# Patient Record
Sex: Female | Born: 1985
Health system: Southern US, Community
[De-identification: ages and names within clinical notes are randomized; demographics above are authoritative.]

## PROBLEM LIST (undated history)

## (undated) ENCOUNTER — Emergency Department (HOSPITAL_COMMUNITY): Admission: EM | Payer: Medicaid Other | Source: Home / Self Care

## (undated) DIAGNOSIS — M549 Dorsalgia, unspecified: Secondary | ICD-10-CM

## (undated) DIAGNOSIS — R339 Retention of urine, unspecified: Secondary | ICD-10-CM

## (undated) DIAGNOSIS — E46 Unspecified protein-calorie malnutrition: Secondary | ICD-10-CM

## (undated) DIAGNOSIS — R519 Headache, unspecified: Secondary | ICD-10-CM

## (undated) DIAGNOSIS — D62 Acute posthemorrhagic anemia: Secondary | ICD-10-CM

## (undated) DIAGNOSIS — F909 Attention-deficit hyperactivity disorder, unspecified type: Secondary | ICD-10-CM

## (undated) DIAGNOSIS — K5903 Drug induced constipation: Secondary | ICD-10-CM

## (undated) DIAGNOSIS — J3081 Allergic rhinitis due to animal (cat) (dog) hair and dander: Secondary | ICD-10-CM

## (undated) DIAGNOSIS — S32402A Unspecified fracture of left acetabulum, initial encounter for closed fracture: Secondary | ICD-10-CM

## (undated) DIAGNOSIS — G5772 Causalgia of left lower limb: Secondary | ICD-10-CM

## (undated) DIAGNOSIS — F419 Anxiety disorder, unspecified: Secondary | ICD-10-CM

## (undated) DIAGNOSIS — F319 Bipolar disorder, unspecified: Secondary | ICD-10-CM

## (undated) DIAGNOSIS — S2249XA Multiple fractures of ribs, unspecified side, initial encounter for closed fracture: Secondary | ICD-10-CM

## (undated) DIAGNOSIS — K219 Gastro-esophageal reflux disease without esophagitis: Secondary | ICD-10-CM

## (undated) DIAGNOSIS — G8918 Other acute postprocedural pain: Secondary | ICD-10-CM

## (undated) DIAGNOSIS — J189 Pneumonia, unspecified organism: Secondary | ICD-10-CM

## (undated) DIAGNOSIS — T07XXXA Unspecified multiple injuries, initial encounter: Secondary | ICD-10-CM

## (undated) DIAGNOSIS — T148XXA Other injury of unspecified body region, initial encounter: Secondary | ICD-10-CM

## (undated) DIAGNOSIS — S92014A Nondisplaced fracture of body of right calcaneus, initial encounter for closed fracture: Secondary | ICD-10-CM

## (undated) DIAGNOSIS — E8809 Other disorders of plasma-protein metabolism, not elsewhere classified: Secondary | ICD-10-CM

## (undated) DIAGNOSIS — S52009B Unspecified fracture of upper end of unspecified ulna, initial encounter for open fracture type I or II: Secondary | ICD-10-CM

## (undated) DIAGNOSIS — G8929 Other chronic pain: Secondary | ICD-10-CM

## (undated) DIAGNOSIS — G90522 Complex regional pain syndrome I of left lower limb: Secondary | ICD-10-CM

## (undated) DIAGNOSIS — J302 Other seasonal allergic rhinitis: Secondary | ICD-10-CM

## (undated) DIAGNOSIS — S52232A Displaced oblique fracture of shaft of left ulna, initial encounter for closed fracture: Secondary | ICD-10-CM

## (undated) DIAGNOSIS — M792 Neuralgia and neuritis, unspecified: Secondary | ICD-10-CM

## (undated) DIAGNOSIS — M1990 Unspecified osteoarthritis, unspecified site: Secondary | ICD-10-CM

## (undated) DIAGNOSIS — R51 Headache: Secondary | ICD-10-CM

## (undated) HISTORY — DX: Unspecified multiple injuries, initial encounter: T07.XXXA

## (undated) HISTORY — DX: Attention-deficit hyperactivity disorder, unspecified type: F90.9

## (undated) HISTORY — DX: Causalgia of left lower limb: G57.72

## (undated) HISTORY — DX: Complex regional pain syndrome i of left lower limb: G90.522

## (undated) HISTORY — DX: Multiple fractures of ribs, unspecified side, initial encounter for closed fracture: S22.49XA

## (undated) HISTORY — DX: Acute posthemorrhagic anemia: D62

## (undated) HISTORY — DX: Unspecified fracture of left acetabulum, initial encounter for closed fracture: S32.402A

## (undated) HISTORY — DX: Other acute postprocedural pain: G89.18

## (undated) HISTORY — DX: Drug induced constipation: K59.03

## (undated) HISTORY — DX: Unspecified fracture of upper end of unspecified ulna, initial encounter for open fracture type I or II: S52.009B

## (undated) HISTORY — DX: Neuralgia and neuritis, unspecified: M79.2

## (undated) HISTORY — DX: Other disorders of plasma-protein metabolism, not elsewhere classified: E88.09

## (undated) HISTORY — DX: Unspecified protein-calorie malnutrition: E46

## (undated) HISTORY — DX: Other injury of unspecified body region, initial encounter: T14.8XXA

## (undated) HISTORY — DX: Nondisplaced fracture of body of right calcaneus, initial encounter for closed fracture: S92.014A

## (undated) HISTORY — DX: Retention of urine, unspecified: R33.9

## (undated) HISTORY — DX: Displaced oblique fracture of shaft of left ulna, initial encounter for closed fracture: S52.232A

---
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
## 2003-01-11 ENCOUNTER — Inpatient Hospital Stay (HOSPITAL_COMMUNITY): Admission: AD | Admit: 2003-01-11 | Discharge: 2003-01-11 | Payer: Self-pay | Admitting: *Deleted

## 2003-01-15 ENCOUNTER — Inpatient Hospital Stay (HOSPITAL_COMMUNITY): Admission: AD | Admit: 2003-01-15 | Discharge: 2003-01-15 | Payer: Self-pay | Admitting: Family Medicine

## 2003-01-15 ENCOUNTER — Ambulatory Visit (HOSPITAL_COMMUNITY): Admission: RE | Admit: 2003-01-15 | Discharge: 2003-01-15 | Payer: Self-pay | Admitting: Family Medicine

## 2003-01-15 ENCOUNTER — Encounter: Payer: Self-pay | Admitting: Family Medicine

## 2003-01-16 ENCOUNTER — Ambulatory Visit (HOSPITAL_COMMUNITY): Admission: RE | Admit: 2003-01-16 | Discharge: 2003-01-16 | Payer: Self-pay | Admitting: *Deleted

## 2003-02-28 ENCOUNTER — Inpatient Hospital Stay (HOSPITAL_COMMUNITY): Admission: AD | Admit: 2003-02-28 | Discharge: 2003-02-28 | Payer: Self-pay | Admitting: Obstetrics and Gynecology

## 2003-03-01 ENCOUNTER — Encounter: Admission: RE | Admit: 2003-03-01 | Discharge: 2003-03-01 | Payer: Self-pay | Admitting: *Deleted

## 2003-03-01 ENCOUNTER — Ambulatory Visit (HOSPITAL_COMMUNITY): Admission: RE | Admit: 2003-03-01 | Discharge: 2003-03-01 | Payer: Self-pay | Admitting: *Deleted

## 2003-03-18 ENCOUNTER — Inpatient Hospital Stay (HOSPITAL_COMMUNITY): Admission: AD | Admit: 2003-03-18 | Discharge: 2003-03-18 | Payer: Self-pay | Admitting: *Deleted

## 2003-03-21 ENCOUNTER — Encounter: Admission: RE | Admit: 2003-03-21 | Discharge: 2003-03-21 | Payer: Self-pay | Admitting: *Deleted

## 2003-04-04 ENCOUNTER — Inpatient Hospital Stay (HOSPITAL_COMMUNITY): Admission: AD | Admit: 2003-04-04 | Discharge: 2003-04-04 | Payer: Self-pay | Admitting: Obstetrics and Gynecology

## 2003-04-04 ENCOUNTER — Encounter: Admission: RE | Admit: 2003-04-04 | Discharge: 2003-04-04 | Payer: Self-pay | Admitting: *Deleted

## 2003-04-09 ENCOUNTER — Ambulatory Visit (HOSPITAL_COMMUNITY): Admission: RE | Admit: 2003-04-09 | Discharge: 2003-04-09 | Payer: Self-pay | Admitting: *Deleted

## 2003-04-11 ENCOUNTER — Encounter: Admission: RE | Admit: 2003-04-11 | Discharge: 2003-04-11 | Payer: Self-pay | Admitting: *Deleted

## 2003-04-18 ENCOUNTER — Encounter: Admission: RE | Admit: 2003-04-18 | Discharge: 2003-04-18 | Payer: Self-pay | Admitting: *Deleted

## 2003-04-21 ENCOUNTER — Inpatient Hospital Stay (HOSPITAL_COMMUNITY): Admission: AD | Admit: 2003-04-21 | Discharge: 2003-04-21 | Payer: Self-pay | Admitting: Obstetrics & Gynecology

## 2003-04-25 ENCOUNTER — Encounter: Admission: RE | Admit: 2003-04-25 | Discharge: 2003-04-25 | Payer: Self-pay | Admitting: *Deleted

## 2003-04-25 ENCOUNTER — Ambulatory Visit (HOSPITAL_COMMUNITY): Admission: RE | Admit: 2003-04-25 | Discharge: 2003-04-25 | Payer: Self-pay | Admitting: *Deleted

## 2003-05-03 ENCOUNTER — Encounter: Admission: RE | Admit: 2003-05-03 | Discharge: 2003-05-03 | Payer: Self-pay | Admitting: *Deleted

## 2003-05-10 ENCOUNTER — Encounter: Admission: RE | Admit: 2003-05-10 | Discharge: 2003-05-10 | Payer: Self-pay | Admitting: *Deleted

## 2003-05-11 ENCOUNTER — Inpatient Hospital Stay (HOSPITAL_COMMUNITY): Admission: AD | Admit: 2003-05-11 | Discharge: 2003-05-11 | Payer: Self-pay | Admitting: *Deleted

## 2003-05-16 ENCOUNTER — Inpatient Hospital Stay (HOSPITAL_COMMUNITY): Admission: AD | Admit: 2003-05-16 | Discharge: 2003-05-16 | Payer: Self-pay | Admitting: Obstetrics and Gynecology

## 2003-05-17 ENCOUNTER — Encounter: Admission: RE | Admit: 2003-05-17 | Discharge: 2003-05-17 | Payer: Self-pay | Admitting: Family Medicine

## 2003-05-21 ENCOUNTER — Inpatient Hospital Stay (HOSPITAL_COMMUNITY): Admission: AD | Admit: 2003-05-21 | Discharge: 2003-05-21 | Payer: Self-pay | Admitting: Family Medicine

## 2003-05-23 ENCOUNTER — Encounter: Admission: RE | Admit: 2003-05-23 | Discharge: 2003-05-23 | Payer: Self-pay | Admitting: *Deleted

## 2003-05-30 ENCOUNTER — Encounter: Admission: RE | Admit: 2003-05-30 | Discharge: 2003-05-30 | Payer: Self-pay | Admitting: *Deleted

## 2003-05-31 ENCOUNTER — Inpatient Hospital Stay (HOSPITAL_COMMUNITY): Admission: AD | Admit: 2003-05-31 | Discharge: 2003-06-02 | Payer: Self-pay | Admitting: Obstetrics & Gynecology

## 2003-11-04 ENCOUNTER — Inpatient Hospital Stay (HOSPITAL_COMMUNITY): Admission: AD | Admit: 2003-11-04 | Discharge: 2003-11-04 | Payer: Self-pay | Admitting: Family Medicine

## 2003-12-23 ENCOUNTER — Emergency Department (HOSPITAL_COMMUNITY): Admission: EM | Admit: 2003-12-23 | Discharge: 2003-12-23 | Payer: Self-pay | Admitting: Emergency Medicine

## 2004-01-17 ENCOUNTER — Inpatient Hospital Stay (HOSPITAL_COMMUNITY): Admission: AD | Admit: 2004-01-17 | Discharge: 2004-01-17 | Payer: Self-pay | Admitting: Obstetrics & Gynecology

## 2004-02-21 ENCOUNTER — Emergency Department (HOSPITAL_COMMUNITY): Admission: EM | Admit: 2004-02-21 | Discharge: 2004-02-21 | Payer: Self-pay | Admitting: Family Medicine

## 2004-02-26 ENCOUNTER — Emergency Department (HOSPITAL_COMMUNITY): Admission: EM | Admit: 2004-02-26 | Discharge: 2004-02-26 | Payer: Self-pay | Admitting: Emergency Medicine

## 2004-03-26 ENCOUNTER — Inpatient Hospital Stay (HOSPITAL_COMMUNITY): Admission: AD | Admit: 2004-03-26 | Discharge: 2004-03-27 | Payer: Self-pay | Admitting: *Deleted

## 2004-07-29 ENCOUNTER — Emergency Department (HOSPITAL_COMMUNITY): Admission: EM | Admit: 2004-07-29 | Discharge: 2004-07-29 | Payer: Self-pay | Admitting: Family Medicine

## 2004-08-05 ENCOUNTER — Inpatient Hospital Stay (HOSPITAL_COMMUNITY): Admission: AD | Admit: 2004-08-05 | Discharge: 2004-08-05 | Payer: Self-pay | Admitting: *Deleted

## 2004-10-13 ENCOUNTER — Inpatient Hospital Stay (HOSPITAL_COMMUNITY): Admission: AD | Admit: 2004-10-13 | Discharge: 2004-10-13 | Payer: Self-pay | Admitting: Family Medicine

## 2004-10-15 ENCOUNTER — Emergency Department (HOSPITAL_COMMUNITY): Admission: EM | Admit: 2004-10-15 | Discharge: 2004-10-16 | Payer: Self-pay | Admitting: Emergency Medicine

## 2005-01-17 ENCOUNTER — Emergency Department (HOSPITAL_COMMUNITY): Admission: EM | Admit: 2005-01-17 | Discharge: 2005-01-17 | Payer: Self-pay | Admitting: Emergency Medicine

## 2005-04-09 ENCOUNTER — Emergency Department (HOSPITAL_COMMUNITY): Admission: EM | Admit: 2005-04-09 | Discharge: 2005-04-09 | Payer: Self-pay | Admitting: Emergency Medicine

## 2005-07-19 ENCOUNTER — Emergency Department (HOSPITAL_COMMUNITY): Admission: EM | Admit: 2005-07-19 | Discharge: 2005-07-19 | Payer: Self-pay | Admitting: Emergency Medicine

## 2005-08-03 ENCOUNTER — Inpatient Hospital Stay (HOSPITAL_COMMUNITY): Admission: AD | Admit: 2005-08-03 | Discharge: 2005-08-03 | Payer: Self-pay | Admitting: *Deleted

## 2005-09-09 ENCOUNTER — Inpatient Hospital Stay (HOSPITAL_COMMUNITY): Admission: AD | Admit: 2005-09-09 | Discharge: 2005-09-09 | Payer: Self-pay | Admitting: Gynecology

## 2005-10-27 ENCOUNTER — Inpatient Hospital Stay (HOSPITAL_COMMUNITY): Admission: AD | Admit: 2005-10-27 | Discharge: 2005-10-27 | Payer: Self-pay | Admitting: Obstetrics and Gynecology

## 2005-10-31 ENCOUNTER — Inpatient Hospital Stay (HOSPITAL_COMMUNITY): Admission: AD | Admit: 2005-10-31 | Discharge: 2005-10-31 | Payer: Self-pay | Admitting: Gynecology

## 2005-12-02 ENCOUNTER — Inpatient Hospital Stay (HOSPITAL_COMMUNITY): Admission: AD | Admit: 2005-12-02 | Discharge: 2005-12-02 | Payer: Self-pay | Admitting: Gynecology

## 2006-02-12 ENCOUNTER — Inpatient Hospital Stay (HOSPITAL_COMMUNITY): Admission: AD | Admit: 2006-02-12 | Discharge: 2006-02-12 | Payer: Self-pay | Admitting: Obstetrics and Gynecology

## 2006-05-06 ENCOUNTER — Inpatient Hospital Stay (HOSPITAL_COMMUNITY): Admission: AD | Admit: 2006-05-06 | Discharge: 2006-05-06 | Payer: Self-pay | Admitting: Obstetrics & Gynecology

## 2006-05-29 ENCOUNTER — Emergency Department (HOSPITAL_COMMUNITY): Admission: EM | Admit: 2006-05-29 | Discharge: 2006-05-29 | Payer: Self-pay | Admitting: Emergency Medicine

## 2006-08-13 ENCOUNTER — Emergency Department (HOSPITAL_COMMUNITY): Admission: EM | Admit: 2006-08-13 | Discharge: 2006-08-13 | Payer: Self-pay | Admitting: Emergency Medicine

## 2006-09-10 ENCOUNTER — Encounter: Admission: RE | Admit: 2006-09-10 | Discharge: 2006-09-10 | Payer: Self-pay | Admitting: Orthopedic Surgery

## 2006-10-21 ENCOUNTER — Inpatient Hospital Stay (HOSPITAL_COMMUNITY): Admission: AD | Admit: 2006-10-21 | Discharge: 2006-10-21 | Payer: Self-pay | Admitting: Obstetrics & Gynecology

## 2007-04-20 ENCOUNTER — Emergency Department (HOSPITAL_COMMUNITY): Admission: EM | Admit: 2007-04-20 | Discharge: 2007-04-20 | Payer: Self-pay | Admitting: Emergency Medicine

## 2007-06-21 ENCOUNTER — Emergency Department (HOSPITAL_COMMUNITY): Admission: EM | Admit: 2007-06-21 | Discharge: 2007-06-21 | Payer: Self-pay | Admitting: Emergency Medicine

## 2007-07-23 ENCOUNTER — Emergency Department (HOSPITAL_COMMUNITY): Admission: EM | Admit: 2007-07-23 | Discharge: 2007-07-23 | Payer: Self-pay | Admitting: Emergency Medicine

## 2007-07-28 ENCOUNTER — Inpatient Hospital Stay (HOSPITAL_COMMUNITY): Admission: AD | Admit: 2007-07-28 | Discharge: 2007-07-28 | Payer: Self-pay | Admitting: Obstetrics and Gynecology

## 2007-08-08 ENCOUNTER — Emergency Department (HOSPITAL_COMMUNITY): Admission: EM | Admit: 2007-08-08 | Discharge: 2007-08-08 | Payer: Self-pay | Admitting: Emergency Medicine

## 2007-08-15 ENCOUNTER — Inpatient Hospital Stay (HOSPITAL_COMMUNITY): Admission: AD | Admit: 2007-08-15 | Discharge: 2007-08-15 | Payer: Self-pay | Admitting: Obstetrics and Gynecology

## 2007-08-24 ENCOUNTER — Emergency Department (HOSPITAL_COMMUNITY): Admission: EM | Admit: 2007-08-24 | Discharge: 2007-08-24 | Payer: Self-pay | Admitting: Emergency Medicine

## 2007-10-16 ENCOUNTER — Inpatient Hospital Stay (HOSPITAL_COMMUNITY): Admission: AD | Admit: 2007-10-16 | Discharge: 2007-10-16 | Payer: Self-pay | Admitting: Obstetrics & Gynecology

## 2007-10-26 ENCOUNTER — Ambulatory Visit (HOSPITAL_COMMUNITY): Admission: RE | Admit: 2007-10-26 | Discharge: 2007-10-26 | Payer: Self-pay | Admitting: Obstetrics & Gynecology

## 2007-11-10 ENCOUNTER — Ambulatory Visit (HOSPITAL_COMMUNITY): Admission: RE | Admit: 2007-11-10 | Discharge: 2007-11-10 | Payer: Self-pay | Admitting: Obstetrics & Gynecology

## 2007-12-23 ENCOUNTER — Inpatient Hospital Stay (HOSPITAL_COMMUNITY): Admission: AD | Admit: 2007-12-23 | Discharge: 2007-12-23 | Payer: Self-pay | Admitting: Psychiatry

## 2007-12-27 ENCOUNTER — Inpatient Hospital Stay (HOSPITAL_COMMUNITY): Admission: AD | Admit: 2007-12-27 | Discharge: 2007-12-28 | Payer: Self-pay | Admitting: Obstetrics

## 2008-01-12 ENCOUNTER — Other Ambulatory Visit: Payer: Self-pay | Admitting: Emergency Medicine

## 2008-01-13 ENCOUNTER — Inpatient Hospital Stay (HOSPITAL_COMMUNITY): Admission: AD | Admit: 2008-01-13 | Discharge: 2008-01-19 | Payer: Self-pay | Admitting: Obstetrics

## 2008-03-07 ENCOUNTER — Inpatient Hospital Stay (HOSPITAL_COMMUNITY): Admission: AD | Admit: 2008-03-07 | Discharge: 2008-03-07 | Payer: Self-pay | Admitting: Obstetrics

## 2008-03-15 ENCOUNTER — Inpatient Hospital Stay (HOSPITAL_COMMUNITY): Admission: AD | Admit: 2008-03-15 | Discharge: 2008-03-15 | Payer: Self-pay | Admitting: Obstetrics

## 2008-03-20 ENCOUNTER — Inpatient Hospital Stay (HOSPITAL_COMMUNITY): Admission: AD | Admit: 2008-03-20 | Discharge: 2008-03-23 | Payer: Self-pay | Admitting: Obstetrics

## 2008-03-29 ENCOUNTER — Emergency Department (HOSPITAL_COMMUNITY): Admission: EM | Admit: 2008-03-29 | Discharge: 2008-03-29 | Payer: Self-pay | Admitting: Emergency Medicine

## 2008-04-06 ENCOUNTER — Emergency Department (HOSPITAL_COMMUNITY): Admission: EM | Admit: 2008-04-06 | Discharge: 2008-04-06 | Payer: Self-pay | Admitting: Emergency Medicine

## 2008-04-07 ENCOUNTER — Emergency Department (HOSPITAL_COMMUNITY): Admission: EM | Admit: 2008-04-07 | Discharge: 2008-04-07 | Payer: Self-pay | Admitting: Emergency Medicine

## 2008-04-13 HISTORY — PX: CHOLECYSTECTOMY: SHX55

## 2008-05-02 ENCOUNTER — Emergency Department (HOSPITAL_COMMUNITY): Admission: EM | Admit: 2008-05-02 | Discharge: 2008-05-03 | Payer: Self-pay | Admitting: Emergency Medicine

## 2008-05-22 ENCOUNTER — Encounter (INDEPENDENT_AMBULATORY_CARE_PROVIDER_SITE_OTHER): Payer: Self-pay | Admitting: General Surgery

## 2008-05-22 ENCOUNTER — Ambulatory Visit (HOSPITAL_COMMUNITY): Admission: RE | Admit: 2008-05-22 | Discharge: 2008-05-23 | Payer: Self-pay | Admitting: General Surgery

## 2008-07-31 ENCOUNTER — Ambulatory Visit (HOSPITAL_COMMUNITY): Admission: RE | Admit: 2008-07-31 | Discharge: 2008-07-31 | Payer: Self-pay | Admitting: Obstetrics

## 2008-10-21 ENCOUNTER — Emergency Department (HOSPITAL_COMMUNITY): Admission: EM | Admit: 2008-10-21 | Discharge: 2008-10-21 | Payer: Self-pay | Admitting: Emergency Medicine

## 2008-11-17 ENCOUNTER — Emergency Department (HOSPITAL_COMMUNITY): Admission: EM | Admit: 2008-11-17 | Discharge: 2008-11-17 | Payer: Self-pay | Admitting: Emergency Medicine

## 2008-12-20 ENCOUNTER — Emergency Department (HOSPITAL_COMMUNITY): Admission: EM | Admit: 2008-12-20 | Discharge: 2008-12-20 | Payer: Self-pay | Admitting: Emergency Medicine

## 2009-01-10 ENCOUNTER — Inpatient Hospital Stay (HOSPITAL_COMMUNITY): Admission: AD | Admit: 2009-01-10 | Discharge: 2009-01-10 | Payer: Self-pay | Admitting: Psychiatry

## 2009-02-21 ENCOUNTER — Emergency Department (HOSPITAL_COMMUNITY): Admission: EM | Admit: 2009-02-21 | Discharge: 2009-02-21 | Payer: Self-pay | Admitting: Emergency Medicine

## 2009-02-24 ENCOUNTER — Emergency Department (HOSPITAL_COMMUNITY): Admission: EM | Admit: 2009-02-24 | Discharge: 2009-02-24 | Payer: Self-pay | Admitting: Emergency Medicine

## 2009-09-28 ENCOUNTER — Emergency Department (HOSPITAL_COMMUNITY): Admission: EM | Admit: 2009-09-28 | Discharge: 2009-09-28 | Payer: Self-pay | Admitting: Emergency Medicine

## 2010-05-04 ENCOUNTER — Encounter: Payer: Self-pay | Admitting: Orthopedic Surgery

## 2010-05-04 ENCOUNTER — Encounter: Payer: Self-pay | Admitting: Obstetrics

## 2010-05-05 ENCOUNTER — Encounter: Payer: Self-pay | Admitting: Obstetrics

## 2010-05-27 ENCOUNTER — Emergency Department (HOSPITAL_COMMUNITY): Payer: Medicaid Other

## 2010-05-27 ENCOUNTER — Emergency Department (HOSPITAL_COMMUNITY)
Admission: EM | Admit: 2010-05-27 | Discharge: 2010-05-28 | Disposition: A | Discharge status: Home / Self Care | Payer: Medicaid Other | Attending: Emergency Medicine | Admitting: Emergency Medicine

## 2010-05-27 DIAGNOSIS — F17200 Nicotine dependence, unspecified, uncomplicated: Secondary | ICD-10-CM | POA: Insufficient documentation

## 2010-05-27 DIAGNOSIS — J40 Bronchitis, not specified as acute or chronic: Secondary | ICD-10-CM | POA: Insufficient documentation

## 2010-05-27 DIAGNOSIS — R0602 Shortness of breath: Secondary | ICD-10-CM | POA: Insufficient documentation

## 2010-06-29 LAB — POCT PREGNANCY, URINE: Preg Test, Ur: NEGATIVE

## 2010-06-29 LAB — CBC
HCT: 42.7 % (ref 36.0–46.0)
Hemoglobin: 14.7 g/dL (ref 12.0–15.0)
MCHC: 34.4 g/dL (ref 30.0–36.0)
MCV: 91.4 fL (ref 78.0–100.0)
Platelets: 290 10*3/uL (ref 150–400)
RBC: 4.67 MIL/uL (ref 3.87–5.11)
RDW: 13.7 % (ref 11.5–15.5)
WBC: 5.8 10*3/uL (ref 4.0–10.5)

## 2010-06-29 LAB — URINALYSIS, ROUTINE W REFLEX MICROSCOPIC
Bilirubin Urine: NEGATIVE
Bilirubin Urine: NEGATIVE
Glucose, UA: NEGATIVE mg/dL
Glucose, UA: NEGATIVE mg/dL
Hgb urine dipstick: NEGATIVE
Ketones, ur: NEGATIVE mg/dL
Ketones, ur: NEGATIVE mg/dL
Nitrite: NEGATIVE
Nitrite: NEGATIVE
Protein, ur: NEGATIVE mg/dL
Protein, ur: NEGATIVE mg/dL
Specific Gravity, Urine: 1.024 (ref 1.005–1.030)
Specific Gravity, Urine: 1.031 — ABNORMAL HIGH (ref 1.005–1.030)
Urobilinogen, UA: 0.2 mg/dL (ref 0.0–1.0)
Urobilinogen, UA: 1 mg/dL (ref 0.0–1.0)
pH: 5.5 (ref 5.0–8.0)
pH: 6 (ref 5.0–8.0)

## 2010-06-29 LAB — COMPREHENSIVE METABOLIC PANEL
ALT: 24 U/L (ref 0–35)
AST: 31 U/L (ref 0–37)
Albumin: 4 g/dL (ref 3.5–5.2)
Alkaline Phosphatase: 64 U/L (ref 39–117)
BUN: 12 mg/dL (ref 6–23)
CO2: 23 mEq/L (ref 19–32)
Calcium: 9.3 mg/dL (ref 8.4–10.5)
Chloride: 110 mEq/L (ref 96–112)
Creatinine, Ser: 0.75 mg/dL (ref 0.4–1.2)
GFR calc Af Amer: >60 mL/min (ref >60)
GFR calc non Af Amer: >60 mL/min (ref >60)
Glucose, Bld: 115 mg/dL — ABNORMAL HIGH (ref 70–99)
Potassium: 3.9 mEq/L (ref 3.5–5.1)
Sodium: 141 mEq/L (ref 135–145)
Total Bilirubin: 0.5 mg/dL (ref 0.3–1.2)
Total Protein: 7.4 g/dL (ref 6.0–8.3)

## 2010-06-29 LAB — GC/CHLAMYDIA PROBE AMP, GENITAL
Chlamydia, DNA Probe: NEGATIVE
GC Probe Amp, Genital: NEGATIVE

## 2010-06-29 LAB — URINE MICROSCOPIC-ADD ON

## 2010-06-29 LAB — WET PREP, GENITAL
Trich, Wet Prep: NONE SEEN
WBC, Wet Prep HPF POC: NONE SEEN
Yeast Wet Prep HPF POC: NONE SEEN

## 2010-06-29 LAB — DIFFERENTIAL
Basophils Absolute: 0 10*3/uL (ref 0.0–0.1)
Basophils Relative: 0 % (ref 0–1)
Eosinophils Absolute: 0.2 10*3/uL (ref 0.0–0.7)
Eosinophils Relative: 3 % (ref 0–5)
Lymphocytes Relative: 27 % (ref 12–46)
Lymphs Abs: 1.6 10*3/uL (ref 0.7–4.0)
Monocytes Absolute: 0.6 10*3/uL (ref 0.1–1.0)
Monocytes Relative: 10 % (ref 3–12)
Neutro Abs: 3.5 10*3/uL (ref 1.7–7.7)
Neutrophils Relative %: 60 % (ref 43–77)

## 2010-07-16 LAB — RAPID STREP SCREEN (MED CTR MEBANE ONLY)
Streptococcus, Group A Screen (Direct): NEGATIVE
Streptococcus, Group A Screen (Direct): NEGATIVE

## 2010-07-18 LAB — URINALYSIS, ROUTINE W REFLEX MICROSCOPIC
Bilirubin Urine: NEGATIVE
Glucose, UA: NEGATIVE mg/dL
Hgb urine dipstick: NEGATIVE
Ketones, ur: NEGATIVE mg/dL
Nitrite: NEGATIVE
Protein, ur: NEGATIVE mg/dL
Specific Gravity, Urine: >1.03 — ABNORMAL HIGH (ref 1.005–1.030)
Urobilinogen, UA: 0.2 mg/dL (ref 0.0–1.0)
pH: 5 (ref 5.0–8.0)

## 2010-07-18 LAB — GC/CHLAMYDIA PROBE AMP, GENITAL
Chlamydia, DNA Probe: NEGATIVE
GC Probe Amp, Genital: NEGATIVE

## 2010-07-18 LAB — CBC
HCT: 43.1 % (ref 36.0–46.0)
Hemoglobin: 14.6 g/dL (ref 12.0–15.0)
MCHC: 33.9 g/dL (ref 30.0–36.0)
MCV: 92.3 fL (ref 78.0–100.0)
Platelets: 268 10*3/uL (ref 150–400)
RBC: 4.67 MIL/uL (ref 3.87–5.11)
RDW: 13.5 % (ref 11.5–15.5)
WBC: 6.2 10*3/uL (ref 4.0–10.5)

## 2010-07-18 LAB — WET PREP, GENITAL
Clue Cells Wet Prep HPF POC: NONE SEEN
Trich, Wet Prep: NONE SEEN
Yeast Wet Prep HPF POC: NONE SEEN

## 2010-07-18 LAB — POCT PREGNANCY, URINE: Preg Test, Ur: NEGATIVE

## 2010-07-28 LAB — URINALYSIS, ROUTINE W REFLEX MICROSCOPIC
Bilirubin Urine: NEGATIVE
Glucose, UA: NEGATIVE mg/dL
Hgb urine dipstick: NEGATIVE
Ketones, ur: NEGATIVE mg/dL
Nitrite: NEGATIVE
Protein, ur: NEGATIVE mg/dL
Specific Gravity, Urine: 1.02 (ref 1.005–1.030)
Urobilinogen, UA: 0.2 mg/dL (ref 0.0–1.0)
pH: 6 (ref 5.0–8.0)

## 2010-07-28 LAB — CBC
HCT: 42.7 % (ref 36.0–46.0)
Hemoglobin: 14 g/dL (ref 12.0–15.0)
MCHC: 32.8 g/dL (ref 30.0–36.0)
MCV: 89.2 fL (ref 78.0–100.0)
Platelets: 316 10*3/uL (ref 150–400)
RBC: 4.78 MIL/uL (ref 3.87–5.11)
RDW: 13.9 % (ref 11.5–15.5)
WBC: 8.6 10*3/uL (ref 4.0–10.5)

## 2010-07-28 LAB — DIFFERENTIAL
Basophils Absolute: 0 10*3/uL (ref 0.0–0.1)
Basophils Relative: 0 % (ref 0–1)
Eosinophils Absolute: 0.5 10*3/uL (ref 0.0–0.7)
Eosinophils Relative: 6 % — ABNORMAL HIGH (ref 0–5)
Lymphocytes Relative: 37 % (ref 12–46)
Lymphs Abs: 3.2 10*3/uL (ref 0.7–4.0)
Monocytes Absolute: 0.8 10*3/uL (ref 0.1–1.0)
Monocytes Relative: 10 % (ref 3–12)
Neutro Abs: 4 10*3/uL (ref 1.7–7.7)
Neutrophils Relative %: 47 % (ref 43–77)

## 2010-07-28 LAB — COMPREHENSIVE METABOLIC PANEL
ALT: 77 U/L — ABNORMAL HIGH (ref 0–35)
AST: 51 U/L — ABNORMAL HIGH (ref 0–37)
Albumin: 4 g/dL (ref 3.5–5.2)
Alkaline Phosphatase: 61 U/L (ref 39–117)
BUN: 15 mg/dL (ref 6–23)
CO2: 25 mEq/L (ref 19–32)
Calcium: 9.8 mg/dL (ref 8.4–10.5)
Chloride: 110 mEq/L (ref 96–112)
Creatinine, Ser: 0.8 mg/dL (ref 0.4–1.2)
GFR calc Af Amer: >60 mL/min (ref >60)
GFR calc non Af Amer: >60 mL/min (ref >60)
Glucose, Bld: 93 mg/dL (ref 70–99)
Potassium: 4.2 mEq/L (ref 3.5–5.1)
Sodium: 141 mEq/L (ref 135–145)
Total Bilirubin: 0.4 mg/dL (ref 0.3–1.2)
Total Protein: 7.4 g/dL (ref 6.0–8.3)

## 2010-07-28 LAB — URINE CULTURE
Colony Count: NO GROWTH
Culture: NO GROWTH

## 2010-07-28 LAB — URINE MICROSCOPIC-ADD ON

## 2010-07-28 LAB — LIPASE, BLOOD: Lipase: 28 U/L (ref 11–59)

## 2010-07-29 LAB — HEMOGLOBIN AND HEMATOCRIT, BLOOD
HCT: 40 % (ref 36.0–46.0)
Hemoglobin: 13.7 g/dL (ref 12.0–15.0)

## 2010-07-29 LAB — PREGNANCY, URINE: Preg Test, Ur: NEGATIVE

## 2010-08-26 NOTE — Op Note (Signed)
NAMELAURELLA, Marissa Smith                 ACCOUNT NO.:  [id number]   MEDICAL RECORD NO.:  [id number]          PATIENT TYPE:  AMB   LOCATION:  DAY                          FACILITY:  [facility]   PHYSICIAN:  Ollen Gross. Vernell Morgans, M.D. DATE OF BIRTH:  11/19/1985   DATE OF PROCEDURE:  05/22/2008  DATE OF DISCHARGE:                               OPERATIVE REPORT   PREOPERATIVE DIAGNOSES:  Gallstones.   POSTOPERATIVE DIAGNOSIS:  Gallstones.   PROCEDURES:  Laparoscopic cholecystectomy with intraoperative  cholangiogram.   SURGEON:  Ollen Gross. Vernell Morgans, M.D.   ASSISTANT:  Anselm Pancoast. Zachery Dakins, M.D.   ANESTHESIA:  General endotracheal.   PROCEDURE:  After informed consent was obtained, the patient was brought  to the operating room and placed in supine position on the operating  table.  After adequate induction of general anesthesia, the patient's  abdomen was prepped with Betadine and draped in usual sterile manner.  The area above the umbilicus was infiltrated with 0.25% Marcaine.  A  small incision was made with 15 blade knife.  This incision was carried  down through the subcutaneous tissue bluntly with a hemostat and [employer] retractors until the linea alba was identified.  The linea alba was  incised with a 15 blade knife and each side was grasped with Kocher  clamps and elevated anteriorly.  The preperitoneal space was then probed  bluntly with a hemostat until the peritoneum was opened and access was  gained to the abdominal cavity.  The 0 Vicryl pursestring stitch was  placed in the fascia surrounding the opening.  The Hasson cannula was  placed through the opening and anchored in place with the previously  placed Vicryl pursestring stitch.  The abdomen was then insufflated with  carbon dioxide without difficulty.  The patient was placed in reverse  Trendelenburg position and rotated with the right side up.  A  laparoscope was inserted through the Hasson cannula and the right upper  quadrant was inspected. The dome of gallbladder and liver readily  identified.  Next, the epigastric region was infiltrated with 0.25%  Marcaine.  A small incision was made with a 15 blade knife.  A 10-mm  port was placed bluntly through this incision into the abdominal cavity  under direct vision.  Sites were then chosen laterally on the right side  of the abdomen for placement of 5-mm ports.  Each of these areas was  infiltrated with 0.25% Marcaine.  Small stab incisions were made with a  15 blade knife and 5 mm ports were placed bluntly through these  incisions into the abdominal cavity under direct vision.  A blunt  grasper was placed through the lateral-most 5-mm port and used to grasp  the dome of gallbladder and elevate it anteriorly and superiorly.  Another blunt grasper was placed through the other 5-mm port and used to  retract on the body and neck of the gallbladder.  A dissector was placed  through the epigastric port and using electrocautery, the peritoneal  reflection at the gallbladder neck area was opened.  Blunt dissection  was  then carried out in this area until the gallbladder neck cystic duct  junction was readily identified and a good window was created.  A single  clip was placed on the gallbladder neck.  A small ductotomy was made  just below the clip with a laparoscopic scissors.  A 14 gauge angiocath  was placed percutaneously through the anterior abdominal wall under  direct vision.  A Reddick cholangiogram catheter was placed through the  angiocath and flushed.  The Reddick catheter then placed in the cystic  duct and anchored in place with the clip.  A cholangiogram was obtained  that showed no filling defects, good emptying in duodenum and adequate  length on the cystic duct.  The anchoring clip and catheters were  removed from the patient.  Three clips were placed proximally on the  cystic duct and duct was divided between the two.  Next, posterior to  this,  the cystic artery was identified with a couple of branch points.  These were all dissected bluntly in a circumferential manner until a  good window was created.  Two clips placed proximally and distally on  each of the branches and each was divided between two sets of clips.  Next a laparoscopic hook cautery device was used to separate the  gallbladder from liver bed.  Prior to completely detaching the  gallbladder from the liver bed, the liver bed was inspected and several  small  bleeding points were coagulated with electrocautery until the  area was completely hemostatic.  Gallbladder then detached the rest of  the way from liver bed without difficulty with a laparoscopic hook  cautery device.  A laparoscopic bag was then inserted through the  epigastric port.  The gallbladder was placed in the bag and bag was  sealed.  The abdomen was then irrigated with copious amounts of saline.  The liver bed was inspected again and looked good.  The laparoscope was  then moved to the epigastric port.  A gallbladder grasper was placed  through the Hasson cannula and used to grasp the opening of the bag.  The bag with the gallbladder was removed with the Hasson cannula through  the supraumbilical port without difficulty.  The fascial defect was  closed with the previously placed Vicryl pursestring stitch as well as  another figure-of-eight 0 Vicryl stitch.  The rest of the ports were  then removed under direct vision and were found to be hemostatic.  Gas  was allowed to escape.  The skin incisions were all closed with  interrupted 4-0 Monocryl subcuticular stitches.  Dermabond dressings  were applied.  The patient tolerated the procedure well.  At the end of  the case all needle, sponge and instrument counts were correct.  The  patient was awakened, taken to recovery room in stable condition.      Ollen Gross. Vernell Morgans, M.D.  Electronically Signed     PST/MEDQ  D:  05/22/2008  T:  05/22/2008  Job:   [id number]

## 2010-08-26 NOTE — Discharge Summary (Signed)
NAMEMALLISA, Marissa Smith                 ACCOUNT NO.:  [id number]   MEDICAL RECORD NO.:  [id number]          PATIENT TYPE:  INP   LOCATION:  9308                          FACILITY:  [facility]   PHYSICIAN:  Charles A. Clearance Coots, M.D.DATE OF BIRTH:  [birth year]   DATE OF ADMISSION:  01/13/2008  DATE OF DISCHARGE:  01/19/2008                               DISCHARGE SUMMARY   ADMITTING DIAGNOSES:  1. 30-week gestation.  2. Severe upper respiratory tract infection.  3. Possible pneumonia.   DISCHARGE DIAGNOSES:  1. 30-week gestation.  2. Severe upper respiratory tract infection.  3. Possible pneumonia.  4. Left lower lobe pneumonia.   Discharged home undelivered at [redacted] weeks gestation, much improved and  good condition.   REASON FOR ADMISSION:  A 25 year old gravida 4, para 2.  Estimated date  was March 22, 2008, presents with fever, chills, congestive chest  pain.  The patient's children at home had a history of having flu and  the patient had been started on Tamiflu 1 day prior to presentation.   PAST MEDICAL HISTORY:  Illnesses, bipolar disorder.   IMPRESSION:   MEDICATIONS:  Prenatal vitamins, Tamiflu.   SOCIAL HISTORY:  Single.  Positive tobacco, negative alcohol, or  recreational drug use.   FAMILY HISTORY:  No major illnesses.   PHYSICAL EXAMINATION:  GENERAL:  Well-nourished, well-developed female  in moderate distress.  VITAL SIGNS:  Temperature 101.4, pulse 116, respiratory rate 22, blood  pressure 107/60.  LUNGS:  Diffuse wheezes with decreased breath sounds bilaterally.  HEART:  Regular rate and rhythm.  ABDOMEN:  Gravid, nontender.  Fetal heart rate 150-160 beats per minute.   ADMITTING LABORATORIES:  Hemoglobin of 10.7, hematocrit 31, white blood  cell count 5500, platelets 212,000.  Respiratory culture for H1N1 was  sent, results pending.  Respiratory sputum, gram smear revealed no white  blood cells, rare squamous epithelial cells, moderate gram-positive  rods, rare  gram-negative rods.   HOSPITAL COURSE:  The patient was admitted, started on IV antibiotic  therapy with azithromycin 250 mg IV daily and Rocephin 1 g IV q.12 h.,  Tamiflu was continued with 75 mg p.o. b.i.d.  The patient also received  respiratory therapy, pulmonary treatments.  She had very slow progress  initially and then by hospital day #3, she was started to feel better.  Chest x-ray revealed left lower lobe infiltrate suggesting pneumonia.  The patient continued to show improvement and was discharged home on  hospital day #6 much improved and good condition.  H1N1 screen,  positive.   DISCHARGE DISPOSITION:  Medications, continue prenatal vitamins,  continue azithromycin for 10 days, continue Ceftin 500 mg p.o. twice a  day for 10 days.  The patient is to follow up in office in 2 weeks.      Charles A. Clearance Coots, M.D.  Electronically Signed     CAH/MEDQ  D:  01/19/2008  T:  01/19/2008  Job:  [id number]

## 2010-09-09 ENCOUNTER — Inpatient Hospital Stay (INDEPENDENT_AMBULATORY_CARE_PROVIDER_SITE_OTHER)
Admission: RE | Admit: 2010-09-09 | Discharge: 2010-09-09 | Disposition: A | Discharge status: Home / Self Care | Payer: Medicaid Other | Source: Ambulatory Visit | Attending: Family Medicine | Admitting: Family Medicine

## 2010-09-09 DIAGNOSIS — S239XXA Sprain of unspecified parts of thorax, initial encounter: Secondary | ICD-10-CM

## 2010-09-09 DIAGNOSIS — S335XXA Sprain of ligaments of lumbar spine, initial encounter: Secondary | ICD-10-CM

## 2010-12-31 LAB — BASIC METABOLIC PANEL
BUN: 8
CO2: 24
Calcium: 9
Chloride: 104
Creatinine, Ser: 0.83
GFR calc Af Amer: >60
GFR calc non Af Amer: >60
Glucose, Bld: 121 — ABNORMAL HIGH
Potassium: 3.8
Sodium: 135

## 2010-12-31 LAB — WET PREP, GENITAL
Trich, Wet Prep: NONE SEEN
Yeast Wet Prep HPF POC: NONE SEEN

## 2010-12-31 LAB — POCT PREGNANCY, URINE
Operator id: 198171
Preg Test, Ur: NEGATIVE

## 2010-12-31 LAB — CBC
HCT: 38.8
Hemoglobin: 13.5
MCHC: 34.8
MCV: 88.9
Platelets: 258
RBC: 4.36
RDW: 13.7
WBC: 10.5

## 2010-12-31 LAB — URINE CULTURE
Colony Count: NO GROWTH
Culture: NO GROWTH

## 2010-12-31 LAB — DIFFERENTIAL
Basophils Absolute: 0
Basophils Relative: 0
Eosinophils Absolute: 0.2
Eosinophils Relative: 2
Lymphocytes Relative: 14
Lymphs Abs: 1.4
Monocytes Absolute: 0.9
Monocytes Relative: 9
Neutro Abs: 8 — ABNORMAL HIGH
Neutrophils Relative %: 76

## 2010-12-31 LAB — URINALYSIS, ROUTINE W REFLEX MICROSCOPIC
Bilirubin Urine: NEGATIVE
Glucose, UA: NEGATIVE
Hgb urine dipstick: NEGATIVE
Ketones, ur: 15 — AB
Nitrite: NEGATIVE
Protein, ur: NEGATIVE
Specific Gravity, Urine: 1.025
Urobilinogen, UA: 0.2
pH: 5.5

## 2010-12-31 LAB — RPR: RPR Ser Ql: NONREACTIVE

## 2010-12-31 LAB — GC/CHLAMYDIA PROBE AMP, GENITAL
Chlamydia, DNA Probe: NEGATIVE
GC Probe Amp, Genital: NEGATIVE

## 2010-12-31 LAB — URINE MICROSCOPIC-ADD ON

## 2011-01-06 LAB — WET PREP, GENITAL
Clue Cells Wet Prep HPF POC: NONE SEEN
Trich, Wet Prep: NONE SEEN
Yeast Wet Prep HPF POC: NONE SEEN

## 2011-01-06 LAB — DIFFERENTIAL
Basophils Absolute: 0.1
Basophils Relative: 1
Eosinophils Absolute: 0.1
Eosinophils Relative: 2
Lymphocytes Relative: 27
Lymphs Abs: 2
Monocytes Absolute: 0.7
Monocytes Relative: 10
Neutro Abs: 4.5
Neutrophils Relative %: 61

## 2011-01-06 LAB — URINALYSIS, ROUTINE W REFLEX MICROSCOPIC
Bilirubin Urine: NEGATIVE
Glucose, UA: NEGATIVE
Hgb urine dipstick: NEGATIVE
Ketones, ur: NEGATIVE
Nitrite: NEGATIVE
Protein, ur: NEGATIVE
Specific Gravity, Urine: 1.023
Urobilinogen, UA: 0.2
pH: 6.5

## 2011-01-06 LAB — POCT PREGNANCY, URINE
Operator id: 151321
Preg Test, Ur: POSITIVE

## 2011-01-06 LAB — CBC
HCT: 38.8
Hemoglobin: 13.5
MCHC: 34.8
MCV: 90.3
Platelets: 314
RBC: 4.3
RDW: 13.4
WBC: 7.4

## 2011-01-06 LAB — GC/CHLAMYDIA PROBE AMP, GENITAL
Chlamydia, DNA Probe: NEGATIVE
GC Probe Amp, Genital: NEGATIVE

## 2011-01-06 LAB — HCG, QUANTITATIVE, PREGNANCY
hCG, Beta Chain, Quant, S: 1153 — ABNORMAL HIGH
hCG, Beta Chain, Quant, S: 8591 — ABNORMAL HIGH

## 2011-01-06 LAB — RAPID STREP SCREEN (MED CTR MEBANE ONLY): Streptococcus, Group A Screen (Direct): POSITIVE — AB

## 2011-01-06 LAB — ABO/RH: ABO/RH(D): O POS

## 2011-01-07 LAB — URINALYSIS, ROUTINE W REFLEX MICROSCOPIC
Bilirubin Urine: NEGATIVE
Glucose, UA: NEGATIVE
Hgb urine dipstick: NEGATIVE
Ketones, ur: NEGATIVE
Leukocytes, UA: NEGATIVE
Nitrite: POSITIVE — AB
Protein, ur: NEGATIVE
Specific Gravity, Urine: 1.025
Urobilinogen, UA: 0.2
pH: 6

## 2011-01-07 LAB — URINE CULTURE: Colony Count: >=100000

## 2011-01-07 LAB — GC/CHLAMYDIA PROBE AMP, GENITAL
Chlamydia, DNA Probe: NEGATIVE
GC Probe Amp, Genital: NEGATIVE

## 2011-01-07 LAB — CBC
HCT: 38.9
Hemoglobin: 13.7
MCHC: 35.3
MCV: 88.7
Platelets: 310
RBC: 4.39
RDW: 13.3
WBC: 8.9

## 2011-01-07 LAB — URINE MICROSCOPIC-ADD ON

## 2011-01-07 LAB — WET PREP, GENITAL
Clue Cells Wet Prep HPF POC: NONE SEEN
Trich, Wet Prep: NONE SEEN
Yeast Wet Prep HPF POC: NONE SEEN

## 2011-01-07 LAB — ABO/RH: ABO/RH(D): O POS

## 2011-01-08 LAB — CBC
HCT: 37.4
Hemoglobin: 13.1
MCHC: 35
MCV: 92.4
Platelets: 196
RBC: 4.05
RDW: 13.7
WBC: 7

## 2011-01-08 LAB — DIFFERENTIAL
Basophils Absolute: 0
Basophils Relative: 1
Eosinophils Absolute: 0.2
Eosinophils Relative: 3
Lymphocytes Relative: 22
Lymphs Abs: 1.6
Monocytes Absolute: 0.6
Monocytes Relative: 8
Neutro Abs: 4.6
Neutrophils Relative %: 66

## 2011-01-08 LAB — URINALYSIS, ROUTINE W REFLEX MICROSCOPIC
Bilirubin Urine: NEGATIVE
Glucose, UA: NEGATIVE
Hgb urine dipstick: NEGATIVE
Ketones, ur: NEGATIVE
Nitrite: NEGATIVE
Protein, ur: NEGATIVE
Specific Gravity, Urine: 1.015
Urobilinogen, UA: 0.2
pH: 7

## 2011-01-08 LAB — COMPREHENSIVE METABOLIC PANEL
ALT: 27
AST: 30
Albumin: 2.8 — ABNORMAL LOW
Alkaline Phosphatase: 38 — ABNORMAL LOW
BUN: 4 — ABNORMAL LOW
CO2: 23
Calcium: 9.1
Chloride: 107
Creatinine, Ser: 0.47
GFR calc Af Amer: >60
GFR calc non Af Amer: >60
Glucose, Bld: 89
Potassium: 4
Sodium: 135
Total Bilirubin: 0.7
Total Protein: 6

## 2011-01-10 ENCOUNTER — Emergency Department (HOSPITAL_COMMUNITY)
Admission: EM | Admit: 2011-01-10 | Discharge: 2011-01-10 | Disposition: A | Discharge status: Home / Self Care | Payer: Medicaid Other | Attending: Emergency Medicine | Admitting: Emergency Medicine

## 2011-01-10 DIAGNOSIS — K089 Disorder of teeth and supporting structures, unspecified: Secondary | ICD-10-CM | POA: Insufficient documentation

## 2011-01-10 DIAGNOSIS — F3130 Bipolar disorder, current episode depressed, mild or moderate severity, unspecified: Secondary | ICD-10-CM | POA: Insufficient documentation

## 2011-01-10 DIAGNOSIS — R51 Headache: Secondary | ICD-10-CM | POA: Insufficient documentation

## 2011-01-10 DIAGNOSIS — R6884 Jaw pain: Secondary | ICD-10-CM | POA: Insufficient documentation

## 2011-01-12 LAB — URINALYSIS, ROUTINE W REFLEX MICROSCOPIC
Bilirubin Urine: NEGATIVE
Glucose, UA: NEGATIVE
Hgb urine dipstick: NEGATIVE
Ketones, ur: NEGATIVE
Nitrite: NEGATIVE
Protein, ur: NEGATIVE
Specific Gravity, Urine: 1.034 — ABNORMAL HIGH
Urobilinogen, UA: 1
pH: 6

## 2011-01-12 LAB — CULTURE, RESPIRATORY: Gram Stain: NONE SEEN

## 2011-01-12 LAB — CULTURE, RESPIRATORY W GRAM STAIN: Culture: NORMAL

## 2011-01-12 LAB — CBC
HCT: 31.5 — ABNORMAL LOW
Hemoglobin: 10.7 — ABNORMAL LOW
MCHC: 34.1
MCV: 90.9
Platelets: 212
RBC: 3.46 — ABNORMAL LOW
RDW: 14
WBC: 5.5

## 2011-01-12 LAB — H1N1 SCREEN (PCR): H1N1 Virus Scrn: DETECTED

## 2011-01-13 LAB — URINALYSIS, ROUTINE W REFLEX MICROSCOPIC
Bilirubin Urine: NEGATIVE
Glucose, UA: NEGATIVE
Hgb urine dipstick: NEGATIVE
Ketones, ur: NEGATIVE
Nitrite: NEGATIVE
Protein, ur: NEGATIVE
Specific Gravity, Urine: 1.02
Urobilinogen, UA: 0.2
pH: 6

## 2011-01-15 LAB — CBC
HCT: 42.7 % (ref 36.0–46.0)
HCT: 32.8 % — ABNORMAL LOW (ref 36.0–46.0)
HCT: 35.1 % — ABNORMAL LOW (ref 36.0–46.0)
Hemoglobin: 14.4 g/dL (ref 12.0–15.0)
Hemoglobin: 11.2 g/dL — ABNORMAL LOW (ref 12.0–15.0)
Hemoglobin: 12 g/dL (ref 12.0–15.0)
MCHC: 33.7 g/dL (ref 30.0–36.0)
MCHC: 34.1 g/dL (ref 30.0–36.0)
MCHC: 34.2 g/dL (ref 30.0–36.0)
MCV: 90.6 fL (ref 78.0–100.0)
MCV: 91.5 fL (ref 78.0–100.0)
MCV: 92.1 fL (ref 78.0–100.0)
Platelets: 368 10*3/uL (ref 150–400)
Platelets: 139 10*3/uL — ABNORMAL LOW (ref 150–400)
Platelets: 160 10*3/uL (ref 150–400)
RBC: 4.72 MIL/uL (ref 3.87–5.11)
RBC: 3.59 MIL/uL — ABNORMAL LOW (ref 3.87–5.11)
RBC: 3.81 MIL/uL — ABNORMAL LOW (ref 3.87–5.11)
RDW: 13.9 % (ref 11.5–15.5)
RDW: 14.3 % (ref 11.5–15.5)
RDW: 14.4 % (ref 11.5–15.5)
WBC: 7.5 10*3/uL (ref 4.0–10.5)
WBC: 10 10*3/uL (ref 4.0–10.5)
WBC: 10.8 10*3/uL — ABNORMAL HIGH (ref 4.0–10.5)

## 2011-01-15 LAB — COMPREHENSIVE METABOLIC PANEL
ALT: 56 U/L — ABNORMAL HIGH (ref 0–35)
AST: 39 U/L — ABNORMAL HIGH (ref 0–37)
Albumin: 3.6 g/dL (ref 3.5–5.2)
Alkaline Phosphatase: 71 U/L (ref 39–117)
BUN: 17 mg/dL (ref 6–23)
CO2: 23 mEq/L (ref 19–32)
Calcium: 9.2 mg/dL (ref 8.4–10.5)
Chloride: 104 mEq/L (ref 96–112)
Creatinine, Ser: 0.77 mg/dL (ref 0.4–1.2)
GFR calc Af Amer: >60 mL/min (ref >60)
GFR calc non Af Amer: >60 mL/min (ref >60)
Glucose, Bld: 100 mg/dL — ABNORMAL HIGH (ref 70–99)
Potassium: 4.1 mEq/L (ref 3.5–5.1)
Sodium: 137 mEq/L (ref 135–145)
Total Bilirubin: 0.6 mg/dL (ref 0.3–1.2)
Total Protein: 7.2 g/dL (ref 6.0–8.3)

## 2011-01-15 LAB — DIFFERENTIAL
Basophils Absolute: 0 10*3/uL (ref 0.0–0.1)
Basophils Relative: 0 % (ref 0–1)
Eosinophils Absolute: 0.2 10*3/uL (ref 0.0–0.7)
Eosinophils Relative: 3 % (ref 0–5)
Lymphocytes Relative: 19 % (ref 12–46)
Lymphs Abs: 1.4 10*3/uL (ref 0.7–4.0)
Monocytes Absolute: 0.5 10*3/uL (ref 0.1–1.0)
Monocytes Relative: 7 % (ref 3–12)
Neutro Abs: 5.3 10*3/uL (ref 1.7–7.7)
Neutrophils Relative %: 70 % (ref 43–77)

## 2011-01-15 LAB — CCBB MATERNAL DONOR DRAW

## 2011-01-15 LAB — RPR: RPR Ser Ql: NONREACTIVE

## 2011-01-15 LAB — LIPASE, BLOOD: Lipase: 29 U/L (ref 11–59)

## 2011-01-16 LAB — COMPREHENSIVE METABOLIC PANEL
ALT: 58 U/L — ABNORMAL HIGH (ref 0–35)
AST: 47 U/L — ABNORMAL HIGH (ref 0–37)
Albumin: 3.4 g/dL — ABNORMAL LOW (ref 3.5–5.2)
Alkaline Phosphatase: 71 U/L (ref 39–117)
BUN: 19 mg/dL (ref 6–23)
CO2: 26 mEq/L (ref 19–32)
Calcium: 8.7 mg/dL (ref 8.4–10.5)
Chloride: 106 mEq/L (ref 96–112)
Creatinine, Ser: 0.96 mg/dL (ref 0.4–1.2)
GFR calc Af Amer: >60 mL/min (ref >60)
GFR calc non Af Amer: >60 mL/min (ref >60)
Glucose, Bld: 115 mg/dL — ABNORMAL HIGH (ref 70–99)
Potassium: 4.1 mEq/L (ref 3.5–5.1)
Sodium: 140 mEq/L (ref 135–145)
Total Bilirubin: 0.5 mg/dL (ref 0.3–1.2)
Total Protein: 6.6 g/dL (ref 6.0–8.3)

## 2011-01-16 LAB — DIFFERENTIAL
Basophils Absolute: 0 10*3/uL (ref 0.0–0.1)
Basophils Relative: 0 % (ref 0–1)
Eosinophils Absolute: 0.5 10*3/uL (ref 0.0–0.7)
Eosinophils Relative: 7 % — ABNORMAL HIGH (ref 0–5)
Lymphocytes Relative: 27 % (ref 12–46)
Lymphs Abs: 1.9 10*3/uL (ref 0.7–4.0)
Monocytes Absolute: 0.7 10*3/uL (ref 0.1–1.0)
Monocytes Relative: 11 % (ref 3–12)
Neutro Abs: 3.7 10*3/uL (ref 1.7–7.7)
Neutrophils Relative %: 54 % (ref 43–77)

## 2011-01-16 LAB — CBC
HCT: 42.7 % (ref 36.0–46.0)
Hemoglobin: 14.2 g/dL (ref 12.0–15.0)
MCHC: 33.2 g/dL (ref 30.0–36.0)
MCV: 90.3 fL (ref 78.0–100.0)
Platelets: 353 10*3/uL (ref 150–400)
RBC: 4.73 MIL/uL (ref 3.87–5.11)
RDW: 13.9 % (ref 11.5–15.5)
WBC: 6.9 10*3/uL (ref 4.0–10.5)

## 2011-01-16 LAB — LIPASE, BLOOD: Lipase: 32 U/L (ref 11–59)

## 2011-01-27 LAB — URINALYSIS, ROUTINE W REFLEX MICROSCOPIC
Bilirubin Urine: NEGATIVE
Glucose, UA: NEGATIVE
Hgb urine dipstick: NEGATIVE
Ketones, ur: NEGATIVE
Nitrite: NEGATIVE
Protein, ur: NEGATIVE
Specific Gravity, Urine: 1.01
Urobilinogen, UA: 0.2
pH: 7

## 2011-01-27 LAB — CBC
HCT: 40.8
Hemoglobin: 14
MCHC: 34.3
MCV: 91.9
Platelets: 278
RBC: 4.43
RDW: 13.8
WBC: 6.7

## 2011-01-27 LAB — GC/CHLAMYDIA PROBE AMP, GENITAL
Chlamydia, DNA Probe: NEGATIVE
GC Probe Amp, Genital: NEGATIVE

## 2011-01-27 LAB — POCT PREGNANCY, URINE
Operator id: 25721
Preg Test, Ur: NEGATIVE

## 2011-01-27 LAB — WET PREP, GENITAL
Clue Cells Wet Prep HPF POC: NONE SEEN
Trich, Wet Prep: NONE SEEN

## 2011-03-08 ENCOUNTER — Encounter (HOSPITAL_COMMUNITY): Payer: Self-pay | Admitting: Emergency Medicine

## 2011-03-08 ENCOUNTER — Emergency Department (HOSPITAL_COMMUNITY)
Admission: EM | Admit: 2011-03-08 | Discharge: 2011-03-08 | Disposition: A | Discharge status: Home / Self Care | Payer: Medicaid Other | Attending: Emergency Medicine | Admitting: Emergency Medicine

## 2011-03-08 DIAGNOSIS — G8929 Other chronic pain: Secondary | ICD-10-CM | POA: Insufficient documentation

## 2011-03-08 DIAGNOSIS — M545 Low back pain, unspecified: Secondary | ICD-10-CM | POA: Insufficient documentation

## 2011-03-08 DIAGNOSIS — F319 Bipolar disorder, unspecified: Secondary | ICD-10-CM | POA: Insufficient documentation

## 2011-03-08 HISTORY — DX: Bipolar disorder, unspecified: F31.9

## 2011-03-08 HISTORY — DX: Other chronic pain: G89.29

## 2011-03-08 HISTORY — DX: Dorsalgia, unspecified: M54.9

## 2011-03-08 MED ORDER — METAXALONE 800 MG PO TABS: 800.0000 mg | ORAL_TABLET | Freq: Three times a day (TID) | ORAL | Status: AC

## 2011-03-08 MED ORDER — HYDROCODONE-ACETAMINOPHEN 5-325 MG PO TABS: ORAL_TABLET | ORAL | Status: AC

## 2011-03-08 NOTE — ED Notes (Signed)
Pt. Stated, I've been having back pain a week ago maybe from hoding my son while being examined.

## 2011-03-08 NOTE — ED Provider Notes (Signed)
History     CSN: [id number] Arrival date & time: 03/08/2011 11:30 AM      Chief Complaint  Patient presents with  . Back Pain    HPI Pt was seen at 1155.  Per pt, c/o gradual onset and persistence of constant acute flair of her chronic low back "pain" for the past week.  Denies any change in her usual chronic pain pattern.  Denies incont/retention of bowel or bladder, no saddle anesthesia, no focal motor weakness, no tingling/numbness in extremities, no fevers, no injury.   The symptoms have been associated with no other complaints. The patient has a significant history of similar symptoms previously.     Past Medical History  Diagnosis Date  . Back pain, chronic   . Depressed bipolar I disorder     History reviewed. No pertinent past surgical history.   History  Substance Use Topics  . Smoking status: Not on file  . Smokeless tobacco: Not on file  . Alcohol Use: 0.6 oz/week    1 Cans of beer per week    Review of Systems ROS: Statement: All systems negative except as marked or noted in the HPI; Constitutional: Negative for fever and chills. ; ; Eyes: Negative for eye pain, redness and discharge. ; ; ENMT: Negative for ear pain, hoarseness, nasal congestion, sinus pressure and sore throat. ; ; Cardiovascular: Negative for chest pain, palpitations, diaphoresis, dyspnea and peripheral edema. ; ; Respiratory: Negative for cough, wheezing and stridor. ; ; Gastrointestinal: Negative for nausea, vomiting, diarrhea and abdominal pain, blood in stool, hematemesis, jaundice and rectal bleeding. . ; ; Genitourinary: Negative for dysuria, flank pain and hematuria. ; ; Musculoskeletal: +LBP. Negative for neck pain. Negative for swelling and trauma.; ; Skin: Negative for pruritus, rash, abrasions, blisters, bruising and skin lesion.; ; Neuro: Negative for headache, lightheadedness and neck stiffness. Negative for weakness, altered level of consciousness , altered mental status, extremity  weakness, paresthesias, involuntary movement, seizure and syncope.     Allergies  Darvocet  Home Medications   Current Outpatient Rx  Name Route Sig Dispense Refill  . METHOCARBAMOL 500 MG PO TABS Oral Take 500 mg by mouth daily as needed. For back pain     . NAPROXEN 250 MG PO TABS Oral Take 250 mg by mouth daily as needed. For back pain     . OXYCODONE-ACETAMINOPHEN 10-325 MG PO TABS Oral Take 1 tablet by mouth every 4 (four) hours as needed.        BP 119/71  Pulse 96  Temp(Src) 97.6 F (36.4 C) (Oral)  Resp 22  SpO2 99%  Physical Exam 1200: Physical examination:  Nursing notes reviewed; Vital signs and O2 SAT reviewed;  Constitutional: Well developed, Well nourished, Well hydrated, In no acute distress; Head:  Normocephalic, atraumatic; Eyes: EOMI, PERRL, No scleral icterus; ENMT: Mouth and pharynx normal, Mucous membranes moist; Neck: Supple, Full range of motion, No lymphadenopathy; Cardiovascular: Regular rate and rhythm, No murmur, rub, or gallop; Respiratory: Breath sounds clear & equal bilaterally, No rales, rhonchi, wheezes, or rub, Normal respiratory effort/excursion; Chest: Nontender, Movement normal; Abdomen: Soft, Nontender, Nondistended, Normal bowel sounds; Genitourinary: No CVA tenderness; Spine:  No midline CS, TS, LS tenderness. +TTP right lower lumbar paraspinal muscles. Extremities: Pulses normal, No tenderness, No edema, No calf edema or asymmetry.; Neuro: AA&Ox3, Major CN grossly intact. Strength 5/5 equal bilat UE's and LE's, including great toe dorsiflexion.  DTR 2/4 equal bilat UE's and LE's.  No gross sensory deficits.  Neg  straight leg raises bilat. Gait steady..; Skin: Color normal, Warm, Dry, no rash.    ED Course  Procedures    MDM  MDM Reviewed: previous chart, nursing note and vitals   12:06 PM:  Acute flair chronic back pain.  Will tx symptomatically for now.  Pt encouraged to f/u with PMD this week for her chronic pain needs. Verb understanding.       Aeden Matranga Allison Quarry, DO 03/09/11 1330

## 2011-05-20 ENCOUNTER — Encounter (HOSPITAL_COMMUNITY): Payer: Self-pay | Admitting: *Deleted

## 2011-05-20 ENCOUNTER — Emergency Department (HOSPITAL_COMMUNITY): Payer: Medicaid Other

## 2011-05-20 ENCOUNTER — Other Ambulatory Visit: Payer: Self-pay

## 2011-05-20 ENCOUNTER — Emergency Department (HOSPITAL_COMMUNITY)
Admission: EM | Admit: 2011-05-20 | Discharge: 2011-05-20 | Disposition: A | Discharge status: Home / Self Care | Payer: Medicaid Other | Attending: Emergency Medicine | Admitting: Emergency Medicine

## 2011-05-20 DIAGNOSIS — R209 Unspecified disturbances of skin sensation: Secondary | ICD-10-CM | POA: Insufficient documentation

## 2011-05-20 DIAGNOSIS — R079 Chest pain, unspecified: Secondary | ICD-10-CM | POA: Insufficient documentation

## 2011-05-20 DIAGNOSIS — F17200 Nicotine dependence, unspecified, uncomplicated: Secondary | ICD-10-CM | POA: Insufficient documentation

## 2011-05-20 DIAGNOSIS — M79609 Pain in unspecified limb: Secondary | ICD-10-CM | POA: Insufficient documentation

## 2011-05-20 DIAGNOSIS — Z975 Presence of (intrauterine) contraceptive device: Secondary | ICD-10-CM | POA: Insufficient documentation

## 2011-05-20 LAB — CBC
HCT: 39.8 % (ref 36.0–46.0)
Hemoglobin: 13.8 g/dL (ref 12.0–15.0)
MCH: 31.5 pg (ref 26.0–34.0)
MCHC: 34.7 g/dL (ref 30.0–36.0)
MCV: 90.9 fL (ref 78.0–100.0)
Platelets: 220 10*3/uL (ref 150–400)
RBC: 4.38 MIL/uL (ref 3.87–5.11)
RDW: 13.1 % (ref 11.5–15.5)
WBC: 4.9 10*3/uL (ref 4.0–10.5)

## 2011-05-20 LAB — TROPONIN I: Troponin I: <0.3 ng/mL (ref <0.30)

## 2011-05-20 LAB — BASIC METABOLIC PANEL
BUN: 10 mg/dL (ref 6–23)
CO2: 25 mEq/L (ref 19–32)
Calcium: 9.6 mg/dL (ref 8.4–10.5)
Chloride: 103 mEq/L (ref 96–112)
Creatinine, Ser: 0.65 mg/dL (ref 0.50–1.10)
GFR calc Af Amer: >90 mL/min (ref >90)
GFR calc non Af Amer: >90 mL/min (ref >90)
Glucose, Bld: 89 mg/dL (ref 70–99)
Potassium: 4.1 mEq/L (ref 3.5–5.1)
Sodium: 137 mEq/L (ref 135–145)

## 2011-05-20 MED ORDER — KETOROLAC TROMETHAMINE 30 MG/ML IJ SOLN
30.0000 mg | Freq: Once | INTRAMUSCULAR | Status: AC
  Administered 2011-05-20: 30 mg via INTRAVENOUS
  Filled 2011-05-20: qty 1

## 2011-05-20 NOTE — ED Notes (Signed)
Pt woke up this morning to put her children on the school bus and was having some mid chest pain (epigastric area) she went back to sleep and this pain was resolved when she woke up.  She now has left arm numbness and tingling

## 2011-05-20 NOTE — ED Provider Notes (Signed)
History     CSN: [id number]  Arrival date & time 05/20/11  1721   First MD Initiated Contact with Patient 05/20/11 1744      Chief Complaint  Patient presents with  . Chest Pain    episode of "indigestion like" chest pain this am.  Pt is now here for left arm numbness     HPI Left chest and arm pain.  She notes that she had been in her usual state of health, aside from chronic back pain and intermittent left distal lower extremity dysesthesia.  Over the past day she has gradually developed discomfort.  Initially her discomfort was primarily about her sternum with radiation to her left forearm and hand.  Since onset her chest discomfort has subsided, she continues to have dysesthesia about the left forearm and hand.  Her chest pain improved following aspirin.  It was neither pleuritic nor exertional.  Her arm pain is similarly neither pleuritic nor exertional.  Patient denies any recent health changes/ trauma/fall/new activities, notes that she has been losing weight.  She has no cardiac history.  She has an IUD.  She smokes cigarettes.  She denies any unilateral lower extremity edema.   Past Medical History  Diagnosis Date  . Back pain, chronic   . Depressed bipolar I disorder     History reviewed. No pertinent past surgical history.  No family history on file.  History  Substance Use Topics  . Smoking status: Not on file  . Smokeless tobacco: Not on file  . Alcohol Use: 0.6 oz/week    1 Cans of beer per week    OB History    Grav Para Term Preterm Abortions TAB SAB Ect Mult Living                  Review of Systems  Constitutional:       HPI  HENT:       HPI otherwise negative  Eyes: Negative.   Respiratory:       HPI, otherwise negative  Cardiovascular:       HPI, otherwise nmegative  Gastrointestinal: Negative for vomiting.  Genitourinary:       HPI, otherwise negative  Musculoskeletal:       HPI, otherwise negative  Skin: Negative.   Neurological: Negative  for syncope.    Allergies  Darvocet and Monistat  Home Medications   Current Outpatient Rx  Name Route Sig Dispense Refill  . FLUTICASONE PROPIONATE 50 MCG/ACT NA SUSP Nasal Place 2 sprays into the nose daily. Nasal congestion    . LORATADINE 10 MG PO TABS Oral Take 10 mg by mouth daily. allergies    . METHOCARBAMOL 500 MG PO TABS Oral Take 500 mg by mouth daily as needed. For back pain     . NAPROXEN SODIUM 220 MG PO TABS Oral Take 660 mg by mouth 2 (two) times daily with a meal. pain    . ZOLPIDEM TARTRATE 10 MG PO TABS Oral Take 10 mg by mouth at bedtime as needed. For sleep      BP 108/47  Pulse 69  Temp(Src) 98 F (36.7 C) (Oral)  Resp 18  SpO2 98%  Physical Exam  Nursing note and vitals reviewed. Constitutional: She is oriented to person, place, and time. She appears well-developed and well-nourished. No distress.  HENT:  Head: Normocephalic and atraumatic.  Eyes: Conjunctivae and EOM are normal.  Cardiovascular: Normal rate and regular rhythm.   Pulmonary/Chest: Effort normal and breath sounds normal.  No stridor. No respiratory distress.  Abdominal: She exhibits no distension.  Musculoskeletal: She exhibits no edema.       Left elbow: Normal.       Left wrist: Normal.       No deficiencies in strength or sensation throughout the left distal upper extremity  Neurological: She is alert and oriented to person, place, and time. She has normal strength. She displays no atrophy and no tremor. No cranial nerve deficit or sensory deficit. She exhibits normal muscle tone. She displays no seizure activity. Coordination and gait normal.  Skin: Skin is warm, dry and intact. No abrasion and no rash noted.  Psychiatric: She has a normal mood and affect.    ED Course  Procedures (including critical care time)   Labs Reviewed  CBC  BASIC METABOLIC PANEL  TROPONIN I   No results found.   No diagnosis found.  Pulse oximetry 99% room air normal   Date: 05/20/2011  Rate:  75  Rhythm: normal sinus rhythm  QRS Axis: normal  Intervals: normal  ST/T Wave abnormalities: normal  Conduction Disutrbances:none  Narrative Interpretation:   Old EKG Reviewed: none available NORMAL ECG  CXR: reviewed by me  Ice packs applied.  MDM  This well-appearing young female now presents with chest pain in the left arm dysesthesia.  On exam the patient has unremarkable vital signs, no discernible deficits nor weakness.  The patient's labs ECG and x-ray are all reassuring for absence of acute pathology (minimal RF for PE w no tachypnea, hypoxia, leg edema), (no PE findings, nor Hx of CVA), (no e/o acs).  A prolonged discussion was conducted with the patient and her mother regarding the possible causes of her symptoms, including neuropathy, plexopathy, muscle strain, medication reaction.  The patient was advised on a regimen for anti-inflammatories, given return precautions.         Gerhard Munch, MD 05/20/11 2030

## 2011-05-20 NOTE — ED Notes (Signed)
Onset one day ago chest pain center of chest radiating to between breast intermittent and continued today.  Currently chest  pain 0/10.  Patient states also have left upper extremity constant dull pain ranging from 1-4/10 and left finger tingling.  Radial pulses +2 bilateral and equal. Cap refill less then 3 seconds.  Airway intact bilateral equal chest rise and fall.  Patient states currently breast feeding her child and has not sleeping well lately and is sleep deprived. Family member at beside. Patient resting comfortably on stretcher.

## 2011-05-20 NOTE — ED Notes (Signed)
Back from CT, ambulatory, steady gait to b/r upon return, NAD, calm, interactive.

## 2011-05-20 NOTE — ED Notes (Signed)
Patient requesting coffee and snack.  Awaiting test results at present

## 2011-07-17 ENCOUNTER — Emergency Department (HOSPITAL_COMMUNITY)
Admission: EM | Admit: 2011-07-17 | Discharge: 2011-07-17 | Disposition: A | Discharge status: Home / Self Care | Payer: Medicaid Other | Attending: Emergency Medicine | Admitting: Emergency Medicine

## 2011-07-17 ENCOUNTER — Encounter (HOSPITAL_COMMUNITY): Payer: Self-pay | Admitting: *Deleted

## 2011-07-17 DIAGNOSIS — L2989 Other pruritus: Secondary | ICD-10-CM | POA: Insufficient documentation

## 2011-07-17 DIAGNOSIS — F319 Bipolar disorder, unspecified: Secondary | ICD-10-CM | POA: Insufficient documentation

## 2011-07-17 DIAGNOSIS — R21 Rash and other nonspecific skin eruption: Secondary | ICD-10-CM

## 2011-07-17 DIAGNOSIS — G8929 Other chronic pain: Secondary | ICD-10-CM | POA: Insufficient documentation

## 2011-07-17 DIAGNOSIS — L298 Other pruritus: Secondary | ICD-10-CM | POA: Insufficient documentation

## 2011-07-17 MED ORDER — HYDROCODONE-ACETAMINOPHEN 5-325 MG PO TABS: 1.0000 | ORAL_TABLET | ORAL | Status: AC | PRN

## 2011-07-17 MED ORDER — PENICILLIN V POTASSIUM 500 MG PO TABS: 500.0000 mg | ORAL_TABLET | Freq: Three times a day (TID) | ORAL | Status: AC

## 2011-07-17 NOTE — ED Notes (Signed)
To ed for eval of rash to face since being at the park 2 days ago. Pt has been putting topical ointment on right side of face without relief. Airway patent. Skin w/d, resp e/u.

## 2011-07-17 NOTE — ED Provider Notes (Signed)
History     CSN: [id number]  Arrival date & time 07/17/11  1005   First MD Initiated Contact with Patient 07/17/11 1046      Chief Complaint  Patient presents with  . Rash    (Consider location/radiation/quality/duration/timing/severity/associated sxs/prior treatment) HPI History provided by pt.   Presents w/ erythematous rash bilateral cheeks since sitting out in sun at the park 2 days ago.  Painful and pruritic.  Developed associated induration and edema yesterday.  Has taken benadryl w/ minimal relief of pruritis.  Does not have rash in any other location.  Denies dyspnea and dypshagia.  Denies trauma.  No known exposures.  No known allergies.  No recent medication changes.  Has not had fever, sore throat, nasal congestion, rhinorrhea, cough.    Past Medical History  Diagnosis Date  . Back pain, chronic   . Depressed bipolar I disorder     History reviewed. No pertinent past surgical history.  History reviewed. No pertinent family history.  History  Substance Use Topics  . Smoking status: Not on file  . Smokeless tobacco: Not on file  . Alcohol Use: 0.6 oz/week    1 Cans of beer per week    OB History    Grav Para Term Preterm Abortions TAB SAB Ect Mult Living                  Review of Systems  All other systems reviewed and are negative.    Allergies  Darvocet and Monistat  Home Medications   Current Outpatient Rx  Name Route Sig Dispense Refill  . FLUTICASONE PROPIONATE 50 MCG/ACT NA SUSP Nasal Place 2 sprays into the nose daily as needed. Nasal congestion    . LORATADINE 10 MG PO TABS Oral Take 10 mg by mouth daily as needed. allergies    . METHOCARBAMOL 500 MG PO TABS Oral Take 500 mg by mouth daily as needed. For back pain     . NAPROXEN SODIUM 220 MG PO TABS Oral Take 660 mg by mouth 2 (two) times daily as needed. For pain    . OXYCODONE-ACETAMINOPHEN 5-325 MG PO TABS Oral Take 1 tablet by mouth every 4 (four) hours as needed. For pain.    Marland Kitchen  ZOLPIDEM TARTRATE 10 MG PO TABS Oral Take 10 mg by mouth at bedtime as needed. For sleep    . HYDROCODONE-ACETAMINOPHEN 5-325 MG PO TABS Oral Take 1 tablet by mouth every 4 (four) hours as needed for pain. 15 tablet 0  . PENICILLIN V POTASSIUM 500 MG PO TABS Oral Take 1 tablet (500 mg total) by mouth 3 (three) times daily. 30 tablet 0    BP 126/72  Pulse 90  Temp(Src) 98.3 F (36.8 C) (Oral)  Resp 16  SpO2 99%  Physical Exam  Nursing note and vitals reviewed. Constitutional: She is oriented to person, place, and time. She appears well-developed and well-nourished. No distress.  HENT:  Head: Normocephalic and atraumatic.       No tongue or lip edema, no lesions on buccal mucosa and posterior pharynx normal.  Eyes:       Normal appearance  Neck: Normal range of motion. Neck supple.  Lymphadenopathy:    She has no cervical adenopathy.  Neurological: She is alert and oriented to person, place, and time.  Skin:       Erythematous and mildly induated patch covering bilateral cheeks (more prominent on right) w/ 1-50mm discrete, macular satellite lesions at jaw and upper neck.  Mild edema of right face.  Mild tenderness left cheek, moderate right.  No other skin rash.  Psychiatric: She has a normal mood and affect. Her behavior is normal.    ED Course  Procedures (including critical care time)  Labs Reviewed - No data to display No results found.   1. Rash       MDM  Pt presents w/ painful/pruritic rash bilateral cheeks x 2 days.  No associated sx.  No respiratory compromise. No recent illness/exposure/trauma and no known allergens.  Rash could be erysipelas or early fifth's disease.  Pt d/c'd home w/ penicillin and vicodin.  Recommended f/u with her PCP on Monday if no improvement in sx.  Return precautions discussed.         Otilio Miu, [place] 07/17/11 [phone]

## 2011-07-17 NOTE — Discharge Instructions (Signed)
Take vicodin as prescribed for severe pain.   Do not drive within four hours of taking this medication (Weekly cause drowsiness or confusion).  Take benadryl as needed for itching.  Take antibiotic as prescribed for possible bacterial infection.  Apply cool compresses.  If no improvement by early next week, follow up with your primary care doctor.   You Armitage return to the ER if symptoms worsen or you have any other concerns.

## 2011-07-17 NOTE — ED Notes (Signed)
[facility] pharmacy called about vicodin order because pt has just picked a 100 percocet on 4/3. I told her to cancel the vicodin prescription.

## 2011-07-18 ENCOUNTER — Observation Stay (HOSPITAL_COMMUNITY)
Admission: EM | Admit: 2011-07-18 | Discharge: 2011-07-19 | Disposition: A | Discharge status: Home / Self Care | Payer: Medicaid Other | Attending: Emergency Medicine | Admitting: Emergency Medicine

## 2011-07-18 ENCOUNTER — Encounter (HOSPITAL_COMMUNITY): Payer: Self-pay | Admitting: *Deleted

## 2011-07-18 DIAGNOSIS — F3130 Bipolar disorder, current episode depressed, mild or moderate severity, unspecified: Secondary | ICD-10-CM | POA: Insufficient documentation

## 2011-07-18 DIAGNOSIS — L03211 Cellulitis of face: Principal | ICD-10-CM | POA: Insufficient documentation

## 2011-07-18 DIAGNOSIS — H5789 Other specified disorders of eye and adnexa: Secondary | ICD-10-CM | POA: Insufficient documentation

## 2011-07-18 DIAGNOSIS — L0201 Cutaneous abscess of face: Principal | ICD-10-CM | POA: Insufficient documentation

## 2011-07-18 LAB — BASIC METABOLIC PANEL
BUN: 15 mg/dL (ref 6–23)
CO2: 22 mEq/L (ref 19–32)
Calcium: 9.2 mg/dL (ref 8.4–10.5)
Chloride: 104 mEq/L (ref 96–112)
Creatinine, Ser: 0.61 mg/dL (ref 0.50–1.10)
GFR calc Af Amer: >90 mL/min (ref >90)
GFR calc non Af Amer: >90 mL/min (ref >90)
Glucose, Bld: 99 mg/dL (ref 70–99)
Potassium: 4.2 mEq/L (ref 3.5–5.1)
Sodium: 137 mEq/L (ref 135–145)

## 2011-07-18 LAB — CBC
HCT: 41.6 % (ref 36.0–46.0)
Hemoglobin: 14.2 g/dL (ref 12.0–15.0)
MCH: 31.6 pg (ref 26.0–34.0)
MCHC: 34.1 g/dL (ref 30.0–36.0)
MCV: 92.4 fL (ref 78.0–100.0)
Platelets: 246 10*3/uL (ref 150–400)
RBC: 4.5 MIL/uL (ref 3.87–5.11)
RDW: 13.5 % (ref 11.5–15.5)
WBC: 8.8 10*3/uL (ref 4.0–10.5)

## 2011-07-18 MED ORDER — DIPHENHYDRAMINE HCL 50 MG/ML IJ SOLN
25.0000 mg | Freq: Once | INTRAMUSCULAR | Status: AC
  Administered 2011-07-18: 25 mg via INTRAVENOUS
  Filled 2011-07-18: qty 1

## 2011-07-18 MED ORDER — MORPHINE SULFATE 4 MG/ML IJ SOLN
4.0000 mg | Freq: Once | INTRAMUSCULAR | Status: AC
  Administered 2011-07-18: 4 mg via INTRAVENOUS
  Filled 2011-07-18: qty 1

## 2011-07-18 MED ORDER — ONDANSETRON HCL 4 MG/2ML IJ SOLN
4.0000 mg | Freq: Once | INTRAMUSCULAR | Status: AC
  Administered 2011-07-18: 4 mg via INTRAVENOUS
  Filled 2011-07-18: qty 2

## 2011-07-18 MED ORDER — CEPHALEXIN 500 MG PO CAPS: 500.0000 mg | ORAL_CAPSULE | Freq: Four times a day (QID) | ORAL | Status: AC

## 2011-07-18 MED ORDER — CLINDAMYCIN PHOSPHATE 600 MG/50ML IV SOLN
600.0000 mg | Freq: Once | INTRAVENOUS | Status: AC
  Administered 2011-07-18: 600 mg via INTRAVENOUS
  Filled 2011-07-18: qty 50

## 2011-07-18 MED ORDER — VANCOMYCIN HCL IN DEXTROSE 1-5 GM/200ML-% IV SOLN
1000.0000 mg | Freq: Once | INTRAVENOUS | Status: AC
  Administered 2011-07-18: 1000 mg via INTRAVENOUS
  Filled 2011-07-18: qty 200

## 2011-07-18 MED ORDER — SODIUM CHLORIDE 0.9 % IV SOLN
Freq: Once | INTRAVENOUS | Status: AC
  Administered 2011-07-18: 10:00:00 via INTRAVENOUS

## 2011-07-18 MED ORDER — METHYLPREDNISOLONE SODIUM SUCC 125 MG IJ SOLR
125.0000 mg | Freq: Once | INTRAMUSCULAR | Status: AC
  Administered 2011-07-18: 125 mg via INTRAVENOUS
  Filled 2011-07-18: qty 2

## 2011-07-18 MED ORDER — SULFAMETHOXAZOLE-TRIMETHOPRIM 800-160 MG PO TABS: 1.0000 | ORAL_TABLET | Freq: Two times a day (BID) | ORAL | Status: AC

## 2011-07-18 MED ORDER — FAMOTIDINE IN NACL 20-0.9 MG/50ML-% IV SOLN
20.0000 mg | Freq: Once | INTRAVENOUS | Status: AC
  Administered 2011-07-18: 20 mg via INTRAVENOUS
  Filled 2011-07-18: qty 50

## 2011-07-18 MED ORDER — OXYCODONE-ACETAMINOPHEN 5-325 MG PO TABS: 1.0000 | ORAL_TABLET | ORAL | Status: AC | PRN

## 2011-07-18 MED ORDER — HYDROMORPHONE HCL PF 1 MG/ML IJ SOLN
1.0000 mg | Freq: Once | INTRAMUSCULAR | Status: AC
  Administered 2011-07-18: 1 mg via INTRAVENOUS
  Filled 2011-07-18: qty 1

## 2011-07-18 MED ORDER — SODIUM CHLORIDE 0.9 % IV SOLN
Freq: Once | INTRAVENOUS | Status: AC
  Administered 2011-07-18: 06:00:00 via INTRAVENOUS

## 2011-07-18 MED ORDER — DEXAMETHASONE SODIUM PHOSPHATE 10 MG/ML IJ SOLN: 10.0000 mg | Freq: Once | INTRAMUSCULAR | Status: DC

## 2011-07-18 NOTE — ED Notes (Signed)
Ordered breakfast - regular - w/Susan

## 2011-07-18 NOTE — ED Notes (Signed)
Left message w/service response for pt's regular lunch tray

## 2011-07-18 NOTE — ED Provider Notes (Signed)
History     CSN: [id number]  Arrival date & time 07/18/11  0028   First MD Initiated Contact with Patient 07/18/11 [phone]      Chief Complaint  Patient presents with  . Eye Pain    (Consider location/radiation/quality/duration/timing/severity/associated sxs/prior treatment) HPI History from patient. She presents with an erythematous rash to her bilateral cheeks. He rash began 2 days ago, is painful and pruritic. Developed edema yesterday. No known exposures. She was actually seen here for the same early yesterday morning, was given a prescription for penicillin for possible erysipelas, and instructions to followup with her regular doctor. She states that she seems to be worsening. Has noticed increased swelling, and states that her right eye is now swollen shut. Denies pain in the right eye with eye movement. Denies f/c, n/v, abd/chest pain.  Past Medical History  Diagnosis Date  . Back pain, chronic   . Depressed bipolar I disorder     History reviewed. No pertinent past surgical history.  History reviewed. No pertinent family history.  History  Substance Use Topics  . Smoking status: Current Everyday Smoker -- 0.2 packs/day  . Smokeless tobacco: Not on file  . Alcohol Use: 0.6 oz/week    1 Cans of beer per week    OB History    Grav Para Term Preterm Abortions TAB SAB Ect Mult Living                  Review of Systems as per HPI  Allergies  Darvocet and Monistat  Home Medications   Current Outpatient Rx  Name Route Sig Dispense Refill  . DIPHENHYDRAMINE HCL 25 MG PO CAPS Oral Take 25 mg by mouth every 6 (six) hours as needed. For swollen eye    . FLUTICASONE PROPIONATE 50 MCG/ACT NA SUSP Nasal Place 2 sprays into the nose daily as needed. Nasal congestion    . HYDROCORTISONE 1 % EX CREA Topical Apply 1 application topically 2 (two) times daily. Rash/swelling on face    . LORATADINE 10 MG PO TABS Oral Take 10 mg by mouth daily as needed. allergies    .  METHOCARBAMOL 500 MG PO TABS Oral Take 500 mg by mouth daily as needed. For back pain     . NAPROXEN SODIUM 220 MG PO TABS Oral Take 660 mg by mouth 2 (two) times daily as needed. For pain    . OXYCODONE-ACETAMINOPHEN 5-325 MG PO TABS Oral Take 1 tablet by mouth every 4 (four) hours as needed. For pain.    Marland Kitchen PENICILLIN V POTASSIUM 500 MG PO TABS Oral Take 1 tablet (500 mg total) by mouth 3 (three) times daily. 30 tablet 0  . ZOLPIDEM TARTRATE 10 MG PO TABS Oral Take 10 mg by mouth at bedtime as needed. For sleep    . HYDROCODONE-ACETAMINOPHEN 5-325 MG PO TABS Oral Take 1 tablet by mouth every 4 (four) hours as needed for pain. 15 tablet 0    BP 120/59  Pulse 96  Temp 97.8 F (36.6 C)  Resp 18  SpO2 96%  Physical Exam  Nursing note and vitals reviewed. Constitutional: She appears well-developed and well-nourished. No distress.       Afebrile, nontoxic  HENT:  Head: Normocephalic.  Eyes:       L EOMI, PERRL, no pain in R eye with EOMs  Neck: Normal range of motion. Neck supple.  Cardiovascular: Normal rate.   Pulmonary/Chest: Effort normal.  Lymphadenopathy:    She has no cervical adenopathy.  Neurological: She is alert.  Skin: Skin is warm and dry. She is not diaphoretic.       Erythematous rash covering bilateral cheeks, with tiny papules. The right cheek is mildly tender to palpation. Periorbital and R maxillary erythema, unable to open eye. Slight area of increased firmness to R maxillary area.  Psychiatric: She has a normal mood and affect.    ED Course  Procedures (including critical care time)   Labs Reviewed  CBC  BASIC METABOLIC PANEL   No results found.   No diagnosis found.    MDM  Pt with worsening swelling from rash, for which she was seen yesterday morning. Not improving with outpatient treatment. Moved to CDU on cellulitis protocol, first dose of IV clindamycin ordered. Pt signed out to Dr. Brooke Dare, blue side EDP, at 0730.       [place],  [place] 07/18/11 [phone]

## 2011-07-18 NOTE — ED Provider Notes (Addendum)
Patient states swelling of face and) oral area is improving. Pain is improving. She has no pain on x-ray October movement. On exam right face is reddened and swollen. Extraocular muscles are intact she has no pain on extraocular motion patient is alert nontoxic appearing  Doug Sou, MD 07/18/11 2241  Addendum 1:30 AM patient feels improved and ready to go home Patient alert and in no distress Results for orders placed during the hospital encounter of 07/18/11  CBC      Component Value Range   WBC 8.8  4.0 - 10.5 (K/uL)   RBC 4.50  3.87 - 5.11 (MIL/uL)   Hemoglobin 14.2  12.0 - 15.0 (g/dL)   HCT 16.1  09.6 - 04.5 (%)   MCV 92.4  78.0 - 100.0 (fL)   MCH 31.6  26.0 - 34.0 (pg)   MCHC 34.1  30.0 - 36.0 (g/dL)   RDW 40.9  81.1 - 91.4 (%)   Platelets 246  150 - 400 (K/uL)  BASIC METABOLIC PANEL      Component Value Range   Sodium 137  135 - 145 (mEq/L)   Potassium 4.2  3.5 - 5.1 (mEq/L)   Chloride 104  96 - 112 (mEq/L)   CO2 22  19 - 32 (mEq/L)   Glucose, Bld 99  70 - 99 (mg/dL)   BUN 15  6 - 23 (mg/dL)   Creatinine, Ser 7.82  0.50 - 1.10 (mg/dL)   Calcium 9.2  8.4 - 95.6 (mg/dL)   GFR calc non Af Amer >90  >90 (mL/min)   GFR calc Af Amer >90  >90 (mL/min)   Ct Maxillofacial W/cm  07/19/2011  *RADIOLOGY REPORT*  Clinical Data: Erythema and swelling along the right side of the face.  CT MAXILLOFACIAL WITH CONTRAST  Technique:  Multidetector CT imaging of the maxillofacial structures was performed with intravenous contrast. Multiplanar CT image reconstructions were also generated.  Contrast: 75mL OMNIPAQUE IOHEXOL 300 MG/ML  SOLN  Comparison: None.  Findings: Right periorbital subcutaneous stranding and swelling noted.  No postseptal extension into the orbit is identified.  The globes appear unremarkable, and no orbital abscess is observed.  There is polypoid mucoperiosteal thickening in the right maxillary sinus along with mucosal thickening in the left maxillary sinus. Mucosal  thickening is present in multiple ethmoid air cells and in the sphenoid sinuses.  No mastoid effusion or middle ear fluid is observed.  No significant dental cavities or periapical lucency is observed. A dental origin for the right facial swelling is not definitively seen.  No abscess noted.  IMPRESSION:  1.  Right periorbital and right facial soft tissue swelling and subcutaneous stranding favoring cellulitis.  No intraorbital extension. 2.  Chronic maxillary, ethmoid, and sphenoid sinusitis.  Original Report Authenticated By: Dellia Cloud, M.D.    Doug Sou, MD 07/19/11 2130  Doug Sou, MD 07/19/11 [phone]

## 2011-07-18 NOTE — ED Notes (Signed)
Patient transported to CT 

## 2011-07-18 NOTE — ED Provider Notes (Signed)
Medical screening examination/treatment/procedure(s) were conducted as a shared visit with non-physician practitioner(s) and myself.  I personally evaluated the patient during the encounter  Erythema and periorbital and right maxillary edema. The patient has failed outpatient treatment. We will place her in the CDU cellulitis protocol.  Hanley Seamen, MD 07/18/11 [phone]

## 2011-07-18 NOTE — ED Notes (Signed)
The pt  Is asleep

## 2011-07-18 NOTE — ED Notes (Signed)
Report given to the cdu rns

## 2011-07-18 NOTE — ED Provider Notes (Signed)
Facial swelling still prominent on right face, although she is able to open eye somewhat, which is a subjective improvement. Pain controlled. Itching is increasing and will give Benadryl. Will continue to observe.  16:00:  Patient feels some better. Swelling continues to improve. She states uncomfortable with discharge at present and would like to observe for more time to insure continued improvement.  19:40:  She had been feeling better but swelling again worsened and burning type pain returned as well. No fever. Have given Benadryl every 4 hours, as well as regular pain medication. Will observe through second dose of Vancomycin at 10:00 p.m.   20:40:  Swelling is improved but burning pain is persistent. Dr. Ethelda Chick in to see. CT maxillofacial w/CM ordered to r/o deep abscess. Anticipate discharge home if negative.  Rodena Medin, PA-C 07/18/11 2343

## 2011-07-18 NOTE — ED Notes (Signed)
Regular Diet Ordered 

## 2011-07-18 NOTE — ED Notes (Signed)
The pt has redness and swelling to her  Rt eye and the rt side of her face.  She was seen here in th ed earlier yesterday for the same.  No better getting worse.Marland Kitchen  Her rt eye is swollen almost totally closed

## 2011-07-18 NOTE — ED Notes (Signed)
The pt and family member getting very upset that the pt has not been seen by a doctor yet.

## 2011-07-18 NOTE — ED Provider Notes (Signed)
Worsening cellulitis of the face. Was placed on vancomycin. She was also given steroids and Benadryl for worsening swelling and itching.  Dayton Bailiff, MD 07/18/11 1001

## 2011-07-18 NOTE — ED Notes (Signed)
Patient's family left at this time.  [place], [phone].

## 2011-07-18 NOTE — ED Notes (Signed)
Pt reports being evaluated in the ED previous today for (R) eye pain and swelling.  Reports that the pain and swelling is worse.  (R) eye is swollen shut, no other swelling noted.  Pt is also noted to have a rash on her (R) neck and face.

## 2011-07-18 NOTE — ED Notes (Signed)
Pt awake then right back to sleep.  More swelling since she slept for the past 2 hours before she was seen by the edp

## 2011-07-18 NOTE — ED Notes (Signed)
MD at bedside. 

## 2011-07-18 NOTE — ED Notes (Signed)
Pt c/o of face itching more and that swelling under rt eye is worse.

## 2011-07-18 NOTE — ED Notes (Addendum)
Pt received clindamycin at 630 this am.

## 2011-07-18 NOTE — ED Notes (Signed)
The pt c/o head pain and chest pain when receiving iv morphine.  She wants a bus pass she wants med here before she leaves and she does not have the money for any rxs either.

## 2011-07-19 ENCOUNTER — Observation Stay (HOSPITAL_COMMUNITY): Payer: Medicaid Other

## 2011-07-19 ENCOUNTER — Encounter (HOSPITAL_COMMUNITY): Payer: Self-pay | Admitting: Radiology

## 2011-07-19 MED ORDER — HYDROMORPHONE HCL PF 1 MG/ML IJ SOLN
1.0000 mg | Freq: Once | INTRAMUSCULAR | Status: AC
  Administered 2011-07-19: 1 mg via INTRAVENOUS
  Filled 2011-07-19: qty 1

## 2011-07-19 MED ORDER — IOHEXOL 300 MG/ML  SOLN
75.0000 mL | Freq: Once | INTRAMUSCULAR | Status: AC | PRN
  Administered 2011-07-19: 75 mL via INTRAVENOUS

## 2011-07-19 NOTE — Discharge Instructions (Signed)
FOLLOW UP WITH DR. Concepcion Elk FOR RECHECK IN 1-2 DAYS. STOP TAKING VEETIDS (PENICILLIN) AND START TAKING KEFLEX AND SEPTRA FOR INFECTION. CONTINUE COOL COMPRESSES AND BENADRYL FOR ITCHING AND SWELLING EVERY 4 HOURS. RETURN HERE WITH UNCONTROLLED PAIN, HIGH FEVER, OR NEW CONCERN.   Cellulitis Cellulitis is an infection of the tissue under the skin. The infected area is usually red and tender. This is caused by germs. These germs enter the body through cuts or sores. This usually happens in the arms or lower legs. HOME CARE   Take your medicine as told. Finish it even if you start to feel better.   If the infection is on the arm or leg, keep it raised (elevated).   Use a warm cloth on the infected area several times a day.   See your doctor for a follow-up visit as told.  GET HELP RIGHT AWAY IF:   You are tired or confused.   You throw up (vomit).   You have watery poop (diarrhea).   You feel ill and have muscle aches.   You have a fever.  MAKE SURE YOU:   Understand these instructions.   Will watch your condition.   Will get help right away if you are not doing well or get worse.  Document Released: 09/16/2007 Document Revised: 03/19/2011 Document Reviewed: 03/01/2009 [facility] Patient Information 2012 [place], [place].

## 2011-07-19 NOTE — ED Provider Notes (Signed)
Medical screening examination/treatment/procedure(s) were performed by non-physician practitioner and as supervising physician I was immediately available for consultation/collaboration.   Gavin Pound. Oletta Lamas, MD 07/19/11 1610

## 2011-11-21 ENCOUNTER — Emergency Department (HOSPITAL_COMMUNITY): Payer: Medicaid Other

## 2011-11-21 ENCOUNTER — Encounter (HOSPITAL_COMMUNITY): Payer: Self-pay | Admitting: Emergency Medicine

## 2011-11-21 ENCOUNTER — Emergency Department (HOSPITAL_COMMUNITY)
Admission: EM | Admit: 2011-11-21 | Discharge: 2011-11-21 | Disposition: A | Discharge status: Home / Self Care | Payer: Medicaid Other | Attending: Emergency Medicine | Admitting: Emergency Medicine

## 2011-11-21 DIAGNOSIS — J218 Acute bronchiolitis due to other specified organisms: Secondary | ICD-10-CM | POA: Insufficient documentation

## 2011-11-21 DIAGNOSIS — J219 Acute bronchiolitis, unspecified: Secondary | ICD-10-CM

## 2011-11-21 DIAGNOSIS — F319 Bipolar disorder, unspecified: Secondary | ICD-10-CM | POA: Insufficient documentation

## 2011-11-21 DIAGNOSIS — F17200 Nicotine dependence, unspecified, uncomplicated: Secondary | ICD-10-CM | POA: Insufficient documentation

## 2011-11-21 MED ORDER — AZITHROMYCIN 500 MG PO TABS: 500.0000 mg | ORAL_TABLET | Freq: Every day | ORAL | Status: AC

## 2011-11-21 MED ORDER — HYDROCODONE-ACETAMINOPHEN 7.5-500 MG/15ML PO SOLN: 10.0000 mL | Freq: Four times a day (QID) | ORAL | Status: AC | PRN

## 2011-11-21 NOTE — ED Notes (Signed)
Pt here with URI sx and cough with SOB and difficulty taking deep breath x 4 days; pt sts hx of same with bronchitis in past

## 2011-11-21 NOTE — ED Notes (Signed)
At 1007 documented to wrong pt.

## 2011-11-21 NOTE — ED Provider Notes (Signed)
History     CSN: [id number]  Arrival date & time 11/21/11  1000   First MD Initiated Contact with Patient 11/21/11 1049      Chief Complaint  Patient presents with  . URI  . Shortness of Breath  . Cough    (Consider location/radiation/quality/duration/timing/severity/associated sxs/prior treatment) HPI  26 year old female in no acute distress complaining of cough turning productive over the course of 4 days for shortness of breath and pleuritic chest pain. Sputum has a small amount of blood streaking last night.  Patient also reports runny nose. he denies fever nausea or vomiting  Past Medical History  Diagnosis Date  . Back pain, chronic   . Depressed bipolar I disorder     History reviewed. No pertinent past surgical history.  History reviewed. No pertinent family history.  History  Substance Use Topics  . Smoking status: Current Everyday Smoker -- 0.2 packs/day  . Smokeless tobacco: Not on file  . Alcohol Use: 0.6 oz/week    1 Cans of beer per week    OB History    Grav Para Term Preterm Abortions TAB SAB Ect Mult Living                  Review of Systems  Constitutional: Negative for fatigue.  HENT: Positive for rhinorrhea.   Respiratory: Positive for cough and shortness of breath.   All other systems reviewed and are negative.    Allergies  Darvocet and Miconazole nitrate  Home Medications   Current Outpatient Rx  Name Route Sig Dispense Refill  . ACETAMINOPHEN 500 MG PO TABS Oral Take 500 mg by mouth every 6 (six) hours as needed. For pain    . ALBUTEROL SULFATE HFA 108 (90 BASE) MCG/ACT IN AERS Inhalation Inhale 2 puffs into the lungs every 4 (four) hours as needed. For shortness of breath    . DIPHENHYDRAMINE HCL 25 MG PO CAPS Oral Take 25 mg by mouth every 6 (six) hours as needed. For allergies    . FLUTICASONE PROPIONATE 50 MCG/ACT NA SUSP Nasal Place 2 sprays into the nose daily as needed. Nasal congestion    . LORATADINE 10 MG PO TABS Oral  Take 10 mg by mouth daily as needed. allergies    . OXYCODONE-ACETAMINOPHEN 5-325 MG PO TABS Oral Take 1 tablet by mouth every 4 (four) hours as needed. For pain.    Marland Kitchen ZOLPIDEM TARTRATE 10 MG PO TABS Oral Take 10 mg by mouth at bedtime as needed. For sleep      BP 114/71  Pulse 84  Temp 97.9 F (36.6 C) (Oral)  Resp 18  SpO2 98%  Physical Exam  Nursing note and vitals reviewed. Constitutional: She is oriented to person, place, and time. She appears well-developed and well-nourished. No distress.  HENT:  Head: Normocephalic.  Mouth/Throat: Oropharynx is clear and moist.  Eyes: Conjunctivae and EOM are normal. Pupils are equal, round, and reactive to light.  Cardiovascular: Normal rate.   Pulmonary/Chest: Effort normal. No respiratory distress. She has wheezes. She has no rales. She exhibits no tenderness.       Upper airway expiratory wheezing.  Abdominal: Soft. Bowel sounds are normal.  Musculoskeletal: Normal range of motion.  Lymphadenopathy:    She has cervical adenopathy.  Neurological: She is alert and oriented to person, place, and time.  Psychiatric: She has a normal mood and affect.    ED Course  Procedures (including critical care time)  Labs Reviewed - No data to display  Dg Chest 2 View  11/21/2011  *RADIOLOGY REPORT*  Clinical Data: Shortness of breath and cough.  CHEST - 2 VIEW  Comparison: Chest x-ray 05/20/2011.  Findings: Lung volumes are normal.  No consolidative airspace disease.  No pleural effusions.  No pneumothorax.  No pulmonary nodule or mass noted.  Pulmonary vasculature and the cardiomediastinal silhouette are within normal limits.   Surgical clips project over the right upper quadrant of the abdomen, likely from prior cholecystectomy.  IMPRESSION: 1. No radiographic evidence of acute cardiopulmonary disease.  Original Report Authenticated By: Florencia Reasons, M.D.     1. Acute bronchiolitis       MDM  26 year old female with productive cough  worsening over the course of 4 days. Patient reports shortness of breath chest x-ray shows no infiltrate. Lung sounds reveal mild upper airway expiratory wheezing I will treat her for acute bronchitis with 3 days of azithromycin and control the cough with hydrocodone acetaminophen elixir. Pt verbalized understanding and agrees with care plan. Outpatient follow-up and return precautions given.           Marissa Emery, PA-C 11/21/11 1126

## 2011-11-22 NOTE — ED Provider Notes (Signed)
Medical screening examination/treatment/procedure(s) were performed by non-physician practitioner and as supervising physician I was immediately available for consultation/collaboration.  Anjolina Byrer, MD 11/22/11 1723 

## 2012-02-16 ENCOUNTER — Emergency Department (HOSPITAL_COMMUNITY)
Admission: EM | Admit: 2012-02-16 | Discharge: 2012-02-16 | Disposition: A | Discharge status: Home / Self Care | Payer: Medicaid Other | Attending: Emergency Medicine | Admitting: Emergency Medicine

## 2012-02-16 ENCOUNTER — Emergency Department (HOSPITAL_COMMUNITY): Payer: Medicaid Other

## 2012-02-16 ENCOUNTER — Encounter (HOSPITAL_COMMUNITY): Payer: Self-pay | Admitting: Physical Medicine and Rehabilitation

## 2012-02-16 DIAGNOSIS — Y9301 Activity, walking, marching and hiking: Secondary | ICD-10-CM | POA: Insufficient documentation

## 2012-02-16 DIAGNOSIS — M25579 Pain in unspecified ankle and joints of unspecified foot: Secondary | ICD-10-CM | POA: Insufficient documentation

## 2012-02-16 DIAGNOSIS — F3130 Bipolar disorder, current episode depressed, mild or moderate severity, unspecified: Secondary | ICD-10-CM | POA: Insufficient documentation

## 2012-02-16 DIAGNOSIS — F17200 Nicotine dependence, unspecified, uncomplicated: Secondary | ICD-10-CM | POA: Insufficient documentation

## 2012-02-16 DIAGNOSIS — M79673 Pain in unspecified foot: Secondary | ICD-10-CM

## 2012-02-16 DIAGNOSIS — X500XXA Overexertion from strenuous movement or load, initial encounter: Secondary | ICD-10-CM | POA: Insufficient documentation

## 2012-02-16 DIAGNOSIS — G8929 Other chronic pain: Secondary | ICD-10-CM | POA: Insufficient documentation

## 2012-02-16 DIAGNOSIS — Y929 Unspecified place or not applicable: Secondary | ICD-10-CM | POA: Insufficient documentation

## 2012-02-16 NOTE — ED Provider Notes (Signed)
Marissa Smith S 8:00 PM patient discussed in sign out with Dr. Ignacia Palma. Patient awaiting x-rays of foot to rule out possible fifth metatarsal fracture. Patient is tender and painful over this area. Foot neurovascularly intact.  X-rays do not show any fractures or dislocation. At this time we'll discharge patient home with PCP followup. Patient instructed on RICE treatment.  Findings and treatment plan discussed with patient. She agrees with this plan. She feels she is able to ambulate on the foot without need of crutches this time. She has been instructed to followup this week or next week with her PCP.   Angus Seller, [place] 02/16/12 2043

## 2012-02-16 NOTE — ED Notes (Signed)
Patient reports intermittent pain.  Pulses present and within normal limits in bilateral feet and bilateral lower extremities.

## 2012-02-16 NOTE — ED Provider Notes (Signed)
History  Scribed for Carleene Cooper III, MD, the patient was seen in room TR07C/TR07C. This chart was scribed by Candelaria Stagers. The patient's care started at 7:37 PM  CSN: [id number]  Arrival date & time 02/16/12  1745   First MD Initiated Contact with Patient 02/16/12 1934      Chief Complaint  Patient presents with  . Foot Pain     The history is provided by the patient. No language interpreter was used.   Marissa Smith is a 26 y.o. female who presents to the Emergency Department complaining of left foot pain after stepping in a hole twisting her foot about four days ago.  Pt has h/o plantar fasciitis that has become worse since the incident.  She states she is having trouble walking now.  She has taken tylenol and ibuprofen with little relief.   Past Medical History  Diagnosis Date  . Back pain, chronic   . Depressed bipolar I disorder     No past surgical history on file.  No family history on file.  History  Substance Use Topics  . Smoking status: Current Every Day Smoker -- 0.2 packs/day  . Smokeless tobacco: Not on file  . Alcohol Use: 0.6 oz/week    1 Cans of beer per week    OB History    Grav Para Term Preterm Abortions TAB SAB Ect Mult Living                  Review of Systems  Constitutional: Negative for fever.  HENT: Positive for sore throat (mild sore throat). Negative for ear pain.   Respiratory: Positive for cough.   Gastrointestinal: Negative for nausea, vomiting and diarrhea.  Genitourinary: Negative for difficulty urinating.  Musculoskeletal: Positive for arthralgias (left foot pain).  Skin: Negative for rash.  Neurological: Negative for syncope.  All other systems reviewed and are negative.    Allergies  Darvocet and Miconazole nitrate  Home Medications   Current Outpatient Rx  Name  Route  Sig  Dispense  Refill  . ACETAMINOPHEN 500 MG PO TABS   Oral   Take 500 mg by mouth every 6 (six) hours as needed. For pain         .  ALBUTEROL SULFATE HFA 108 (90 BASE) MCG/ACT IN AERS   Inhalation   Inhale 2 puffs into the lungs every 4 (four) hours as needed. For shortness of breath         . DIPHENHYDRAMINE HCL 25 MG PO CAPS   Oral   Take 25 mg by mouth every 6 (six) hours as needed. For allergies         . FLUTICASONE PROPIONATE 50 MCG/ACT NA SUSP   Nasal   Place 2 sprays into the nose daily as needed. Nasal congestion         . LORATADINE 10 MG PO TABS   Oral   Take 10 mg by mouth daily as needed. allergies         . METHOCARBAMOL 500 MG PO TABS   Oral   Take 500 mg by mouth 4 (four) times daily as needed. For muscle spasms         . OXYCODONE-ACETAMINOPHEN 5-325 MG PO TABS   Oral   Take 1 tablet by mouth every 4 (four) hours as needed. For pain.         Marland Kitchen PSEUDOEPHEDRINE HCL 60 MG PO TABS   Oral   Take 60 mg by mouth every 4 (  four) hours as needed. For cold symptoms         . ZOLPIDEM TARTRATE 10 MG PO TABS   Oral   Take 10 mg by mouth at bedtime as needed. For sleep           BP 131/75  Pulse 75  Temp 97.8 F (36.6 C) (Oral)  Resp 20  SpO2 98%  Physical Exam  Nursing note and vitals reviewed. Constitutional: She is oriented to person, place, and time. She appears well-developed and well-nourished. No distress.  HENT:  Head: Normocephalic and atraumatic.  Right Ear: External ear normal.  Left Ear: External ear normal.       Swelling and exudate in the nasal passageways.   Eyes: EOM are normal. Pupils are equal, round, and reactive to light.  Neck: Neck supple. No tracheal deviation present.  Cardiovascular: Normal rate.   Pulmonary/Chest: Effort normal. No respiratory distress. She has no wheezes. She has no rales.  Abdominal: There is no tenderness.  Musculoskeletal: Normal range of motion.       Tender over the base of left fifth metatarsal.  Swelling noted.  Sensation and motor function intact.    Lymphadenopathy:    She has no cervical adenopathy.  Neurological:  She is alert and oriented to person, place, and time.  Skin: Skin is warm and dry.  Psychiatric: She has a normal mood and affect. Her behavior is normal.    ED Course  Procedures  DIAGNOSTIC STUDIES: Oxygen Saturation is 98% on room air, normal by my interpretation.    COORDINATION OF CARE:  7:42 PM Will order xray of left foot.   7:44 PM Ordered: DG Foot Complete Left.   Results for orders placed during the hospital encounter of 07/18/11  CBC      Component Value Range   WBC 8.8  4.0 - 10.5 K/uL   RBC 4.50  3.87 - 5.11 MIL/uL   Hemoglobin 14.2  12.0 - 15.0 g/dL   HCT 95.6  21.3 - 08.6 %   MCV 92.4  78.0 - 100.0 fL   MCH 31.6  26.0 - 34.0 pg   MCHC 34.1  30.0 - 36.0 g/dL   RDW 57.8  46.9 - 62.9 %   Platelets 246  150 - 400 K/uL  BASIC METABOLIC PANEL      Component Value Range   Sodium 137  135 - 145 mEq/L   Potassium 4.2  3.5 - 5.1 mEq/L   Chloride 104  96 - 112 mEq/L   CO2 22  19 - 32 mEq/L   Glucose, Bld 99  70 - 99 mg/dL   BUN 15  6 - 23 mg/dL   Creatinine, Ser 5.28  0.50 - 1.10 mg/dL   Calcium 9.2  8.4 - 41.3 mg/dL   GFR calc non Af Amer >90  >90 mL/min   GFR calc Af Amer >90  >90 mL/min   Dg Foot Complete Left  02/16/2012  *RADIOLOGY REPORT*  Clinical Data: Injured left foot 4 days ago, persistent lateral foot pain.  LEFT FOOT - COMPLETE 3+ VIEW  Comparison: None.  Findings: No evidence of acute or subacute fracture or dislocation. Well-preserved joint spaces.  Well-preserved bone mineral density. Small plantar calcaneal spur.  No other intrinsic osseous abnormalities.  IMPRESSION: No acute osseous abnormality.  Small plantar calcaneal spur.   Original Report Authenticated By: Hulan Saas, M.D.    X-rays were negative.  Symptomatic treatment was advised.      1.  Foot pain      I personally performed the services described in this documentation, which was scribed in my presence. The recorded information has been reviewed and is accurate. Osvaldo Human, M.D.      Carleene Cooper III, MD 03/16/12 [phone]

## 2012-02-16 NOTE — ED Notes (Signed)
Pain to lateral aspect of left foot; no obvious trauma nor deformity noted at this time; able to ambulate with partial weight bearing

## 2012-02-16 NOTE — ED Notes (Signed)
Pt presents to department for evaluation of L foot pain. States she accidentally stepped in hole x4 days ago. 10/10 pain at the time, increases with movement. No obvious deformities noted. Pt is alert and oriented x4. No signs of distress noted.

## 2012-02-17 NOTE — ED Provider Notes (Signed)
Medical screening examination/treatment/procedure(s) were performed by non-physician practitioner and as supervising physician I was immediately available for consultation/collaboration.  John-Adam Dessie Delcarlo, M.D.     John-Adam Calum Cormier, MD 02/17/12 0231 

## 2012-07-17 ENCOUNTER — Emergency Department (HOSPITAL_COMMUNITY)
Admission: EM | Admit: 2012-07-17 | Discharge: 2012-07-17 | Disposition: A | Discharge status: Home / Self Care | Payer: Medicaid Other | Attending: Emergency Medicine | Admitting: Emergency Medicine

## 2012-07-17 ENCOUNTER — Encounter (HOSPITAL_COMMUNITY): Payer: Self-pay

## 2012-07-17 ENCOUNTER — Telehealth (HOSPITAL_COMMUNITY): Payer: Self-pay | Admitting: Emergency Medicine

## 2012-07-17 ENCOUNTER — Emergency Department (HOSPITAL_COMMUNITY): Payer: Medicaid Other

## 2012-07-17 DIAGNOSIS — Z79899 Other long term (current) drug therapy: Secondary | ICD-10-CM | POA: Insufficient documentation

## 2012-07-17 DIAGNOSIS — F17200 Nicotine dependence, unspecified, uncomplicated: Secondary | ICD-10-CM | POA: Insufficient documentation

## 2012-07-17 DIAGNOSIS — Z8659 Personal history of other mental and behavioral disorders: Secondary | ICD-10-CM | POA: Insufficient documentation

## 2012-07-17 DIAGNOSIS — M549 Dorsalgia, unspecified: Secondary | ICD-10-CM | POA: Insufficient documentation

## 2012-07-17 DIAGNOSIS — G8929 Other chronic pain: Secondary | ICD-10-CM | POA: Insufficient documentation

## 2012-07-17 DIAGNOSIS — J020 Streptococcal pharyngitis: Secondary | ICD-10-CM

## 2012-07-17 DIAGNOSIS — Z3202 Encounter for pregnancy test, result negative: Secondary | ICD-10-CM | POA: Insufficient documentation

## 2012-07-17 LAB — URINALYSIS, ROUTINE W REFLEX MICROSCOPIC
Bilirubin Urine: NEGATIVE
Glucose, UA: NEGATIVE mg/dL
Hgb urine dipstick: NEGATIVE
Ketones, ur: NEGATIVE mg/dL
Leukocytes, UA: NEGATIVE
Nitrite: NEGATIVE
Protein, ur: NEGATIVE mg/dL
Specific Gravity, Urine: 1.022 (ref 1.005–1.030)
Urobilinogen, UA: 0.2 mg/dL (ref 0.0–1.0)
pH: 5 (ref 5.0–8.0)

## 2012-07-17 LAB — CBC WITH DIFFERENTIAL/PLATELET
Basophils Absolute: 0 10*3/uL (ref 0.0–0.1)
Basophils Relative: 0 % (ref 0–1)
Eosinophils Absolute: 0.3 10*3/uL (ref 0.0–0.7)
Eosinophils Relative: 2 % (ref 0–5)
HCT: 39.6 % (ref 36.0–46.0)
Hemoglobin: 13.9 g/dL (ref 12.0–15.0)
Lymphocytes Relative: 8 % — ABNORMAL LOW (ref 12–46)
Lymphs Abs: 1 10*3/uL (ref 0.7–4.0)
MCH: 32.4 pg (ref 26.0–34.0)
MCHC: 35.1 g/dL (ref 30.0–36.0)
MCV: 92.3 fL (ref 78.0–100.0)
Monocytes Absolute: 1.3 10*3/uL — ABNORMAL HIGH (ref 0.1–1.0)
Monocytes Relative: 10 % (ref 3–12)
Neutro Abs: 10.5 10*3/uL — ABNORMAL HIGH (ref 1.7–7.7)
Neutrophils Relative %: 80 % — ABNORMAL HIGH (ref 43–77)
Platelets: 196 10*3/uL (ref 150–400)
RBC: 4.29 MIL/uL (ref 3.87–5.11)
RDW: 13.1 % (ref 11.5–15.5)
WBC: 13.1 10*3/uL — ABNORMAL HIGH (ref 4.0–10.5)

## 2012-07-17 LAB — POCT PREGNANCY, URINE: Preg Test, Ur: NEGATIVE

## 2012-07-17 LAB — POCT I-STAT, CHEM 8
BUN: 13 mg/dL (ref 6–23)
Calcium, Ion: 1.14 mmol/L (ref 1.12–1.23)
Chloride: 106 mEq/L (ref 96–112)
Creatinine, Ser: 0.7 mg/dL (ref 0.50–1.10)
Glucose, Bld: 98 mg/dL (ref 70–99)
HCT: 42 % (ref 36.0–46.0)
Hemoglobin: 14.3 g/dL (ref 12.0–15.0)
Potassium: 4 mEq/L (ref 3.5–5.1)
Sodium: 139 mEq/L (ref 135–145)
TCO2: 23 mmol/L (ref 0–100)

## 2012-07-17 LAB — RAPID STREP SCREEN (MED CTR MEBANE ONLY): Streptococcus, Group A Screen (Direct): NEGATIVE

## 2012-07-17 MED ORDER — IOHEXOL 300 MG/ML  SOLN
75.0000 mL | Freq: Once | INTRAMUSCULAR | Status: AC | PRN
  Administered 2012-07-17: 75 mL via INTRAVENOUS

## 2012-07-17 MED ORDER — HYDROMORPHONE HCL PF 1 MG/ML IJ SOLN
1.0000 mg | INTRAMUSCULAR | Status: AC
  Administered 2012-07-17: 1 mg via INTRAVENOUS
  Filled 2012-07-17: qty 1

## 2012-07-17 MED ORDER — ONDANSETRON HCL 4 MG/2ML IJ SOLN
4.0000 mg | Freq: Once | INTRAMUSCULAR | Status: AC
  Administered 2012-07-17: 4 mg via INTRAVENOUS
  Filled 2012-07-17: qty 2

## 2012-07-17 MED ORDER — MORPHINE SULFATE 4 MG/ML IJ SOLN
4.0000 mg | Freq: Once | INTRAMUSCULAR | Status: AC
  Administered 2012-07-17: 4 mg via INTRAVENOUS
  Filled 2012-07-17: qty 1

## 2012-07-17 MED ORDER — SODIUM CHLORIDE 0.9 % IV BOLUS (SEPSIS): 1000.0000 mL | Freq: Once | INTRAVENOUS | Status: DC

## 2012-07-17 MED ORDER — HYDROCODONE-ACETAMINOPHEN 7.5-500 MG/15ML PO SOLN: 10.0000 mL | ORAL | Status: DC | PRN

## 2012-07-17 MED ORDER — PENICILLIN G BENZATHINE & PROC 1200000 UNIT/2ML IM SUSP
1.2000 10*6.[IU] | Freq: Once | INTRAMUSCULAR | Status: AC
  Administered 2012-07-17: 1.2 10*6.[IU] via INTRAMUSCULAR
  Filled 2012-07-17: qty 2

## 2012-07-17 MED ORDER — SODIUM CHLORIDE 0.9 % IV BOLUS (SEPSIS)
1000.0000 mL | Freq: Once | INTRAVENOUS | Status: AC
  Administered 2012-07-17: 1000 mL via INTRAVENOUS

## 2012-07-17 MED ORDER — ONDANSETRON HCL 4 MG PO TABS: 4.0000 mg | ORAL_TABLET | Freq: Three times a day (TID) | ORAL | Status: DC | PRN

## 2012-07-17 MED ORDER — DEXAMETHASONE SODIUM PHOSPHATE 10 MG/ML IJ SOLN
10.0000 mg | Freq: Once | INTRAMUSCULAR | Status: AC
  Administered 2012-07-17: 10 mg via INTRAVENOUS
  Filled 2012-07-17: qty 1

## 2012-07-17 NOTE — ED Notes (Signed)
POCT urine Pregnancy results Negative. ED-Lab.

## 2012-07-17 NOTE — ED Notes (Addendum)
Care transferred, report received Lafonda Mosses, RN.

## 2012-07-17 NOTE — ED Provider Notes (Signed)
History     CSN: [id number]  Arrival date & time 07/17/12  0610   First MD Initiated Contact with Patient 07/17/12 [phone]      Chief Complaint  Patient presents with  . Sore Throat    (Consider location/radiation/quality/duration/timing/severity/associated sxs/prior treatment) HPI Comments: Patient p/w sore throat that began yesterday.  Pain became worse today, right side worse than left.  Pain is constant, sore, radiating to the right neck and right ear, and worse with swallowing.  Today she developed body aches, chills.  Has had mild nasal congestion and occasional cough productive of green sputum.  Occasional sharp chest pains.  Increased urination.  Few episodes of loose stools.  One episode of lightheadedness.  Denies SOB, dysuria or urinary urgency.    The history is provided by the patient.    Past Medical History  Diagnosis Date  . Back pain, chronic   . Depressed bipolar I disorder     History reviewed. No pertinent past surgical history.  No family history on file.  History  Substance Use Topics  . Smoking status: Current Every Day Smoker -- 0.20 packs/day  . Smokeless tobacco: Not on file  . Alcohol Use: 0.6 oz/week    1 Cans of beer per week    OB History   Grav Para Term Preterm Abortions TAB SAB Ect Mult Living                  Review of Systems  Constitutional: Positive for fever and chills.  HENT: Positive for ear pain, congestion, sore throat and neck pain. Negative for neck stiffness.   Respiratory: Positive for cough. Negative for shortness of breath.   Gastrointestinal: Negative for nausea, vomiting, abdominal pain and diarrhea.  Genitourinary: Positive for frequency. Negative for dysuria, urgency, vaginal bleeding, vaginal discharge and menstrual problem.  Musculoskeletal: Positive for myalgias.  Neurological: Positive for light-headedness. Negative for dizziness.    Allergies  Darvocet and Miconazole nitrate  Home Medications   Current  Outpatient Rx  Name  Route  Sig  Dispense  Refill  . acetaminophen (TYLENOL) 500 MG tablet   Oral   Take 500 mg by mouth every 6 (six) hours as needed. For pain         . albuterol (PROVENTIL HFA;VENTOLIN HFA) 108 (90 BASE) MCG/ACT inhaler   Inhalation   Inhale 2 puffs into the lungs every 4 (four) hours as needed. For shortness of breath         . diphenhydrAMINE (BENADRYL) 25 mg capsule   Oral   Take 25 mg by mouth every 6 (six) hours as needed. For allergies         . fluticasone (FLONASE) 50 MCG/ACT nasal spray   Nasal   Place 2 sprays into the nose daily as needed. Nasal congestion         . loratadine (CLARITIN) 10 MG tablet   Oral   Take 10 mg by mouth daily as needed. allergies         . methocarbamol (ROBAXIN) 500 MG tablet   Oral   Take 500 mg by mouth 4 (four) times daily as needed. For muscle spasms         . oxyCODONE-acetaminophen (PERCOCET) 5-325 MG per tablet   Oral   Take 1 tablet by mouth every 4 (four) hours as needed. For pain.         . pseudoephedrine (SUDAFED) 60 MG tablet   Oral   Take 60 mg by mouth  every 4 (four) hours as needed. For cold symptoms         . zolpidem (AMBIEN) 10 MG tablet   Oral   Take 10 mg by mouth at bedtime as needed. For sleep           BP 117/50  Pulse 85  Temp(Src) 99.9 F (37.7 C) (Oral)  Resp 16  SpO2 100%  Physical Exam  Nursing note and vitals reviewed. Constitutional: She appears well-developed and well-nourished. No distress.  HENT:  Head: Normocephalic and atraumatic.  Mouth/Throat: Oropharyngeal exudate, posterior oropharyngeal edema, posterior oropharyngeal erythema and tonsillar abscesses present.  Tonsils are enlarged, R>L with exudate.    Eyes: Conjunctivae are normal.  Neck: Neck supple.  Cardiovascular: Normal rate and regular rhythm.   Pulmonary/Chest: Effort normal and breath sounds normal. No stridor. No respiratory distress. She has no wheezes. She has no rales.   Lymphadenopathy:    She has cervical adenopathy.  Neurological: She is alert.  Skin: She is not diaphoretic.    ED Course  Procedures (including critical care time)  Labs Reviewed  CBC WITH DIFFERENTIAL - Abnormal; Notable for the following:    WBC 13.1 (*)    Neutrophils Relative 80 (*)    Neutro Abs 10.5 (*)    Lymphocytes Relative 8 (*)    Monocytes Absolute 1.3 (*)    All other components within normal limits  RAPID STREP SCREEN  URINALYSIS, ROUTINE W REFLEX MICROSCOPIC  POCT I-STAT, CHEM 8   Ct Soft Tissue Neck W Contrast  07/17/2012  *RADIOLOGY REPORT*  Clinical Data: Sore throat and neck swelling.  CT NECK WITH CONTRAST  Technique:  Multidetector CT imaging of the neck was performed with intravenous contrast.  Contrast: 75mL OMNIPAQUE IOHEXOL 300 MG/ML  SOLN  Comparison: None.  Findings: Inflammatory process of the tonsils noted with diffuse tonsillar enlargement and more focal inflammatory change and ill- defined fluid in the right tonsillar pillar.  There is no evidence of focal drainable abscess.  Calcifications in the left tonsillar pillar are present implying previous inflammation.  There is no significant airway compromise at this time by CT.  No extension is noted into the retropharyngeal space or laterally beyond the tonsils.  Some small reactive cervical nodes are present bilaterally.  Opacified vessels are unremarkable.  Mucosal thickening and probable mucous retention cyst are noted in the right maxillary sinus.  There is a mild amount of mucosal thickening in the left maxillary antrum.  IMPRESSION:  1.  Acute inflammatory changes involving the right tonsil without evidence of focal drainable abscess.  Calcifications in the left tonsil implicate previous inflammation. 2.  Chronic appearing bilateral maxillary sinus disease.   Original Report Authenticated By: Irish Lack, M.D.     Urine pregnancy is negative.   1. Strep pharyngitis      MDM  6:42 AM Pt with sore  throat that began yesterday, chills and myalgias today.  Tonsilar enlargement R>L, suspicious for peritonsillar abscess.  IVF, pain medication, abx (IM penicillin), labs, CT neck ordered.  Pt is tolerating oral secretions presently.  No airway concerns.  Treating as strep infection empirically per [facility] Criteria: +tonsillar exudates, +anterior cervical lymphadenopthy, +fever, +absence of true cough (no coughing in room during visit) 9:56 AM CT is negative for abscess.  Pt is tolerating PO fluids.  No airway concerns.  Pt to be d/c home with lortab elixir, zofran.  Discussed all results with patient.  Pt given return precautions.  Pt verbalizes understanding and agrees  with plan.           [place], PA-C 07/17/12 [phone]

## 2012-07-17 NOTE — ED Notes (Signed)
Pt. Reports sore throat since yesterday.

## 2012-07-18 NOTE — ED Provider Notes (Signed)
Medical screening examination/treatment/procedure(s) were performed by non-physician practitioner and as supervising physician I was immediately available for consultation/collaboration.  Clem Wisenbaker, MD 07/18/12 0221 

## 2012-07-25 ENCOUNTER — Encounter (HOSPITAL_COMMUNITY): Payer: Self-pay | Admitting: Emergency Medicine

## 2012-07-25 ENCOUNTER — Emergency Department (HOSPITAL_COMMUNITY)
Admission: EM | Admit: 2012-07-25 | Discharge: 2012-07-26 | Disposition: A | Discharge status: Home / Self Care | Payer: Medicaid Other | Attending: Emergency Medicine | Admitting: Emergency Medicine

## 2012-07-25 DIAGNOSIS — G8929 Other chronic pain: Secondary | ICD-10-CM | POA: Insufficient documentation

## 2012-07-25 DIAGNOSIS — J029 Acute pharyngitis, unspecified: Secondary | ICD-10-CM | POA: Insufficient documentation

## 2012-07-25 DIAGNOSIS — Z3202 Encounter for pregnancy test, result negative: Secondary | ICD-10-CM | POA: Insufficient documentation

## 2012-07-25 DIAGNOSIS — J36 Peritonsillar abscess: Secondary | ICD-10-CM | POA: Insufficient documentation

## 2012-07-25 DIAGNOSIS — F17200 Nicotine dependence, unspecified, uncomplicated: Secondary | ICD-10-CM | POA: Insufficient documentation

## 2012-07-25 DIAGNOSIS — F319 Bipolar disorder, unspecified: Secondary | ICD-10-CM | POA: Insufficient documentation

## 2012-07-25 DIAGNOSIS — Z79899 Other long term (current) drug therapy: Secondary | ICD-10-CM | POA: Insufficient documentation

## 2012-07-25 DIAGNOSIS — M542 Cervicalgia: Secondary | ICD-10-CM | POA: Insufficient documentation

## 2012-07-25 LAB — CBC WITH DIFFERENTIAL/PLATELET
Basophils Absolute: 0 10*3/uL (ref 0.0–0.1)
Basophils Relative: 0 % (ref 0–1)
Eosinophils Absolute: 0.2 10*3/uL (ref 0.0–0.7)
Eosinophils Relative: 2 % (ref 0–5)
HCT: 39.3 % (ref 36.0–46.0)
Hemoglobin: 13.8 g/dL (ref 12.0–15.0)
Lymphocytes Relative: 18 % (ref 12–46)
Lymphs Abs: 2.1 10*3/uL (ref 0.7–4.0)
MCH: 32.4 pg (ref 26.0–34.0)
MCHC: 35.1 g/dL (ref 30.0–36.0)
MCV: 92.3 fL (ref 78.0–100.0)
Monocytes Absolute: 1.3 10*3/uL — ABNORMAL HIGH (ref 0.1–1.0)
Monocytes Relative: 11 % (ref 3–12)
Neutro Abs: 7.9 10*3/uL — ABNORMAL HIGH (ref 1.7–7.7)
Neutrophils Relative %: 69 % (ref 43–77)
Platelets: 297 10*3/uL (ref 150–400)
RBC: 4.26 MIL/uL (ref 3.87–5.11)
RDW: 12.9 % (ref 11.5–15.5)
WBC: 11.6 10*3/uL — ABNORMAL HIGH (ref 4.0–10.5)

## 2012-07-25 LAB — POCT I-STAT, CHEM 8
BUN: 15 mg/dL (ref 6–23)
Calcium, Ion: 1.21 mmol/L (ref 1.12–1.23)
Chloride: 106 mEq/L (ref 96–112)
Creatinine, Ser: 0.7 mg/dL (ref 0.50–1.10)
Glucose, Bld: 95 mg/dL (ref 70–99)
HCT: 41 % (ref 36.0–46.0)
Hemoglobin: 13.9 g/dL (ref 12.0–15.0)
Potassium: 3.7 mEq/L (ref 3.5–5.1)
Sodium: 140 mEq/L (ref 135–145)
TCO2: 25 mmol/L (ref 0–100)

## 2012-07-25 NOTE — ED Notes (Addendum)
Patient with jaw, gum and neck swelling. Patient states she was here last week for same, getting worse.  Patient states she is having a hard time opening mouth due to the swelling.  Patient did have scan and strep test last week, negative results on each.  Patient states that it is only on the right side of mouth.  Patient denies any difficulty breath.  Patient does have some trouble swallowing, able to control secretions.

## 2012-07-26 ENCOUNTER — Emergency Department (HOSPITAL_COMMUNITY): Payer: Medicaid Other

## 2012-07-26 ENCOUNTER — Encounter (HOSPITAL_COMMUNITY): Payer: Self-pay | Admitting: Radiology

## 2012-07-26 LAB — POCT I-STAT, CHEM 8
BUN: 15 mg/dL (ref 6–23)
Calcium, Ion: 1.07 mmol/L — ABNORMAL LOW (ref 1.12–1.23)
Chloride: 106 mEq/L (ref 96–112)
Creatinine, Ser: 0.8 mg/dL (ref 0.50–1.10)
Glucose, Bld: 87 mg/dL (ref 70–99)
HCT: 41 % (ref 36.0–46.0)
Hemoglobin: 13.9 g/dL (ref 12.0–15.0)
Potassium: 3.7 mEq/L (ref 3.5–5.1)
Sodium: 140 mEq/L (ref 135–145)
TCO2: 25 mmol/L (ref 0–100)

## 2012-07-26 LAB — POCT PREGNANCY, URINE: Preg Test, Ur: NEGATIVE

## 2012-07-26 MED ORDER — OXYCODONE-ACETAMINOPHEN 5-325 MG PO TABS
1.0000 | ORAL_TABLET | Freq: Once | ORAL | Status: AC
  Administered 2012-07-26: 1 via ORAL
  Filled 2012-07-26: qty 1

## 2012-07-26 MED ORDER — CLINDAMYCIN PHOSPHATE 600 MG/50ML IV SOLN
600.0000 mg | Freq: Once | INTRAVENOUS | Status: AC
  Administered 2012-07-26: 600 mg via INTRAVENOUS
  Filled 2012-07-26: qty 50

## 2012-07-26 MED ORDER — DEXAMETHASONE SODIUM PHOSPHATE 10 MG/ML IJ SOLN
10.0000 mg | Freq: Once | INTRAMUSCULAR | Status: AC
  Administered 2012-07-26: 10 mg via INTRAVENOUS
  Filled 2012-07-26: qty 1

## 2012-07-26 MED ORDER — HYDROCODONE-ACETAMINOPHEN 7.5-500 MG/15ML PO SOLN: 15.0000 mL | Freq: Four times a day (QID) | ORAL | Status: DC | PRN

## 2012-07-26 MED ORDER — CLINDAMYCIN HCL 150 MG PO CAPS: 150.0000 mg | ORAL_CAPSULE | Freq: Four times a day (QID) | ORAL | Status: DC

## 2012-07-26 MED ORDER — SODIUM CHLORIDE 0.9 % IV BOLUS (SEPSIS)
1000.0000 mL | Freq: Once | INTRAVENOUS | Status: AC
  Administered 2012-07-26: 1000 mL via INTRAVENOUS

## 2012-07-26 MED ORDER — MORPHINE SULFATE 4 MG/ML IJ SOLN
4.0000 mg | Freq: Once | INTRAMUSCULAR | Status: AC
  Administered 2012-07-26: 4 mg via INTRAVENOUS
  Filled 2012-07-26: qty 1

## 2012-07-26 MED ORDER — IOHEXOL 300 MG/ML  SOLN
75.0000 mL | Freq: Once | INTRAMUSCULAR | Status: AC | PRN
  Administered 2012-07-26: 75 mL via INTRAVENOUS

## 2012-07-26 MED ORDER — CLINDAMYCIN HCL 300 MG PO CAPS: 300.0000 mg | ORAL_CAPSULE | Freq: Four times a day (QID) | ORAL | Status: DC

## 2012-07-26 MED ORDER — FENTANYL CITRATE 0.05 MG/ML IJ SOLN
50.0000 ug | Freq: Once | INTRAMUSCULAR | Status: AC
  Administered 2012-07-26: 50 ug via INTRAVENOUS
  Filled 2012-07-26: qty 2

## 2012-07-26 NOTE — Consult Note (Signed)
Reason for Consult: Sore throat Referring Physician: Emergency room  Marissa Smith is an 27 y.o. female.  HPI: 27 year old with a one-week history of sore throat. She was seen in the emergency room given clindamycin about a week ago. She improved. On approximately Thursday she began having a sore throat again. He did not get significant with right ear pain until 1-2 days ago. She's now here with a CT scan that shows a right peritonsillar abscess. She has no stridor or breathing difficulties. No bleeding disorders.  Past Medical History  Diagnosis Date  . Back pain, chronic   . Depressed bipolar I disorder     History reviewed. No pertinent past surgical history.  History reviewed. No pertinent family history.  Social History:  reports that she has been smoking.  She does not have any smokeless tobacco history on file. She reports that she drinks about 0.6 ounces of alcohol per week. She reports that she does not use illicit drugs.  Allergies:  Allergies  Allergen Reactions  . Darvocet (Propoxyphene-Acetaminophen) Nausea Only and Other (See Comments)    dizziness  . Miconazole Nitrate Swelling and Rash    Cream caused swelling and rash    Medications: I have reviewed the patient's current medications.  Results for orders placed during the hospital encounter of 07/25/12 (from the past 48 hour(s))  CBC WITH DIFFERENTIAL     Status: Abnormal   Collection Time    07/25/12  8:55 PM      Result Value Range   WBC 11.6 (*) 4.0 - 10.5 K/uL   RBC 4.26  3.87 - 5.11 MIL/uL   Hemoglobin 13.8  12.0 - 15.0 g/dL   HCT 16.1  09.6 - 04.5 %   MCV 92.3  78.0 - 100.0 fL   MCH 32.4  26.0 - 34.0 pg   MCHC 35.1  30.0 - 36.0 g/dL   RDW 40.9  81.1 - 91.4 %   Platelets 297  150 - 400 K/uL   Neutrophils Relative 69  43 - 77 %   Neutro Abs 7.9 (*) 1.7 - 7.7 K/uL   Lymphocytes Relative 18  12 - 46 %   Lymphs Abs 2.1  0.7 - 4.0 K/uL   Monocytes Relative 11  3 - 12 %   Monocytes Absolute 1.3 (*) 0.1 -  1.0 K/uL   Eosinophils Relative 2  0 - 5 %   Eosinophils Absolute 0.2  0.0 - 0.7 K/uL   Basophils Relative 0  0 - 1 %   Basophils Absolute 0.0  0.0 - 0.1 K/uL  POCT I-STAT, CHEM 8     Status: None   Collection Time    07/25/12  9:36 PM      Result Value Range   Sodium 140  135 - 145 mEq/L   Potassium 3.7  3.5 - 5.1 mEq/L   Chloride 106  96 - 112 mEq/L   BUN 15  6 - 23 mg/dL   Creatinine, Ser 7.82  0.50 - 1.10 mg/dL   Glucose, Bld 95  70 - 99 mg/dL   Calcium, Ion 9.56  2.13 - 1.23 mmol/L   TCO2 25  0 - 100 mmol/L   Hemoglobin 13.9  12.0 - 15.0 g/dL   HCT 08.6  57.8 - 46.9 %  POCT PREGNANCY, URINE     Status: None   Collection Time    07/26/12 12:30 AM      Result Value Range   Preg Test, Ur NEGATIVE  NEGATIVE   Comment:            THE SENSITIVITY OF THIS     METHODOLOGY IS >24 mIU/mL  POCT I-STAT, CHEM 8     Status: Abnormal   Collection Time    07/26/12 12:32 AM      Result Value Range   Sodium 140  135 - 145 mEq/L   Potassium 3.7  3.5 - 5.1 mEq/L   Chloride 106  96 - 112 mEq/L   BUN 15  6 - 23 mg/dL   Creatinine, Ser 7.82  0.50 - 1.10 mg/dL   Glucose, Bld 87  70 - 99 mg/dL   Calcium, Ion 9.56 (*) 1.12 - 1.23 mmol/L   TCO2 25  0 - 100 mmol/L   Hemoglobin 13.9  12.0 - 15.0 g/dL   HCT 21.3  08.6 - 57.8 %    Ct Soft Tissue Neck W Contrast  07/26/2012  *RADIOLOGY REPORT*  Clinical Data: Neck and right-sided facial swelling.  CT NECK WITH CONTRAST  Technique:  Multidetector CT imaging of the neck was performed with intravenous contrast.  Contrast: 75mL OMNIPAQUE IOHEXOL 300 MG/ML  SOLN  Comparison: CT of the neck performed 07/17/2012  Findings: There is a focal fluid collection within the right palatine tonsil, measuring approximately 2.0 x 1.9 x 1.8 cm, compatible with a focal abscess.  Significant surrounding edema is noted, displacing the adjacent uvula and extending superiorly to the inferior aspect of the nasopharynx.  This results in partial effacement of the oropharynx  and upper hypopharynx.  There is mild haziness within the parapharyngeal fat planes on the right side.  The nasopharynx is grossly unremarkable in appearance.  The more inferior hypopharynx is within normal limits.  The valleculae and piriform sinuses appear intact. Scattered calcifications about the left palatine tonsil reflect tonsilloliths.  Scattered cervical nodes remain borderline normal in size.  No definite cervical lymphadenopathy is seen.  The parotid and submandibular glands are unremarkable in appearance.  There is no evidence of vascular compromise.  There is no evidence of extension of inflammation to the floor of the mouth.  A prominent mucus retention cyst or polyp is noted within the right maxillary sinus.  The remaining visualized paranasal sinuses and mastoid air cells are well-aerated.  The visualized portions of the brain are unremarkable in appearance.  The orbits are within normal limits.  The visualized lung apices appear grossly clear.  The visualized mediastinum is grossly unremarkable in appearance.  No acute osseous abnormalities are seen.  IMPRESSION:  1.  Focal 2.0 x 1.9 x 1.8 cm abscess within the right palatine tonsil, with significant surrounding edema; edema displaces the adjacent uvula and extends superiorly to the inferior aspect of the nasopharynx.  This results in partial effacement of the oropharynx and upper hypopharynx, with mild haziness in the parapharyngeal fat planes on the right. 2.  Scattered tonsilloliths noted about the left palatine tonsil. 3.  Prominent mucus retention cyst or polyp within the right maxillary sinus.  These results were called by telephone on 07/26/2012 at 01:21 a.m. to Dr. Deanna Artis, who verbally acknowledged these results.   Original Report Authenticated By: Tonia [place], M.D.     Review of Systems  Constitutional: Negative.   HENT: Positive for sore throat.   Eyes: Negative.   Respiratory: Negative.   Cardiovascular: Negative.    Skin: Negative.    Blood pressure 116/70, pulse 88, temperature 98.7 F (37.1 C), temperature source Oral, resp. rate 18, last menstrual  period 07/26/2009, SpO2 96.00%. Physical Exam  Constitutional: She appears well-developed.  HENT:  Nose: Nose normal.  Some trismus with a swelling of the right soft palate and bulging with some erythema of the mucosa. The uvula slightly deviated to the left. There is no stridor or airway obstruction.  Eyes: Pupils are equal, round, and reactive to light.  Neck: Normal range of motion.  Cardiovascular: Normal rate.   Respiratory: Effort normal.  GI: Soft.    Assessment/Plan: Right peritonsillar abscess-she definitely by clinical exam has an abscess with bulging of the right peritonsillar region. I discussed with her about options and incision and drainage. We discussed the procedure including risks, benefits, and options and all her questions are answered and consent was obtained. The mouth was sprayed with Cetacaine. She then had injection of 1% lidocaine with 1 100,000 epinephrine in the peritonsillar bulging area. An incision was made with an 11 blade with immediate pus expressed. A tonsil hemostat was then placed to completely evacuate the space. She had minimal bleeding and tolerated this procedure well. She will be discharged on antibiotics with clindamycin 300 mg 3 times a day and some Lortab elixir. She will followup in one week sooner if she is not improving to almost resolution within 24-48 hours.  Suzanna Obey 07/26/2012, 2:24 AM

## 2012-07-26 NOTE — ED Notes (Signed)
Combined ENT Tray placed in room

## 2012-07-26 NOTE — ED Provider Notes (Signed)
History     CSN: [id number]  Arrival date & time 07/25/12  2047   First MD Initiated Contact with Patient 07/26/12 0002      Chief Complaint  Patient presents with  . Oral Swelling    (Consider location/radiation/quality/duration/timing/severity/associated sxs/prior treatment) Patient is a 27 y.o. female presenting with pharyngitis. The history is provided by the patient. No language interpreter was used.  Sore Throat This is a new problem. The current episode started more than 1 week ago. The problem occurs constantly. The problem has been gradually worsening. Pertinent negatives include no chest pain, no abdominal pain, no headaches and no shortness of breath. Nothing aggravates the symptoms. Nothing relieves the symptoms. She has tried nothing for the symptoms. The treatment provided no relief.  Now has right neck pain and trismus and oral swelling.     Past Medical History  Diagnosis Date  . Back pain, chronic   . Depressed bipolar I disorder     History reviewed. No pertinent past surgical history.  History reviewed. No pertinent family history.  History  Substance Use Topics  . Smoking status: Current Every Day Smoker -- 0.20 packs/day  . Smokeless tobacco: Not on file  . Alcohol Use: 0.6 oz/week    1 Cans of beer per week    OB History   Grav Para Term Preterm Abortions TAB SAB Ect Mult Living                  Review of Systems  Constitutional: Negative for fever.  HENT: Positive for sore throat and neck pain. Negative for drooling and neck stiffness.   Respiratory: Negative for shortness of breath.   Cardiovascular: Negative for chest pain.  Gastrointestinal: Negative for abdominal pain.  Neurological: Negative for headaches.  All other systems reviewed and are negative.    Allergies  Darvocet and Miconazole nitrate  Home Medications   Current Outpatient Rx  Name  Route  Sig  Dispense  Refill  . albuterol (PROVENTIL HFA;VENTOLIN HFA) 108 (90 BASE)  MCG/ACT inhaler   Inhalation   Inhale 2 puffs into the lungs every 4 (four) hours as needed. For shortness of breath         . HYDROcodone-acetaminophen (LORTAB) 7.5-500 MG/15ML solution   Oral   Take 10-15 mLs by mouth every 4 (four) hours as needed for pain.   200 mL   0   . levonorgestrel (MIRENA) 20 MCG/24HR IUD   Intrauterine   1 each by Intrauterine route once.         . ondansetron (ZOFRAN) 4 MG tablet   Oral   Take 1 tablet (4 mg total) by mouth every 8 (eight) hours as needed for nausea.   12 tablet   0   . Pseudoeph-Doxylamine-DM-APAP (NYQUIL PO)   Oral   Take 2 capsules by mouth every 6 (six) hours as needed (congestion, sore throat and pain).         . Pseudoephedrine-APAP-DM (DAYQUIL PO)   Oral   Take 2 capsules by mouth every 6 (six) hours as needed (congestion, sore throat, pain).         Marland Kitchen zolpidem (AMBIEN) 10 MG tablet   Oral   Take 10 mg by mouth at bedtime as needed. For sleep           BP 116/70  Pulse 88  Temp(Src) 98.7 F (37.1 C) (Oral)  Resp 18  SpO2 96%  LMP 07/26/2009  Physical Exam  Constitutional: She is oriented  to person, place, and time. She appears well-developed and well-nourished. No distress.  HENT:  Head: Normocephalic and atraumatic.  Trismus with oral pharyngeal and tonsillar swelling on right   Eyes: Conjunctivae are normal. Pupils are equal, round, and reactive to light.  Neck: Normal range of motion. Neck supple. No tracheal deviation present.  Cardiovascular: Normal rate, regular rhythm and intact distal pulses.   Pulmonary/Chest: Effort normal and breath sounds normal. She has no wheezes. She has no rales.  Abdominal: Soft. Bowel sounds are normal. There is no tenderness. There is no rebound and no guarding.  Musculoskeletal: Normal range of motion.  Neurological: She is alert and oriented to person, place, and time.  Skin: Skin is warm and dry.  Psychiatric: She has a normal mood and affect.    ED Course   Procedures (including critical care time)  Labs Reviewed  CBC WITH DIFFERENTIAL - Abnormal; Notable for the following:    WBC 11.6 (*)    Neutro Abs 7.9 (*)    Monocytes Absolute 1.3 (*)    All other components within normal limits  POCT I-STAT, CHEM 8 - Abnormal; Notable for the following:    Calcium, Ion 1.07 (*)    All other components within normal limits  POCT I-STAT, CHEM 8  POCT PREGNANCY, URINE   Ct Soft Tissue Neck W Contrast  07/26/2012  *RADIOLOGY REPORT*  Clinical Data: Neck and right-sided facial swelling.  CT NECK WITH CONTRAST  Technique:  Multidetector CT imaging of the neck was performed with intravenous contrast.  Contrast: 75mL OMNIPAQUE IOHEXOL 300 MG/ML  SOLN  Comparison: CT of the neck performed 07/17/2012  Findings: There is a focal fluid collection within the right palatine tonsil, measuring approximately 2.0 x 1.9 x 1.8 cm, compatible with a focal abscess.  Significant surrounding edema is noted, displacing the adjacent uvula and extending superiorly to the inferior aspect of the nasopharynx.  This results in partial effacement of the oropharynx and upper hypopharynx.  There is mild haziness within the parapharyngeal fat planes on the right side.  The nasopharynx is grossly unremarkable in appearance.  The more inferior hypopharynx is within normal limits.  The valleculae and piriform sinuses appear intact. Scattered calcifications about the left palatine tonsil reflect tonsilloliths.  Scattered cervical nodes remain borderline normal in size.  No definite cervical lymphadenopathy is seen.  The parotid and submandibular glands are unremarkable in appearance.  There is no evidence of vascular compromise.  There is no evidence of extension of inflammation to the floor of the mouth.  A prominent mucus retention cyst or polyp is noted within the right maxillary sinus.  The remaining visualized paranasal sinuses and mastoid air cells are well-aerated.  The visualized portions of  the brain are unremarkable in appearance.  The orbits are within normal limits.  The visualized lung apices appear grossly clear.  The visualized mediastinum is grossly unremarkable in appearance.  No acute osseous abnormalities are seen.  IMPRESSION:  1.  Focal 2.0 x 1.9 x 1.8 cm abscess within the right palatine tonsil, with significant surrounding edema; edema displaces the adjacent uvula and extends superiorly to the inferior aspect of the nasopharynx.  This results in partial effacement of the oropharynx and upper hypopharynx, with mild haziness in the parapharyngeal fat planes on the right. 2.  Scattered tonsilloliths noted about the left palatine tonsil. 3.  Prominent mucus retention cyst or polyp within the right maxillary sinus.  These results were called by telephone on 07/26/2012 at 01:21  a.m. to Dr. Deanna Artis, who verbally acknowledged these results.   Original Report Authenticated By: Tonia Ghent, M.D.      No diagnosis found.    MDM  Case d/w Dr. Jearld Fenton of ENT who will see the patient   Medications  sodium chloride 0.9 % bolus 1,000 mL (0 mLs Intravenous Stopped 07/26/12 0139)  clindamycin (CLEOCIN) IVPB 600 mg (0 mg Intravenous Stopped 07/26/12 0138)  dexamethasone (DECADRON) injection 10 mg (10 mg Intravenous Given 07/26/12 0037)  fentaNYL (SUBLIMAZE) injection 50 mcg (50 mcg Intravenous Given 07/26/12 0034)  iohexol (OMNIPAQUE) 300 MG/ML solution 75 mL (75 mLs Intravenous Contrast Given 07/26/12 0057)  morphine 4 MG/ML injection 4 mg (4 mg Intravenous Given 07/26/12 0136)   Results for orders placed during the hospital encounter of 07/25/12  CBC WITH DIFFERENTIAL      Result Value Range   WBC 11.6 (*) 4.0 - 10.5 K/uL   RBC 4.26  3.87 - 5.11 MIL/uL   Hemoglobin 13.8  12.0 - 15.0 g/dL   HCT 09.6  04.5 - 40.9 %   MCV 92.3  78.0 - 100.0 fL   MCH 32.4  26.0 - 34.0 pg   MCHC 35.1  30.0 - 36.0 g/dL   RDW 81.1  91.4 - 78.2 %   Platelets 297  150 - 400 K/uL   Neutrophils  Relative 69  43 - 77 %   Neutro Abs 7.9 (*) 1.7 - 7.7 K/uL   Lymphocytes Relative 18  12 - 46 %   Lymphs Abs 2.1  0.7 - 4.0 K/uL   Monocytes Relative 11  3 - 12 %   Monocytes Absolute 1.3 (*) 0.1 - 1.0 K/uL   Eosinophils Relative 2  0 - 5 %   Eosinophils Absolute 0.2  0.0 - 0.7 K/uL   Basophils Relative 0  0 - 1 %   Basophils Absolute 0.0  0.0 - 0.1 K/uL  POCT I-STAT, CHEM 8      Result Value Range   Sodium 140  135 - 145 mEq/L   Potassium 3.7  3.5 - 5.1 mEq/L   Chloride 106  96 - 112 mEq/L   BUN 15  6 - 23 mg/dL   Creatinine, Ser 9.56  0.50 - 1.10 mg/dL   Glucose, Bld 95  70 - 99 mg/dL   Calcium, Ion 2.13  0.86 - 1.23 mmol/L   TCO2 25  0 - 100 mmol/L   Hemoglobin 13.9  12.0 - 15.0 g/dL   HCT 57.8  46.9 - 62.9 %  POCT PREGNANCY, URINE      Result Value Range   Preg Test, Ur NEGATIVE  NEGATIVE  POCT I-STAT, CHEM 8      Result Value Range   Sodium 140  135 - 145 mEq/L   Potassium 3.7  3.5 - 5.1 mEq/L   Chloride 106  96 - 112 mEq/L   BUN 15  6 - 23 mg/dL   Creatinine, Ser 5.28  0.50 - 1.10 mg/dL   Glucose, Bld 87  70 - 99 mg/dL   Calcium, Ion 4.13 (*) 1.12 - 1.23 mmol/L   TCO2 25  0 - 100 mmol/L   Hemoglobin 13.9  12.0 - 15.0 g/dL   HCT 24.4  01.0 - 27.2 %   Ct Soft Tissue Neck W Contrast  07/26/2012  *RADIOLOGY REPORT*  Clinical Data: Neck and right-sided facial swelling.  CT NECK WITH CONTRAST  Technique:  Multidetector CT imaging of the neck was performed with  intravenous contrast.  Contrast: 75mL OMNIPAQUE IOHEXOL 300 MG/ML  SOLN  Comparison: CT of the neck performed 07/17/2012  Findings: There is a focal fluid collection within the right palatine tonsil, measuring approximately 2.0 x 1.9 x 1.8 cm, compatible with a focal abscess.  Significant surrounding edema is noted, displacing the adjacent uvula and extending superiorly to the inferior aspect of the nasopharynx.  This results in partial effacement of the oropharynx and upper hypopharynx.  There is mild haziness within the  parapharyngeal fat planes on the right side.  The nasopharynx is grossly unremarkable in appearance.  The more inferior hypopharynx is within normal limits.  The valleculae and piriform sinuses appear intact. Scattered calcifications about the left palatine tonsil reflect tonsilloliths.  Scattered cervical nodes remain borderline normal in size.  No definite cervical lymphadenopathy is seen.  The parotid and submandibular glands are unremarkable in appearance.  There is no evidence of vascular compromise.  There is no evidence of extension of inflammation to the floor of the mouth.  A prominent mucus retention cyst or polyp is noted within the right maxillary sinus.  The remaining visualized paranasal sinuses and mastoid air cells are well-aerated.  The visualized portions of the brain are unremarkable in appearance.  The orbits are within normal limits.  The visualized lung apices appear grossly clear.  The visualized mediastinum is grossly unremarkable in appearance.  No acute osseous abnormalities are seen.  IMPRESSION:  1.  Focal 2.0 x 1.9 x 1.8 cm abscess within the right palatine tonsil, with significant surrounding edema; edema displaces the adjacent uvula and extends superiorly to the inferior aspect of the nasopharynx.  This results in partial effacement of the oropharynx and upper hypopharynx, with mild haziness in the parapharyngeal fat planes on the right. 2.  Scattered tonsilloliths noted about the left palatine tonsil. 3.  Prominent mucus retention cyst or polyp within the right maxillary sinus.  These results were called by telephone on 07/26/2012 at 01:21 a.m. to Dr. Deanna Artis, who verbally acknowledged these results.   Original Report Authenticated By: Tonia Ghent, M.D.    Ct Soft Tissue Neck W Contrast  07/17/2012  *RADIOLOGY REPORT*  Clinical Data: Sore throat and neck swelling.  CT NECK WITH CONTRAST  Technique:  Multidetector CT imaging of the neck was performed with intravenous  contrast.  Contrast: 75mL OMNIPAQUE IOHEXOL 300 MG/ML  SOLN  Comparison: None.  Findings: Inflammatory process of the tonsils noted with diffuse tonsillar enlargement and more focal inflammatory change and ill- defined fluid in the right tonsillar pillar.  There is no evidence of focal drainable abscess.  Calcifications in the left tonsillar pillar are present implying previous inflammation.  There is no significant airway compromise at this time by CT.  No extension is noted into the retropharyngeal space or laterally beyond the tonsils.  Some small reactive cervical nodes are present bilaterally.  Opacified vessels are unremarkable.  Mucosal thickening and probable mucous retention cyst are noted in the right maxillary sinus.  There is a mild amount of mucosal thickening in the left maxillary antrum.  IMPRESSION:  1.  Acute inflammatory changes involving the right tonsil without evidence of focal drainable abscess.  Calcifications in the left tonsil implicate previous inflammation. 2.  Chronic appearing bilateral maxillary sinus disease.   Original Report Authenticated By: Irish Lack, M.D.     MDM Number of Diagnoses or Management Options   MDM Reviewed: previous chart, nursing note and vitals Interpretation: labs and CT scan Consults: ent.  Marked improved post ID, clindamycin and pain meds prescribed follow up with Dr. Jearld Fenton in 1 week.  Patient verbalizes understanding and agrees to follow up  Gaelle Adriance Smitty Cords, MD 07/26/12 [phone]

## 2012-07-26 NOTE — ED Notes (Signed)
Pt discharged.Vital signs stable and GCS 15 

## 2012-09-15 ENCOUNTER — Emergency Department (HOSPITAL_COMMUNITY)
Admission: EM | Admit: 2012-09-15 | Discharge: 2012-09-15 | Disposition: A | Discharge status: Home / Self Care | Payer: Medicaid Other | Source: Home / Self Care | Attending: Emergency Medicine | Admitting: Emergency Medicine

## 2012-09-15 ENCOUNTER — Encounter (HOSPITAL_COMMUNITY): Payer: Self-pay | Admitting: Emergency Medicine

## 2012-09-15 DIAGNOSIS — M5430 Sciatica, unspecified side: Secondary | ICD-10-CM

## 2012-09-15 DIAGNOSIS — M5432 Sciatica, left side: Secondary | ICD-10-CM

## 2012-09-15 MED ORDER — HYDROCODONE-ACETAMINOPHEN 5-325 MG PO TABS: ORAL_TABLET | ORAL | Status: DC

## 2012-09-15 MED ORDER — METHOCARBAMOL 500 MG PO TABS: 500.0000 mg | ORAL_TABLET | Freq: Three times a day (TID) | ORAL | Status: DC

## 2012-09-15 MED ORDER — HYDROCODONE-ACETAMINOPHEN 5-325 MG PO TABS
ORAL_TABLET | ORAL | Status: AC
  Filled 2012-09-15: qty 1

## 2012-09-15 MED ORDER — LORAZEPAM 1 MG PO TABS: 1.0000 mg | ORAL_TABLET | Freq: Three times a day (TID) | ORAL | Status: DC | PRN

## 2012-09-15 MED ORDER — IBUPROFEN 800 MG PO TABS
ORAL_TABLET | ORAL | Status: AC
  Filled 2012-09-15: qty 1

## 2012-09-15 MED ORDER — IBUPROFEN 800 MG PO TABS
800.0000 mg | ORAL_TABLET | Freq: Once | ORAL | Status: AC
  Administered 2012-09-15: 800 mg via ORAL

## 2012-09-15 MED ORDER — HYDROCODONE-ACETAMINOPHEN 5-325 MG PO TABS
1.0000 | ORAL_TABLET | Freq: Once | ORAL | Status: AC
  Administered 2012-09-15: 1 via ORAL

## 2012-09-15 MED ORDER — DICLOFENAC SODIUM 75 MG PO TBEC: 75.0000 mg | DELAYED_RELEASE_TABLET | Freq: Two times a day (BID) | ORAL | Status: DC

## 2012-09-15 NOTE — ED Notes (Signed)
Patient c/o pain in left lower back, buttocks and left leg. Patient reports this pain for one week.  No known injury.  Patient reports planning to see pcp yesterday, missed appt.  Patient's second issue is stress/anxiety.  Missed appt yesterday because she found her father deceased.  History of anxiety.

## 2012-09-15 NOTE — ED Provider Notes (Signed)
Chief Complaint:   Chief Complaint  Patient presents with  . Back Pain    History of Present Illness:   Marissa Smith is a 27 year old female who has had a one-week history of exacerbation of chronic back pain problem. This and going on since 2005 when she was injured at work. The pain radiates down her left leg into her foot. Is worse with sitting and better when she stretches. Is rated an 8/10 in intensity. The toes on both feet feel numb and her left leg feels weak. The pain seemed to get worse today when she found out that her father died suddenly in his sleep last night. In addition to that she's had symptoms of anxiety including chest pain and pressure, tremulousness, dizziness, and tachycardia. She has had anxiety and bipolar disorder in the past. She denies any suicidal or homicidal ideation. She's had no bladder or bowel dysfunction. She has been pain management in the past for this, but is not seeing anyone right now.  Review of Systems:  Other than noted above, the patient denies any of the following symptoms: Systemic:  No fever, chills, severe fatigue, or unexplained weight loss. GI:  No abdominal pain, nausea, vomiting, diarrhea, constipation, incontinence of bowel, or blood in stool. GU:  No dysuria, frequency, urgency, or hematuria. No incontinence of urine or difficulty urinating.  M-S:  No neck pain, joint pain, arthritis, or myalgias. Neuro:  No paresthesias, saddle anesthesia, muscular weakness, or progressive neurological deficit.  PMFSH:  Past medical history, family history, social history, meds, and allergies were reviewed. Specifically, there is no history of cancer, major trauma, osteoporosis, immunosuppression, or HIV infection.   Physical Exam:   Vital signs:  BP 155/91  Pulse 71  Temp(Src) 98.5 F (36.9 C) (Oral)  Resp 18  SpO2 100% General:  Alert, oriented, in no distress. Abdomen:  Soft, non-tender.  No organomegaly or mass.  No pulsatile midline abdominal  mass or bruit. Back:  There is pain to palpation in the lower lumbar spine and limited range of motion with pain. Straight leg raising was negative. Neuro:  Normal muscle strength, sensations and DTRs. Extremities: Pedal pulses were full, there was no edema. Skin:  Clear, warm and dry.  No rash.  Course in Urgent Care Center:   She was given ibuprofen 800 mg and Norco 5/325 for pain.  Assessment:  The encounter diagnosis was Sciatica, left.  Suggested she followup with an orthopedist, Dr. Marcene Corning and given diclofenac, hydrocodone, and Robaxin. For her anxiety she was given Ativan 1 mg 3 times a day as needed.  Plan:   1.  The following meds were prescribed:   Discharge Medication List as of 09/15/2012  2:23 PM    START taking these medications   Details  diclofenac (VOLTAREN) 75 MG EC tablet Take 1 tablet (75 mg total) by mouth 2 (two) times daily., Starting 09/15/2012, Until Discontinued, Normal    HYDROcodone-acetaminophen (NORCO/VICODIN) 5-325 MG per tablet 1 to 2 tabs every 4 to 6 hours as needed for pain., Print    LORazepam (ATIVAN) 1 MG tablet Take 1 tablet (1 mg total) by mouth 3 (three) times daily as needed for anxiety., Starting 09/15/2012, Until Discontinued, Print    methocarbamol (ROBAXIN) 500 MG tablet Take 1 tablet (500 mg total) by mouth 3 (three) times daily., Starting 09/15/2012, Until Discontinued, Normal       2.  The patient was instructed in symptomatic care and handouts were given. 3.  The  patient was told to return if becoming worse in any way, if no better in 2 weeks, and given some red flag symptoms including worsening pain or neurological symptoms that would indicate earlier return. 4.  The patient was encouraged to try to be as active as possible and given some exercises to do followed by moist heat. 5.  Follow up with Dr. Jerl Santos for her chronic lower back pain.    Reuben Likes, MD 09/15/12 [phone]

## 2012-10-31 ENCOUNTER — Ambulatory Visit (INDEPENDENT_AMBULATORY_CARE_PROVIDER_SITE_OTHER): Payer: Medicaid Other | Admitting: Obstetrics

## 2012-10-31 VITALS — BP 120/81 | HR 69 | Temp 98.4°F | Wt 184.0 lb

## 2012-10-31 DIAGNOSIS — N939 Abnormal uterine and vaginal bleeding, unspecified: Secondary | ICD-10-CM | POA: Insufficient documentation

## 2012-10-31 DIAGNOSIS — Z30431 Encounter for routine checking of intrauterine contraceptive device: Secondary | ICD-10-CM

## 2012-10-31 DIAGNOSIS — N926 Irregular menstruation, unspecified: Secondary | ICD-10-CM

## 2012-10-31 DIAGNOSIS — N946 Dysmenorrhea, unspecified: Secondary | ICD-10-CM | POA: Insufficient documentation

## 2012-10-31 HISTORY — DX: Abnormal uterine and vaginal bleeding, unspecified: N93.9

## 2012-10-31 MED ORDER — IBUPROFEN 800 MG PO TABS: 800.0000 mg | ORAL_TABLET | Freq: Three times a day (TID) | ORAL | Status: DC | PRN

## 2012-10-31 NOTE — Progress Notes (Signed)
Subjective:     Marissa Smith is a 27 y.o. female here for a routine exam.  Current complaints: IUD check. Pt states she is having lower abdomen pain and cramping.  Pt states she currently has a Mirena IUD. Pt has not had a period while on the IUD and states she is currently having now. Pt states is a light flow. Personal health questionnaire reviewed: yes.   Gynecologic History No LMP recorded. Patient is not currently having periods (Reason: IUD). Contraception: IUD }  Obstetric History OB History   Grav Para Term Preterm Abortions TAB SAB Ect Mult Living                   The following portions of the patient's history were reviewed and updated as appropriate: allergies, current medications, past family history, past medical history, past social history, past surgical history and problem list.  Review of Systems Pertinent items are noted in HPI.    Objective:    General appearance: alert, no distress and moderately obese Abdomen: normal findings: soft, non-tender Pelvic: cervix normal in appearance, external genitalia normal, no adnexal masses or tenderness, no cervical motion tenderness, rectovaginal septum normal, uterus normal size, shape, and consistency, vagina normal without discharge and Menstrual blood in vault.    Assessment:    AUB  Dysmenorrhea  IUD surveillance   Plan:    Contraception: IUD. Follow up in: 2 weeks. Ultrasound ordered.

## 2012-11-01 ENCOUNTER — Other Ambulatory Visit: Payer: Self-pay | Admitting: Obstetrics

## 2012-11-01 DIAGNOSIS — Z30431 Encounter for routine checking of intrauterine contraceptive device: Secondary | ICD-10-CM

## 2012-11-01 LAB — GC/CHLAMYDIA PROBE AMP
CT Probe RNA: NEGATIVE
GC Probe RNA: NEGATIVE

## 2012-11-04 ENCOUNTER — Encounter: Payer: Self-pay | Admitting: Obstetrics

## 2012-11-08 ENCOUNTER — Other Ambulatory Visit: Payer: Medicaid Other

## 2012-11-14 ENCOUNTER — Ambulatory Visit: Payer: Self-pay | Admitting: Obstetrics

## 2012-11-18 ENCOUNTER — Emergency Department (HOSPITAL_COMMUNITY)
Admission: EM | Admit: 2012-11-18 | Discharge: 2012-11-18 | Disposition: A | Discharge status: Home / Self Care | Payer: Medicaid Other | Attending: Emergency Medicine | Admitting: Emergency Medicine

## 2012-11-18 ENCOUNTER — Encounter (HOSPITAL_COMMUNITY): Payer: Self-pay | Admitting: Emergency Medicine

## 2012-11-18 ENCOUNTER — Emergency Department (HOSPITAL_COMMUNITY): Payer: Medicaid Other

## 2012-11-18 DIAGNOSIS — J039 Acute tonsillitis, unspecified: Secondary | ICD-10-CM

## 2012-11-18 DIAGNOSIS — E041 Nontoxic single thyroid nodule: Secondary | ICD-10-CM

## 2012-11-18 DIAGNOSIS — F319 Bipolar disorder, unspecified: Secondary | ICD-10-CM | POA: Insufficient documentation

## 2012-11-18 DIAGNOSIS — G8929 Other chronic pain: Secondary | ICD-10-CM | POA: Insufficient documentation

## 2012-11-18 DIAGNOSIS — F17200 Nicotine dependence, unspecified, uncomplicated: Secondary | ICD-10-CM | POA: Insufficient documentation

## 2012-11-18 DIAGNOSIS — R1310 Dysphagia, unspecified: Secondary | ICD-10-CM | POA: Insufficient documentation

## 2012-11-18 MED ORDER — ONDANSETRON HCL 4 MG/2ML IJ SOLN
4.0000 mg | Freq: Once | INTRAMUSCULAR | Status: AC
  Administered 2012-11-18: 4 mg via INTRAVENOUS
  Filled 2012-11-18: qty 2

## 2012-11-18 MED ORDER — AMOXICILLIN 500 MG PO CAPS: 500.0000 mg | ORAL_CAPSULE | Freq: Three times a day (TID) | ORAL | Status: DC

## 2012-11-18 MED ORDER — ONDANSETRON HCL 8 MG PO TABS: 8.0000 mg | ORAL_TABLET | Freq: Three times a day (TID) | ORAL | Status: DC | PRN

## 2012-11-18 MED ORDER — IOHEXOL 300 MG/ML  SOLN
75.0000 mL | Freq: Once | INTRAMUSCULAR | Status: AC | PRN
  Administered 2012-11-18: 75 mL via INTRAVENOUS

## 2012-11-18 MED ORDER — HYDROCODONE-ACETAMINOPHEN 5-325 MG PO TABS: 1.0000 | ORAL_TABLET | ORAL | Status: DC | PRN

## 2012-11-18 MED ORDER — MORPHINE SULFATE 4 MG/ML IJ SOLN
4.0000 mg | Freq: Once | INTRAMUSCULAR | Status: AC
  Administered 2012-11-18: 4 mg via INTRAVENOUS
  Filled 2012-11-18: qty 1

## 2012-11-18 NOTE — ED Notes (Signed)
Pt has ride home.

## 2012-11-18 NOTE — ED Notes (Signed)
Pt transported to CT ?

## 2012-11-18 NOTE — ED Provider Notes (Signed)
CSN: [id number]     Arrival date & time 11/18/12  0014 History     None    Chief Complaint  Patient presents with  . Jaw Pain  . Dental Pain   (Consider location/radiation/quality/duration/timing/severity/associated sxs/prior Treatment) HPI History provided by pt.   Pt developed pain at right mastoid 2 days ago.  Pain has since migrated to right mandible, is severe, and is aggravated by opening mouth and biting down.  Denies fever, skin changes, ear pain, dental pain, sore throat. She does report mild dysphagia.   Denies trauma.  Had I&D of right peritonsillar abscess in 07/2012.  Past Medical History  Diagnosis Date  . Back pain, chronic   . Depressed bipolar I disorder    History reviewed. No pertinent past surgical history. No family history on file. History  Substance Use Topics  . Smoking status: Current Every Day Smoker -- 0.20 packs/day  . Smokeless tobacco: Not on file  . Alcohol Use: 0.6 oz/week    1 Cans of beer per week   OB History   Grav Para Term Preterm Abortions TAB SAB Ect Mult Living                 Review of Systems  All other systems reviewed and are negative.    Allergies  Darvocet and Miconazole nitrate  Home Medications   Current Outpatient Rx  Name  Route  Sig  Dispense  Refill  . ibuprofen (ADVIL,MOTRIN) 200 MG tablet   Oral   Take 400 mg by mouth every 6 (six) hours as needed for pain.         Marland Kitchen zolpidem (AMBIEN) 10 MG tablet   Oral   Take 10 mg by mouth at bedtime as needed. For sleep          BP 129/67  Pulse 65  Temp(Src) 98.2 F (36.8 C) (Oral)  Resp 18  SpO2 100% Physical Exam  Nursing note and vitals reviewed. Constitutional: She is oriented to person, place, and time. She appears well-developed and well-nourished. No distress.  HENT:  Head: Normocephalic and atraumatic.  No edema, erythema or skin changes.  No tenderness of right mastoid.  Nml right external auditory canal and TM.  Mild tenderness right mandible, not  including TMJ.  No dental tenderness or visible caries.  Right tonsil enlarged but w/out erythema or exudate.  Nml posterior pharynx.  Uvula mid-line and no trismus.  Full ROM of jaw.   Eyes:  Normal appearance  Neck: Normal range of motion.  Pulmonary/Chest: Effort normal.  Musculoskeletal: Normal range of motion.  Neurological: She is alert and oriented to person, place, and time.  Psychiatric: She has a normal mood and affect. Her behavior is normal.    ED Course   Procedures (including critical care time)  Labs Reviewed - No data to display Ct Soft Tissue Neck W Contrast  11/18/2012   *RADIOLOGY REPORT*  Clinical Data: Back pain, question of right peritonisillar abscess  CT NECK WITH CONTRAST  Technique:  Multidetector CT imaging of the neck was performed with intravenous contrast.  Contrast: 75mL OMNIPAQUE IOHEXOL 300 MG/ML  SOLN  Comparison:  prior CT from 07/26/2012.  Findings: Visualized portions of the brain are unremarkable. Left the visualized orbits are normal.  There is partial opacification of the right maxillary sinus. Minimal circumferential opacity is present within the left maxillary sinus as well.  The nasal septum is bowed to the right.  The salivary glands including the parotid glands  and submandibular glands are normal.  The oral cavity is normal.  There is minimal asymmetric enlargement of the right palatine tonsil as compared to the left.  No loculated fluid collection to suggest peri tonsillar or tonsillar abscess is identified.  The right parapharyngeal fat is preserved.  Bilateral small sub centimeter calcified tonsiliths are noted.  The oropharynx and nasopharynx is normal.  No significant dental caries are appreciated.  The larynx and hypopharynx are normal. The lingual tonsils are normal.  The valleculae is clear.  The epiglottis is normal.  No pathologically enlarged lymph nodes are identified within the neck.  A 8 mm these a hypodense nodule is present within the right  lobe of thyroid (series 2, image 64).  Thyroid is otherwise unremarkable.  Visualized portions of the superior mediastinum are within normal limits.  The visualized lungs are clear.  Intravascular enhancement is seen within the vasculature of the neck.  No acute osseous abnormality identified.  IMPRESSION: 1. Minimal asymmetric enlargement of the right tonsil as compared to the left.  No loculated fluid collection to suggest tonsillar or peri tonsillar abscess formation identified.  Previously identified right peri tonsillar fluid collection seen on CT from 07/26/2012 has resolved. 2.  Indeterminate heterogeneously hypodense 8 mm right thyroid nodule.  Correlation with dedicated thyroid ultrasound could be performed on a non emergent basis as clinically indicated.   Original Report Authenticated By: Rise Mu, M.D.   1. Tonsillitis   2. Thyroid nodule     MDM  27yo F presents w/ non-traumatic right jaw pain.  No objective findings on exam w/ exception of enlarged right tonsil.  Had a peritonsillar abscess in 07/2012.  CT neck ordered today to r/o same and there is no obvious infection.  Discussed w/ Dr. Dierdre Highman and he recommends treating for tonsillitis and referring to ENT.  Jaw pain Fanguy be referred.  Patient has been made aware of thyroid nodule and importance of f/u discussed.   Prescribed amoxicillin and 12 vicodin.  She will return for worsening pain or dysphagia.  Otilio Miu, PA-C 11/18/12 [phone]

## 2012-11-18 NOTE — ED Notes (Signed)
PT. REPORTS RIGHT JAW / RIGHT LOWER MOLAR PAIN ONSET YESTERDAY UNRELIEVED BY OTC IBUPROFEN .

## 2012-11-19 NOTE — ED Provider Notes (Signed)
Medical screening examination/treatment/procedure(s) were performed by non-physician practitioner and as supervising physician I was immediately available for consultation/collaboration.  Sunnie Nielsen, MD 11/19/12 [phone]

## 2013-02-28 ENCOUNTER — Emergency Department (HOSPITAL_COMMUNITY)
Admission: EM | Admit: 2013-02-28 | Discharge: 2013-02-28 | Disposition: A | Discharge status: Home / Self Care | Payer: Medicaid Other | Attending: Emergency Medicine | Admitting: Emergency Medicine

## 2013-02-28 ENCOUNTER — Encounter (HOSPITAL_COMMUNITY): Payer: Self-pay | Admitting: Emergency Medicine

## 2013-02-28 DIAGNOSIS — L299 Pruritus, unspecified: Secondary | ICD-10-CM | POA: Insufficient documentation

## 2013-02-28 DIAGNOSIS — R21 Rash and other nonspecific skin eruption: Secondary | ICD-10-CM | POA: Insufficient documentation

## 2013-02-28 DIAGNOSIS — T7840XA Allergy, unspecified, initial encounter: Secondary | ICD-10-CM

## 2013-02-28 DIAGNOSIS — M549 Dorsalgia, unspecified: Secondary | ICD-10-CM | POA: Insufficient documentation

## 2013-02-28 DIAGNOSIS — G8929 Other chronic pain: Secondary | ICD-10-CM | POA: Insufficient documentation

## 2013-02-28 DIAGNOSIS — Z79899 Other long term (current) drug therapy: Secondary | ICD-10-CM | POA: Insufficient documentation

## 2013-02-28 DIAGNOSIS — F17200 Nicotine dependence, unspecified, uncomplicated: Secondary | ICD-10-CM | POA: Insufficient documentation

## 2013-02-28 DIAGNOSIS — Z885 Allergy status to narcotic agent status: Secondary | ICD-10-CM | POA: Insufficient documentation

## 2013-02-28 DIAGNOSIS — F3130 Bipolar disorder, current episode depressed, mild or moderate severity, unspecified: Secondary | ICD-10-CM | POA: Insufficient documentation

## 2013-02-28 DIAGNOSIS — Z888 Allergy status to other drugs, medicaments and biological substances status: Secondary | ICD-10-CM | POA: Insufficient documentation

## 2013-02-28 MED ORDER — KETOROLAC TROMETHAMINE 0.5 % OP SOLN
1.0000 [drp] | OPHTHALMIC | Status: AC
  Administered 2013-02-28: 1 [drp] via OPHTHALMIC
  Filled 2013-02-28: qty 3

## 2013-02-28 MED ORDER — PREDNISONE 10 MG PO TABS: 20.0000 mg | ORAL_TABLET | Freq: Every day | ORAL | Status: DC

## 2013-02-28 MED ORDER — EPINEPHRINE HCL 0.1 MG/ML IJ SOSY
0.3000 mg | PREFILLED_SYRINGE | Freq: Once | INTRAMUSCULAR | Status: DC
  Filled 2013-02-28: qty 10

## 2013-02-28 MED ORDER — FAMOTIDINE 20 MG PO TABS
20.0000 mg | ORAL_TABLET | Freq: Once | ORAL | Status: AC
  Administered 2013-02-28: 20 mg via ORAL
  Filled 2013-02-28: qty 1

## 2013-02-28 MED ORDER — FAMOTIDINE 20 MG PO TABS: 20.0000 mg | ORAL_TABLET | Freq: Once | ORAL | Status: DC

## 2013-02-28 MED ORDER — EPINEPHRINE HCL 1 MG/ML IJ SOLN
0.3000 mg | Freq: Once | INTRAMUSCULAR | Status: AC
  Administered 2013-02-28: 0.3 mg via SUBCUTANEOUS
  Filled 2013-02-28: qty 1

## 2013-02-28 MED ORDER — PREDNISONE 20 MG PO TABS
60.0000 mg | ORAL_TABLET | Freq: Once | ORAL | Status: AC
  Administered 2013-02-28: 60 mg via ORAL
  Filled 2013-02-28: qty 3

## 2013-02-28 MED ORDER — EPINEPHRINE HCL 0.1 MG/ML IJ SOSY: 0.3000 mg | PREFILLED_SYRINGE | Freq: Once | INTRAMUSCULAR | Status: DC

## 2013-02-28 NOTE — ED Provider Notes (Signed)
CSN: [id number]     Arrival date & time 02/28/13  0004 History   First MD Initiated Contact with Patient 02/28/13 0006     Chief Complaint  Patient presents with  . Rash   (Consider location/radiation/quality/duration/timing/severity/associated sxs/prior Treatment) HPI Comments: Woke this am with a rash on upper R eye lid that has spread to entire periorbital area, cheek , neck and upper chest  + itch  Denies SOB, Tachycardia abdominal pain   Patient is a 27 y.o. female presenting with rash. The history is provided by the patient.  Rash Location:  Face Quality: burning, dryness, itchiness, redness and swelling   Severity:  Moderate Onset quality:  Gradual Duration:  12 hours Timing:  Constant Context comment:  Unknown Relieved by:  Nothing Ineffective treatments:  Antihistamines Associated symptoms: no abdominal pain, no fever, no nausea, no shortness of breath, no sore throat and not wheezing     Past Medical History  Diagnosis Date  . Back pain, chronic   . Depressed bipolar I disorder    Past Surgical History  Procedure Laterality Date  . Cholecystectomy     No family history on file. History  Substance Use Topics  . Smoking status: Current Every Day Smoker -- 0.20 packs/day  . Smokeless tobacco: Not on file  . Alcohol Use: 0.6 oz/week    1 Cans of beer per week   OB History   Grav Para Term Preterm Abortions TAB SAB Ect Mult Living                 Review of Systems  Constitutional: Negative for fever and chills.  HENT: Negative for sore throat and trouble swallowing.   Eyes: Negative for pain, redness, itching and visual disturbance.  Respiratory: Negative for shortness of breath and wheezing.   Gastrointestinal: Negative for nausea and abdominal pain.  Skin: Positive for rash.  All other systems reviewed and are negative.    Allergies  Darvocet and Miconazole nitrate  Home Medications   Current Outpatient Rx  Name  Route  Sig  Dispense  Refill  .  diphenhydrAMINE (BENADRYL) 25 mg capsule   Oral   Take 50 mg by mouth every 6 (six) hours as needed for itching or allergies.         Marland Kitchen ibuprofen (ADVIL,MOTRIN) 200 MG tablet   Oral   Take 400 mg by mouth every 6 (six) hours as needed for pain.         Marland Kitchen zolpidem (AMBIEN) 10 MG tablet   Oral   Take 10 mg by mouth at bedtime as needed. For sleep         . famotidine (PEPCID) 20 MG tablet   Oral   Take 1 tablet (20 mg total) by mouth once.   10 tablet   0   . predniSONE (DELTASONE) 10 MG tablet   Oral   Take 2 tablets (20 mg total) by mouth daily.   14 tablet   0    BP 138/70  Temp(Src) 98.3 F (36.8 C) (Oral)  Resp 18  SpO2 100% Physical Exam  Nursing note and vitals reviewed. Constitutional: She appears well-developed and well-nourished.  HENT:  Head: Normocephalic.    Eyes: Pupils are equal, round, and reactive to light.  Neck: Normal range of motion.  Cardiovascular: Normal rate and regular rhythm.   Pulmonary/Chest: Effort normal.  Abdominal: Soft.  Musculoskeletal: Normal range of motion. She exhibits no edema and no tenderness.  Neurological: She is alert.  Skin: Skin is warm. Rash noted.    ED Course  Procedures (including critical care time) Labs Review Labs Reviewed - No data to display Imaging Review No results found.  EKG Interpretation   None       MDM   1. Allergic, initial encounter    After SQ Epi, PO Prednisone and Pepcid redness and itch decreasing  DC home with Rx for Pepcid and Prednisone     Arman Filter, NP 02/28/13 1610

## 2013-02-28 NOTE — ED Notes (Signed)
Pt. woke up this morning with itchy rashes at right side of face and chest . Airway is patent / respirations unlabored .

## 2013-02-28 NOTE — Discharge Instructions (Signed)
Allergies  Allergies Go[place]ke happen from anything your body is sensitive to. This [place] be food, medicines, pollens, chemicals, and many other things. Food allergies can be severe and deadly.  HOME CARE  If you do not know what causes a reaction, keep a diary. Write down the foods you ate and the symptoms that followed. Avoid foods that cause reactions.  If you have red raised spots (hives) or a rash:  Take medicine as told by your doctor.  Use medicines for red raised spots and itching as needed.  Apply cold cloths (compresses) to the skin. Take a cool bath. Avoid hot baths or showers.  If you are severely allergic:  It is often necessary to go to the hospital after you have treated your reaction.  Wear your medical alert jewelry.  You and your family must learn how to give a allergy shot or use an allergy kit (anaphylaxis kit).  Always carry your allergy kit or shot with you. Use this medicine as told by your doctor if a severe reaction is occurring. GET HELP RIGHT AWAY IF:  You have trouble breathing or are making high-pitched whistling sounds (wheezing).  You have a tight feeling in your chest or throat.  You have a puffy (swollen) mouth.  You have red raised spots, puffiness (swelling), or itching all over your body.  You have had a severe reaction that was helped by your allergy kit or shot. The reaction can return once the medicine has worn off.  You think you are having a food allergy. Symptoms most often happen within 30 minutes of eating a food.  Your symptoms have not gone away within 2 days or are getting worse.  You have new symptoms.  You want to retest yourself with a food or drink you think causes an allergic reaction. Only do this under the care of a doctor. MAKE SURE YOU:   Understand these instructions.  Will watch your condition.  Will get help right away if you are not doing well or get worse. Document Released: 07/25/2012 Document Reviewed:  07/25/2012 [facility] Patient Information 2014 [place], [place]. Please take the medication as directed until completed

## 2013-02-28 NOTE — ED Provider Notes (Signed)
Medical screening examination/treatment/procedure(s) were conducted as a shared visit with non-physician practitioner(s) and myself.  I personally evaluated the patient during the encounter. Patient is 27 year old female presents to the emergency department with complaints of facial rash for the past 12 hours. It is now spreading to her neck and chest. It is very itchy and annoying to her. There is no shortness of breath or difficulty breathing. She denies difficulty swallowing. She denies any new exposures or contacts.  On exam vitals are stable the patient is afebrile. Heart is regular rate and rhythm and lungs are clear. There is an urticarial rash noted around the right eye, anterior aspect of the neck, and between the breasts.  She was given prednisone and Pepcid followed by a subcutaneous dose of epinephrine. Her symptoms improved significantly she will be discharged with the airways and antihistamines.     Geoffery Lyons, MD 02/28/13 [phone]

## 2013-03-16 ENCOUNTER — Other Ambulatory Visit: Payer: Self-pay | Admitting: Internal Medicine

## 2013-03-16 DIAGNOSIS — E041 Nontoxic single thyroid nodule: Secondary | ICD-10-CM

## 2013-03-22 ENCOUNTER — Other Ambulatory Visit: Payer: Medicaid Other

## 2013-07-11 ENCOUNTER — Emergency Department (HOSPITAL_COMMUNITY)
Admission: EM | Admit: 2013-07-11 | Discharge: 2013-07-11 | Disposition: A | Discharge status: Home / Self Care | Payer: Medicaid Other | Attending: Emergency Medicine | Admitting: Emergency Medicine

## 2013-07-11 ENCOUNTER — Encounter (HOSPITAL_COMMUNITY): Payer: Self-pay | Admitting: Emergency Medicine

## 2013-07-11 ENCOUNTER — Emergency Department (HOSPITAL_COMMUNITY): Payer: Medicaid Other

## 2013-07-11 DIAGNOSIS — Z8659 Personal history of other mental and behavioral disorders: Secondary | ICD-10-CM | POA: Insufficient documentation

## 2013-07-11 DIAGNOSIS — G8929 Other chronic pain: Secondary | ICD-10-CM | POA: Insufficient documentation

## 2013-07-11 DIAGNOSIS — S99919A Unspecified injury of unspecified ankle, initial encounter: Principal | ICD-10-CM

## 2013-07-11 DIAGNOSIS — Y9389 Activity, other specified: Secondary | ICD-10-CM | POA: Insufficient documentation

## 2013-07-11 DIAGNOSIS — W208XXA Other cause of strike by thrown, projected or falling object, initial encounter: Secondary | ICD-10-CM | POA: Insufficient documentation

## 2013-07-11 DIAGNOSIS — S8990XA Unspecified injury of unspecified lower leg, initial encounter: Secondary | ICD-10-CM | POA: Insufficient documentation

## 2013-07-11 DIAGNOSIS — M25569 Pain in unspecified knee: Secondary | ICD-10-CM

## 2013-07-11 DIAGNOSIS — S99929A Unspecified injury of unspecified foot, initial encounter: Principal | ICD-10-CM

## 2013-07-11 DIAGNOSIS — F17200 Nicotine dependence, unspecified, uncomplicated: Secondary | ICD-10-CM | POA: Insufficient documentation

## 2013-07-11 DIAGNOSIS — Y929 Unspecified place or not applicable: Secondary | ICD-10-CM | POA: Insufficient documentation

## 2013-07-11 DIAGNOSIS — IMO0002 Reserved for concepts with insufficient information to code with codable children: Secondary | ICD-10-CM | POA: Insufficient documentation

## 2013-07-11 MED ORDER — HYDROCODONE-ACETAMINOPHEN 5-325 MG PO TABS: 1.0000 | ORAL_TABLET | ORAL | Status: DC | PRN

## 2013-07-11 NOTE — Discharge Instructions (Signed)
Arthralgia  Your caregiver has diagnosed you as suffering from an arthralgia. Arthralgia means there is pain in a joint. This can come from many reasons including:  · Bruising the joint which causes soreness (inflammation) in the joint.  · Wear and tear on the joints which occur as we grow older (osteoarthritis).  · Overusing the joint.  · Various forms of arthritis.  · Infections of the joint.  Regardless of the cause of pain in your joint, most of these different pains respond to anti-inflammatory drugs and rest. The exception to this is when a joint is infected, and these cases are treated with antibiotics, if it is a bacterial infection.  HOME CARE INSTRUCTIONS   · Rest the injured area for as long as directed by your caregiver. Then slowly start using the joint as directed by your caregiver and as the pain allows. Crutches as directed Pass[place] be useful if the ankles, knees or hips are involved. If the knee was splinted or casted, continue use and care as directed. If an stretchy or elastic wrapping bandage has been applied today, it should be removed and re-applied every 3 to 4 hours. It should not be applied tightly, but firmly enough to keep swelling down. Watch toes and feet for swelling, bluish discoloration, coldness, numbness or excessive pain. If any of these problems (symptoms) occur, remove the ace bandage and re-apply more loosely. If these symptoms persist, contact your caregiver or return to this location.  · For the first 24 hours, keep the injured extremity elevated on pillows while lying down.  · Apply ice for 15-20 minutes to the sore joint every couple hours while awake for the first half day. Then 03-04 times per day for the first 48 hours. Put the ice in a plastic bag and place a towel between the bag of ice and your skin.  · Wear any splinting, casting, elastic bandage applications, or slings as instructed.  · Only take over-the-counter or prescription medicines for pain, discomfort, or fever as  directed by your caregiver. Do not use aspirin immediately after the injury unless instructed by your physician. Aspirin can cause increased bleeding and bruising of the tissues.  · If you were given crutches, continue to use them as instructed and do not resume weight bearing on the sore joint until instructed.  Persistent pain and inability to use the sore joint as directed for more than 2 to 3 days are warning signs indicating that you should see a caregiver for a follow-up visit as soon as possible. Initially, a hairline fracture (break in bone) Bloor not be evident on X-rays. Persistent pain and swelling indicate that further evaluation, non-weight bearing or use of the joint (use of crutches or slings as instructed), or further X-rays are indicated. X-rays G[place] sometimes not show a small fracture until a week or 10 days later. Make a follow-up appointment with your own caregiver or one to whom we have referred you. A radiologist (specialist in reading X-rays) Boxer read your X-rays. Make sure you know how you are to obtain your X-ray results. Do not assume everything is normal if you do not hear from us.  SEEK MEDICAL CARE IF:  Bruising, swelling, or pain increases.  SEEK IMMEDIATE MEDICAL CARE IF:   · Your fingers or toes are numb or blue.  · The pain is not responding to medications and continues to stay the same or get worse.  · The pain in your joint becomes severe.  · You   develop a fever over 102° F (38.9° C).  · It becomes impossible to move or use the joint.  MAKE SURE YOU:   · Understand these instructions.  · Will watch your condition.  · Will get help right away if you are not doing well or get worse.  Document Released: 03/30/2005 Document Revised: 06/22/2011 Document Reviewed: 11/16/2007  [employer]® Patient Information ©2014 [employer].

## 2013-07-11 NOTE — ED Notes (Signed)
Pt reports dropping a 40 pack of beer on her left foot x2 days ago. Pt reports foot bruised and had some pain but states that a day later began having left knee pain. Pt denies any injury to the knee but states she [place] have twisted it while limping to stay off her foot. Pt reports woke up this morning and unable to bend or extend knee without pain. Pt ambulatory from waiting room. NAD.

## 2013-07-11 NOTE — ED Provider Notes (Signed)
CSN: [id number]     Arrival date & time 07/11/13  1420 History  This chart was scribed for Teressa Lower, NP working with Rolland Porter, MD by Quintella Reichert, ED Scribe. This patient was seen in room TR10C/TR10C and the patient's care was started at 2:38 PM.  Chief Complaint  Patient presents with  . Knee Pain    The history is provided by the patient. No language interpreter was used.    HPI Comments: Canna Nickelson Gunkel is a 28 y.o. female who presents to the Emergency Department complaining of left knee pain that began 2 days ago.  Pt reports that 3 days ago she dropped something on her left foot and was limping throughout that day.  The next day she began to experience sharp pain in the left knee whenever she extended the knee.  She denies any specific injury to the knee but states she [place] have twisted it while limping to stay off her foot.  Today she has had constant throbbing pain to that knee.  She has attempted to treat pain with ibuprofen, without relief.  Pt denies prior h/o injuries or issues with that knee.   Past Medical History  Diagnosis Date  . Back pain, chronic   . Depressed bipolar I disorder     Past Surgical History  Procedure Laterality Date  . Cholecystectomy      History reviewed. No pertinent family history.   History  Substance Use Topics  . Smoking status: Current Every Day Smoker -- 0.20 packs/day    Types: Cigarettes  . Smokeless tobacco: Not on file  . Alcohol Use: No    OB History   Grav Para Term Preterm Abortions TAB SAB Ect Mult Living                   Review of Systems  Musculoskeletal: Positive for arthralgias (left knee).  All other systems reviewed and are negative.      Allergies  Darvocet and Miconazole nitrate  Home Medications   Current Outpatient Rx  Name  Route  Sig  Dispense  Refill  . diphenhydrAMINE (BENADRYL) 25 mg capsule   Oral   Take 50 mg by mouth every 6 (six) hours as needed for itching or allergies.         . famotidine (PEPCID) 20 MG tablet   Oral   Take 1 tablet (20 mg total) by mouth once.   10 tablet   0   . ibuprofen (ADVIL,MOTRIN) 200 MG tablet   Oral   Take 400 mg by mouth every 6 (six) hours as needed for pain.         . predniSONE (DELTASONE) 10 MG tablet   Oral   Take 2 tablets (20 mg total) by mouth daily.   14 tablet   0   . zolpidem (AMBIEN) 10 MG tablet   Oral   Take 10 mg by mouth at bedtime as needed. For sleep          BP 115/56  Pulse 69  Temp(Src) 97.9 F (36.6 C) (Oral)  Resp 12  SpO2 100%  Physical Exam  Nursing note and vitals reviewed. Constitutional: She is oriented to person, place, and time. She appears well-developed and well-nourished. No distress.  HENT:  Head: Normocephalic and atraumatic.  Eyes: EOM are normal.  Neck: Neck supple. No tracheal deviation present.  Cardiovascular: Normal rate.   Pulmonary/Chest: Effort normal. No respiratory distress.  Musculoskeletal: Normal range of motion. She exhibits  tenderness.  Tenderness to lateral aspect of left knee:no swelling or deformity noted  Neurological: She is alert and oriented to person, place, and time.  Skin: Skin is warm and dry.  Psychiatric: She has a normal mood and affect. Her behavior is normal.    ED Course  Procedures (including critical care time)  DIAGNOSTIC STUDIES: Oxygen Saturation is 100% on room air, normal by my interpretation.    COORDINATION OF CARE: 2:42 PM-Discussed treatment plan which includes x-ray with pt at bedside and pt agreed to plan.     Labs Review Labs Reviewed - No data to display   Imaging Review Dg Knee Complete 4 Views Left  07/11/2013   CLINICAL DATA:  Trauma, left knee pain  EXAM: LEFT KNEE - COMPLETE 4+ VIEW  COMPARISON:  None.  FINDINGS: There is no evidence of fracture, dislocation, or joint effusion. There is no evidence of arthropathy or other focal bone abnormality. Soft tissues are unremarkable.  IMPRESSION: Negative.    Electronically Signed   By: Ruel Favorsrevor  Shick M.D.   On: 07/11/2013 15:12     EKG Interpretation None      MDM   Final diagnoses:  Knee pain    Pt placed in knee sleeve for comfort and given vicodin for pain. Pt given ortho follow up with DR. Lajoyce CornersDuda    I personally performed the services described in this documentation, which was scribed in my presence. The recorded information has been reviewed and is accurate.    Teressa LowerVrinda Gregroy Dombkowski, NP 07/11/13 1630

## 2013-07-13 ENCOUNTER — Ambulatory Visit: Payer: Medicaid Other | Admitting: Obstetrics

## 2013-07-14 NOTE — ED Provider Notes (Signed)
Medical screening examination/treatment/procedure(s) were performed by non-physician practitioner and as supervising physician I was immediately available for consultation/collaboration.   EKG Interpretation None        Terianna Peggs, MD 07/14/13 1602 

## 2013-07-24 ENCOUNTER — Ambulatory Visit (INDEPENDENT_AMBULATORY_CARE_PROVIDER_SITE_OTHER): Payer: Medicaid Other | Admitting: Obstetrics

## 2013-07-24 ENCOUNTER — Encounter: Payer: Self-pay | Admitting: Obstetrics

## 2013-07-24 VITALS — BP 113/75 | HR 76 | Temp 98.1°F | Wt 169.0 lb

## 2013-07-24 DIAGNOSIS — Z30431 Encounter for routine checking of intrauterine contraceptive device: Secondary | ICD-10-CM

## 2013-07-24 DIAGNOSIS — E041 Nontoxic single thyroid nodule: Secondary | ICD-10-CM

## 2013-07-24 DIAGNOSIS — Z3202 Encounter for pregnancy test, result negative: Secondary | ICD-10-CM

## 2013-07-24 DIAGNOSIS — Z01818 Encounter for other preprocedural examination: Secondary | ICD-10-CM

## 2013-07-24 DIAGNOSIS — Z30432 Encounter for removal of intrauterine contraceptive device: Secondary | ICD-10-CM

## 2013-07-24 DIAGNOSIS — Z113 Encounter for screening for infections with a predominantly sexual mode of transmission: Secondary | ICD-10-CM

## 2013-07-24 LAB — POCT URINE PREGNANCY: Preg Test, Ur: NEGATIVE

## 2013-07-24 NOTE — Progress Notes (Signed)
Subjective:     Marissa Smith is a 28 y.o. female here for a routine exam.  Current complaints: Pt states that she would like to have her IUD removed at today's visit.  Pt also need STD check.     The HPI was reviewed and explored in further detail by the provider. Gynecologic History No LMP recorded. Patient is not currently having periods (Reason: IUD). Contraception: IUD Last Pap: 2014. Results were: normal Last mammogram: n/a. Results were: n/a  Obstetric History OB History  No data available     The following portions of the patient's history were reviewed and updated as appropriate: allergies, current medications, past family history, past medical history, past social history, past surgical history and problem list.  Review of Systems A comprehensive review of systems was negative.    Objective:    General appearance: alert and no distress Abdomen: normal findings: soft, non-tender Pelvic: cervix normal in appearance, external genitalia normal, no adnexal masses or tenderness, no cervical motion tenderness, rectovaginal septum normal, uterus normal size, shape, and consistency, vagina normal without discharge and IUD string visualized and IUD grasped wit long dressing forceps and removed, intact      Assessment:    IUD Surveillance / Removal.   Plan:    Education reviewed: safe sex/STD prevention. Contraception: IUD. Follow up in: 1 week. Mirena IUD insertion next visit.

## 2013-07-25 LAB — HEPATITIS C ANTIBODY: HCV Ab: NEGATIVE

## 2013-07-25 LAB — WET PREP BY MOLECULAR PROBE
Candida species: NEGATIVE
Gardnerella vaginalis: POSITIVE — AB
Trichomonas vaginosis: NEGATIVE

## 2013-07-25 LAB — GC/CHLAMYDIA PROBE AMP
CT Probe RNA: POSITIVE — AB
GC Probe RNA: NEGATIVE

## 2013-07-25 LAB — HIV ANTIBODY (ROUTINE TESTING W REFLEX): HIV 1&2 Ab, 4th Generation: NONREACTIVE

## 2013-07-25 LAB — RPR

## 2013-07-25 LAB — HEPATITIS B SURFACE ANTIGEN: Hepatitis B Surface Ag: NEGATIVE

## 2013-07-28 ENCOUNTER — Other Ambulatory Visit: Payer: Self-pay | Admitting: *Deleted

## 2013-07-28 DIAGNOSIS — A749 Chlamydial infection, unspecified: Secondary | ICD-10-CM

## 2013-07-28 MED ORDER — AZITHROMYCIN 250 MG PO TABS: 1000.0000 mg | ORAL_TABLET | Freq: Once | ORAL | Status: DC

## 2013-07-28 MED ORDER — CEFIXIME 400 MG PO TABS: 400.0000 mg | ORAL_TABLET | Freq: Once | ORAL | Status: DC

## 2013-07-31 ENCOUNTER — Encounter: Payer: Self-pay | Admitting: Obstetrics & Gynecology

## 2013-08-01 ENCOUNTER — Other Ambulatory Visit: Payer: Self-pay | Admitting: *Deleted

## 2013-08-01 DIAGNOSIS — A749 Chlamydial infection, unspecified: Secondary | ICD-10-CM

## 2013-08-01 MED ORDER — CEFIXIME 500 MG/5ML PO SUSR: 400.0000 mg | Freq: Once | ORAL | Status: DC

## 2013-08-02 ENCOUNTER — Ambulatory Visit: Payer: Medicaid Other | Admitting: Obstetrics

## 2013-08-09 ENCOUNTER — Ambulatory Visit: Payer: Self-pay | Admitting: Obstetrics

## 2013-09-27 ENCOUNTER — Encounter (HOSPITAL_COMMUNITY): Payer: Self-pay | Admitting: Emergency Medicine

## 2013-09-27 ENCOUNTER — Emergency Department (HOSPITAL_COMMUNITY)
Admission: EM | Admit: 2013-09-27 | Discharge: 2013-09-27 | Disposition: A | Discharge status: Home / Self Care | Payer: Medicaid Other | Attending: Emergency Medicine | Admitting: Emergency Medicine

## 2013-09-27 ENCOUNTER — Emergency Department (HOSPITAL_COMMUNITY): Payer: Medicaid Other

## 2013-09-27 DIAGNOSIS — R209 Unspecified disturbances of skin sensation: Secondary | ICD-10-CM | POA: Insufficient documentation

## 2013-09-27 DIAGNOSIS — Z792 Long term (current) use of antibiotics: Secondary | ICD-10-CM | POA: Insufficient documentation

## 2013-09-27 DIAGNOSIS — G8929 Other chronic pain: Secondary | ICD-10-CM | POA: Insufficient documentation

## 2013-09-27 DIAGNOSIS — M7710 Lateral epicondylitis, unspecified elbow: Secondary | ICD-10-CM

## 2013-09-27 DIAGNOSIS — F17200 Nicotine dependence, unspecified, uncomplicated: Secondary | ICD-10-CM | POA: Insufficient documentation

## 2013-09-27 DIAGNOSIS — F3130 Bipolar disorder, current episode depressed, mild or moderate severity, unspecified: Secondary | ICD-10-CM | POA: Insufficient documentation

## 2013-09-27 DIAGNOSIS — M549 Dorsalgia, unspecified: Secondary | ICD-10-CM | POA: Insufficient documentation

## 2013-09-27 DIAGNOSIS — Z79899 Other long term (current) drug therapy: Secondary | ICD-10-CM | POA: Insufficient documentation

## 2013-09-27 MED ORDER — HYDROCODONE-ACETAMINOPHEN 5-325 MG PO TABS: 1.0000 | ORAL_TABLET | Freq: Four times a day (QID) | ORAL | Status: DC | PRN

## 2013-09-27 MED ORDER — IBUPROFEN 800 MG PO TABS: 800.0000 mg | ORAL_TABLET | Freq: Three times a day (TID) | ORAL | Status: DC

## 2013-09-27 NOTE — ED Provider Notes (Signed)
CSN: [id number]     Arrival date & time 09/27/13  2028 History  This chart was scribed for non-physician practitioner Felipa Furnaceobert Dorene Bruni,PA-C working with Flint MelterElliott L Wentz, MD by Joaquin MusicKristina Sanchez-Matthews, ED Scribe. This patient was seen in room TR06C/TR06C and the patient's care was started at 9:34 PM .   Chief Complaint  Patient presents with  . Elbow Pain   The history is provided by the patient. No language interpreter was used.   HPI Comments: Marissa Smith is a [age] y.o. female who presents to the Emergency Department complaining of ongoing R elbow pain that began 1 week ago and has not resolved. Pt states she does significant lifting while at work and does not recall having an injury. Reports having worsening pain with movement and certain positions. States she has shooting pain with tingling down R fingers; reports having occasional numbness to fingers in R hand. Hx of having cortisone injections to ankles. Has been applying ice. Denies neck pain.   Past Medical History  Diagnosis Date  . Back pain, chronic   . Depressed bipolar I disorder    Past Surgical History  Procedure Laterality Date  . Cholecystectomy     No family history on file. History  Substance Use Topics  . Smoking status: Current Every Day Smoker -- 0.20 packs/day    Types: Cigarettes  . Smokeless tobacco: Not on file  . Alcohol Use: No   OB History   Grav Para Term Preterm Abortions TAB SAB Ect Mult Living                 Review of Systems  Musculoskeletal: Positive for arthralgias and myalgias. Negative for neck pain and neck stiffness.  Neurological: Positive for numbness. Negative for weakness.   Allergies  Darvocet and Miconazole nitrate  Home Medications   Prior to Admission medications   Medication Sig Start Date End Date Taking? Authorizing Provider  acetaminophen (TYLENOL) 500 MG tablet Take 1,000 mg by mouth every 6 (six) hours as needed for mild pain.    Historical Provider, MD  azithromycin  (ZITHROMAX) 250 MG tablet Take 4 tablets (1,000 mg total) by mouth once. 07/28/13   Brock Badharles A Harper, MD  Cefixime 500 MG/5ML SUSR Take 400 mg by mouth once. 08/01/13   Brock Badharles A Harper, MD  HYDROcodone-acetaminophen (NORCO/VICODIN) 5-325 MG per tablet Take 1-2 tablets by mouth every 4 (four) hours as needed. 07/11/13   Teressa LowerVrinda Pickering, NP  ibuprofen (ADVIL,MOTRIN) 200 MG tablet Take 400 mg by mouth every 6 (six) hours as needed for pain.    Historical Provider, MD  levonorgestrel (MIRENA) 20 MCG/24HR IUD 1 each by Intrauterine route once.    Historical Provider, MD  loratadine (CLARITIN) 10 MG tablet Take 10 mg by mouth daily.    Historical Provider, MD  Multiple Vitamins-Minerals (MULTIVITAMIN WITH MINERALS) tablet Take 1 tablet by mouth daily.    Historical Provider, MD   BP 110/65  Pulse 64  Temp(Src) 98.5 F (36.9 C) (Oral)  Resp 16  Wt 163 lb 8 oz (74.163 kg)  SpO2 97%  LMP 08/30/2013  Physical Exam  Nursing note and vitals reviewed. Constitutional: She is oriented to person, place, and time. She appears well-developed and well-nourished. No distress.  HENT:  Head: Normocephalic and atraumatic.  Eyes: EOM are normal.  Neck: Neck supple. No tracheal deviation present.  Cardiovascular: Normal rate and intact distal pulses.   Brisk cap refill  Pulmonary/Chest: Effort normal. No respiratory distress.  Musculoskeletal: Normal range  of motion.  R elbow moderately tender to palpation over the medial and lateral epicondyl. Mild pain with resisted supination and pronation. ROM and strength 5/5.  Neurological: She is alert and oriented to person, place, and time.  Distal sensation intact.  Skin: Skin is warm and dry.  Psychiatric: She has a normal mood and affect. Her behavior is normal.   ED Course  Procedures (including critical care time) DIAGNOSTIC STUDIES: Oxygen Saturation is 97% on RA, normal by my interpretation.    COORDINATION OF CARE: 9:38 PM-Discussed treatment plan  which includes informed pt of radiology findings, will discharge pt with pain medication and provide referral to orthopedist.  Pt agreed to plan.   Labs Review Labs Reviewed - No data to display  Imaging Review Dg Elbow Complete Right  09/27/2013   CLINICAL DATA:  Right elbow pain and numbness in the right hand  EXAM: RIGHT ELBOW - COMPLETE 3+ VIEW  COMPARISON:  None.  FINDINGS: There is no evidence of fracture, dislocation, or joint effusion. There is 8.6 mm osteophyte extending from the olecranon. Soft tissues are unremarkable.  IMPRESSION: No acute fracture or dislocation. Degenerative joint changes as described.   Electronically Signed   By: Sherian ReinWei-Chen  Lin M.D.   On: 09/27/2013 21:31     EKG Interpretation None     MDM   Final diagnoses:  Lateral epicondylitis    Patient with lateral epicondylitis. Rice, NSAIDs, and orthopedic followup. Patient is stable and ready for discharge.  I personally performed the services described in this documentation, which was scribed in my presence. The recorded information has been reviewed and is accurate.    Roxy Horsemanobert Iren Whipp, PA-C 09/28/13 0030

## 2013-09-27 NOTE — Discharge Instructions (Signed)
Lateral Epicondylitis (Tennis Elbow) with Rehab Lateral epicondylitis involves inflammation and pain around the outer portion of the elbow. The pain is caused by inflammation of the tendons in the forearm that bring back (extend) the wrist. Lateral epicondylittis is also called tennis elbow, because it is very common in tennis players. However, it Lundblad occur in any individual who extends the wrist repetitively. If lateral epicondylitis is left untreated, it [place] become a chronic problem. SYMPTOMS   Pain, tenderness, and inflammation on the outer (lateral) side of the elbow.  Pain or weakness with gripping activities.  Pain that increases with wrist twisting motions (playing tennis, using a screwdriver, opening a door or a jar).  Pain with lifting objects, including a coffee cup. CAUSES  Lateral epicondylitis is caused by inflammation of the tendons that extend the wrist. Causes of injury Harris include:  Repetitive stress and strain on the muscles and tendons that extend the wrist.  Sudden change in activity level or intensity.  Incorrect grip in racquet sports.  Incorrect grip size of racquet (often too large).  Incorrect hitting position or technique (usually backhand, leading with the elbow).  Using a racket that is too heavy. RISK INCREASES WITH:  Sports or occupations that require repetitive and/or strenuous forearm and wrist movements (tennis, squash, racquetball, carpentry).  Poor wrist and forearm strength and flexibility.  Failure to warm up properly before activity.  Resuming activity before healing, rehabilitation, and conditioning are complete. PREVENTION   Warm up and stretch properly before activity.  Maintain physical fitness:  Strength, flexibility, and endurance.  Cardiovascular fitness.  Wear and use properly fitted equipment.  Learn and use proper technique and have a coach correct improper technique.  Wear a tennis elbow (counterforce) brace. PROGNOSIS   The course of this condition depends on the degree of the injury. If treated properly, acute cases (symptoms lasting less than 4 weeks) are often resolved in 2 to 6 weeks. Chronic (longer lasting cases) often resolve in 3 to 6 months, but [place] require physical therapy. RELATED COMPLICATIONS   Frequently recurring symptoms, resulting in a chronic problem. Properly treating the problem the first time decreases frequency of recurrence.  Chronic inflammation, scarring tendon degeneration, and partial tendon tear, requiring surgery.  Delayed healing or resolution of symptoms. TREATMENT  Treatment first involves the use of ice and medicine, to reduce pain and inflammation. Strengthening and stretching exercises [place] help reduce discomfort, if performed regularly. These exercises Musselman be performed at home, if the condition is an acute injury. Chronic cases Amsler require a referral to a physical therapist for evaluation and treatment. Your caregiver Fuhrman advise a corticosteroid injection, to help reduce inflammation. Rarely, surgery is needed. MEDICATION  If pain medicine is needed, nonsteroidal anti-inflammatory medicines (aspirin and ibuprofen), or other minor pain relievers (acetaminophen), are often advised.  Do not take pain medicine for 7 days before surgery.  Prescription pain relievers Arth be given, if your caregiver thinks they are needed. Use only as directed and only as much as you need.  Corticosteroid injections [place] be recommended. These injections should be reserved only for the most severe cases, because they can only be given a certain number of times. HEAT AND COLD  Cold treatment (icing) should be applied for 10 to 15 minutes every 2 to 3 hours for inflammation and pain, and immediately after activity that aggravates your symptoms. Use ice packs or an ice massage.  Heat treatment Rebstock be used before performing stretching and strengthening activities prescribed by your   caregiver, physical  therapist, or athletic trainer. Use a heat pack or a warm water soak. SEEK MEDICAL CARE IF: Symptoms get worse or do not improve in 2 weeks, despite treatment. EXERCISES  RANGE OF MOTION (ROM) AND STRETCHING EXERCISES - Epicondylitis, Lateral (Tennis Elbow) These exercises [place] help you when beginning to rehabilitate your injury. Your symptoms Shepardson go away with or without further involvement from your physician, physical therapist or athletic trainer. While completing these exercises, remember:   Restoring tissue flexibility helps normal motion to return to the joints. This allows healthier, less painful movement and activity.  An effective stretch should be held for at least 30 seconds.  A stretch should never be painful. You should only feel a gentle lengthening or release in the stretched tissue. RANGE OF MOTION - Wrist Flexion, Active-Assisted  Extend your right / left elbow with your fingers pointing down.*  Gently pull the back of your hand towards you, until you feel a gentle stretch on the top of your forearm.  Hold this position for __________ seconds. Repeat __________ times. Complete this exercise __________ times per day.  *If directed by your physician, physical therapist or athletic trainer, complete this stretch with your elbow bent, rather than extended. RANGE OF MOTION - Wrist Extension, Active-Assisted  Extend your right / left elbow and turn your palm upwards.*  Gently pull your palm and fingertips back, so your wrist extends and your fingers point more toward the ground.  You should feel a gentle stretch on the inside of your forearm.  Hold this position for __________ seconds. Repeat __________ times. Complete this exercise __________ times per day. *If directed by your physician, physical therapist or athletic trainer, complete this stretch with your elbow bent, rather than extended. STRETCH - Wrist Flexion  Place the back of your right / left hand on a tabletop,  leaving your elbow slightly bent. Your fingers should point away from your body.  Gently press the back of your hand down onto the table by straightening your elbow. You should feel a stretch on the top of your forearm.  Hold this position for __________ seconds. Repeat __________ times. Complete this stretch __________ times per day.  STRETCH - Wrist Extension   Place your right / left fingertips on a tabletop, leaving your elbow slightly bent. Your fingers should point backwards.  Gently press your fingers and palm down onto the table by straightening your elbow. You should feel a stretch on the inside of your forearm.  Hold this position for __________ seconds. Repeat __________ times. Complete this stretch __________ times per day.  STRENGTHENING EXERCISES - Epicondylitis, Lateral (Tennis Elbow) These exercises Linarez help you when beginning to rehabilitate your injury. They [place] resolve your symptoms with or without further involvement from your physician, physical therapist or athletic trainer. While completing these exercises, remember:   Muscles can gain both the endurance and the strength needed for everyday activities through controlled exercises.  Complete these exercises as instructed by your physician, physical therapist or athletic trainer. Increase the resistance and repetitions only as guided.  You Mclamb experience muscle soreness or fatigue, but the pain or discomfort you are trying to eliminate should never worsen during these exercises. If this pain does get worse, stop and make sure you are following the directions exactly. If the pain is still present after adjustments, discontinue the exercise until you can discuss the trouble with your caregiver. STRENGTH - Wrist Flexors  Sit with your right / left forearm palm-up   and fully supported on a table or countertop. Your elbow should be resting below the height of your shoulder. Allow your wrist to extend over the edge of the  surface.  Loosely holding a __________ weight, or a piece of rubber exercise band or tubing, slowly curl your hand up toward your forearm.  Hold this position for __________ seconds. Slowly lower the wrist back to the starting position in a controlled manner. Repeat __________ times. Complete this exercise __________ times per day.  STRENGTH - Wrist Extensors  Sit with your right / left forearm palm-down and fully supported on a table or countertop. Your elbow should be resting below the height of your shoulder. Allow your wrist to extend over the edge of the surface.  Loosely holding a __________ weight, or a piece of rubber exercise band or tubing, slowly curl your hand up toward your forearm.  Hold this position for __________ seconds. Slowly lower the wrist back to the starting position in a controlled manner. Repeat __________ times. Complete this exercise __________ times per day.  STRENGTH - Ulnar Deviators  Stand with a ____________________ weight in your right / left hand, or sit while holding a rubber exercise band or tubing, with your healthy arm supported on a table or countertop.  Move your wrist, so that your pinkie travels toward your forearm and your thumb moves away from your forearm.  Hold this position for __________ seconds and then slowly lower the wrist back to the starting position. Repeat __________ times. Complete this exercise __________ times per day STRENGTH - Radial Deviators  Stand with a ____________________ weight in your right / left hand, or sit while holding a rubber exercise band or tubing, with your injured arm supported on a table or countertop.  Raise your hand upward in front of you or pull up on the rubber tubing.  Hold this position for __________ seconds and then slowly lower the wrist back to the starting position. Repeat __________ times. Complete this exercise __________ times per day. STRENGTH - Forearm Supinators   Sit with your right /  left forearm supported on a table, keeping your elbow below shoulder height. Rest your hand over the edge, palm down.  Gently grip a hammer or a soup ladle.  Without moving your elbow, slowly turn your palm and hand upward to a "thumbs-up" position.  Hold this position for __________ seconds. Slowly return to the starting position. Repeat __________ times. Complete this exercise __________ times per day.  STRENGTH - Forearm Pronators   Sit with your right / left forearm supported on a table, keeping your elbow below shoulder height. Rest your hand over the edge, palm up.  Gently grip a hammer or a soup ladle.  Without moving your elbow, slowly turn your palm and hand upward to a "thumbs-up" position.  Hold this position for __________ seconds. Slowly return to the starting position. Repeat __________ times. Complete this exercise __________ times per day.  STRENGTH - Grip  Grasp a tennis ball, a dense sponge, or a large, rolled sock in your hand.  Squeeze as hard as you can, without increasing any pain.  Hold this position for __________ seconds. Release your grip slowly. Repeat __________ times. Complete this exercise __________ times per day.  STRENGTH - Elbow Extensors, Isometric  Stand or sit upright, on a firm surface. Place your right / left arm so that your palm faces your stomach, and it is at the height of your waist.  Place your opposite hand   on the underside of your forearm. Gently push up as your right / left arm resists. Push as hard as you can with both arms, without causing any pain or movement at your right / left elbow. Hold this stationary position for __________ seconds. Gradually release the tension in both arms. Allow your muscles to relax completely before repeating. Document Released: 03/30/2005 Document Revised: 06/22/2011 Document Reviewed: 07/12/2008 Exit[employer] Patient Information 2015 [employer]. This information is not intended to replace advice given  to you by your health care provider. Make sure you discuss any questions you have with your health care provider.  

## 2013-09-27 NOTE — ED Notes (Signed)
Pt. reports pain at right elbow onset 1 week ago , denies injury , pain worse with movement and certain positions .

## 2013-09-29 NOTE — ED Provider Notes (Signed)
Medical screening examination/treatment/procedure(s) were performed by non-physician practitioner and as supervising physician I was immediately available for consultation/collaboration.   EKG Interpretation None       Jamis Kryder L Myron Lona, MD 09/29/13 1609 

## 2013-11-07 ENCOUNTER — Emergency Department (INDEPENDENT_AMBULATORY_CARE_PROVIDER_SITE_OTHER)
Admission: EM | Admit: 2013-11-07 | Discharge: 2013-11-07 | Disposition: A | Discharge status: Home / Self Care | Payer: Medicaid Other | Source: Home / Self Care | Attending: Emergency Medicine | Admitting: Emergency Medicine

## 2013-11-07 DIAGNOSIS — T7840XA Allergy, unspecified, initial encounter: Secondary | ICD-10-CM

## 2013-11-07 MED ORDER — MUPIROCIN CALCIUM 2 % NA OINT
TOPICAL_OINTMENT | NASAL | Status: DC
Start: 2013-11-07 — End: 2014-01-14

## 2013-11-07 MED ORDER — ERYTHROMYCIN 5 MG/GM OP OINT: TOPICAL_OINTMENT | OPHTHALMIC | Status: DC

## 2013-11-07 MED ORDER — PREDNISONE 50 MG PO TABS: ORAL_TABLET | ORAL | Status: DC

## 2013-11-07 MED ORDER — METHYLPREDNISOLONE SODIUM SUCC 125 MG IJ SOLR
INTRAMUSCULAR | Status: AC
  Filled 2013-11-07: qty 2

## 2013-11-07 MED ORDER — METHYLPREDNISOLONE SODIUM SUCC 125 MG IJ SOLR
80.0000 mg | Freq: Once | INTRAMUSCULAR | Status: AC
  Administered 2013-11-07: 80 mg via INTRAMUSCULAR

## 2013-11-07 NOTE — ED Notes (Signed)
Pt   Reports  symptoms     Of  Rash  That  Itches  Over  sev  Different  Parts   Of     Her      Body  No  New  Known  Causative  Agents   Such  As  Newly  Started  meds       Pt  States        It  Feels  Like  Her  Throat  Seems  Tight  however  She  Is  Speaking in  Complete  sentances  And  Has  No  Angioedema       Her  Skin is  Warm and  Dry  And  She  Is  Alert  And  Oriented

## 2013-11-07 NOTE — Discharge Instructions (Signed)
You are having an allergic reaction to something.  We gave you a steroid shot today. Start the prednisone tomorrow. Continue to use benadryl as needed. Take zyrtec daily. Use the hydrocortisone cream as needed. Use the erythromycin ointment in the right eye 4 times a day for 7 days. Apply mupirocin ointment to your nose twice a day for 7 days.   Go to the ER if you start having trouble breathing, you develop fevers, or you start vomiting.

## 2013-11-07 NOTE — ED Provider Notes (Signed)
CSN: [id number]     Arrival date & time 11/07/13  1748 History   First MD Initiated Contact with Patient 11/07/13 1820     Chief Complaint  Patient presents with  . Rash   (Consider location/radiation/quality/duration/timing/severity/associated sxs/prior Treatment) HPI She is here today for evaluation of allergic reaction. She states this started yesterday with a rash on her right cheek. The rash has spread over her face, upper chest, and arms. It is very itchy. She also reports a gritty feeling in her right eye. Her nose has been swelling and is painful as well. She states her throat started feeling funny today, which prompted her to come in. She states this has happened at least once before in the last year, and required epinephrine and steroids. She denies any new medications, new lotions, creams, detergents. She denies any shortness of breath. No fevers. She has been taking Benadryl and using hydrocortisone cream.  Past Medical History  Diagnosis Date  . Back pain, chronic   . Depressed bipolar I disorder    Past Surgical History  Procedure Laterality Date  . Cholecystectomy     No family history on file. History  Substance Use Topics  . Smoking status: Current Every Day Smoker -- 0.20 packs/day    Types: Cigarettes  . Smokeless tobacco: Not on file  . Alcohol Use: No   OB History   Grav Para Term Preterm Abortions TAB SAB Ect Mult Living                 Review of Systems  Constitutional: Negative.   HENT:       Funny feeling in throat  Eyes: Positive for itching.  Respiratory: Negative.   Cardiovascular: Negative.   Gastrointestinal: Negative.   Skin: Positive for rash.  Neurological: Negative.     Allergies  Darvocet and Miconazole nitrate  Home Medications   Prior to Admission medications   Medication Sig Start Date End Date Taking? Authorizing Provider  acetaminophen (TYLENOL) 500 MG tablet Take 1,000 mg by mouth every 6 (six) hours as needed for mild pain.     Historical Provider, MD  azithromycin (ZITHROMAX) 250 MG tablet Take 4 tablets (1,000 mg total) by mouth once. 07/28/13   Brock Badharles A Harper, MD  Cefixime 500 MG/5ML SUSR Take 400 mg by mouth once. 08/01/13   Brock Badharles A Harper, MD  erythromycin ophthalmic ointment Place a 1/2 inch ribbon of ointment into the lower right eyelid 4 times a day for 1 week. 11/07/13   Charm RingsErin J Nicholos Aloisi, MD  HYDROcodone-acetaminophen (NORCO/VICODIN) 5-325 MG per tablet Take 1-2 tablets by mouth every 4 (four) hours as needed. 07/11/13   Teressa LowerVrinda Pickering, NP  HYDROcodone-acetaminophen (NORCO/VICODIN) 5-325 MG per tablet Take 1-2 tablets by mouth every 6 (six) hours as needed. 09/27/13   Roxy Horsemanobert Browning, PA-C  ibuprofen (ADVIL,MOTRIN) 200 MG tablet Take 400 mg by mouth every 6 (six) hours as needed for pain.    Historical Provider, MD  ibuprofen (ADVIL,MOTRIN) 800 MG tablet Take 1 tablet (800 mg total) by mouth 3 (three) times daily. 09/27/13   Roxy Horsemanobert Browning, PA-C  levonorgestrel (MIRENA) 20 MCG/24HR IUD 1 each by Intrauterine route once.    Historical Provider, MD  loratadine (CLARITIN) 10 MG tablet Take 10 mg by mouth daily.    Historical Provider, MD  Multiple Vitamins-Minerals (MULTIVITAMIN WITH MINERALS) tablet Take 1 tablet by mouth daily.    Historical Provider, MD  mupirocin nasal ointment (BACTROBAN) 2 % Apply in each nostril daily for  7 days. 11/07/13   Charm Rings, MD  predniSONE (DELTASONE) 50 MG tablet Take 1 tablet daily for 5 days. 11/07/13   Charm Rings, MD   BP 132/70  Pulse 82  Temp(Src) 98.6 F (37 C) (Oral)  Resp 18  SpO2 100%  LMP 11/04/2013 Physical Exam  Constitutional: She is oriented to person, place, and time. She appears well-developed and well-nourished. No distress.  HENT:  Head: Normocephalic and atraumatic.  Mouth/Throat: Oropharynx is clear and moist. No oropharyngeal exudate.  No angioedema. Nose is swollen and slightly erythematous.  Eyes: Conjunctivae are normal. Pupils are equal, round,  and reactive to light. Right eye exhibits no discharge. Left eye exhibits no discharge. No scleral icterus.  Neck: Normal range of motion. Neck supple.  Cardiovascular: Normal rate, regular rhythm and normal heart sounds.   No murmur heard. Pulmonary/Chest: Effort normal and breath sounds normal. No respiratory distress. She has no wheezes. She has no rales.  Neurological: She is alert and oriented to person, place, and time.  Skin: Skin is warm and dry. Rash (erythematous macuo-papular rash over face, chest and arms.) noted.    ED Course  Procedures (including critical care time) Labs Review Labs Reviewed - No data to display  Imaging Review No results found.   MDM   1. Allergic reaction, initial encounter    Unknown allergen. Solu-Medrol 80 mg IM given today. Prednisone 50 mg daily x5 days. Continue Benadryl and hydrocortisone cream as needed. Recommended followup with an allergist. Reviewed warning signs that should prompt her to go to the emergency room.    Charm Rings, MD 11/07/13 [phone]

## 2014-01-14 ENCOUNTER — Encounter (HOSPITAL_COMMUNITY): Payer: Self-pay | Admitting: Emergency Medicine

## 2014-01-14 ENCOUNTER — Emergency Department (HOSPITAL_COMMUNITY)
Admission: EM | Admit: 2014-01-14 | Discharge: 2014-01-14 | Disposition: A | Discharge status: Home / Self Care | Payer: Medicaid Other | Attending: Emergency Medicine | Admitting: Emergency Medicine

## 2014-01-14 DIAGNOSIS — R05 Cough: Secondary | ICD-10-CM | POA: Diagnosis present

## 2014-01-14 DIAGNOSIS — F319 Bipolar disorder, unspecified: Secondary | ICD-10-CM | POA: Diagnosis not present

## 2014-01-14 DIAGNOSIS — G8929 Other chronic pain: Secondary | ICD-10-CM | POA: Insufficient documentation

## 2014-01-14 DIAGNOSIS — J069 Acute upper respiratory infection, unspecified: Secondary | ICD-10-CM | POA: Diagnosis not present

## 2014-01-14 DIAGNOSIS — Z720 Tobacco use: Secondary | ICD-10-CM | POA: Insufficient documentation

## 2014-01-14 DIAGNOSIS — R51 Headache: Secondary | ICD-10-CM | POA: Diagnosis not present

## 2014-01-14 DIAGNOSIS — Z79899 Other long term (current) drug therapy: Secondary | ICD-10-CM | POA: Diagnosis not present

## 2014-01-14 MED ORDER — BENZONATATE 100 MG PO CAPS: 100.0000 mg | ORAL_CAPSULE | Freq: Three times a day (TID) | ORAL | Status: DC

## 2014-01-14 MED ORDER — IBUPROFEN 800 MG PO TABS: 800.0000 mg | ORAL_TABLET | Freq: Three times a day (TID) | ORAL | Status: DC

## 2014-01-14 MED ORDER — HYDROCODONE-ACETAMINOPHEN 5-325 MG PO TABS: 1.0000 | ORAL_TABLET | Freq: Four times a day (QID) | ORAL | Status: DC | PRN

## 2014-01-14 NOTE — ED Notes (Signed)
Pt complaining of cough, HA and she has been around someone sick at work.

## 2014-01-14 NOTE — Discharge Instructions (Signed)
Upper Respiratory Infection, Adult An upper respiratory infection (URI) is also sometimes known as the common cold. The upper respiratory tract includes the nose, sinuses, throat, trachea, and bronchi. Bronchi are the airways leading to the lungs. Most people improve within 1 week, but symptoms can last up to 2 weeks. A residual cough Brockman last even longer.  CAUSES Many different viruses can infect the tissues lining the upper respiratory tract. The tissues become irritated and inflamed and often become very moist. Mucus production is also common. A cold is contagious. You can easily spread the virus to others by oral contact. This includes kissing, sharing a glass, coughing, or sneezing. Touching your mouth or nose and then touching a surface, which is then touched by another person, can also spread the virus. SYMPTOMS  Symptoms typically develop 1 to 3 days after you come in contact with a cold virus. Symptoms vary from person to person. They Bertone include:  Runny nose.  Sneezing.  Nasal congestion.  Sinus irritation.  Sore throat.  Loss of voice (laryngitis).  Cough.  Fatigue.  Muscle aches.  Loss of appetite.  Headache.  Low-grade fever. DIAGNOSIS  You might diagnose your own cold based on familiar symptoms, since most people get a cold 2 to 3 times a year. Your caregiver can confirm this based on your exam. Most importantly, your caregiver can check that your symptoms are not due to another disease such as strep throat, sinusitis, pneumonia, asthma, or epiglottitis. Blood tests, throat tests, and X-rays are not necessary to diagnose a common cold, but they Raver sometimes be helpful in excluding other more serious diseases. Your caregiver will decide if any further tests are required. RISKS AND COMPLICATIONS  You Caba be at risk for a more severe case of the common cold if you smoke cigarettes, have chronic heart disease (such as heart failure) or lung disease (such as asthma), or if  you have a weakened immune system. The very young and very old are also at risk for more serious infections. Bacterial sinusitis, middle ear infections, and bacterial pneumonia can complicate the common cold. The common cold can worsen asthma and chronic obstructive pulmonary disease (COPD). Sometimes, these complications can require emergency medical care and Settlemyre be life-threatening. PREVENTION  The best way to protect against getting a cold is to practice good hygiene. Avoid oral or hand contact with people with cold symptoms. Wash your hands often if contact occurs. There is no clear evidence that vitamin C, vitamin E, echinacea, or exercise reduces the chance of developing a cold. However, it is always recommended to get plenty of rest and practice good nutrition. TREATMENT  Treatment is directed at relieving symptoms. There is no cure. Antibiotics are not effective, because the infection is caused by a virus, not by bacteria. Treatment Rehmann include:  Increased fluid intake. Sports drinks offer valuable electrolytes, sugars, and fluids.  Breathing heated mist or steam (vaporizer or shower).  Eating chicken soup or other clear broths, and maintaining good nutrition.  Getting plenty of rest.  Using gargles or lozenges for comfort.  Controlling fevers with ibuprofen or acetaminophen as directed by your caregiver.  Increasing usage of your inhaler if you have asthma. Zinc gel and zinc lozenges, taken in the first 24 hours of the common cold, can shorten the duration and lessen the severity of symptoms. Pain medicines Holleman help with fever, muscle aches, and throat pain. A variety of non-prescription medicines are available to treat congestion and runny nose. Your caregiver   can make recommendations and Sequeira suggest nasal or lung inhalers for other symptoms.  HOME CARE INSTRUCTIONS   Only take over-the-counter or prescription medicines for pain, discomfort, or fever as directed by your  caregiver.  Use a warm mist humidifier or inhale steam from a shower to increase air moisture. This Gundrum keep secretions moist and make it easier to breathe.  Drink enough water and fluids to keep your urine clear or pale yellow.  Rest as needed.  Return to work when your temperature has returned to normal or as your caregiver advises. You Walby need to stay home longer to avoid infecting others. You can also use a face mask and careful hand washing to prevent spread of the virus. SEEK MEDICAL CARE IF:   After the first few days, you feel you are getting worse rather than better.  You need your caregiver's advice about medicines to control symptoms.  You develop chills, worsening shortness of breath, or brown or red sputum. These Swartzentruber be signs of pneumonia.  You develop yellow or brown nasal discharge or pain in the face, especially when you bend forward. These Genna be signs of sinusitis.  You develop a fever, swollen neck glands, pain with swallowing, or white areas in the back of your throat. These Kuck be signs of strep throat. SEEK IMMEDIATE MEDICAL CARE IF:   You have a fever.  You develop severe or persistent headache, ear pain, sinus pain, or chest pain.  You develop wheezing, a prolonged cough, cough up blood, or have a change in your usual mucus (if you have chronic lung disease).  You develop sore muscles or a stiff neck. Document Released: 09/23/2000 Document Revised: 06/22/2011 Document Reviewed: 07/05/2013 ExitCare Patient Information 2015 [employer]. This information is not intended to replace advice given to you by your health care provider. Make sure you discuss any questions you have with your health care provider.  

## 2014-01-14 NOTE — ED Provider Notes (Signed)
CSN: [id number]     Arrival date & time 01/14/14  1610 History   First MD Initiated Contact with Patient 01/14/14 1620     Chief Complaint  Patient presents with  . Cough  . Headache    Patient is a 28 y.o. female presenting with cough and headaches. The history is provided by the patient.  Cough Cough characteristics:  Non-productive Severity:  Moderate Onset quality:  Gradual Duration:  1 week Timing:  Intermittent Progression:  Worsening Chronicity:  New Context: sick contacts (Someone at work has pneumonia)   Relieved by:  Nothing (has tried multiple otc medications) Ineffective treatments:  Decongestant and cough suppressants Associated symptoms: chills, fever, headaches, myalgias, rhinorrhea and sinus congestion   Associated symptoms: no chest pain, no rash, no shortness of breath, no sore throat and no wheezing   Headache Associated symptoms: cough, fever and myalgias   Associated symptoms: no sore throat     Past Medical History  Diagnosis Date  . Back pain, chronic   . Depressed bipolar I disorder    Past Surgical History  Procedure Laterality Date  . Cholecystectomy     History reviewed. No pertinent family history. History  Substance Use Topics  . Smoking status: Current Every Day Smoker -- 0.20 packs/day    Types: Cigarettes  . Smokeless tobacco: Not on file  . Alcohol Use: No   OB History   Grav Para Term Preterm Abortions TAB SAB Ect Mult Living                 Review of Systems  Constitutional: Positive for fever and chills.  HENT: Positive for rhinorrhea. Negative for sore throat.   Respiratory: Positive for cough. Negative for shortness of breath and wheezing.   Cardiovascular: Negative for chest pain.  Musculoskeletal: Positive for myalgias.  Skin: Negative for rash.  Neurological: Positive for headaches.  All other systems reviewed and are negative.     Allergies  Darvocet and Miconazole nitrate  Home Medications   Prior to  Admission medications   Medication Sig Start Date End Date Taking? Authorizing Provider  acetaminophen (TYLENOL) 500 MG tablet Take 1,000 mg by mouth every 6 (six) hours as needed for mild pain.    Historical Provider, MD  azithromycin (ZITHROMAX) 250 MG tablet Take 4 tablets (1,000 mg total) by mouth once. 07/28/13   Brock Bad, MD  Cefixime 500 MG/5ML SUSR Take 400 mg by mouth once. 08/01/13   Brock Bad, MD  erythromycin ophthalmic ointment Place a 1/2 inch ribbon of ointment into the lower right eyelid 4 times a day for 1 week. 11/07/13   Charm Rings, MD  HYDROcodone-acetaminophen (NORCO/VICODIN) 5-325 MG per tablet Take 1-2 tablets by mouth every 4 (four) hours as needed. 07/11/13   Teressa Lower, NP  HYDROcodone-acetaminophen (NORCO/VICODIN) 5-325 MG per tablet Take 1-2 tablets by mouth every 6 (six) hours as needed. 09/27/13   Roxy Horseman, PA-C  ibuprofen (ADVIL,MOTRIN) 200 MG tablet Take 400 mg by mouth every 6 (six) hours as needed for pain.    Historical Provider, MD  ibuprofen (ADVIL,MOTRIN) 800 MG tablet Take 1 tablet (800 mg total) by mouth 3 (three) times daily. 09/27/13   Roxy Horseman, PA-C  levonorgestrel (MIRENA) 20 MCG/24HR IUD 1 each by Intrauterine route once.    Historical Provider, MD  loratadine (CLARITIN) 10 MG tablet Take 10 mg by mouth daily.    Historical Provider, MD  Multiple Vitamins-Minerals (MULTIVITAMIN WITH MINERALS) tablet Take 1 tablet  by mouth daily.    Historical Provider, MD  mupirocin nasal ointment (BACTROBAN) 2 % Apply in each nostril daily for 7 days. 11/07/13   Charm RingsErin J Honig, MD  predniSONE (DELTASONE) 50 MG tablet Take 1 tablet daily for 5 days. 11/07/13   Charm RingsErin J Honig, MD   BP 127/66  Pulse 75  Temp(Src) 98.3 F (36.8 C) (Oral)  Resp 18  Ht 5\' 5"  (1.651 m)  Wt 164 lb 6 oz (74.56 kg)  BMI 27.35 kg/m2  SpO2 98%  LMP 12/24/2013 Physical Exam  Nursing note and vitals reviewed. Constitutional: She appears well-developed and  well-nourished. No distress.  HENT:  Head: Normocephalic and atraumatic.  Right Ear: External ear normal.  Left Ear: External ear normal.  Eyes: Conjunctivae are normal. Right eye exhibits no discharge. Left eye exhibits no discharge. No scleral icterus.  Neck: Neck supple. No tracheal deviation present.  Cardiovascular: Normal rate, regular rhythm and intact distal pulses.   Pulmonary/Chest: Effort normal and breath sounds normal. No stridor. No respiratory distress. She has no wheezes. She has no rales.  Abdominal: Soft. Bowel sounds are normal. She exhibits no distension. There is no tenderness. There is no rebound and no guarding.  Musculoskeletal: She exhibits no edema and no tenderness.  Neurological: She is alert. She has normal strength. No cranial nerve deficit (no facial droop, extraocular movements intact, no slurred speech) or sensory deficit. She exhibits normal muscle tone. She displays no seizure activity. Coordination normal.  Skin: Skin is warm and dry. No rash noted.  Psychiatric: She has a normal mood and affect.    ED Course  Procedures (including critical care time)  MDM   Final diagnoses:  URI, acute    Symptoms are consistent with a simple upper respiratory infection. There is no evidence to suggest pneumonia on my exam. The patient does not appear to have an otitis media. I discussed supportive treatment. I encouraged followup with the primary care doctor next week if symptoms have not resolved. Warning signs and reasons to return to the emergency room were discussed      Linwood DibblesJon Annahi Short, MD 01/14/14 [phone]

## 2014-02-10 ENCOUNTER — Encounter (HOSPITAL_COMMUNITY): Payer: Self-pay | Admitting: Emergency Medicine

## 2014-02-10 ENCOUNTER — Emergency Department (HOSPITAL_COMMUNITY)
Admission: EM | Admit: 2014-02-10 | Discharge: 2014-02-11 | Disposition: A | Discharge status: Home / Self Care | Payer: Medicaid Other | Attending: Emergency Medicine | Admitting: Emergency Medicine

## 2014-02-10 DIAGNOSIS — G8929 Other chronic pain: Secondary | ICD-10-CM | POA: Diagnosis not present

## 2014-02-10 DIAGNOSIS — Z79899 Other long term (current) drug therapy: Secondary | ICD-10-CM | POA: Insufficient documentation

## 2014-02-10 DIAGNOSIS — Z8659 Personal history of other mental and behavioral disorders: Secondary | ICD-10-CM | POA: Insufficient documentation

## 2014-02-10 DIAGNOSIS — Z720 Tobacco use: Secondary | ICD-10-CM | POA: Insufficient documentation

## 2014-02-10 DIAGNOSIS — R05 Cough: Secondary | ICD-10-CM | POA: Diagnosis present

## 2014-02-10 DIAGNOSIS — Z8739 Personal history of other diseases of the musculoskeletal system and connective tissue: Secondary | ICD-10-CM | POA: Insufficient documentation

## 2014-02-10 DIAGNOSIS — J069 Acute upper respiratory infection, unspecified: Secondary | ICD-10-CM | POA: Diagnosis not present

## 2014-02-10 MED ORDER — BENZONATATE 100 MG PO CAPS: 200.0000 mg | ORAL_CAPSULE | Freq: Two times a day (BID) | ORAL | Status: DC | PRN

## 2014-02-10 MED ORDER — DIPHENHYDRAMINE HCL 25 MG PO TABS: 25.0000 mg | ORAL_TABLET | Freq: Four times a day (QID) | ORAL | Status: DC

## 2014-02-10 MED ORDER — GUAIFENESIN 100 MG/5ML PO SYRP: 100.0000 mg | ORAL_SOLUTION | ORAL | Status: DC | PRN

## 2014-02-10 NOTE — ED Notes (Signed)
Pt complains of a cough and chills since yesterday

## 2014-02-10 NOTE — Discharge Instructions (Signed)
Upper Respiratory Infection, Adult An upper respiratory infection (URI) is also sometimes known as the common cold. The upper respiratory tract includes the nose, sinuses, throat, trachea, and bronchi. Bronchi are the airways leading to the lungs. Most people improve within 1 week, but symptoms can last up to 2 weeks. A residual cough Petrasek last even longer.  CAUSES Many different viruses can infect the tissues lining the upper respiratory tract. The tissues become irritated and inflamed and often become very moist. Mucus production is also common. A cold is contagious. You can easily spread the virus to others by oral contact. This includes kissing, sharing a glass, coughing, or sneezing. Touching your mouth or nose and then touching a surface, which is then touched by another person, can also spread the virus. SYMPTOMS  Symptoms typically develop 1 to 3 days after you come in contact with a cold virus. Symptoms vary from person to person. They Bendall include:  Runny nose.  Sneezing.  Nasal congestion.  Sinus irritation.  Sore throat.  Loss of voice (laryngitis).  Cough.  Fatigue.  Muscle aches.  Loss of appetite.  Headache.  Low-grade fever. DIAGNOSIS  You might diagnose your own cold based on familiar symptoms, since most people get a cold 2 to 3 times a year. Your caregiver can confirm this based on your exam. Most importantly, your caregiver can check that your symptoms are not due to another disease such as strep throat, sinusitis, pneumonia, asthma, or epiglottitis. Blood tests, throat tests, and X-rays are not necessary to diagnose a common cold, but they Santiago sometimes be helpful in excluding other more serious diseases. Your caregiver will decide if any further tests are required. RISKS AND COMPLICATIONS  You Ransford be at risk for a more severe case of the common cold if you smoke cigarettes, have chronic heart disease (such as heart failure) or lung disease (such as asthma), or if  you have a weakened immune system. The very young and very old are also at risk for more serious infections. Bacterial sinusitis, middle ear infections, and bacterial pneumonia can complicate the common cold. The common cold can worsen asthma and chronic obstructive pulmonary disease (COPD). Sometimes, these complications can require emergency medical care and Latella be life-threatening. PREVENTION  The best way to protect against getting a cold is to practice good hygiene. Avoid oral or hand contact with people with cold symptoms. Wash your hands often if contact occurs. There is no clear evidence that vitamin C, vitamin E, echinacea, or exercise reduces the chance of developing a cold. However, it is always recommended to get plenty of rest and practice good nutrition. TREATMENT  Treatment is directed at relieving symptoms. There is no cure. Antibiotics are not effective, because the infection is caused by a virus, not by bacteria. Treatment Arney include:  Increased fluid intake. Sports drinks offer valuable electrolytes, sugars, and fluids.  Breathing heated mist or steam (vaporizer or shower).  Eating chicken soup or other clear broths, and maintaining good nutrition.  Getting plenty of rest.  Using gargles or lozenges for comfort.  Controlling fevers with ibuprofen or acetaminophen as directed by your caregiver.  Increasing usage of your inhaler if you have asthma. Zinc gel and zinc lozenges, taken in the first 24 hours of the common cold, can shorten the duration and lessen the severity of symptoms. Pain medicines Yang help with fever, muscle aches, and throat pain. A variety of non-prescription medicines are available to treat congestion and runny nose. Your caregiver   can make recommendations and Axton suggest nasal or lung inhalers for other symptoms.  HOME CARE INSTRUCTIONS   Only take over-the-counter or prescription medicines for pain, discomfort, or fever as directed by your  caregiver.  Use a warm mist humidifier or inhale steam from a shower to increase air moisture. This Hesser keep secretions moist and make it easier to breathe.  Drink enough water and fluids to keep your urine clear or pale yellow.  Rest as needed.  Return to work when your temperature has returned to normal or as your caregiver advises. You Mengel need to stay home longer to avoid infecting others. You can also use a face mask and careful hand washing to prevent spread of the virus. SEEK MEDICAL CARE IF:   After the first few days, you feel you are getting worse rather than better.  You need your caregiver's advice about medicines to control symptoms.  You develop chills, worsening shortness of breath, or brown or red sputum. These Arrellano be signs of pneumonia.  You develop yellow or brown nasal discharge or pain in the face, especially when you bend forward. These Daughdrill be signs of sinusitis.  You develop a fever, swollen neck glands, pain with swallowing, or white areas in the back of your throat. These Abrams be signs of strep throat. SEEK IMMEDIATE MEDICAL CARE IF:   You have a fever.  You develop severe or persistent headache, ear pain, sinus pain, or chest pain.  You develop wheezing, a prolonged cough, cough up blood, or have a change in your usual mucus (if you have chronic lung disease).  You develop sore muscles or a stiff neck. Document Released: 09/23/2000 Document Revised: 06/22/2011 Document Reviewed: 07/05/2013 Exit[employer] Patient Information 2015 [employer]. This information is not intended to replace advice given to you by your health care provider. Make sure you discuss any questions you have with your health care provider.  

## 2014-02-10 NOTE — ED Provider Notes (Signed)
CSN: [id number]     Arrival date & time 02/10/14  2211 History   First MD Initiated Contact with Patient 02/10/14 2219     Chief Complaint  Patient presents with  . URI     (Consider location/radiation/quality/duration/timing/severity/associated sxs/prior Treatment) HPI Jaynee EaglesLindsay O Winch is a 28 y.o. female with no significant past medical history who comes in for evaluation of URI symptoms. Patient states her symptoms began last night, "hit me like a ton of bricks". Patient states she has had a nonproductive cough, rhinorrhea, congestion. Denies fevers, shortness of breath, sore throat, otalgia, myalgias. She reports having tried DayQuil earlier today without relief, NyQuil last night as well as Alka-Seltzer plus with minimal relief.Patient does report smoking about half a pack a day. Denies any sick contacts  Past Medical History  Diagnosis Date  . Back pain, chronic   . Depressed bipolar I disorder    Past Surgical History  Procedure Laterality Date  . Cholecystectomy     History reviewed. No pertinent family history. History  Substance Use Topics  . Smoking status: Current Every Day Smoker -- 0.20 packs/day    Types: Cigarettes  . Smokeless tobacco: Not on file  . Alcohol Use: No   OB History   Grav Para Term Preterm Abortions TAB SAB Ect Mult Living                 Review of Systems  Constitutional: Positive for chills. Negative for fever.  HENT: Positive for congestion, rhinorrhea and sinus pressure. Negative for sore throat.   Eyes: Negative for visual disturbance.  Respiratory: Positive for cough. Negative for shortness of breath.   Cardiovascular: Negative for chest pain.  Gastrointestinal: Negative for abdominal pain.  Endocrine: Negative for polyuria.  Genitourinary: Negative for dysuria.  Musculoskeletal: Negative for myalgias, neck pain and neck stiffness.  Skin: Negative for rash.  Neurological: Negative for headaches.      Allergies  Darvocet; Dayquil;  and Miconazole nitrate  Home Medications   Prior to Admission medications   Medication Sig Start Date End Date Taking? Authorizing Provider  aspirin-sod bicarb-citric acid (ALKA-SELTZER) 325 MG TBEF tablet Take 325 mg by mouth once.   Yes Historical Provider, MD  Multiple Vitamins-Minerals (MULTIVITAMIN WITH MINERALS) tablet Take 1 tablet by mouth daily.   Yes Historical Provider, MD  Pseudoeph-Doxylamine-DM-APAP (NYQUIL PO) Take 1 tablet by mouth once.   Yes Historical Provider, MD  Pseudoephedrine-APAP-DM (DAYQUIL PO) Take 1 tablet by mouth once.   Yes Historical Provider, MD   BP 112/57  Pulse 86  Temp(Src) 97.9 F (36.6 C) (Oral)  Resp 20  Ht 5\' 4"  (1.626 m)  Wt 160 lb (72.576 kg)  BMI 27.45 kg/m2  SpO2 100%  LMP 01/28/2014 Physical Exam  Nursing note and vitals reviewed. Constitutional: She is oriented to person, place, and time. She appears well-developed and well-nourished.  HENT:  Head: Normocephalic and atraumatic.  Mouth/Throat: Oropharynx is clear and moist.  Eyes: Conjunctivae are normal. Pupils are equal, round, and reactive to light. Right eye exhibits no discharge. Left eye exhibits no discharge. No scleral icterus.  Neck: Neck supple. No JVD present. No tracheal deviation present.  Cardiovascular: Normal rate, regular rhythm and normal heart sounds.   Pulmonary/Chest: Effort normal and breath sounds normal. No stridor. No respiratory distress. She has no wheezes. She has no rales.  Patient coughing during exam,but no evidence of respiratory distress  Abdominal: Soft. There is no tenderness.  Musculoskeletal: She exhibits no tenderness.  Neurological: She  is alert and oriented to person, place, and time.  Cranial Nerves II-XII grossly intact  Skin: Skin is warm and dry. No rash noted.  Psychiatric: She has a normal mood and affect.    ED Course  Procedures (including critical care time) Labs Review Labs Reviewed - No data to display  Imaging Review No  results found.   EKG Interpretation None      MDM  Vitals stable - WNL -afebrile, not tachypneic on my exam Pt resting comfortably in ED. PE shows no evidence of pneumonia or focal consolidation. No fevers, shortness of breath. No respiratory distress, no tonsillar exudates or erythema. No meningeal signs.  Given timing of symptom onset will treat symptomatically. Symptomology likely due to viral etiology  Will DC with Robitussin cough syrup, Benadryl and recommendation for decongestant OTC medication. Discussed f/u with PCP and return precautions, pt very amenable to plan.   Prior to patient discharge, I discussed and reviewed this case with Dr.Jacubowitz     Final diagnoses:  URI (upper respiratory infection)        Sharlene MottsBenjamin W Camyah Pultz, PA-C 02/11/14 0000

## 2014-04-08 ENCOUNTER — Encounter (HOSPITAL_COMMUNITY): Payer: Self-pay | Admitting: *Deleted

## 2014-04-08 ENCOUNTER — Emergency Department (HOSPITAL_COMMUNITY)
Admission: EM | Admit: 2014-04-08 | Discharge: 2014-04-08 | Disposition: A | Discharge status: Home / Self Care | Payer: Medicaid Other | Attending: Emergency Medicine | Admitting: Emergency Medicine

## 2014-04-08 DIAGNOSIS — Z720 Tobacco use: Secondary | ICD-10-CM | POA: Diagnosis not present

## 2014-04-08 DIAGNOSIS — Z8659 Personal history of other mental and behavioral disorders: Secondary | ICD-10-CM | POA: Insufficient documentation

## 2014-04-08 DIAGNOSIS — Z79899 Other long term (current) drug therapy: Secondary | ICD-10-CM | POA: Insufficient documentation

## 2014-04-08 DIAGNOSIS — G8929 Other chronic pain: Secondary | ICD-10-CM | POA: Diagnosis not present

## 2014-04-08 DIAGNOSIS — M5432 Sciatica, left side: Secondary | ICD-10-CM | POA: Insufficient documentation

## 2014-04-08 DIAGNOSIS — M5431 Sciatica, right side: Secondary | ICD-10-CM | POA: Insufficient documentation

## 2014-04-08 DIAGNOSIS — M545 Low back pain: Secondary | ICD-10-CM | POA: Diagnosis present

## 2014-04-08 DIAGNOSIS — Z7982 Long term (current) use of aspirin: Secondary | ICD-10-CM | POA: Insufficient documentation

## 2014-04-08 MED ORDER — PREDNISONE 20 MG PO TABS: 40.0000 mg | ORAL_TABLET | Freq: Every day | ORAL | Status: DC

## 2014-04-08 MED ORDER — PREDNISONE 20 MG PO TABS
60.0000 mg | ORAL_TABLET | Freq: Once | ORAL | Status: AC
  Administered 2014-04-08: 60 mg via ORAL
  Filled 2014-04-08: qty 3

## 2014-04-08 MED ORDER — OXYCODONE-ACETAMINOPHEN 5-325 MG PO TABS
1.0000 | ORAL_TABLET | Freq: Once | ORAL | Status: AC
  Administered 2014-04-08: 1 via ORAL
  Filled 2014-04-08: qty 1

## 2014-04-08 NOTE — Discharge Instructions (Signed)
Please follow the directions provided.  Be sure to establish care with the orthopedic doctor for further management of this back pain.  Take the prednisone as directed along with your robaxin.  Don't hesitate to return for any new, worsening or concerning symptoms.    SEEK IMMEDIATE MEDICAL CARE IF:  You have pain that radiates from your back into your legs.  You develop new bowel or bladder control problems.  You have unusual weakness or numbness in your arms or legs.  You develop nausea or vomiting.  You develop abdominal pain.  You feel faint.

## 2014-04-08 NOTE — ED Provider Notes (Signed)
CSN: [id number]     Arrival date & time 04/08/14  1929 History   First MD Initiated Contact with Patient 04/08/14 2159     Chief Complaint  Patient presents with  . Back Pain    (Consider location/radiation/quality/duration/timing/severity/associated sxs/prior Treatment) HPI Marissa Smith is a [age] female presenting with recurrent back pain x 2 days.  She has a history of back pain in the past and was seen by an orthopedic doctor and had been doing well until recently. She is unaware of any injury. She describes the pain as aching and rates as 10/10.  She denies IVDU, saddle parasthesias, bowel or bladder incontinence or radicular symptoms.    Past Medical History  Diagnosis Date  . Back pain, chronic   . Depressed bipolar I disorder    Past Surgical History  Procedure Laterality Date  . Cholecystectomy     No family history on file. History  Substance Use Topics  . Smoking status: Current Every Day Smoker -- 0.20 packs/day    Types: Cigarettes  . Smokeless tobacco: Not on file  . Alcohol Use: No   OB History    No data available     Review of Systems  Constitutional: Negative for fever and chills.  HENT: Negative for sore throat.   Eyes: Negative for visual disturbance.  Respiratory: Negative for cough and shortness of breath.   Cardiovascular: Negative for chest pain and leg swelling.  Gastrointestinal: Negative for nausea, vomiting and diarrhea.  Genitourinary: Negative for dysuria.  Musculoskeletal: Positive for myalgias and back pain.  Skin: Negative for rash.  Neurological: Negative for weakness, numbness and headaches.    Allergies  Darvocet; Dayquil; and Miconazole nitrate  Home Medications   Prior to Admission medications   Medication Sig Start Date End Date Taking? Authorizing Provider  aspirin-sod bicarb-citric acid (ALKA-SELTZER) 325 MG TBEF tablet Take 325 mg by mouth once.    Historical Provider, MD  benzonatate (TESSALON) 100 MG capsule Take 2  capsules (200 mg total) by mouth 2 (two) times daily as needed for cough. Patient not taking: Reported on 04/08/2014 02/10/14   Earle GellBenjamin W Cartner, PA-C  diphenhydrAMINE (BENADRYL) 25 MG tablet Take 1 tablet (25 mg total) by mouth every 6 (six) hours. Patient not taking: Reported on 04/08/2014 02/10/14   Earle GellBenjamin W Cartner, PA-C  guaifenesin (ROBITUSSIN) 100 MG/5ML syrup Take 5-10 mLs (100-200 mg total) by mouth every 4 (four) hours as needed for cough. Patient not taking: Reported on 04/08/2014 02/10/14   Earle GellBenjamin W Cartner, PA-C   BP 143/87 mmHg  Pulse 65  Temp(Src) 98.8 F (37.1 C) (Oral)  Resp 18  Ht 5\' 5"  (1.651 m)  Wt 160 lb (72.576 kg)  BMI 26.63 kg/m2  SpO2 100%  LMP 04/07/2014 Physical Exam  Constitutional: She appears well-developed and well-nourished. No distress.  HENT:  Head: Normocephalic and atraumatic.  Eyes: Conjunctivae are normal.  Neck: Neck supple.  Cardiovascular: Normal rate, regular rhythm and intact distal pulses.   Pulmonary/Chest: Effort normal and breath sounds normal. No respiratory distress.  Abdominal: Soft. There is no tenderness.  Musculoskeletal: She exhibits tenderness.       Lumbar back: She exhibits tenderness. She exhibits normal range of motion and no bony tenderness.       Back:  Lymphadenopathy:    She has no cervical adenopathy.  Neurological: She is alert.  Skin: Skin is warm and dry. No rash noted. She is not diaphoretic.  Psychiatric: She has a normal mood and  affect.  Nursing note and vitals reviewed.   ED Course  Procedures (including critical care time) Labs Review Labs Reviewed - No data to display  Imaging Review No results found.   EKG Interpretation None      MDM   Final diagnoses:  Bilateral low back pain, with sciatica presence unspecified   28 with recurrent back pain without history of injury. She has no report of  neurological deficits and normal neuro exam.  Patient can walk but states is painful.  No  loss of bowel or bladder control.  No concern for cauda equina.  No fever, night sweats, weight loss, h/o cancer, IVDU.  RICE protocol and short course of prednisone prescribed and discussed with patient.  Pain meds in the ED. Referral provided for ortho if symptoms persist. Pt is well-appearing, in no acute distress and vital signs are stable.  They appear safe to be discharged.   Return precautions provided.     Filed Vitals:   04/08/14 1952  BP: 143/87  Pulse: 65  Temp: 98.8 F (37.1 C)  TempSrc: Oral  Resp: 18  Height: 5\' 5"  (1.651 m)  Weight: 160 lb (72.576 kg)  SpO2: 100%   Meds given in ED:  Medications  predniSONE (DELTASONE) tablet 60 mg (60 mg Oral Given 04/08/14 2259)  oxyCODONE-acetaminophen (PERCOCET/ROXICET) 5-325 MG per tablet 1 tablet (1 tablet Oral Given 04/08/14 2257)    Discharge Medication List as of 04/08/2014 10:37 PM    START taking these medications   Details  predniSONE (DELTASONE) 20 MG tablet Take 2 tablets (40 mg total) by mouth daily., Starting 04/08/2014, Until Discontinued, Print            Harle BattiestElizabeth Rhianna Raulerson, NP 04/09/14 1736  Juliet RudeNathan R. Rubin PayorPickering, MD 04/11/14 2106

## 2014-04-08 NOTE — ED Notes (Signed)
The pt has chronic back pain and fior the past 2 days her pain has been worse.    lmp 1 day ago

## 2014-04-09 ENCOUNTER — Encounter: Payer: Self-pay | Admitting: *Deleted

## 2014-04-10 ENCOUNTER — Encounter: Payer: Self-pay | Admitting: Obstetrics & Gynecology

## 2014-05-31 ENCOUNTER — Encounter (HOSPITAL_COMMUNITY): Payer: Self-pay | Admitting: *Deleted

## 2014-05-31 ENCOUNTER — Emergency Department (HOSPITAL_COMMUNITY)
Admission: EM | Admit: 2014-05-31 | Discharge: 2014-05-31 | Disposition: A | Discharge status: Home / Self Care | Payer: Medicaid Other | Source: Home / Self Care | Attending: Family Medicine | Admitting: Family Medicine

## 2014-05-31 DIAGNOSIS — T782XXA Anaphylactic shock, unspecified, initial encounter: Secondary | ICD-10-CM

## 2014-05-31 HISTORY — DX: Allergic rhinitis due to animal (cat) (dog) hair and dander: J30.81

## 2014-05-31 HISTORY — DX: Other seasonal allergic rhinitis: J30.2

## 2014-05-31 MED ORDER — METHYLPREDNISOLONE SODIUM SUCC 125 MG IJ SOLR
INTRAMUSCULAR | Status: AC
Start: 2014-05-31 — End: 2014-05-31
  Filled 2014-05-31: qty 2

## 2014-05-31 MED ORDER — EPINEPHRINE 0.3 MG/0.3ML IJ SOAJ: 0.3000 mg | Freq: Once | INTRAMUSCULAR | Status: DC

## 2014-05-31 MED ORDER — METHYLPREDNISOLONE SODIUM SUCC 125 MG IJ SOLR: 125.0000 mg | Freq: Once | INTRAMUSCULAR | Status: DC

## 2014-05-31 MED ORDER — DIPHENHYDRAMINE HCL 50 MG/ML IJ SOLN
INTRAMUSCULAR | Status: AC
  Filled 2014-05-31: qty 1

## 2014-05-31 MED ORDER — DIPHENHYDRAMINE HCL 50 MG/ML IJ SOLN
50.0000 mg | Freq: Once | INTRAMUSCULAR | Status: AC
  Administered 2014-05-31: 50 mg via INTRAMUSCULAR

## 2014-05-31 MED ORDER — METHYLPREDNISOLONE SODIUM SUCC 125 MG IJ SOLR
125.0000 mg | Freq: Once | INTRAMUSCULAR | Status: AC
  Administered 2014-05-31: 125 mg via INTRAMUSCULAR

## 2014-05-31 MED ORDER — EPINEPHRINE HCL 1 MG/ML IJ SOLN
INTRAMUSCULAR | Status: AC
Start: 2014-05-31 — End: 2014-05-31
  Filled 2014-05-31: qty 1

## 2014-05-31 MED ORDER — EPINEPHRINE 0.3 MG/0.3ML IJ SOAJ
0.3000 mg | Freq: Once | INTRAMUSCULAR | Status: AC
  Administered 2014-05-31: 0.3 mg via SUBCUTANEOUS

## 2014-05-31 NOTE — ED Provider Notes (Signed)
Marissa Smith is a 29 y.o. female who presents to Urgent Care today for Anaphylaxis. Patient began experiencing body itching today at about 11:30 in the morning. Her symptoms became dramatically worse about an hour ago. She has whole body itching now including facial redness and itching as well as subjective tongue swelling and itchy scratchy throat and now cough. She additionally notes new onset of chest tightness. She denies any fevers or chills vomiting or diarrhea. She took some Benadryl earlier which did not help. In the past she's had similar reactions it required injectable epinephrine.   Past Medical History  Diagnosis Date  . Back pain, chronic   . Depressed bipolar I disorder   . Cat allergies   . Seasonal allergies    Past Surgical History  Procedure Laterality Date  . Cholecystectomy  2010   History  Substance Use Topics  . Smoking status: Current Every Day Smoker -- 0.25 packs/day    Types: Cigarettes  . Smokeless tobacco: Not on file  . Alcohol Use: No   ROS as above Medications: No current facility-administered medications for this encounter.   Current Outpatient Prescriptions  Medication Sig Dispense Refill  . EPINEPHrine 0.3 mg/0.3 mL IJ SOAJ injection Inject 0.3 mLs (0.3 mg total) into the muscle once. 1 Device 1   Allergies  Allergen Reactions  . Darvocet [Propoxyphene N-Acetaminophen] Nausea Only and Other (See Comments)    dizziness  . Miconazole Nitrate Swelling and Rash    Cream caused swelling and rash     Exam:  BP 99/47 mmHg  Pulse 65  Temp(Src) 98.6 F (37 C)  Resp 24  SpO2 100%  LMP 05/08/2014 Gen: Well NAD HEENT: EOMI,  MMM no tongue or lip swelling visible Lungs: Normal work of breathing. CTABL Heart: RRR no MRG Abd: NABS, Soft. Nondistended, Nontender Exts: Brisk capillary refill, warm and well perfused.  Skin: erythema and urticaria present throughout  Patient was given 0.3 mg subcutaneous epinephrine, and 125 mg IM Medrol, and 50  mg IM Benadryl.   After 45 minutes of observation the patient felt much better and no longer had any significant rash  No results found for this or any previous visit (from the past 24 hour(s)). No results found.  Assessment and Plan: 29 y.o. female with anaphylaxis. Doing very well. Patient appears to be stable. I recommend transfer to emergency room for further observation however patient declines. Discussed risks and benefits. Plan to discharge with an EpiPen prescription. Follow-up with PCP.  Discussed warning signs or symptoms. Please see discharge instructions. Patient expresses understanding.     Rodolph BongEvan S Brelan Hannen, MD 05/31/14 [phone]

## 2014-05-31 NOTE — ED Notes (Addendum)
Eyes felt itchy, like sand was in her eyes when she woke up this AM. C/o itching onset 1100.  States rash, breathing problem and worsening of itching onset 1400.  States cat ran in the house @ [phone] and jumped on her. She picked her up and put her back outside-then did not wash her hands because she was going to work. Redness to face and neck down to upper ant chest.  Swelling around her eyes. Voice sounds hoarse.

## 2014-05-31 NOTE — Discharge Instructions (Signed)
Thank you for coming in today. I recommend that you go to the emergency room. Return as needed. Go to the emergency room if you get worse.    Anaphylactic Reaction An anaphylactic reaction is a sudden, severe allergic reaction that involves the whole body. It can be life threatening. A hospital stay is often required. People with asthma, eczema, or hay fever are slightly more likely to have an anaphylactic reaction. CAUSES  An anaphylactic reaction Raney be caused by anything to which you are allergic. After being exposed to the allergic substance, your immune system becomes sensitized to it. When you are exposed to that allergic substance again, an allergic reaction can occur. Common causes of an anaphylactic reaction include:  Medicines.  Foods, especially peanuts, wheat, shellfish, milk, and eggs.  Insect bites or stings.  Blood products.  Chemicals, such as dyes, latex, and contrast material used for imaging tests. SYMPTOMS  When an allergic reaction occurs, the body releases histamine and other substances. These substances cause symptoms such as tightening of the airway. Symptoms often develop within seconds or minutes of exposure. Symptoms Bohan include:  Skin rash or hives.  Itching.  Chest tightness.  Swelling of the eyes, tongue, or lips.  Trouble breathing or swallowing.  Lightheadedness or fainting.  Anxiety or confusion.  Stomach pains, vomiting, or diarrhea.  Nasal congestion.  A fast or irregular heartbeat (palpitations). DIAGNOSIS  Diagnosis is based on your history of recent exposure to allergic substances, your symptoms, and a physical exam. Your caregiver Meche also perform blood or urine tests to confirm the diagnosis. TREATMENT  Epinephrine medicine is the main treatment for an anaphylactic reaction. Other medicines that Maney be used for treatment include antihistamines, steroids, and albuterol. In severe cases, fluids and medicine to support blood pressure  Threat be given through an intravenous line (IV). Even if you improve after treatment, you need to be observed to make sure your condition does not get worse. This Lisenby require a stay in the hospital. Whiting a medical alert bracelet or necklace stating your allergy.  You and your family must learn how to use an anaphylaxis kit or give an epinephrine injection to temporarily treat an emergency allergic reaction. Always carry your epinephrine injection or anaphylaxis kit with you. This can be lifesaving if you have a severe reaction.  Do not drive or perform tasks after treatment until the medicines used to treat your reaction have worn off, or until your caregiver says it is okay.  If you have hives or a rash:  Take medicines as directed by your caregiver.  You Kawamoto use an over-the-counter antihistamine (diphenhydramine) as needed.  Apply cold compresses to the skin or take baths in cool water. Avoid hot baths or showers. SEEK MEDICAL CARE IF:   You develop symptoms of an allergic reaction to a new substance. Symptoms Schoenberger start right away or minutes later.  You develop a rash, hives, or itching.  You develop new symptoms. SEEK IMMEDIATE MEDICAL CARE IF:   You have swelling of the mouth, difficulty breathing, or wheezing.  You have a tight feeling in your chest or throat.  You develop hives, swelling, or itching all over your body.  You develop severe vomiting or diarrhea.  You feel faint or pass out. This is an emergency. Use your epinephrine injection or anaphylaxis kit as you have been instructed. Call your local emergency services (911 in [place] Even if you improve after the injection, you need to  be examined at a hospital emergency department. MAKE SURE YOU:   Understand these instructions.  Will watch your condition.  Will get help right away if you are not doing well or get worse. Document Released: 03/30/2005 Document Revised: 04/04/2013 Document  Reviewed: 07/01/2011 [facility] Patient Information 2015 [place], [place]. This information is not intended to replace advice given to you by your health care provider. Make sure you discuss any questions you have with your health care provider.

## 2014-05-31 NOTE — ED Notes (Signed)
States she is breathing easier, does not itch anymore, and face and neck is not burning anymore.  Neck still appears bright pink.

## 2014-07-09 ENCOUNTER — Emergency Department (HOSPITAL_COMMUNITY)
Admission: EM | Admit: 2014-07-09 | Discharge: 2014-07-09 | Disposition: A | Discharge status: Home / Self Care | Payer: Medicaid Other | Source: Home / Self Care | Attending: Emergency Medicine | Admitting: Emergency Medicine

## 2014-07-09 ENCOUNTER — Encounter (HOSPITAL_COMMUNITY): Payer: Self-pay | Admitting: *Deleted

## 2014-07-09 DIAGNOSIS — M545 Low back pain, unspecified: Secondary | ICD-10-CM

## 2014-07-09 MED ORDER — HYDROCODONE-ACETAMINOPHEN 5-325 MG PO TABS
2.0000 | ORAL_TABLET | ORAL | Status: DC | PRN
Start: 2014-07-09 — End: 2014-10-16

## 2014-07-09 NOTE — ED Provider Notes (Signed)
CSN: [id number]     Arrival date & time 07/09/14  1017 History   First MD Initiated Contact with Patient 07/09/14 1225     Chief Complaint  Patient presents with  . Back Pain   (Consider location/radiation/quality/duration/timing/severity/associated sxs/prior Treatment) Patient is a 29 y.o. female presenting with back pain. The history is provided by the patient. No language interpreter was used.  Back Pain Location:  Lumbar spine Quality:  Aching Radiates to:  Does not radiate Pain severity:  Moderate Onset quality:  Gradual Timing:  Constant Progression:  Worsening Context: emotional stress   Relieved by:  Nothing Worsened by:  Nothing tried Ineffective treatments:  None tried Associated symptoms: no fever and no headaches   Risk factors: no menopause     Past Medical History  Diagnosis Date  . Back pain, chronic   . Depressed bipolar I disorder   . Cat allergies   . Seasonal allergies    Past Surgical History  Procedure Laterality Date  . Cholecystectomy  2010   Family History  Problem Relation Age of Onset  . Heart failure Father    History  Substance Use Topics  . Smoking status: Current Every Day Smoker -- 0.25 packs/day    Types: Cigarettes  . Smokeless tobacco: Not on file  . Alcohol Use: No   OB History    No data available     Review of Systems  Constitutional: Negative for fever.  Musculoskeletal: Positive for back pain.  Neurological: Negative for headaches.  All other systems reviewed and are negative.   Allergies  Darvocet and Miconazole nitrate  Home Medications   Prior to Admission medications   Medication Sig Start Date End Date Taking? Authorizing Provider  EPINEPHrine 0.3 mg/0.3 mL IJ SOAJ injection Inject 0.3 mLs (0.3 mg total) into the muscle once. 05/31/14   Rodolph BongEvan S Corey, MD   BP 108/61 mmHg  Pulse 56  Temp(Src) 98.2 F (36.8 C) (Oral)  Resp 20  SpO2 99%  LMP 06/14/2014 Physical Exam  Constitutional: She is oriented to  person, place, and time. She appears well-nourished.  HENT:  Head: Normocephalic and atraumatic.  Eyes: EOM are normal.  Neck: Normal range of motion.  Cardiovascular: Normal rate.   Pulmonary/Chest: Effort normal.  Abdominal: She exhibits no distension.  Musculoskeletal: Normal range of motion.  Neurological: She is alert and oriented to person, place, and time.  Skin: Skin is warm.  Psychiatric: She has a normal mood and affect.  Nursing note and vitals reviewed.   ED Course  Procedures (including critical care time) Labs Review Labs Reviewed - No data to display  Imaging Review No results found.   MDM   1. Bilateral low back pain without sciatica    Meds ordered this encounter  Medications  . HYDROcodone-acetaminophen (NORCO/VICODIN) 5-325 MG per tablet    Sig: Take 2 tablets by mouth every 4 (four) hours as needed.    Dispense:  16 tablet    Refill:  0    Order Specific Question:  Supervising Provider    Answer:  Micheline ChapmanHONIG, ERIN J [[id number]]     Elson AreasLeslie K Lauraann Missey, PA-C 07/09/14 1900

## 2014-07-09 NOTE — Discharge Instructions (Signed)
Back Pain, Adult Low back pain is very common. About 1 in 5 people have back pain.The cause of low back pain is rarely dangerous. The pain often gets better over time.About half of people with a sudden onset of back pain feel better in just 2 weeks. About 8 in 10 people feel better by 6 weeks.  CAUSES Some common causes of back pain include:  Strain of the muscles or ligaments supporting the spine.  Wear and tear (degeneration) of the spinal discs.  Arthritis.  Direct injury to the back. DIAGNOSIS Most of the time, the direct cause of low back pain is not known.However, back pain can be treated effectively even when the exact cause of the pain is unknown.Answering your caregiver's questions about your overall health and symptoms is one of the most accurate ways to make sure the cause of your pain is not dangerous. If your caregiver needs more information, he or she Fenstermaker order lab work or imaging tests (X-rays or MRIs).However, even if imaging tests show changes in your back, this usually does not require surgery. HOME CARE INSTRUCTIONS For many people, back pain returns.Since low back pain is rarely dangerous, it is often a condition that people can learn to manageon their own.   Remain active. It is stressful on the back to sit or stand in one place. Do not sit, drive, or stand in one place for more than 30 minutes at a time. Take short walks on level surfaces as soon as pain allows.Try to increase the length of time you walk each day.  Do not stay in bed.Resting more than 1 or 2 days can delay your recovery.  Do not avoid exercise or work.Your body is made to move.It is not dangerous to be active, even though your back Ernandez hurt.Your back will likely heal faster if you return to being active before your pain is gone.  Pay attention to your body when you bend and lift. Many people have less discomfortwhen lifting if they bend their knees, keep the load close to their bodies,and  avoid twisting. Often, the most comfortable positions are those that put less stress on your recovering back.  Find a comfortable position to sleep. Use a firm mattress and lie on your side with your knees slightly bent. If you lie on your back, put a pillow under your knees.  Only take over-the-counter or prescription medicines as directed by your caregiver. Over-the-counter medicines to reduce pain and inflammation are often the most helpful.Your caregiver Kingsford prescribe muscle relaxant drugs.These medicines help dull your pain so you can more quickly return to your normal activities and healthy exercise.  Put ice on the injured area.  Put ice in a plastic bag.  Place a towel between your skin and the bag.  Leave the ice on for 15-20 minutes, 03-04 times a day for the first 2 to 3 days. After that, ice and heat Wiesman be alternated to reduce pain and spasms.  Ask your caregiver about trying back exercises and gentle massage. This Bilyeu be of some benefit.  Avoid feeling anxious or stressed.Stress increases muscle tension and can worsen back pain.It is important to recognize when you are anxious or stressed and learn ways to manage it.Exercise is a great option. SEEK MEDICAL CARE IF:  You have pain that is not relieved with rest or medicine.  You have pain that does not improve in 1 week.  You have new symptoms.  You are generally not feeling well. SEEK   IMMEDIATE MEDICAL CARE IF:   You have pain that radiates from your back into your legs.  You develop new bowel or bladder control problems.  You have unusual weakness or numbness in your arms or legs.  You develop nausea or vomiting.  You develop abdominal pain.  You feel faint. Document Released: 03/30/2005 Document Revised: 09/29/2011 Document Reviewed: 08/01/2013 [employer] Patient Information 2015 [employer]. This information is not intended to replace advice given to you by your health care provider. Make sure you  discuss any questions you have with your health care provider.  

## 2014-07-09 NOTE — ED Notes (Signed)
Pt reports     Low  Back  Pain  He  Reports    She  Twisted her  Back      yest  At  Work   And  Developed back  Pain  With the  Pain  Radiating  Down the  Legs         She  Is  Sitting upright on the  Exam table  Speaking  In  Complete  sentances

## 2014-09-24 ENCOUNTER — Encounter (HOSPITAL_COMMUNITY): Payer: Self-pay | Admitting: Emergency Medicine

## 2014-09-24 ENCOUNTER — Emergency Department (HOSPITAL_COMMUNITY)
Admission: EM | Admit: 2014-09-24 | Discharge: 2014-09-24 | Disposition: A | Discharge status: Home / Self Care | Payer: Medicaid Other | Source: Home / Self Care | Attending: Family Medicine | Admitting: Family Medicine

## 2014-09-24 ENCOUNTER — Emergency Department (INDEPENDENT_AMBULATORY_CARE_PROVIDER_SITE_OTHER): Payer: Medicaid Other

## 2014-09-24 DIAGNOSIS — S338XXA Sprain of other parts of lumbar spine and pelvis, initial encounter: Secondary | ICD-10-CM

## 2014-09-24 DIAGNOSIS — S39012A Strain of muscle, fascia and tendon of lower back, initial encounter: Secondary | ICD-10-CM

## 2014-09-24 MED ORDER — CYCLOBENZAPRINE HCL 5 MG PO TABS: 5.0000 mg | ORAL_TABLET | Freq: Every evening | ORAL | Status: DC | PRN

## 2014-09-24 MED ORDER — ETODOLAC 400 MG PO TABS: 400.0000 mg | ORAL_TABLET | Freq: Two times a day (BID) | ORAL | Status: DC

## 2014-09-24 NOTE — Discharge Instructions (Signed)
Thank you for coming in today. Come back or go to the emergency room if you notice new weakness new numbness problems walking or bowel or bladder problems.  Lumbosacral Strain Lumbosacral strain is a strain of any of the parts that make up your lumbosacral vertebrae. Your lumbosacral vertebrae are the bones that make up the lower third of your backbone. Your lumbosacral vertebrae are held together by muscles and tough, fibrous tissue (ligaments).  CAUSES  A sudden blow to your back can cause lumbosacral strain. Also, anything that causes an excessive stretch of the muscles in the low back can cause this strain. This is typically seen when people exert themselves strenuously, fall, lift heavy objects, bend, or crouch repeatedly. RISK FACTORS  Physically demanding work.  Participation in pushing or pulling sports or sports that require a sudden twist of the back (tennis, golf, baseball).  Weight lifting.  Excessive lower back curvature.  Forward-tilted pelvis.  Weak back or abdominal muscles or both.  Tight hamstrings. SIGNS AND SYMPTOMS  Lumbosacral strain Massie cause pain in the area of your injury or pain that moves (radiates) down your leg.  DIAGNOSIS Your health care provider can often diagnose lumbosacral strain through a physical exam. In some cases, you Beshears need tests such as X-ray exams.  TREATMENT  Treatment for your lower back injury depends on many factors that your clinician will have to evaluate. However, most treatment will include the use of anti-inflammatory medicines. HOME CARE INSTRUCTIONS   Avoid hard physical activities (tennis, racquetball, waterskiing) if you are not in proper physical condition for it. This Vanasten aggravate or create problems.  If you have a back problem, avoid sports requiring sudden body movements. Swimming and walking are generally safer activities.  Maintain good posture.  Maintain a healthy weight.  For acute conditions, you Fleeman put ice on  the injured area.  Put ice in a plastic bag.  Place a towel between your skin and the bag.  Leave the ice on for 20 minutes, 2-3 times a day.  When the low back starts healing, stretching and strengthening exercises Mesch be recommended. SEEK MEDICAL CARE IF:  Your back pain is getting worse.  You experience severe back pain not relieved with medicines. SEEK IMMEDIATE MEDICAL CARE IF:   You have numbness, tingling, weakness, or problems with the use of your arms or legs.  There is a change in bowel or bladder control.  You have increasing pain in any area of the body, including your belly (abdomen).  You notice shortness of breath, dizziness, or feel faint.  You feel sick to your stomach (nauseous), are throwing up (vomiting), or become sweaty.  You notice discoloration of your toes or legs, or your feet get very cold. MAKE SURE YOU:   Understand these instructions.  Will watch your condition.  Will get help right away if you are not doing well or get worse. Document Released: 01/07/2005 Document Revised: 04/04/2013 Document Reviewed: 11/16/2012 [employer] Patient Information 2015 [employer]. This information is not intended to replace advice given to you by your health care provider. Make sure you discuss any questions you have with your health care provider.  
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
## 2014-10-16 ENCOUNTER — Encounter: Payer: Self-pay | Admitting: Obstetrics

## 2014-10-16 ENCOUNTER — Ambulatory Visit (INDEPENDENT_AMBULATORY_CARE_PROVIDER_SITE_OTHER): Payer: Medicaid Other | Admitting: Obstetrics

## 2014-10-16 VITALS — BP 125/71 | HR 78 | Temp 98.0°F | Ht 65.0 in | Wt 160.0 lb

## 2014-10-16 DIAGNOSIS — Z124 Encounter for screening for malignant neoplasm of cervix: Secondary | ICD-10-CM | POA: Diagnosis not present

## 2014-10-16 DIAGNOSIS — Z30014 Encounter for initial prescription of intrauterine contraceptive device: Secondary | ICD-10-CM | POA: Diagnosis not present

## 2014-10-16 DIAGNOSIS — N946 Dysmenorrhea, unspecified: Secondary | ICD-10-CM | POA: Diagnosis not present

## 2014-10-16 DIAGNOSIS — N943 Premenstrual tension syndrome: Secondary | ICD-10-CM | POA: Diagnosis not present

## 2014-10-16 DIAGNOSIS — Z0000 Encounter for general adult medical examination without abnormal findings: Secondary | ICD-10-CM | POA: Diagnosis not present

## 2014-10-16 DIAGNOSIS — Z01419 Encounter for gynecological examination (general) (routine) without abnormal findings: Secondary | ICD-10-CM

## 2014-10-16 MED ORDER — KETOROLAC TROMETHAMINE 30 MG/ML IJ SOLN
60.0000 mg | Freq: Once | INTRAMUSCULAR | Status: AC
  Administered 2014-10-16: 60 mg via INTRAMUSCULAR

## 2014-10-16 MED ORDER — VITAFOL FE+ 90-1-200 & 50 MG PO CPPK: 1.0000 | ORAL_CAPSULE | Freq: Every day | ORAL | Status: DC

## 2014-10-16 MED ORDER — PAROXETINE HCL 20 MG PO TABS: 20.0000 mg | ORAL_TABLET | Freq: Every day | ORAL | Status: DC

## 2014-10-16 MED ORDER — OXYCODONE HCL 10 MG PO TABS: 10.0000 mg | ORAL_TABLET | Freq: Four times a day (QID) | ORAL | Status: DC | PRN

## 2014-10-16 NOTE — Patient Instructions (Signed)
Premenstrual Syndrome Premenstrual syndrome (PMS) is a condition that consists of physical, emotional, and behavioral symptoms that affect women of childbearing age. PMS occurs 5-14 days before the start of a menstrual period and often recurs in a predictable pattern. The symptoms go away a few days after the menstrual period starts. PMS can interfere in many ways with normal daily activities and can range from mild to severe. When PMS is considered severe, it Hornik be diagnosed as premenstrual dysphoric disorder (PMDD). A small percentage of women are affected by PMS symptoms and an even smaller percentage of those women are affected by PMDD.  CAUSES  The exact cause of PMS is unknown, but it seems to be related to cyclic hormone changes that happen before menstruation. These hormones are thought to affect chemicals in the brain (serotonin) that can influence a person's mood.  SYMPTOMS  Symptoms of PMS recur consistently from month to month and go away completely after the menstrual period starts. The most common emotional or behavioral symptom is mood swings. These mood swings can be disabling and interfere with normal activities of daily living. Other common symptoms include depression and angry outbursts. Other symptoms Ishler include:   Irritability.  Anxiety.  Crying spells.   Food cravings or appetite changes.   Changes in sexual desire.   Confusion.   Aggression.   Social withdrawal.   Poor concentration. The most common physical symptoms include a sense of bloating, breast pain, headaches, and extreme fatigue. Other physical symptoms include:   Backaches.   Swelling of the hands and feet.   Weight gain.   Hot flashes.  DIAGNOSIS  To make a diagnosis, your caregiver will ask questions to confirm that you are having a pattern of symptoms. Symptoms must:   Be present 5 days before the start of your period and be present at least 3 months in a row.   End within 4 days  after your period starts.   Interfere with some of your normal activities.  Other conditions, such as thyroid disease, depression, and migraine headaches must be ruled out before a diagnosis of PMS is confirmed.  TREATMENT  Your caregiver Mungo suggest ways to maintain a healthy lifestyle, such as exercise. Over-the-counter pain relievers Blower ease cramps, aches, pains, headaches, and breast tenderness. However, selective serotonin reuptake inhibitors (SSRIs) are medicines that are most beneficial in improving PMS if taken in the second half of the monthly cycle. They Janus be taken on a daily basis. The most effective oral contraceptive pill used for symptoms of PMS is one that contains the ingredient drospirenone. Taking 4 days off of the pill instead of the usual 7 days also has shown to increase effectiveness.  There are a number of drugs, dietary supplements, vitamins, and water pills (diuretics) which have been suggested to be helpful but have not shown to be of any benefit to improving PMS symptoms.  HOME CARE INSTRUCTIONS   For 2-3 months, write down your symptoms, their severity, and how long they last. This Thornell help your caregiver prescribe the best treatment for your symptoms.  Exercise regularly as suggested by your caregiver.  Eat a regular, well-balanced diet.  Avoid caffeine, alcohol, and tobacco consumption.  Limit salt and salty foods to lessen bloating and fluid retention.  Get enough sleep. Practice relaxation techniques.  Drink enough fluids to keep your urine clear or pale yellow.  Take medicines as directed by your caregiver.  Limit stress.  Take a multivitamin as directed by your   caregiver. Document Released: 03/27/2000 Document Revised: 12/23/2011 Document Reviewed: 08/17/2011 Exit[employer] Patient Information 2015 [employer]. This information is not intended to replace advice given to you by your health care provider. Make sure you discuss any questions you have  with your health care provider.  

## 2014-10-17 ENCOUNTER — Encounter: Payer: Self-pay | Admitting: Obstetrics

## 2014-10-17 ENCOUNTER — Telehealth: Payer: Self-pay | Admitting: *Deleted

## 2014-10-17 LAB — PAP IG AND HPV HIGH-RISK: HPV DNA High Risk: DETECTED — AB

## 2014-10-17 NOTE — Telephone Encounter (Signed)
Patient is interested in the Mirena IUD for contraception. Patient states she is not currently sexually active. Patient has been scheduled for 10-25-14 @ 9:00 am.

## 2014-10-17 NOTE — Progress Notes (Signed)
Subjective:        Marissa Smith is a 29 y.o. female here for a routine exam.  Current complaints: none.    Personal health questionnaire:  Is patient Ashkenazi Jewish, have a family history of breast and/or ovarian cancer: no Is there a family history of uterine cancer diagnosed at age < 150, gastrointestinal cancer, urinary tract cancer, family member who is a Personnel officerLynch syndrome-associated carrier: no Is the patient overweight and hypertensive, family history of diabetes, personal history of gestational diabetes, preeclampsia or PCOS: no Is patient over 1055, have PCOS,  family history of premature CHD under age 10365, diabetes, smoke, have hypertension or peripheral artery disease:  no At any time, has a partner hit, kicked or otherwise hurt or frightened you?: no Over the past 2 weeks, have you felt down, depressed or hopeless?: no Over the past 2 weeks, have you felt little interest or pleasure in doing things?:no   Gynecologic History Patient's last menstrual period was 09/21/2014 (approximate). Contraception: abstinence Last Pap: 2015. Results were: normal Last mammogram: n/a. Results were: n/a  Obstetric History OB History  No data available    Past Medical History  Diagnosis Date  . Back pain, chronic   . Depressed bipolar I disorder   . Cat allergies   . Seasonal allergies     Past Surgical History  Procedure Laterality Date  . Cholecystectomy  2010     Current outpatient prescriptions:  .  EPINEPHrine 0.3 mg/0.3 mL IJ SOAJ injection, Inject 0.3 mLs (0.3 mg total) into the muscle once. (Patient not taking: Reported on 10/16/2014), Disp: 1 Device, Rfl: 1 .  Oxycodone HCl 10 MG TABS, Take 1 tablet (10 mg total) by mouth every 6 (six) hours as needed., Disp: 40 tablet, Rfl: 0 .  PARoxetine (PAXIL) 20 MG tablet, Take 1 tablet (20 mg total) by mouth daily., Disp: 30 tablet, Rfl: 11 .  Prenat-FePoly-Metf-FA-DHA-DSS (VITAFOL FE+) 90-1-200 & 50 MG CPPK, Take 1 capsule by mouth  daily before breakfast., Disp: 30 each, Rfl: 11 Allergies  Allergen Reactions  . Darvocet [Propoxyphene N-Acetaminophen] Nausea Only and Other (See Comments)    dizziness  . Miconazole Nitrate Swelling and Rash    Cream caused swelling and rash    History  Substance Use Topics  . Smoking status: Current Every Day Smoker -- 0.25 packs/day    Types: Cigarettes  . Smokeless tobacco: Never Used  . Alcohol Use: Yes    Family History  Problem Relation Age of Onset  . Heart failure Father       Review of Systems  Constitutional: negative for fatigue and weight loss Respiratory: negative for cough and wheezing Cardiovascular: negative for chest pain, fatigue and palpitations Gastrointestinal: negative for abdominal pain and change in bowel habits Musculoskeletal:negative for myalgias Neurological: negative for gait problems and tremors Behavioral/Psych: negative for abusive relationship.  Positive for depression Endocrine: negative for temperature intolerance   Genitourinary:negative for abnormal menstrual periods, genital lesions, hot flashes, sexual problems and vaginal discharge Integument/breast: negative for breast lump, breast tenderness, nipple discharge and skin lesion(s)    Objective:       BP 125/71 mmHg  Pulse 78  Temp(Src) 98 F (36.7 C)  Ht 5\' 5"  (1.651 m)  Wt 160 lb (72.576 kg)  BMI 26.63 kg/m2  LMP 09/21/2014 (Approximate) General:   alert  Skin:   no rash or abnormalities  Lungs:   clear to auscultation bilaterally  Heart:   regular rate and rhythm, S1,  S2 normal, no murmur, click, rub or gallop  Breasts:   normal without suspicious masses, skin or nipple changes or axillary nodes  Abdomen:  normal findings: no organomegaly, soft, non-tender and no hernia  Pelvis:  External genitalia: normal general appearance Urinary system: urethral meatus normal and bladder without fullness, nontender Vaginal: normal without tenderness, induration or masses Cervix:  normal appearance Adnexa: normal bimanual exam Uterus: anteverted and non-tender, normal size   Lab Review Urine pregnancy test Labs reviewed yes Radiologic studies reviewed no    Assessment:    Healthy female exam.    PMS  Dysmenorrhea  Contraceptive management   Plan:    Education reviewed: low fat, low cholesterol diet, safe sex/STD prevention, self breast exams and weight bearing exercise. Contraception: Considering options. Follow up in: several months.   Meds ordered this encounter  Medications  . PARoxetine (PAXIL) 20 MG tablet    Sig: Take 1 tablet (20 mg total) by mouth daily.    Dispense:  30 tablet    Refill:  11  . Oxycodone HCl 10 MG TABS    Sig: Take 1 tablet (10 mg total) by mouth every 6 (six) hours as needed.    Dispense:  40 tablet    Refill:  0  . Prenat-FePoly-Metf-FA-DHA-DSS (VITAFOL FE+) 90-1-200 & 50 MG CPPK    Sig: Take 1 capsule by mouth daily before breakfast.    Dispense:  30 each    Refill:  11  . ketorolac (TORADOL) 30 MG/ML injection 60 mg    Sig:    Orders Placed This Encounter  Procedures  . SureSwab, Vaginosis/Vaginitis Plus

## 2014-10-19 ENCOUNTER — Other Ambulatory Visit: Payer: Self-pay | Admitting: Obstetrics

## 2014-10-19 DIAGNOSIS — N760 Acute vaginitis: Secondary | ICD-10-CM

## 2014-10-19 DIAGNOSIS — B9689 Other specified bacterial agents as the cause of diseases classified elsewhere: Secondary | ICD-10-CM

## 2014-10-19 LAB — SURESWAB, VAGINOSIS/VAGINITIS PLUS
Atopobium vaginae: 7.9 Log (cells/mL)
C. albicans, DNA: NOT DETECTED
C. glabrata, DNA: NOT DETECTED
C. parapsilosis, DNA: NOT DETECTED
C. trachomatis RNA, TMA: NOT DETECTED
C. tropicalis, DNA: NOT DETECTED
Gardnerella vaginalis: 7.6 Log (cells/mL)
LACTOBACILLUS SPECIES: NOT DETECTED Log (cells/mL)
MEGASPHAERA SPECIES: >8 Log (cells/mL)
N. gonorrhoeae RNA, TMA: NOT DETECTED
T. vaginalis RNA, QL TMA: NOT DETECTED

## 2014-10-19 MED ORDER — TINIDAZOLE 500 MG PO TABS: 1000.0000 mg | ORAL_TABLET | Freq: Every day | ORAL | Status: DC

## 2014-10-23 ENCOUNTER — Ambulatory Visit: Payer: Self-pay | Admitting: Obstetrics

## 2014-11-16 ENCOUNTER — Emergency Department (HOSPITAL_COMMUNITY)
Admission: EM | Admit: 2014-11-16 | Discharge: 2014-11-16 | Disposition: A | Discharge status: Home / Self Care | Payer: Medicaid Other | Attending: Emergency Medicine | Admitting: Emergency Medicine

## 2014-11-16 ENCOUNTER — Encounter (HOSPITAL_COMMUNITY): Payer: Self-pay | Admitting: *Deleted

## 2014-11-16 DIAGNOSIS — Z8709 Personal history of other diseases of the respiratory system: Secondary | ICD-10-CM | POA: Diagnosis not present

## 2014-11-16 DIAGNOSIS — F319 Bipolar disorder, unspecified: Secondary | ICD-10-CM | POA: Insufficient documentation

## 2014-11-16 DIAGNOSIS — Z720 Tobacco use: Secondary | ICD-10-CM | POA: Insufficient documentation

## 2014-11-16 DIAGNOSIS — H53149 Visual discomfort, unspecified: Secondary | ICD-10-CM | POA: Insufficient documentation

## 2014-11-16 DIAGNOSIS — R110 Nausea: Secondary | ICD-10-CM | POA: Insufficient documentation

## 2014-11-16 DIAGNOSIS — G8929 Other chronic pain: Secondary | ICD-10-CM | POA: Insufficient documentation

## 2014-11-16 DIAGNOSIS — H538 Other visual disturbances: Secondary | ICD-10-CM | POA: Insufficient documentation

## 2014-11-16 DIAGNOSIS — Z79899 Other long term (current) drug therapy: Secondary | ICD-10-CM | POA: Insufficient documentation

## 2014-11-16 DIAGNOSIS — R519 Headache, unspecified: Secondary | ICD-10-CM

## 2014-11-16 DIAGNOSIS — R51 Headache: Secondary | ICD-10-CM | POA: Insufficient documentation

## 2014-11-16 MED ORDER — KETOROLAC TROMETHAMINE 60 MG/2ML IM SOLN
60.0000 mg | Freq: Once | INTRAMUSCULAR | Status: AC
  Administered 2014-11-16: 60 mg via INTRAMUSCULAR
  Filled 2014-11-16: qty 2

## 2014-11-16 MED ORDER — OXYCODONE-ACETAMINOPHEN 5-325 MG PO TABS
ORAL_TABLET | ORAL | Status: AC
  Filled 2014-11-16: qty 1

## 2014-11-16 MED ORDER — OXYCODONE-ACETAMINOPHEN 5-325 MG PO TABS
1.0000 | ORAL_TABLET | Freq: Once | ORAL | Status: AC
  Administered 2014-11-16: 1 via ORAL

## 2014-11-16 NOTE — Discharge Instructions (Signed)
Please read and follow all provided instructions.  Your diagnoses today include:  1. Nonintractable headache, unspecified chronicity pattern, unspecified headache type    Tests performed today include:  Vital signs. See below for your results today.   Medications:  In the Emergency Department you received:  Toradol - NSAID medication similar to ibuprofen  Take any prescribed medications only as directed.  Additional information:  Follow any educational materials contained in this packet.  You are having a headache. No specific cause was found today for your headache. It Derousse have been a migraine or other cause of headache. Stress, anxiety, fatigue, and depression are common triggers for headaches.   Your headache today does not appear to be life-threatening or require hospitalization, but often the exact cause of headaches is not determined in the emergency department. Therefore, follow-up with your doctor is very important to find out what Broner have caused your headache and whether or not you need any further diagnostic testing or treatment.   Sometimes headaches can appear benign (not harmful), but then more serious symptoms can develop which should prompt an immediate re-evaluation by your doctor or the emergency department.  BE VERY CAREFUL not to take multiple medicines containing Tylenol (also called acetaminophen). Doing so can lead to an overdose which can damage your liver and cause liver failure and possibly death.   Follow-up instructions: Please follow-up with your primary care provider in the next 3 days for further evaluation of your symptoms.   Return instructions:   Please return to the Emergency Department if you experience worsening symptoms.  Return if the medications do not resolve your headache, if it recurs, or if you have multiple episodes of vomiting or cannot keep down fluids.  Return if you have a change from the usual headache.  RETURN IMMEDIATELY IF  you:  Develop a sudden, severe headache  Develop confusion or become poorly responsive or faint  Develop a fever above 100.61F or problem breathing  Have a change in speech, vision, swallowing, or understanding  Develop new weakness, numbness, tingling, incoordination in your arms or legs  Have a seizure  Please return if you have any other emergent concerns.  Additional Information:  Your vital signs today were: BP 93/49 mmHg   Pulse 65   Temp(Src) 99 F (37.2 C) (Oral)   Resp 18   SpO2 100%   LMP 10/21/2014 If your blood pressure (BP) was elevated above 135/85 this visit, please have this repeated by your doctor within one month. --------------

## 2014-11-16 NOTE — ED Provider Notes (Signed)
History  This chart was scribed for non-physician practitioner, Renne Crigler, PA-C,working with Glynn Octave, MD, by Karle Plumber, ED Scribe. This patient was seen in room C26C/C26C and the patient's care was started at 8:47 PM.  Chief Complaint  Patient presents with  . Headache   The history is provided by the patient and medical records. No language interpreter was used.  HPI Comments: Marissa Smith is a 29 y.o. female who presents to the Emergency Department complaining of a severe HA that began yesterday morning. She states the last time she had a HA this severe was several years ago. She reports associated nausea, eye pain, photophobia and noise sensitivity. She states she has taken Excedrin Migraine, Motrin and Aleve with no significant relief of the pain. Light and sound make the pain worse. She denies alleviating factors. She denies head trauma, injury, or fall, fever, chills, vomiting, dental pain, neck pain or stiffness, cough, difficulty walking, numbness, tingling or weakness in any extremity.   Past Medical History  Diagnosis Date  . Back pain, chronic   . Depressed bipolar I disorder   . Cat allergies   . Seasonal allergies    Past Surgical History  Procedure Laterality Date  . Cholecystectomy  2010   Family History  Problem Relation Age of Onset  . Heart failure Father    History  Substance Use Topics  . Smoking status: Current Every Day Smoker -- 0.25 packs/day    Types: Cigarettes  . Smokeless tobacco: Never Used  . Alcohol Use: Yes   OB History    No data available     Review of Systems  Constitutional: Negative for fever and chills.  HENT: Negative for congestion, dental problem, rhinorrhea and sinus pressure.   Eyes: Positive for photophobia and visual disturbance. Negative for discharge and redness.  Respiratory: Negative for cough and shortness of breath.   Cardiovascular: Negative for chest pain.  Gastrointestinal: Positive for nausea.  Negative for vomiting.  Musculoskeletal: Negative for gait problem, neck pain and neck stiffness.  Skin: Negative for color change and rash.  Neurological: Positive for headaches. Negative for syncope, speech difficulty, weakness, light-headedness and numbness.  Psychiatric/Behavioral: Negative for confusion.    Allergies  Darvocet and Miconazole nitrate  Home Medications   Prior to Admission medications   Medication Sig Start Date End Date Taking? Authorizing Provider  aspirin-acetaminophen-caffeine (EXCEDRIN MIGRAINE) [phone] MG per tablet Take 2 tablets by mouth every 6 (six) hours as needed for headache or migraine.   Yes Historical Provider, MD  EPINEPHrine 0.3 mg/0.3 mL IJ SOAJ injection Inject 0.3 mLs (0.3 mg total) into the muscle once. 05/31/14  Yes Rodolph Bong, MD  PARoxetine (PAXIL) 20 MG tablet Take 1 tablet (20 mg total) by mouth daily. 10/16/14  Yes Brock Bad, MD  Prenat-FePoly-Metf-FA-DHA-DSS (VITAFOL FE+) 90-1-200 & 50 MG CPPK Take 1 capsule by mouth daily before breakfast. 10/16/14  Yes Brock Bad, MD  tinidazole (TINDAMAX) 500 MG tablet Take 2 tablets (1,000 mg total) by mouth daily with breakfast. 10/19/14  Yes Brock Bad, MD  zolpidem (AMBIEN) 10 MG tablet Take 10 mg by mouth at bedtime as needed. for sleep 10/31/14  Yes Historical Provider, MD   Triage Vitals: BP 116/57 mmHg  Pulse 77  Temp(Src) 98.3 F (36.8 C) (Oral)  Resp 18  SpO2 97%  LMP 10/21/2014  Physical Exam  Constitutional: She is oriented to person, place, and time. She appears well-developed and well-nourished.  HENT:  Head:  Normocephalic and atraumatic.  Right Ear: Tympanic membrane, external ear and ear canal normal.  Left Ear: Tympanic membrane, external ear and ear canal normal.  Nose: Nose normal.  Mouth/Throat: Uvula is midline, oropharynx is clear and moist and mucous membranes are normal.  Eyes: Conjunctivae, EOM and lids are normal. Pupils are equal, round, and reactive to  light. Right eye exhibits no nystagmus. Left eye exhibits no nystagmus.  Neck: Normal range of motion. Neck supple.  Negative for meningismus.  Cardiovascular: Normal rate and regular rhythm.   Pulmonary/Chest: Effort normal and breath sounds normal.  Abdominal: Soft. There is no tenderness.  Musculoskeletal: Normal range of motion.       Cervical back: She exhibits normal range of motion, no tenderness and no bony tenderness.  Neurological: She is alert and oriented to person, place, and time. She has normal strength and normal reflexes. No cranial nerve deficit or sensory deficit. She displays a negative Romberg sign. Coordination and gait normal. GCS eye subscore is 4. GCS verbal subscore is 5. GCS motor subscore is 6.  Skin: Skin is warm and dry.  Psychiatric: She has a normal mood and affect. Her behavior is normal.  Nursing note and vitals reviewed.   ED Course  Procedures (including critical care time) DIAGNOSTIC STUDIES: Oxygen Saturation is 97% on RA, normal by my interpretation.   COORDINATION OF CARE: 8:53 PM- Informed pt that Reglan, Benadryl and Toradol would be ordered but she declined Benadryl and Reglan stating she did not want anything that would make her sleepy. Will order Toradol. Pt verbalizes understanding and agrees to plan.  Medications  oxyCODONE-acetaminophen (PERCOCET/ROXICET) 5-325 MG per tablet (not administered)  oxyCODONE-acetaminophen (PERCOCET/ROXICET) 5-325 MG per tablet 1 tablet (1 tablet Oral Given 11/16/14 1748)   Labs Review Labs Reviewed - No data to display  Imaging Review No results found.   EKG Interpretation None       Vital signs reviewed and are as follows: Filed Vitals:   11/16/14 2116  BP: 93/49  Pulse: 65  Temp:   Resp: 18   Patient ambulated well without lightheadedness. Low BP likely due to Percocet she received prior to coming back to her room.    MDM   Final diagnoses:  Nonintractable headache, unspecified chronicity  pattern, unspecified headache type   Patient without high-risk features of headache including: sudden onset/thunderclap HA, no similar headache in past, altered mental status, accompanying seizure, headache with exertion, age > 50, history of immunocompromise, neck or shoulder pain, fever, use of anticoagulation, family history of spontaneous SAH, concomitant drug use, toxic exposure.   Patient has a normal complete neurological exam, normal vital signs, normal level of consciousness, no signs of meningismus, is well-appearing/non-toxic appearing, no signs of trauma.   Imaging with CT/MRI not indicated given history and physical exam findings.   No dangerous or life-threatening conditions suspected or identified by history, physical exam, and by work-up. No indications for hospitalization identified.   I personally performed the services described in this documentation, which was scribed in my presence. The recorded information has been reviewed and is accurate.     Renne Crigler, PA-C 11/16/14 2134  Glynn Octave, MD 11/17/14 [phone]

## 2014-11-16 NOTE — ED Notes (Signed)
Pt reports severe headache since yesterday with n/v, vision changes and sensitivity to light. No relief with excedrin migraine.

## 2014-11-19 ENCOUNTER — Other Ambulatory Visit: Payer: Self-pay | Admitting: Obstetrics

## 2014-11-19 ENCOUNTER — Telehealth: Payer: Self-pay | Admitting: *Deleted

## 2014-11-19 DIAGNOSIS — N946 Dysmenorrhea, unspecified: Secondary | ICD-10-CM

## 2014-11-19 MED ORDER — OXYCODONE HCL 10 MG PO TABS: 10.0000 mg | ORAL_TABLET | Freq: Four times a day (QID) | ORAL | Status: DC | PRN

## 2014-11-19 NOTE — Telephone Encounter (Signed)
Patient contacted the office stating she wanted to reschedule her appointment for her Mirena IUD. Patient also requested a refill on her medication for her cramps. Patient states it is time for her cycle to start. Patient advised would speak with Dr. Clearance Coots in regards to her medication. Patient advised to call the office on the first day of her cycle to schedule her IUD appointment. Patient verbalized understanding.  Per Dr. Clearance Coots okay for patient to come by the office to pick up prescription. Prescription for Percocet has been printed and signed and left with front staff for patient to pick up.  Patient aware.

## 2014-11-22 ENCOUNTER — Telehealth: Payer: Self-pay | Admitting: *Deleted

## 2014-11-22 NOTE — Telephone Encounter (Signed)
Patient states she started her cycle 11/21/14. Patient wants to schedule her Mirena IUD insertion. Patient has been scheduled for 11/27/14 @ 10:45 am.  Patient reminded not to have any unprotected intercourse until after her appointment. Patient verbalized understanding.

## 2014-11-27 ENCOUNTER — Encounter: Payer: Self-pay | Admitting: Obstetrics

## 2014-11-27 ENCOUNTER — Ambulatory Visit (INDEPENDENT_AMBULATORY_CARE_PROVIDER_SITE_OTHER): Payer: Medicaid Other | Admitting: Obstetrics

## 2014-11-27 VITALS — BP 123/61 | HR 65 | Temp 98.1°F | Ht 66.0 in | Wt 165.0 lb

## 2014-11-27 DIAGNOSIS — Z0189 Encounter for other specified special examinations: Secondary | ICD-10-CM

## 2014-11-27 DIAGNOSIS — Z30430 Encounter for insertion of intrauterine contraceptive device: Secondary | ICD-10-CM | POA: Diagnosis not present

## 2014-11-27 DIAGNOSIS — Z0000 Encounter for general adult medical examination without abnormal findings: Secondary | ICD-10-CM

## 2014-11-27 DIAGNOSIS — Z975 Presence of (intrauterine) contraceptive device: Secondary | ICD-10-CM

## 2014-11-27 LAB — POCT URINE PREGNANCY: Preg Test, Ur: NEGATIVE

## 2014-11-27 NOTE — Progress Notes (Signed)
IUD Insertion Procedure Note  Pre-operative Diagnosis: Desire LARC  Post-operative Diagnosis: same  Indications: contraception  Procedure Details  Urine pregnancy test was done in office and result was negative.  The risks (including infection, bleeding, pain, and uterine perforation) and benefits of the procedure were explained to the patient and Written informed consent was obtained.    Cervix cleansed with Betadine. Uterus sounded to 7 cm. IUD inserted without difficulty. String visible and trimmed. Patient tolerated procedure well.  IUD Information: Mirena, Lot # [id number], Expiration date 79 / 77.  Condition: Stable  Complications: None  Plan:  The patient was advised to call for any fever or for prolonged or severe pain or bleeding. She was advised to use NSAID as needed for mild to moderate pain.   Attending Physician Documentation: I was present for or participated in the entire procedure, including opening and closing.

## 2014-11-29 ENCOUNTER — Inpatient Hospital Stay (HOSPITAL_COMMUNITY)
Admission: AD | Admit: 2014-11-29 | Discharge: 2014-11-29 | Disposition: A | Discharge status: Home / Self Care | Payer: Medicaid Other | Source: Ambulatory Visit | Attending: Obstetrics | Admitting: Obstetrics

## 2014-11-29 ENCOUNTER — Encounter (HOSPITAL_COMMUNITY): Payer: Self-pay | Admitting: *Deleted

## 2014-11-29 ENCOUNTER — Inpatient Hospital Stay (HOSPITAL_COMMUNITY): Payer: Medicaid Other

## 2014-11-29 DIAGNOSIS — M549 Dorsalgia, unspecified: Secondary | ICD-10-CM | POA: Diagnosis not present

## 2014-11-29 DIAGNOSIS — Z9049 Acquired absence of other specified parts of digestive tract: Secondary | ICD-10-CM | POA: Insufficient documentation

## 2014-11-29 DIAGNOSIS — G8929 Other chronic pain: Secondary | ICD-10-CM | POA: Insufficient documentation

## 2014-11-29 DIAGNOSIS — R109 Unspecified abdominal pain: Secondary | ICD-10-CM | POA: Diagnosis not present

## 2014-11-29 DIAGNOSIS — F17210 Nicotine dependence, cigarettes, uncomplicated: Secondary | ICD-10-CM | POA: Diagnosis not present

## 2014-11-29 DIAGNOSIS — R102 Pelvic and perineal pain: Secondary | ICD-10-CM | POA: Diagnosis not present

## 2014-11-29 LAB — URINALYSIS, ROUTINE W REFLEX MICROSCOPIC
Bilirubin Urine: NEGATIVE
Glucose, UA: NEGATIVE mg/dL
Ketones, ur: NEGATIVE mg/dL
Leukocytes, UA: NEGATIVE
Nitrite: NEGATIVE
Protein, ur: NEGATIVE mg/dL
Specific Gravity, Urine: 1.02 (ref 1.005–1.030)
Urobilinogen, UA: 1 mg/dL (ref 0.0–1.0)
pH: 7 (ref 5.0–8.0)

## 2014-11-29 LAB — URINE MICROSCOPIC-ADD ON

## 2014-11-29 LAB — WET PREP, GENITAL
Clue Cells Wet Prep HPF POC: NONE SEEN
Trich, Wet Prep: NONE SEEN
WBC, Wet Prep HPF POC: NONE SEEN
Yeast Wet Prep HPF POC: NONE SEEN

## 2014-11-29 LAB — POCT PREGNANCY, URINE: Preg Test, Ur: NEGATIVE

## 2014-11-29 MED ORDER — KETOROLAC TROMETHAMINE 60 MG/2ML IM SOLN
60.0000 mg | Freq: Once | INTRAMUSCULAR | Status: AC
  Administered 2014-11-29: 60 mg via INTRAMUSCULAR
  Filled 2014-11-29: qty 2

## 2014-11-29 MED ORDER — OXYCODONE HCL 5 MG PO TABS: 5.0000 mg | ORAL_TABLET | Freq: Four times a day (QID) | ORAL | Status: DC | PRN

## 2014-11-29 NOTE — MAU Note (Signed)
Pt reports she is having problems /pain with her IUD. Had it placed on Tuesday. Called MD and U/S set up for September. Pt can't wait that long taking  Ibuprofen without relief.

## 2014-11-29 NOTE — MAU Provider Note (Signed)
History     CSN: [id number]  Arrival date and time: 11/29/14 1310   First Provider Initiated Contact with Patient 11/29/14 1445      Chief Complaint  Patient presents with  . Contraception   HPI  [name] is a G4P0 here with report of pelvic pain that intensified yesterday.  Patient believes pain is a result of having an IUD placed two days ago.  Pain is described as a stabbing pain and rated a 9/10.  No report of vaginal bleeding.  Reports taking ibuprofen 800 mg with minimal relief.  No report of abnormal vaginal discharge.    Past Medical History  Diagnosis Date  . Back pain, chronic   . Depressed bipolar I disorder   . Cat allergies   . Seasonal allergies     Past Surgical History  Procedure Laterality Date  . Cholecystectomy  2010    Family History  Problem Relation Age of Onset  . Heart failure Father     Social History  Substance Use Topics  . Smoking status: Current Every Day Smoker -- 0.25 packs/day    Types: Cigarettes  . Smokeless tobacco: Never Used  . Alcohol Use: Yes    Allergies:  Allergies  Allergen Reactions  . Darvocet [Propoxyphene N-Acetaminophen] Nausea Only and Other (See Comments)    dizziness  . Miconazole Nitrate Swelling and Rash    Cream caused swelling and rash    Prescriptions prior to admission  Medication Sig Dispense Refill Last Dose  . aspirin-acetaminophen-caffeine (EXCEDRIN MIGRAINE) 250-250-65 MG per tablet Take 2 tablets by mouth every 6 (six) hours as needed for headache or migraine.   Taking  . EPINEPHrine 0.3 mg/0.3 mL IJ SOAJ injection Inject 0.3 mLs (0.3 mg total) into the muscle once. 1 Device 1 Taking  . Oxycodone HCl 10 MG TABS Take 1 tablet (10 mg total) by mouth every 6 (six) hours as needed. 40 tablet 0 Taking  . PARoxetine (PAXIL) 20 MG tablet Take 1 tablet (20 mg total) by mouth daily. 30 tablet 11 Taking  . Prenat-FePoly-Metf-FA-DHA-DSS (VITAFOL FE+) 90-1-200 & 50 MG CPPK Take 1 capsule by mouth  daily before breakfast. 30 each 11 Taking  . tinidazole (TINDAMAX) 500 MG tablet Take 2 tablets (1,000 mg total) by mouth daily with breakfast. (Patient not taking: Reported on 11/27/2014) 10 tablet 2 Not Taking  . zolpidem (AMBIEN) 10 MG tablet Take 10 mg by mouth at bedtime as needed. for sleep  2 Not Taking    Review of Systems  Constitutional: Negative for fever and chills.  Gastrointestinal: Positive for nausea and abdominal pain. Negative for vomiting.  Genitourinary: Negative for dysuria, urgency and frequency.       Vaginal bleeding  All other systems reviewed and are negative.  Physical Exam   Blood pressure 97/57, pulse 62, temperature 97.8 F (36.6 C), temperature source Oral, resp. rate 18, height  (1.651 m), weight 164 lb 3.2 oz (74.481 kg), last menstrual period 11/22/2014.  Physical Exam  Constitutional: She is oriented to person, place, and time. She appears well-developed and well-nourished. No distress.  HENT:  Head: Normocephalic.  Eyes: Pupils are equal, round, and reactive to light.  Neck: Normal range of motion. Neck supple.  Cardiovascular: Normal rate, regular rhythm and normal heart sounds.   Respiratory: Effort normal and breath sounds normal.  GI: Soft. She exhibits no mass. There is tenderness. There is no guarding.  Genitourinary: Vaginal discharge (white, creamy) found.  Negative cervical motion tenderness; IUD strings not visualized  Neurological: She is alert and oriented to person, place, and time. She has normal reflexes.  Skin: Skin is warm and dry.    MAU Course  Procedures   Meds:  Toradol 60 mg IM > pt reports slight decrease in pain    Results for orders placed or performed during the hospital encounter of 11/29/14 (from the past 24 hour(s))  Urinalysis, Routine w reflex microscopic (not at [facility])     Status: Abnormal   Collection Time: 11/29/14  1:30 PM  Result Value Ref Range   Color, Urine YELLOW YELLOW   APPearance CLEAR  CLEAR   Specific Gravity, Urine 1.020 1.005 - 1.030   pH 7.0 5.0 - 8.0   Glucose, UA NEGATIVE NEGATIVE mg/dL   Hgb urine dipstick MODERATE (A) NEGATIVE   Bilirubin Urine NEGATIVE NEGATIVE   Ketones, ur NEGATIVE NEGATIVE mg/dL   Protein, ur NEGATIVE NEGATIVE mg/dL   Urobilinogen, UA 1.0 0.0 - 1.0 mg/dL   Nitrite NEGATIVE NEGATIVE   Leukocytes, UA NEGATIVE NEGATIVE  Urine microscopic-add on     Status: Abnormal   Collection Time: 11/29/14  1:30 PM  Result Value Ref Range   Squamous Epithelial / LPF FEW (A) RARE   RBC / HPF 0-2 <3 RBC/hpf   Bacteria, UA FEW (A) RARE  Pregnancy, urine POC     Status: None   Collection Time: 11/29/14  1:57 PM  Result Value Ref Range   Preg Test, Ur NEGATIVE NEGATIVE  Wet prep, genital     Status: None   Collection Time: 11/29/14  3:47 PM  Result Value Ref Range   Yeast Wet Prep HPF POC NONE SEEN NONE SEEN   Trich, Wet Prep NONE SEEN NONE SEEN   Clue Cells Wet Prep HPF POC NONE SEEN NONE SEEN   WBC, Wet Prep HPF POC NONE SEEN NONE SEEN   Ultrasound:  CLINICAL DATA: Patient status post intrauterine device placement. Pelvic pain.  EXAM: TRANSABDOMINAL AND TRANSVAGINAL ULTRASOUND OF PELVIS  TECHNIQUE: Both transabdominal and transvaginal ultrasound examinations of the pelvis were performed. Transabdominal technique was performed for global imaging of the pelvis including uterus, ovaries, adnexal regions, and pelvic cul-de-sac. It was necessary to proceed with endovaginal exam following the transabdominal exam to visualize the endometrium.  COMPARISON: None  FINDINGS: Uterus  Measurements: 8.9 x 3.9 x 5.8 cm. No fibroids or other mass visualized.  Endometrium  Thickness: 6 mm. Intrauterine device in appropriate position.  Right ovary  Measurements: 2.8 x 1.6 x 1.9 cm. Normal appearance/no adnexal mass.  Left ovary  Measurements: 3.0 x 2.3 x 2.7 cm. Normal appearance/no adnexal mass.  Other findings  No free  fluid.  IMPRESSION: Intrauterine device in appropriate position.   1640 Consulted with Dr Clearance Coots > Reviewed HPI/Exam/labs/ultrasound results/pain level > offer percocet and have patient follow-up in office in AM  1645 Pt declines percocet in MAU, will wait to get RX   Assessment and Plan  Pelvic Pain in Female  Plan: Discharge to home GC/CT pending RX Percocet #15 Follow-up in Dr. Verdell Carmine office in AM  [place], [facility] N 11/29/2014, 4:58 PM

## 2014-11-30 LAB — GC/CHLAMYDIA PROBE AMP (~~LOC~~) NOT AT ARMC
Chlamydia: NEGATIVE
Neisseria Gonorrhea: NEGATIVE

## 2014-12-01 LAB — WOUND CULTURE

## 2014-12-11 ENCOUNTER — Ambulatory Visit (HOSPITAL_COMMUNITY): Admission: RE | Admit: 2014-12-11 | Payer: Medicaid Other | Source: Ambulatory Visit

## 2015-01-08 ENCOUNTER — Ambulatory Visit: Payer: Medicaid Other | Admitting: Obstetrics

## 2015-01-10 ENCOUNTER — Telehealth: Payer: Self-pay | Admitting: *Deleted

## 2015-01-10 NOTE — Telephone Encounter (Signed)
Patient states she has her IUD replaced in August and she has had intermittant bleeding ever since. She is having severe cramping. She is surprised at her body response because she did not have this problem before. She is requesting something for her pain- she has been to the hospital and her [place] was normal with her IUD in the right place.

## 2015-01-11 ENCOUNTER — Other Ambulatory Visit: Payer: Self-pay | Admitting: Obstetrics

## 2015-01-11 DIAGNOSIS — N939 Abnormal uterine and vaginal bleeding, unspecified: Secondary | ICD-10-CM

## 2015-01-11 DIAGNOSIS — N946 Dysmenorrhea, unspecified: Secondary | ICD-10-CM

## 2015-01-11 DIAGNOSIS — B9689 Other specified bacterial agents as the cause of diseases classified elsewhere: Secondary | ICD-10-CM

## 2015-01-11 DIAGNOSIS — N760 Acute vaginitis: Secondary | ICD-10-CM

## 2015-01-11 MED ORDER — CLINDAMYCIN HCL 300 MG PO CAPS: 300.0000 mg | ORAL_CAPSULE | Freq: Three times a day (TID) | ORAL | Status: DC

## 2015-01-11 MED ORDER — OXYCODONE HCL 10 MG PO TABS: 10.0000 mg | ORAL_TABLET | Freq: Four times a day (QID) | ORAL | Status: DC | PRN

## 2015-01-11 MED ORDER — LEVONORGESTREL-ETHINYL ESTRAD 0.15-30 MG-MCG PO TABS: ORAL_TABLET | ORAL | Status: DC

## 2015-02-08 ENCOUNTER — Encounter (HOSPITAL_COMMUNITY): Payer: Self-pay

## 2015-02-08 ENCOUNTER — Emergency Department (HOSPITAL_COMMUNITY)
Admission: EM | Admit: 2015-02-08 | Discharge: 2015-02-08 | Disposition: A | Discharge status: Home / Self Care | Payer: Medicaid Other | Attending: Emergency Medicine | Admitting: Emergency Medicine

## 2015-02-08 DIAGNOSIS — R21 Rash and other nonspecific skin eruption: Secondary | ICD-10-CM | POA: Insufficient documentation

## 2015-02-08 DIAGNOSIS — F319 Bipolar disorder, unspecified: Secondary | ICD-10-CM | POA: Diagnosis not present

## 2015-02-08 DIAGNOSIS — J069 Acute upper respiratory infection, unspecified: Secondary | ICD-10-CM

## 2015-02-08 DIAGNOSIS — G8929 Other chronic pain: Secondary | ICD-10-CM | POA: Insufficient documentation

## 2015-02-08 DIAGNOSIS — R05 Cough: Secondary | ICD-10-CM | POA: Diagnosis present

## 2015-02-08 DIAGNOSIS — R51 Headache: Secondary | ICD-10-CM | POA: Insufficient documentation

## 2015-02-08 DIAGNOSIS — B356 Tinea cruris: Secondary | ICD-10-CM | POA: Diagnosis not present

## 2015-02-08 DIAGNOSIS — Z792 Long term (current) use of antibiotics: Secondary | ICD-10-CM | POA: Diagnosis not present

## 2015-02-08 DIAGNOSIS — Z79899 Other long term (current) drug therapy: Secondary | ICD-10-CM | POA: Insufficient documentation

## 2015-02-08 DIAGNOSIS — Z720 Tobacco use: Secondary | ICD-10-CM | POA: Diagnosis not present

## 2015-02-08 DIAGNOSIS — R059 Cough, unspecified: Secondary | ICD-10-CM

## 2015-02-08 MED ORDER — GUAIFENESIN-CODEINE 100-10 MG/5ML PO SOLN: 5.0000 mL | Freq: Four times a day (QID) | ORAL | Status: DC | PRN

## 2015-02-08 MED ORDER — TERBINAFINE HCL 1 % EX CREA: 1.0000 "application " | TOPICAL_CREAM | Freq: Two times a day (BID) | CUTANEOUS | Status: DC

## 2015-02-08 MED ORDER — AZITHROMYCIN 250 MG PO TABS: ORAL_TABLET | ORAL | Status: DC

## 2015-02-08 NOTE — ED Notes (Addendum)
Pt. Having nasal congestion ,chest congestion, cough .  Pt. Taking Claritin and Mucinex D.  Cough is  Congested and tight.  Pt. Having chills.   Also having sinus headache, fatique, rash on right lower leg.

## 2015-02-08 NOTE — ED Provider Notes (Signed)
CSN: [id number]     Arrival date & time 02/08/15  1744 History   By signing my name below, I, Gonzella LexKimberly Bianca Gray, attest that this documentation has been prepared under the direction and in the presence of Arthor CaptainAbigail Fahima Cifelli, PA-C Electronically Signed: Gonzella LexKimberly Bianca Gray, Scribe. 02/08/2015. 6:45 PM.  Chief Complaint  Patient presents with  . Cough    nasal congestion    The history is provided by the patient. No language interpreter was used.    HPI Comments: Marissa Smith is a 29 y.o. female who presents to the Emergency Department complaining of gradually worsening cough onset a week and a half ago. Pt reports chills, chest pain secondary to cough, rash to right inner thigh, and pounding frontal HA worsened by cough. Pt also reports a suspision of ring worm on RLE. Pt has taken Claritan and Musinex D with mild relief. Pt denies fever, SOB, and history of asthma. Pt has a history of smoking.   Past Medical History  Diagnosis Date  . Back pain, chronic   . Depressed bipolar I disorder (HCC)   . Cat allergies   . Seasonal allergies    Past Surgical History  Procedure Laterality Date  . Cholecystectomy  2010   Family History  Problem Relation Age of Onset  . Heart failure Father    Social History  Substance Use Topics  . Smoking status: Current Every Day Smoker -- 0.25 packs/day    Types: Cigarettes  . Smokeless tobacco: Never Used  . Alcohol Use: Yes   OB History    Gravida Para Term Preterm AB TAB SAB Ectopic Multiple Living   4         3     Review of Systems  Constitutional: Positive for chills. Negative for fever.  Respiratory: Positive for cough. Negative for shortness of breath.   Cardiovascular: Positive for chest pain ( secondary to cough).  Skin: Positive for rash ( right upper thigh).  Neurological: Positive for headaches.      Allergies  Darvocet and Miconazole nitrate  Home Medications   Prior to Admission medications   Medication Sig Start Date  End Date Taking? Authorizing Provider  azithromycin (ZITHROMAX Z-PAK) 250 MG tablet 2 po day one, then 1 daily x 4 days 02/08/15   Arthor CaptainAbigail Johnny Gorter, PA-C  clindamycin (CLEOCIN) 300 MG capsule Take 1 capsule (300 mg total) by mouth 3 (three) times daily. 01/11/15   Brock Badharles A Harper, MD  EPINEPHrine 0.3 mg/0.3 mL IJ SOAJ injection Inject 0.3 mLs (0.3 mg total) into the muscle once. 05/31/14   Rodolph BongEvan S Corey, MD  guaiFENesin-codeine 100-10 MG/5ML syrup Take 5-10 mLs by mouth every 6 (six) hours as needed for cough. 02/08/15   Arthor CaptainAbigail Bristyn Kulesza, PA-C  ibuprofen (ADVIL,MOTRIN) 800 MG tablet Take 800 mg by mouth every 8 (eight) hours as needed for moderate pain.    Historical Provider, MD  levonorgestrel-ethinyl estradiol (NORDETTE) 0.15-30 MG-MCG tablet Take 1 tablet po every 12 hours 01/11/15   Brock Badharles A Harper, MD  PARoxetine (PAXIL) 20 MG tablet Take 1 tablet (20 mg total) by mouth daily. 10/16/14   Brock Badharles A Harper, MD  Prenat-FePoly-Metf-FA-DHA-DSS (VITAFOL FE+) 90-1-200 & 50 MG CPPK Take 1 capsule by mouth daily before breakfast. 10/16/14   Brock Badharles A Harper, MD  terbinafine (LAMISIL AT) 1 % cream Apply 1 application topically 2 (two) times daily. 02/08/15   Juanmiguel Defelice, PA-C   BP 105/63 mmHg  Pulse 63  Temp(Src) 97.7 F (36.5 C) (  Oral)  Resp 16  SpO2 100%  LMP 02/03/2015 Physical Exam  Constitutional: She is oriented to person, place, and time. She appears well-developed and well-nourished. No distress.  HENT:  Head: Normocephalic and atraumatic.  Eyes: Conjunctivae are normal.  Cardiovascular: Normal rate.   Pulmonary/Chest: Effort normal and breath sounds normal. She has no wheezes.  Rhonchi clear with cough No wheezes  Abdominal: She exhibits no distension.  Neurological: She is alert and oriented to person, place, and time.  Skin: Skin is warm and dry.  Right thigh circular rash with reddened borders, central clearing and scaling present   Psychiatric: She has a normal mood and affect.   Nursing note and vitals reviewed.   ED Course  Procedures  DIAGNOSTIC STUDIES:    Oxygen Saturation is 100% on room air, normal by my interpretation.   COORDINATION OF CARE:  6:20 PM Will treat with antibiotic for cough and for ringworm. Discussed treatment plan with pt at bedside and pt agreed to plan.    Labs Review Labs Reviewed - No data to display  Imaging Review No results found.   EKG Interpretation None      MDM   Final diagnoses:  Cough  URI (upper respiratory infection)  Groin ringworm     Patient with URI symptoms, sinus tenderness. Has been going on for 2 weeks with a period where she was feeling better and then got worse. We'll treat for presumed secondary bacterial infection with azithromycin. Patient will get Lamisil cream for her tinea corporis infection and cough suppressants. She is afebrile and hemodynamically stable. She appears safe for discharge at this time. I personally performed the services described in this documentation, which was scribed in my presence. The recorded information has been reviewed and is accurate.       Arthor Captain, PA-C 02/08/15 1851  Mirian Mo, MD 02/14/15 [phone]

## 2015-02-08 NOTE — Discharge Instructions (Signed)
You appear to have an upper respiratory infection (URI). An upper respiratory tract infection, or cold, is a viral infection of the air passages leading to the lungs. It is contagious and can be spread to others, especially during the first 3 or 4 days. It cannot be cured by antibiotics or other medicines. RETURN IMMEDIATELY IF you develop shortness of breath, confusion or altered mental status, a new rash, become dizzy, faint, or poorly responsive, or are unable to be cared for at home.  Upper Respiratory Infection, Adult Most upper respiratory infections (URIs) are a viral infection of the air passages leading to the lungs. A URI affects the nose, throat, and upper air passages. The most common type of URI is nasopharyngitis and is typically referred to as "the common cold." URIs run their course and usually go away on their own. Most of the time, a URI does not require medical attention, but sometimes a bacterial infection in the upper airways can follow a viral infection. This is called a secondary infection. Sinus and middle ear infections are common types of secondary upper respiratory infections. Bacterial pneumonia can also complicate a URI. A URI can worsen asthma and chronic obstructive pulmonary disease (COPD). Sometimes, these complications can require emergency medical care and [place] be life threatening.  CAUSES Almost all URIs are caused by viruses. A virus is a type of germ and can spread from one person to another.  RISKS FACTORS You Akhavan be at risk for a URI if:   You smoke.   You have chronic heart or lung disease.  You have a weakened defense (immune) system.   You are very young or very old.   You have nasal allergies or asthma.  You work in crowded or poorly ventilated areas.  You work in health care facilities or schools. SIGNS AND SYMPTOMS  Symptoms typically develop 2-3 days after you come in contact with a cold virus. Most viral URIs last 7-10 days. However, viral  URIs from the influenza virus (flu virus) can last 14-18 days and are typically more severe. Symptoms [place] include:   Runny or stuffy (congested) nose.   Sneezing.   Cough.   Sore throat.   Headache.   Fatigue.   Fever.   Loss of appetite.   Pain in your forehead, behind your eyes, and over your cheekbones (sinus pain).  Muscle aches.  DIAGNOSIS  Your health care provider Hilbert diagnose a URI by:  Physical exam.  Tests to check that your symptoms are not due to another condition such as:  Strep throat.  Sinusitis.  Pneumonia.  Asthma. TREATMENT  A URI goes away on its own with time. It cannot be cured with medicines, but medicines Hunt be prescribed or recommended to relieve symptoms. Medicines [place] help:  Reduce your fever.  Reduce your cough.  Relieve nasal congestion. HOME CARE INSTRUCTIONS   Take medicines only as directed by your health care provider.   Gargle warm saltwater or take cough drops to comfort your throat as directed by your health care provider.  Use a warm mist humidifier or inhale steam from a shower to increase air moisture. This [place] make it easier to breathe.  Drink enough fluid to keep your urine clear or pale yellow.   Eat soups and other clear broths and maintain good nutrition.   Rest as needed.   Return to work when your temperature has returned to normal or as your health care provider advises. You [place] need to stay home  longer to avoid infecting others. You can also use a face mask and careful hand washing to prevent spread of the virus.  Increase the usage of your inhaler if you have asthma.   Do not use any tobacco products, including cigarettes, chewing tobacco, or electronic cigarettes. If you need help quitting, ask your health care provider. PREVENTION  The best way to protect yourself from getting a cold is to practice good hygiene.   Avoid oral or hand contact with people with cold symptoms.   Wash your  hands often if contact occurs.  There is no clear evidence that vitamin C, vitamin E, echinacea, or exercise reduces the chance of developing a cold. However, it is always recommended to get plenty of rest, exercise, and practice good nutrition.  SEEK MEDICAL CARE IF:   You are getting worse rather than better.   Your symptoms are not controlled by medicine.   You have chills.  You have worsening shortness of breath.  You have brown or red mucus.  You have yellow or brown nasal discharge.  You have pain in your face, especially when you bend forward.  You have a fever.  You have swollen neck glands.  You have pain while swallowing.  You have white areas in the back of your throat. SEEK IMMEDIATE MEDICAL CARE IF:   You have severe or persistent:  Headache.  Ear pain.  Sinus pain.  Chest pain.  You have chronic lung disease and any of the following:  Wheezing.  Prolonged cough.  Coughing up blood.  A change in your usual mucus.  You have a stiff neck.  You have changes in your:  Vision.  Hearing.  Thinking.  Mood. MAKE SURE YOU:   Understand these instructions.  Will watch your condition.  Will get help right away if you are not doing well or get worse.   This information is not intended to replace advice given to you by your health care provider. Make sure you discuss any questions you have with your health care provider.   Document Released: 09/23/2000 Document Revised: 08/14/2014 Document Reviewed: 07/05/2013 Elsevier Interactive Patient Education 2016 Elsevier Inc. Body Ringworm Ringworm (tinea corporis) is a fungal infection of the skin on the body. This infection is not caused by worms, but is actually caused by a fungus. Fungus normally lives on the top of your skin and can be useful. However, in the case of ringworms, the fungus grows out of control and causes a skin infection. It can involve any area of skin on the body and can spread  easily from one person to another (contagious). Ringworm is a common problem for children, but it can affect adults as well. Ringworm is also often found in athletes, especially wrestlers who share equipment and mats.  CAUSES  Ringworm of the body is caused by a fungus called dermatophyte. It can spread by:  Touchingother people who are infected.  Touchinginfected pets.  Touching or sharingobjects that have been in contact with the infected person or pet (hats, combs, towels, clothing, sports equipment). SYMPTOMS   Itchy, raised red spots and bumps on the skin.  Ring-shaped rash.  Redness near the border of the rash with a clear center.  Dry and scaly skin on or around the rash. Not every person develops a ring-shaped rash. Some develop only the red, scaly patches. DIAGNOSIS  Most often, ringworm can be diagnosed by performing a skin exam. Your caregiver Puccinelli choose to take a skin scraping from  the affected area. The sample will be examined under the microscope to see if the fungus is present.  TREATMENT  Body ringworm Rivet be treated with a topical antifungal cream or ointment. Sometimes, an antifungal shampoo that can be used on your body is prescribed. You Garciagarcia be prescribed antifungal medicines to take by mouth if your ringworm is severe, keeps coming back, or lasts a long time.  HOME CARE INSTRUCTIONS   Only take over-the-counter or prescription medicines as directed by your caregiver.  Wash the infected area and dry it completely before applying yourcream or ointment.  When using antifungal shampoo to treat the ringworm, leave the shampoo on the body for 3-5 minutes before rinsing.   Wear loose clothing to stop clothes from rubbing and irritating the rash.  Wash or change your bed sheets every night while you have the rash.  Have your pet treated by your veterinarian if it has the same infection. To prevent ringworm:   Practice good hygiene.  Wear sandals or shoes in  public places and showers.  Do not share personal items with others.  Avoid touching red patches of skin on other people.  Avoid touching pets that have bald spots or wash your hands after doing so. SEEK MEDICAL CARE IF:   Your rash continues to spread after 7 days of treatment.  Your rash is not gone in 4 weeks.  The area around your rash becomes red, warm, tender, and swollen.   This information is not intended to replace advice given to you by your health care provider. Make sure you discuss any questions you have with your health care provider.   Document Released: 03/27/2000 Document Revised: 12/23/2011 Document Reviewed: 10/12/2011 Elsevier Interactive Patient Education [employer]

## 2015-03-12 ENCOUNTER — Encounter: Payer: Self-pay | Admitting: Obstetrics

## 2015-03-12 ENCOUNTER — Ambulatory Visit (INDEPENDENT_AMBULATORY_CARE_PROVIDER_SITE_OTHER): Payer: Medicaid Other | Admitting: Obstetrics

## 2015-03-12 VITALS — BP 130/80 | HR 90 | Temp 98.3°F | Ht 64.0 in | Wt 166.0 lb

## 2015-03-12 DIAGNOSIS — R102 Pelvic and perineal pain: Secondary | ICD-10-CM | POA: Diagnosis not present

## 2015-03-12 DIAGNOSIS — Z30432 Encounter for removal of intrauterine contraceptive device: Secondary | ICD-10-CM | POA: Diagnosis not present

## 2015-03-12 DIAGNOSIS — Z3009 Encounter for other general counseling and advice on contraception: Secondary | ICD-10-CM

## 2015-03-12 MED ORDER — OXYCODONE HCL 10 MG PO TABS: 10.0000 mg | ORAL_TABLET | Freq: Four times a day (QID) | ORAL | Status: DC | PRN

## 2015-03-12 MED ORDER — KETOROLAC TROMETHAMINE 60 MG/2ML IM SOLN
60.0000 mg | Freq: Once | INTRAMUSCULAR | Status: AC
  Administered 2015-03-12: 60 mg via INTRAMUSCULAR

## 2015-03-12 NOTE — Progress Notes (Signed)
Marissa Smith is a 29 y.o.who presents for evaluation of abdominal pain. The pain is described as cramping and sharp, and is 8/10 in intensity. Pain is located in the deep pelvis area with radiation to the back. Onset was since IUD insertion occurring 3 months ago. Symptoms have been unchanged since. Aggravating factors: bowel movement. Alleviating factors: heating pad. Associated symptoms: nausea. The patient denies chills, constipation, diarrhea, dysuria and vomiting. Risk factors for pelvic/abdominal pain include IUD in place.  Menstrual History: OB History   Grav Para Term Preterm Abortions TAB SAB Ect Mult Living   0 0 0 0 0 0 0 0 0 0        Patient's last menstrual period was 07/17/2013.   Patient Active Problem List   Diagnosis Date Noted  . Dysmenorrhea 10/31/2012  . Abnormal uterine bleeding (AUB) 10/31/2012   Past Medical History  Diagnosis Date  . Back pain, chronic   . Depressed bipolar I disorder (HCC)   . Cat allergies   . Seasonal allergies     Past Surgical History  Procedure Laterality Date  . Cholecystectomy  2010     Current outpatient prescriptions:  .  ibuprofen (ADVIL,MOTRIN) 800 MG tablet, Take 800 mg by mouth every 8 (eight) hours as needed for moderate pain., Disp: , Rfl:  .  Prenat-FePoly-Metf-FA-DHA-DSS (VITAFOL FE+) 90-1-200 & 50 MG CPPK, Take 1 capsule by mouth daily before breakfast., Disp: 30 each, Rfl: 11 .  Oxycodone HCl 10 MG TABS, Take 1 tablet (10 mg total) by mouth every 6 (six) hours as needed., Disp: 40 tablet, Rfl: 0 .  PARoxetine (PAXIL) 20 MG tablet, Take 1 tablet (20 mg total) by mouth daily. (Patient not taking: Reported on 03/12/2015), Disp: 30 tablet, Rfl: 11 Allergies  Allergen Reactions  . Darvocet [Propoxyphene N-Acetaminophen] Nausea Only and Other (See Comments)    dizziness  . Miconazole Nitrate Swelling and Rash    Cream caused swelling and rash    Social History  Substance Use Topics  . Smoking status: Current Every Day  Smoker -- 0.25 packs/day    Types: Cigarettes  . Smokeless tobacco: Never Used  . Alcohol Use: No    Family History  Problem Relation Age of Onset  . Heart failure Father        Review of Systems Constitutional: negative for fatigue and weight loss Respiratory: negative for cough and wheezing Cardiovascular: negative for chest pain, fatigue and palpitations Gastrointestinal: negative for abdominal pain and change in bowel habits Genitourinary:negative Integument/breast: negative for nipple discharge Musculoskeletal:negative for myalgias Neurological: negative for gait problems and tremors Behavioral/Psych: negative for abusive relationship, depression Endocrine: negative for temperature intolerance        Objective:  Physical Examination:  BP 130/80 mmHg  Pulse 90  Temp(Src) 98.3 F (36.8 C)  Ht 5\' 4"  (1.626 m)  Wt 166 lb (75.297 kg)  BMI 28.48 kg/m2 General:   alert  Skin:   no rash or abnormalities  Lungs:   clear to auscultation bilaterally  Heart:   regular rate and rhythm, S1, S2 normal, no murmur, click, rub or gallop  Breasts:   normal without suspicious masses, skin or nipple changes or axillary nodes  Abdomen:  normal findings: no organomegaly, soft, non-tender and no hernia  Pelvis:  External genitalia: normal general appearance Urinary system: urethral meatus normal and bladder without fullness, nontender Vaginal: normal without tenderness, induration or masses Cervix: normal appearance.  IUD string visible but shorter than when IUD inserted.  IUD  string grasped with dressing forceps and removed, intact. Adnexa: normal bimanual exam Uterus: midposition and tender, normal size    Lab Review Labs: Wet prep.  U/A.  Culture of IUD.  Imaging                                                                                                                                                                                                                                      Ultrasound showed good position of IUD after insertion.   Pelvic pain assessment form reviewed     Assessment:    Functional: Pelvic pain from IUD malposition    Plan:    IUD removed   The diagnosis was discussed with the patient and evaluation and treatment plans outlined. Follow up in 3 months or as needed.  She will consider contraceptive options.  Gable try another IUD.  Did fine on IUD prior to this one.   Orders Placed This Encounter  Procedures  . Anaerobic culture  . SureSwab Bacterial Vaginosis/itis   Meds ordered this encounter  Medications  . Oxycodone HCl 10 MG TABS    Sig: Take 1 tablet (10 mg total) by mouth every 6 (six) hours as needed.    Dispense:  40 tablet    Refill:  0  . ketorolac (TORADOL) injection 60 mg    Sig:

## 2015-03-15 LAB — SURESWAB BACTERIAL VAGINOSIS/ITIS
Atopobium vaginae: NOT DETECTED Log (cells/mL)
C. albicans, DNA: NOT DETECTED
C. glabrata, DNA: NOT DETECTED
C. parapsilosis, DNA: NOT DETECTED
C. tropicalis, DNA: NOT DETECTED
Gardnerella vaginalis: NOT DETECTED Log (cells/mL)
LACTOBACILLUS SPECIES: NOT DETECTED Log (cells/mL)
MEGASPHAERA SPECIES: NOT DETECTED Log (cells/mL)
T. vaginalis RNA, QL TMA: NOT DETECTED

## 2015-03-16 ENCOUNTER — Encounter (HOSPITAL_COMMUNITY): Payer: Self-pay | Admitting: Emergency Medicine

## 2015-03-16 ENCOUNTER — Emergency Department (HOSPITAL_COMMUNITY)
Admission: EM | Admit: 2015-03-16 | Discharge: 2015-03-16 | Disposition: A | Discharge status: Home / Self Care | Payer: Medicaid Other | Attending: Emergency Medicine | Admitting: Emergency Medicine

## 2015-03-16 DIAGNOSIS — F17210 Nicotine dependence, cigarettes, uncomplicated: Secondary | ICD-10-CM | POA: Insufficient documentation

## 2015-03-16 DIAGNOSIS — K120 Recurrent oral aphthae: Secondary | ICD-10-CM | POA: Diagnosis not present

## 2015-03-16 DIAGNOSIS — F329 Major depressive disorder, single episode, unspecified: Secondary | ICD-10-CM | POA: Diagnosis not present

## 2015-03-16 DIAGNOSIS — Z79899 Other long term (current) drug therapy: Secondary | ICD-10-CM | POA: Insufficient documentation

## 2015-03-16 DIAGNOSIS — J029 Acute pharyngitis, unspecified: Secondary | ICD-10-CM

## 2015-03-16 DIAGNOSIS — G8929 Other chronic pain: Secondary | ICD-10-CM | POA: Diagnosis not present

## 2015-03-16 MED ORDER — PENICILLIN G BENZATHINE 1200000 UNIT/2ML IM SUSP
1.2000 10*6.[IU] | Freq: Once | INTRAMUSCULAR | Status: AC
  Administered 2015-03-16: 1.2 10*6.[IU] via INTRAMUSCULAR
  Filled 2015-03-16: qty 2

## 2015-03-16 MED ORDER — MAGIC MOUTHWASH W/LIDOCAINE: 10.0000 mL | Freq: Three times a day (TID) | ORAL | Status: DC | PRN

## 2015-03-16 NOTE — ED Notes (Signed)
Pt. reports sore throat with swelling " hard to swallow" onset yesterday with occasional productive cough  . Airway intact / respirations unlabored , denies fever or chills. Pt. added " flushed face".

## 2015-03-16 NOTE — ED Provider Notes (Signed)
CSN: [id number]     Arrival date & time 03/16/15  1959 History   First MD Initiated Contact with Patient 03/16/15 2040     Chief Complaint  Patient presents with  . Sore Throat     (Consider location/radiation/quality/duration/timing/severity/associated sxs/prior Treatment) Patient is a 29 y.o. female presenting with pharyngitis. The history is provided by the patient and medical records.  Sore Throat Associated symptoms include a sore throat.   29 year old female with history of chronic back pain, bipolar disorder, seasonal allergies, presenting to the ED for sore throat. She states is been progressing over the past few days, states now it is very painful to swallow. Patient works at [employer] has had multiple sick contacts recently. She states she has an occasional cough which she attributes to her smoking. She denies any chest pain or shortness of breath. No fever or chills.    Past Medical History  Diagnosis Date  . Back pain, chronic   . Depressed bipolar I disorder (HCC)   . Cat allergies   . Seasonal allergies    Past Surgical History  Procedure Laterality Date  . Cholecystectomy  2010   Family History  Problem Relation Age of Onset  . Heart failure Father    Social History  Substance Use Topics  . Smoking status: Current Every Day Smoker -- 0.25 packs/day    Types: Cigarettes  . Smokeless tobacco: Never Used  . Alcohol Use: No   OB History    Gravida Para Term Preterm AB TAB SAB Ectopic Multiple Living   4         3     Review of Systems  HENT: Positive for sore throat.   All other systems reviewed and are negative.     Allergies  Darvocet and Miconazole nitrate  Home Medications   Prior to Admission medications   Medication Sig Start Date End Date Taking? Authorizing Provider  ibuprofen (ADVIL,MOTRIN) 800 MG tablet Take 800 mg by mouth every 8 (eight) hours as needed for moderate pain.    Historical Provider, MD  Oxycodone HCl 10 MG TABS  Take 1 tablet (10 mg total) by mouth every 6 (six) hours as needed. 03/12/15   Brock Bad, MD  PARoxetine (PAXIL) 20 MG tablet Take 1 tablet (20 mg total) by mouth daily. Patient not taking: Reported on 03/12/2015 10/16/14   Brock Bad, MD  Prenat-FePoly-Metf-FA-DHA-DSS (VITAFOL FE+) 90-1-200 & 50 MG CPPK Take 1 capsule by mouth daily before breakfast. 10/16/14   Brock Bad, MD   BP 115/69 mmHg  Pulse 84  Temp(Src) 97.9 F (36.6 C) (Oral)  Resp 16  Ht  (1.626 m)  Wt 76.459 kg  BMI 28.92 kg/m2  SpO2 98%  LMP 03/14/2015   Physical Exam  Constitutional: She is oriented to person, place, and time. She appears well-developed and well-nourished. No distress.  HENT:  Head: Normocephalic and atraumatic.  Mouth/Throat: Uvula is midline and mucous membranes are normal. No oral lesions. No trismus in the jaw. Posterior oropharyngeal erythema present. No posterior oropharyngeal edema or tonsillar abscesses.  Tonsils 2+ bilaterally with small exudates noted; uvula midline without peritonsillar abscess; handling secretions appropriately; no difficulty swallowing or speaking; small aphthous ulcers noted along right side of tongue; normal phonation, no stridor  Eyes: Conjunctivae and EOM are normal. Pupils are equal, round, and reactive to light.  Neck: Normal range of motion.  Cardiovascular: Normal rate, regular rhythm and normal heart sounds.   Pulmonary/Chest: Effort  normal and breath sounds normal. No respiratory distress. She has no wheezes.  Abdominal: Soft. Bowel sounds are normal. There is no tenderness. There is no guarding.  Musculoskeletal: Normal range of motion.  Lymphadenopathy:    She has cervical adenopathy.  Neurological: She is alert and oriented to person, place, and time.  Skin: Skin is warm and dry. She is not diaphoretic.  Psychiatric: She has a normal mood and affect.  Nursing note and vitals reviewed.   ED Course  Procedures (including critical care  time) Labs Review Labs Reviewed - No data to display  Imaging Review No results found. I have personally reviewed and evaluated these images and lab results as part of my medical decision-making.   EKG Interpretation None      MDM   Final diagnoses:  Sore throat  Aphthous ulcer   29 year old female here with sore throat and painful swallowing the past several days. Patient is afebrile, nontoxic. Tonsils are enlarged bilaterally with exudates noted. Uvula remains midline without evidence of peritonsillar abscess. She also has small aphthous ulcers along the right side of her tongue. Normal phonation, no stridor. Patient clinically with strep pharyngitis. She'll be treated with Bicillin here in the ED. Will start on Magic mouthwash for aphthous ulcers on tongue.  Patient to FU with PCP.  Discussed plan with patient, he/she acknowledged understanding and agreed with plan of care.  Return precautions given for new or worsening symptoms.  Garlon HatchetLisa M Kiam Bransfield, PA-C 03/16/15 2135  Mancel BaleElliott Wentz, MD 03/16/15 [phone]

## 2015-03-16 NOTE — Discharge Instructions (Signed)
Take the prescribed medication as directed-- swish and spit or swallow. This should help with the ulcers on the side of your tongue as well as sore throat. Follow-up with your primary care physician. Return to the ED for new or worsening symptoms.

## 2015-03-19 ENCOUNTER — Ambulatory Visit: Payer: Medicaid Other | Admitting: Obstetrics

## 2015-03-27 LAB — ANAEROBIC CULTURE
Gram Stain: NONE SEEN
Gram Stain: NONE SEEN
Gram Stain: NONE SEEN

## 2015-04-13 ENCOUNTER — Encounter (HOSPITAL_COMMUNITY): Payer: Self-pay | Admitting: *Deleted

## 2015-04-13 ENCOUNTER — Inpatient Hospital Stay (HOSPITAL_COMMUNITY)
Admission: EM | Admit: 2015-04-13 | Discharge: 2015-04-13 | Disposition: A | Discharge status: Home / Self Care | Payer: Medicaid Other | Source: Ambulatory Visit | Attending: Obstetrics | Admitting: Obstetrics

## 2015-04-13 DIAGNOSIS — B379 Candidiasis, unspecified: Secondary | ICD-10-CM | POA: Diagnosis not present

## 2015-04-13 DIAGNOSIS — Z9049 Acquired absence of other specified parts of digestive tract: Secondary | ICD-10-CM | POA: Diagnosis not present

## 2015-04-13 DIAGNOSIS — B373 Candidiasis of vulva and vagina: Secondary | ICD-10-CM

## 2015-04-13 DIAGNOSIS — F17210 Nicotine dependence, cigarettes, uncomplicated: Secondary | ICD-10-CM | POA: Diagnosis not present

## 2015-04-13 DIAGNOSIS — N898 Other specified noninflammatory disorders of vagina: Secondary | ICD-10-CM | POA: Diagnosis present

## 2015-04-13 DIAGNOSIS — F329 Major depressive disorder, single episode, unspecified: Secondary | ICD-10-CM | POA: Diagnosis not present

## 2015-04-13 DIAGNOSIS — F3189 Other bipolar disorder: Secondary | ICD-10-CM | POA: Insufficient documentation

## 2015-04-13 DIAGNOSIS — R102 Pelvic and perineal pain: Secondary | ICD-10-CM | POA: Diagnosis present

## 2015-04-13 LAB — CBC
HCT: 40.5 % (ref 36.0–46.0)
Hemoglobin: 13.6 g/dL (ref 12.0–15.0)
MCH: 31.9 pg (ref 26.0–34.0)
MCHC: 33.6 g/dL (ref 30.0–36.0)
MCV: 94.8 fL (ref 78.0–100.0)
Platelets: 232 10*3/uL (ref 150–400)
RBC: 4.27 MIL/uL (ref 3.87–5.11)
RDW: 13.2 % (ref 11.5–15.5)
WBC: 6.7 10*3/uL (ref 4.0–10.5)

## 2015-04-13 LAB — URINALYSIS, ROUTINE W REFLEX MICROSCOPIC
Bilirubin Urine: NEGATIVE
Glucose, UA: NEGATIVE mg/dL
Hgb urine dipstick: NEGATIVE
Ketones, ur: NEGATIVE mg/dL
Leukocytes, UA: NEGATIVE
Nitrite: NEGATIVE
Protein, ur: NEGATIVE mg/dL
Specific Gravity, Urine: 1.015 (ref 1.005–1.030)
pH: 6.5 (ref 5.0–8.0)

## 2015-04-13 LAB — WET PREP, GENITAL
Clue Cells Wet Prep HPF POC: NONE SEEN
Sperm: NONE SEEN
Trich, Wet Prep: NONE SEEN

## 2015-04-13 LAB — POCT PREGNANCY, URINE: Preg Test, Ur: NEGATIVE

## 2015-04-13 MED ORDER — NYSTATIN-TRIAMCINOLONE 100000-0.1 UNIT/GM-% EX CREA: TOPICAL_CREAM | CUTANEOUS | Status: DC

## 2015-04-13 MED ORDER — KETOROLAC TROMETHAMINE 60 MG/2ML IM SOLN
60.0000 mg | Freq: Once | INTRAMUSCULAR | Status: AC
  Administered 2015-04-13: 60 mg via INTRAMUSCULAR
  Filled 2015-04-13: qty 2

## 2015-04-13 MED ORDER — FLUCONAZOLE 150 MG PO TABS: 150.0000 mg | ORAL_TABLET | Freq: Once | ORAL | Status: DC

## 2015-04-13 NOTE — MAU Provider Note (Signed)
History     CSN: [id number]  Arrival date and time: 04/13/15 1737   First Provider Initiated Contact with Patient 04/13/15 2046      Chief Complaint  Patient presents with  . Abdominal Pain  . Vaginal Pain   HPI Comments: LMP: beginning of January   Vaginal Discharge The patient's primary symptoms include pelvic pain and vaginal discharge. This is a new problem. The current episode started in the past 7 days. The problem occurs constantly. The problem has been unchanged. Pain severity now: 7/10. The problem affects both sides. She is not pregnant. Associated symptoms include abdominal pain and nausea. Pertinent negatives include no chills, constipation, diarrhea, dysuria, fever, frequency, urgency or vomiting. The vaginal discharge was clear and copious. There has been no bleeding. Nothing aggravates the symptoms. She has tried NSAIDs for the symptoms. The treatment provided no relief. She is sexually active. It is possible that her partner has an STD. She uses nothing for contraception. Her menstrual history has been irregular.    Past Medical History  Diagnosis Date  . Back pain, chronic   . Depressed bipolar I disorder (HCC)   . Cat allergies   . Seasonal allergies     Past Surgical History  Procedure Laterality Date  . Cholecystectomy  2010    Family History  Problem Relation Age of Onset  . Heart failure Father     Social History  Substance Use Topics  . Smoking status: Light Tobacco Smoker -- 0.25 packs/day    Types: Cigarettes  . Smokeless tobacco: Never Used  . Alcohol Use: No    Allergies:  Allergies  Allergen Reactions  . Darvocet [Propoxyphene N-Acetaminophen] Nausea And Vomiting and Other (See Comments)    Reaction:  Dizziness   . Miconazole Nitrate Swelling and Rash    Prescriptions prior to admission  Medication Sig Dispense Refill Last Dose  . naproxen sodium (ANAPROX) 220 MG tablet Take 440 mg by mouth 2 (two) times daily as needed (for pain).    04/13/2015 at 1600  . Prenat-FePoly-Metf-FA-DHA-DSS (VITAFOL FE+) 90-1-200 & 50 MG CPPK Take 1 capsule by mouth daily before breakfast. 30 each 11 04/12/2015 at Unknown time  . magic mouthwash w/lidocaine SOLN Take 10 mLs by mouth 3 (three) times daily as needed for mouth pain. (Patient not taking: Reported on 04/13/2015) 100 mL 0   . Oxycodone HCl 10 MG TABS Take 1 tablet (10 mg total) by mouth every 6 (six) hours as needed. (Patient not taking: Reported on 04/13/2015) 40 tablet 0   . PARoxetine (PAXIL) 20 MG tablet Take 1 tablet (20 mg total) by mouth daily. (Patient not taking: Reported on 03/12/2015) 30 tablet 11 Not Taking    Review of Systems  Constitutional: Negative for fever and chills.  Gastrointestinal: Positive for nausea and abdominal pain. Negative for vomiting, diarrhea and constipation.  Genitourinary: Positive for vaginal discharge and pelvic pain. Negative for dysuria, urgency and frequency.   Physical Exam   Blood pressure 126/75, pulse 88, temperature 97.9 F (36.6 C), temperature source Oral, resp. rate 18, last menstrual period 03/28/2015.  Physical Exam  Nursing note and vitals reviewed. Constitutional: She is oriented to person, place, and time. She appears well-developed and well-nourished. No distress.  HENT:  Head: Normocephalic.  Eyes: Pupils are equal, round, and reactive to light.  Cardiovascular: Normal rate.   Respiratory: Effort normal.  GI: Soft. There is no tenderness. There is no rebound.  Genitourinary:   External: no lesion Vagina: small amount  of thick, white discharge. Tissue erythematous  Cervix: pink, smooth, no CMT Uterus: NSSC Adnexa: NT   Neurological: She is alert and oriented to person, place, and time.  Skin: Skin is warm and dry.  Psychiatric: She has a normal mood and affect.   Results for orders placed or performed during the hospital encounter of 04/13/15 (from the past 24 hour(s))  Urinalysis, Routine w reflex microscopic  (not at [facility])     Status: Abnormal   Collection Time: 04/13/15  5:50 PM  Result Value Ref Range   Color, Urine YELLOW YELLOW   APPearance HAZY (A) CLEAR   Specific Gravity, Urine 1.015 1.005 - 1.030   pH 6.5 5.0 - 8.0   Glucose, UA NEGATIVE NEGATIVE mg/dL   Hgb urine dipstick NEGATIVE NEGATIVE   Bilirubin Urine NEGATIVE NEGATIVE   Ketones, ur NEGATIVE NEGATIVE mg/dL   Protein, ur NEGATIVE NEGATIVE mg/dL   Nitrite NEGATIVE NEGATIVE   Leukocytes, UA NEGATIVE NEGATIVE  Pregnancy, urine POC     Status: None   Collection Time: 04/13/15  6:51 PM  Result Value Ref Range   Preg Test, Ur NEGATIVE NEGATIVE  CBC     Status: None   Collection Time: 04/13/15  8:10 PM  Result Value Ref Range   WBC 6.7 4.0 - 10.5 K/uL   RBC 4.27 3.87 - 5.11 MIL/uL   Hemoglobin 13.6 12.0 - 15.0 g/dL   HCT 16.140.5 09.636.0 - 04.546.0 %   MCV 94.8 78.0 - 100.0 fL   MCH 31.9 26.0 - 34.0 pg   MCHC 33.6 30.0 - 36.0 g/dL   RDW 40.913.2 81.111.5 - 91.415.5 %   Platelets 232 150 - 400 K/uL  Wet prep, genital     Status: Abnormal   Collection Time: 04/13/15  9:15 PM  Result Value Ref Range   Yeast Wet Prep HPF POC PRESENT (A) NONE SEEN   Trich, Wet Prep NONE SEEN NONE SEEN   Clue Cells Wet Prep HPF POC NONE SEEN NONE SEEN   WBC, Wet Prep HPF POC MANY (A) NONE SEEN   Sperm NONE SEEN     MAU Course  Procedures  MDM Patient has had toradol. Reports improvement with the pain.   Assessment and Plan   1. Yeast infection    DC home Comfort measures reviewed  RX: dilfucan #2, mycolog PRN  Return to MAU as needed FU with OB as planned  Follow-up Information    Follow up with HARPER,CHARLES A, MD.   Specialty:  Obstetrics and Gynecology   Why:  If symptoms worsen   Contact information:   [address] [place] [address] [phone]         Tawnya CrookHogan, Hiroki Wint Donovan 04/13/2015, 9:08 PM

## 2015-04-13 NOTE — MAU Note (Addendum)
Lower abd cramping & pressure for last 3-4 days, also vaginal burning & itching.  Recently had IUD removed - around beginning of December.  Also urinary frequency, denies dysuria.

## 2015-04-13 NOTE — Discharge Instructions (Signed)
Monilial Vaginitis Vaginitis in a soreness, swelling and redness (inflammation) of the vagina and vulva. Monilial vaginitis is not a sexually transmitted infection. CAUSES  Yeast vaginitis is caused by yeast (candida) that is normally found in your vagina. With a yeast infection, the candida has overgrown in number to a point that upsets the chemical balance. SYMPTOMS   White, thick vaginal discharge.  Swelling, itching, redness and irritation of the vagina and possibly the lips of the vagina (vulva).  Burning or painful urination.  Painful intercourse. DIAGNOSIS  Things that Steffensen contribute to monilial vaginitis are:  Postmenopausal and virginal states.  Pregnancy.  Infections.  Being tired, sick or stressed, especially if you had monilial vaginitis in the past.  Diabetes. Good control will help lower the chance.  Birth control pills.  Tight fitting garments.  Using bubble bath, feminine sprays, douches or deodorant tampons.  Taking certain medications that kill germs (antibiotics).  Sporadic recurrence can occur if you become ill. TREATMENT  Your caregiver will give you medication.  There are several kinds of anti monilial vaginal creams and suppositories specific for monilial vaginitis. For recurrent yeast infections, use a suppository or cream in the vagina 2 times a week, or as directed.  Anti-monilial or steroid cream for the itching or irritation of the vulva Derise also be used. Get your caregiver's permission.  Painting the vagina with methylene blue solution Smedley help if the monilial cream does not work.  Eating yogurt Rozelle help prevent monilial vaginitis. HOME CARE INSTRUCTIONS   Finish all medication as prescribed.  Do not have sex until treatment is completed or after your caregiver tells you it is okay.  Take warm sitz baths.  Do not douche.  Do not use tampons, especially scented ones.  Wear cotton underwear.  Avoid tight pants and panty  hose.  Tell your sexual partner that you have a yeast infection. They should go to their caregiver if they have symptoms such as mild rash or itching.  Your sexual partner should be treated as well if your infection is difficult to eliminate.  Practice safer sex. Use condoms.  Some vaginal medications cause latex condoms to fail. Vaginal medications that harm condoms are:  Cleocin cream.  Butoconazole (Femstat).  Terconazole (Terazol) vaginal suppository.  Miconazole (Monistat) (Belnap be purchased over the counter). SEEK MEDICAL CARE IF:   You have a temperature by mouth above 102 F (38.9 C).  The infection is getting worse after 2 days of treatment.  The infection is not getting better after 3 days of treatment.  You develop blisters in or around your vagina.  You develop vaginal bleeding, and it is not your menstrual period.  You have pain when you urinate.  You develop intestinal problems.  You have pain with sexual intercourse.   This information is not intended to replace advice given to you by your health care provider. Make sure you discuss any questions you have with your health care provider.   Document Released: 01/07/2005 Document Revised: 06/22/2011 Document Reviewed: 10/01/2014 Elsevier Interactive Patient Education 2016 Elsevier Inc[employer]  

## 2015-04-14 LAB — RPR: RPR Ser Ql: NONREACTIVE

## 2015-04-14 LAB — HIV ANTIBODY (ROUTINE TESTING W REFLEX): HIV Screen 4th Generation wRfx: NONREACTIVE

## 2015-04-16 LAB — GC/CHLAMYDIA PROBE AMP (~~LOC~~) NOT AT ARMC
Chlamydia: NEGATIVE
Neisseria Gonorrhea: NEGATIVE

## 2015-04-17 ENCOUNTER — Telehealth: Payer: Self-pay | Admitting: *Deleted

## 2015-04-17 ENCOUNTER — Ambulatory Visit: Payer: Medicaid Other | Admitting: Obstetrics

## 2015-04-17 NOTE — Telephone Encounter (Signed)
Patient is calling- she states her cycle has started and she is having heavy bleeding with cramping. 10:52 Patient states she had to have her IUD removed- she has nothing to control her painful cycles. Cycle has not started- she is cramping. Patient has been sexually active with condom. Patient wants to discuss her plan. Patient states Aleve works short term- but doesn't control the pain completely. Discussed disadvantages of  long term narcotic use and need for a plan- appointment given.

## 2015-04-17 NOTE — Telephone Encounter (Signed)
Patient could not make the appointment today- so she scheduled for next week. She wants to know if she can have something for pain for now?

## 2015-04-18 ENCOUNTER — Encounter: Payer: Self-pay | Admitting: Obstetrics

## 2015-04-18 ENCOUNTER — Ambulatory Visit (INDEPENDENT_AMBULATORY_CARE_PROVIDER_SITE_OTHER): Payer: Medicaid Other | Admitting: Obstetrics

## 2015-04-18 VITALS — BP 115/64 | HR 74 | Temp 98.0°F | Wt 171.0 lb

## 2015-04-18 DIAGNOSIS — N730 Acute parametritis and pelvic cellulitis: Secondary | ICD-10-CM | POA: Diagnosis not present

## 2015-04-18 DIAGNOSIS — B3731 Acute candidiasis of vulva and vagina: Secondary | ICD-10-CM

## 2015-04-18 DIAGNOSIS — N946 Dysmenorrhea, unspecified: Secondary | ICD-10-CM

## 2015-04-18 DIAGNOSIS — B373 Candidiasis of vulva and vagina: Secondary | ICD-10-CM

## 2015-04-18 DIAGNOSIS — R102 Pelvic and perineal pain unspecified side: Secondary | ICD-10-CM

## 2015-04-18 MED ORDER — FLUCONAZOLE 200 MG PO TABS: ORAL_TABLET | ORAL | Status: DC

## 2015-04-18 MED ORDER — METRONIDAZOLE 500 MG PO TABS: 500.0000 mg | ORAL_TABLET | Freq: Two times a day (BID) | ORAL | Status: DC

## 2015-04-18 MED ORDER — DOXYCYCLINE HYCLATE 100 MG PO CAPS: 100.0000 mg | ORAL_CAPSULE | Freq: Two times a day (BID) | ORAL | Status: DC

## 2015-04-18 MED ORDER — OXYCODONE HCL 10 MG PO TABS: 10.0000 mg | ORAL_TABLET | Freq: Four times a day (QID) | ORAL | Status: DC | PRN

## 2015-04-18 NOTE — Progress Notes (Signed)
Patient ID: Marissa Smith, female   DOB: [birth year], 30 y.o.   MRN: [id number]  Chief Complaint  Patient presents with  . Advice Only    Cycle control    HPI Marissa Smith is a 30 y.o. female.  Severe dysmenorrhea after removal of Mirena IUD.     HPI  Past Medical History  Diagnosis Date  . Back pain, chronic   . Depressed bipolar I disorder (HCC)   . Cat allergies   . Seasonal allergies     Past Surgical History  Procedure Laterality Date  . Cholecystectomy  2010    Family History  Problem Relation Age of Onset  . Heart failure Father     Social History Social History  Substance Use Topics  . Smoking status: Light Tobacco Smoker -- 0.25 packs/day    Types: Cigarettes  . Smokeless tobacco: Never Used  . Alcohol Use: No    Allergies  Allergen Reactions  . Darvocet [Propoxyphene N-Acetaminophen] Nausea And Vomiting and Other (See Comments)    Reaction:  Dizziness   . Miconazole Nitrate Swelling and Rash    Current Outpatient Prescriptions  Medication Sig Dispense Refill  . ibuprofen (ADVIL,MOTRIN) 800 MG tablet Take 800 mg by mouth every 8 (eight) hours as needed.    . doxycycline (VIBRAMYCIN) 100 MG capsule Take 1 capsule (100 mg total) by mouth 2 (two) times daily. 14 capsule 2  . fluconazole (DIFLUCAN) 200 MG tablet Take 1 tablet every other day. 3 tablet 2  . metroNIDAZOLE (FLAGYL) 500 MG tablet Take 1 tablet (500 mg total) by mouth 2 (two) times daily. 14 tablet 2  . Oxycodone HCl 10 MG TABS Take 1 tablet (10 mg total) by mouth every 6 (six) hours as needed. 40 tablet 0  . Prenat-FePoly-Metf-FA-DHA-DSS (VITAFOL FE+) 90-1-200 & 50 MG CPPK Take 1 capsule by mouth daily before breakfast. (Patient not taking: Reported on 04/18/2015) 30 each 11   No current facility-administered medications for this visit.    Review of Systems Review of Systems Constitutional: negative for fatigue and weight loss Respiratory: negative for cough and wheezing Cardiovascular:  negative for chest pain, fatigue and palpitations Gastrointestinal: negative for abdominal pain and change in bowel habits Genitourinary: positive for severe dysmenorrhea Integument/breast: negative for nipple discharge Musculoskeletal:negative for myalgias Neurological: negative for gait problems and tremors Behavioral/Psych: negative for abusive relationship, depression Endocrine: negative for temperature intolerance     Blood pressure 115/64, pulse 74, temperature 98 F (36.7 C), weight 171 lb (77.565 kg), last menstrual period 03/28/2015.  Physical Exam Physical Exam:  Deferred  100% of 10 min visit spent on counseling and coordination of care.   Data Reviewed Labs  Assessment     Severe Dysmenorrhea  Possible ascending uterine infection / PID  Candida vaginitis     Plan   Oxycodone Rx.  Continue Ibuprofen   Doxy / Flagyl Rx    Diflucan   F/U in 1 week for Mirena IUD    No orders of the defined types were placed in this encounter.   Meds ordered this encounter  Medications  . ibuprofen (ADVIL,MOTRIN) 800 MG tablet    Sig: Take 800 mg by mouth every 8 (eight) hours as needed.  . doxycycline (VIBRAMYCIN) 100 MG capsule    Sig: Take 1 capsule (100 mg total) by mouth 2 (two) times daily.    Dispense:  14 capsule    Refill:  2  . metroNIDAZOLE (FLAGYL) 500 MG tablet  Sig: Take 1 tablet (500 mg total) by mouth 2 (two) times daily.    Dispense:  14 tablet    Refill:  2  . DISCONTD: Oxycodone HCl 10 MG TABS    Sig: Take 1 tablet (10 mg total) by mouth every 6 (six) hours as needed.    Dispense:  40 tablet    Refill:  0  . fluconazole (DIFLUCAN) 200 MG tablet    Sig: Take 1 tablet every other day.    Dispense:  3 tablet    Refill:  2  . Oxycodone HCl 10 MG TABS    Sig: Take 1 tablet (10 mg total) by mouth every 6 (six) hours as needed.    Dispense:  40 tablet    Refill:  0

## 2015-04-25 ENCOUNTER — Ambulatory Visit: Payer: Medicaid Other | Admitting: Obstetrics

## 2015-05-08 ENCOUNTER — Emergency Department (HOSPITAL_COMMUNITY)
Admission: EM | Admit: 2015-05-08 | Discharge: 2015-05-08 | Disposition: A | Discharge status: Home / Self Care | Payer: Medicaid Other | Attending: Emergency Medicine | Admitting: Emergency Medicine

## 2015-05-08 ENCOUNTER — Encounter (HOSPITAL_COMMUNITY): Payer: Self-pay | Admitting: *Deleted

## 2015-05-08 ENCOUNTER — Emergency Department (HOSPITAL_COMMUNITY): Payer: Medicaid Other

## 2015-05-08 DIAGNOSIS — M545 Low back pain, unspecified: Secondary | ICD-10-CM

## 2015-05-08 DIAGNOSIS — Y998 Other external cause status: Secondary | ICD-10-CM | POA: Insufficient documentation

## 2015-05-08 DIAGNOSIS — W11XXXA Fall on and from ladder, initial encounter: Secondary | ICD-10-CM | POA: Insufficient documentation

## 2015-05-08 DIAGNOSIS — F17210 Nicotine dependence, cigarettes, uncomplicated: Secondary | ICD-10-CM | POA: Insufficient documentation

## 2015-05-08 DIAGNOSIS — F319 Bipolar disorder, unspecified: Secondary | ICD-10-CM | POA: Insufficient documentation

## 2015-05-08 DIAGNOSIS — S3992XA Unspecified injury of lower back, initial encounter: Secondary | ICD-10-CM | POA: Insufficient documentation

## 2015-05-08 DIAGNOSIS — Y9289 Other specified places as the place of occurrence of the external cause: Secondary | ICD-10-CM | POA: Insufficient documentation

## 2015-05-08 DIAGNOSIS — G8929 Other chronic pain: Secondary | ICD-10-CM | POA: Diagnosis not present

## 2015-05-08 DIAGNOSIS — S8992XA Unspecified injury of left lower leg, initial encounter: Secondary | ICD-10-CM | POA: Diagnosis not present

## 2015-05-08 DIAGNOSIS — Z792 Long term (current) use of antibiotics: Secondary | ICD-10-CM | POA: Diagnosis not present

## 2015-05-08 DIAGNOSIS — Y9389 Activity, other specified: Secondary | ICD-10-CM | POA: Diagnosis not present

## 2015-05-08 DIAGNOSIS — M25562 Pain in left knee: Secondary | ICD-10-CM

## 2015-05-08 DIAGNOSIS — Z79899 Other long term (current) drug therapy: Secondary | ICD-10-CM | POA: Diagnosis not present

## 2015-05-08 MED ORDER — KETOROLAC TROMETHAMINE 30 MG/ML IJ SOLN
30.0000 mg | Freq: Once | INTRAMUSCULAR | Status: AC
  Administered 2015-05-08: 30 mg via INTRAMUSCULAR
  Filled 2015-05-08: qty 1

## 2015-05-08 NOTE — ED Notes (Signed)
Pt reports she missed a step on a ladder on Tuesday while working on a ceiling . Pt now has increased pain to LT knee and back from fall.

## 2015-05-08 NOTE — ED Provider Notes (Signed)
CSN: [id number]     Arrival date & time 05/08/15  0750 History   First MD Initiated Contact with Patient 05/08/15 [phone]     Chief Complaint  Patient presents with  . Knee Pain  . Back Pain   HPI 30 year old female presents with knee and back pain. Patient reports that she was standing on a ladder when she stumbled and fell landing on the left knee and posterior hip. She reports at that time she had knee pain and bilateral lower back pain worse on the left. Patient reports that she was able to ambulate without difficulty, using ibuprofen as needed. She reports pain in both the need to back continue to persist through the evening and have not improved today. She denies any specific signs of trauma from the fall, reports that she has chronic lower back pain and this "just made a little worse". She denies any neurological deficits, loss of distal sensation strength or motor function. Patient has full active range of motion of the back hip knee ankle. Patient has no red flags for back pain.  Past Medical History  Diagnosis Date  . Back pain, chronic   . Depressed bipolar I disorder (HCC)   . Cat allergies   . Seasonal allergies    Past Surgical History  Procedure Laterality Date  . Cholecystectomy  2010   Family History  Problem Relation Age of Onset  . Heart failure Father    Social History  Substance Use Topics  . Smoking status: Light Tobacco Smoker -- 0.25 packs/day    Types: Cigarettes  . Smokeless tobacco: Never Used  . Alcohol Use: No   OB History    Gravida Para Term Preterm AB TAB SAB Ectopic Multiple Living   Review of Systems  All other systems reviewed and are negative.     Allergies  Darvocet and Miconazole nitrate  Home Medications   Prior to Admission medications   Medication Sig Start Date End Date Taking? Authorizing Provider  doxycycline (VIBRAMYCIN) 100 MG capsule Take 1 capsule (100 mg total) by mouth 2 (two) times daily. 04/18/15    Brock Bad, MD  fluconazole (DIFLUCAN) 200 MG tablet Take 1 tablet every other day. 04/18/15   Brock Bad, MD  ibuprofen (ADVIL,MOTRIN) 800 MG tablet Take 800 mg by mouth every 8 (eight) hours as needed.    Historical Provider, MD  metroNIDAZOLE (FLAGYL) 500 MG tablet Take 1 tablet (500 mg total) by mouth 2 (two) times daily. 04/18/15   Brock Bad, MD  Oxycodone HCl 10 MG TABS Take 1 tablet (10 mg total) by mouth every 6 (six) hours as needed. 04/18/15   Brock Bad, MD  Prenat-FePoly-Metf-FA-DHA-DSS (VITAFOL FE+) 90-1-200 & 50 MG CPPK Take 1 capsule by mouth daily before breakfast. Patient not taking: Reported on 04/18/2015 10/16/14   Brock Bad, MD   BP 117/66 mmHg  Pulse 95  Temp(Src) 97.5 F (36.4 C) (Oral)  Resp 18  SpO2 98%  LMP 04/30/2015   Physical Exam  Constitutional: She is oriented to person, place, and time. She appears well-developed and well-nourished. No distress.  HENT:  Head: Normocephalic and atraumatic.  Eyes: Conjunctivae are normal. Pupils are equal, round, and reactive to light. Right eye exhibits no discharge. Left eye exhibits no discharge. No scleral icterus.  Neck: Normal range of motion. Neck supple. No JVD present. No tracheal deviation present.  Pulmonary/Chest:  Effort normal. No stridor.  Musculoskeletal: Normal range of motion. She exhibits tenderness. She exhibits no edema.  No C, T, or L spine tenderness to palpation. No obvious signs of trauma, deformity, infection, step-offs. Lung expansion normal. No scoliosis or kyphosis. Bilateral lower extremity strength 5 out of 5, sensation grossly intact, patellar reflexes 2+, pedal pulses 2+, Refill less than 3 seconds.  Very minor tenderness to palpation of the soft tissue surrounding the lumbar region, no focal tenderness  Left knee atraumatic, tenderness to palpation of the suprapatellar and lateral regions, patellar grind negative, pain with valgus varus, light palpation, anterior and  posterior drawer. No laxity noted on any of the exams,   Straight leg negative  Ambulates with minimal difficulty   Neurological: She is alert and oriented to person, place, and time. Coordination normal.  Skin: Skin is warm and dry. She is not diaphoretic.  Psychiatric: She has a normal mood and affect. Her behavior is normal. Judgment and thought content normal.  Nursing note and vitals reviewed.   ED Course  Procedures (including critical care time) Labs Review Labs Reviewed - No data to display  Imaging Review Dg Knee Complete 4 Views Left  05/08/2015  CLINICAL DATA:  Fall.  Initial evaluation. EXAM: LEFT KNEE - COMPLETE 4+ VIEW COMPARISON:  07/11/2013 . FINDINGS: No acute bony or joint abnormality identified. No evidence of fracture or dislocation. IMPRESSION: No acute abnormality. Electronically Signed   By: Maisie Fus  Register   On: 05/08/2015 08:37   I have personally reviewed and evaluated these images and lab results as part of my medical decision-making.   EKG Interpretation None      MDM   Final diagnoses:  Bilateral low back pain without sciatica  Left knee pain    Labs:  Imaging: DG complete left  Consults:  Therapeutics:  Discharge Meds:   Assessment/Plan: 30 year old female status post fall. She has no signs of trauma on exam, in no acute distress. She is given a dose of Toradol here, and given symptomatic care instructions for home. Patient has no red flags that would necessitate further evaluation or management here in the ED. Her knee x-ray showed no significant findings. Patient has a brace at home she is instructed use this, follow up with primary care symptoms continue to persist beyond one week. Patient given strict return precautions, verbalized understanding and agreement with today's plan and had no further questions or concerns at time of discharge.         Eyvonne Mechanic, PA-C 05/08/15 1191  Benjiman Core, MD 05/09/15 [phone]

## 2015-05-08 NOTE — ED Notes (Signed)
Declined W/C at D/C and was escorted to lobby by RN. 

## 2015-05-08 NOTE — Discharge Instructions (Signed)
Back Pain, Adult °Back pain is very common in adults. The cause of back pain is rarely dangerous and the pain often gets better over time. The cause of your back pain [place] not be known. Some common causes of back pain include: °· Strain of the muscles or ligaments supporting the spine. °· Wear and tear (degeneration) of the spinal disks. °· Arthritis. °· Direct injury to the back. °For many people, back pain [place] return. Since back pain is rarely dangerous, most people can learn to manage this condition on their own. °HOME CARE INSTRUCTIONS °Watch your back pain for any changes. The following actions [place] help to lessen any discomfort you are feeling: °· Remain active. It is stressful on your back to sit or stand in one place for long periods of time. Do not sit, drive, or stand in one place for more than 30 minutes at a time. Take short walks on even surfaces as soon as you are able. Try to increase the length of time you walk each day. °· Exercise regularly as directed by your health care provider. Exercise helps your back heal faster. It also helps avoid future injury by keeping your muscles strong and flexible. °· Do not stay in bed. Resting more than 1-2 days can delay your recovery. °· Pay attention to your body when you bend and lift. The most comfortable positions are those that put less stress on your recovering back. Always use proper lifting techniques, including: °¨ Bending your knees. °¨ Keeping the load close to your body. °¨ Avoiding twisting. °· Find a comfortable position to sleep. Use a firm mattress and lie on your side with your knees slightly bent. If you lie on your back, put a pillow under your knees. °· Avoid feeling anxious or stressed. Stress increases muscle tension and can worsen back pain. It is important to recognize when you are anxious or stressed and learn ways to manage it, such as with exercise. °· Take medicines only as directed by your health care provider. Over-the-counter  medicines to reduce pain and inflammation are often the most helpful. Your health care provider [place] prescribe muscle relaxant drugs. These medicines help dull your pain so you can more quickly return to your normal activities and healthy exercise. °· Apply ice to the injured area: °¨ Put ice in a plastic bag. °¨ Place a towel between your skin and the bag. °¨ Leave the ice on for 20 minutes, 2-3 times a day for the first 2-3 days. After that, ice and heat [place] be alternated to reduce pain and spasms. °· Maintain a healthy weight. Excess weight puts extra stress on your back and makes it difficult to maintain good posture. °SEEK MEDICAL CARE IF: °· You have pain that is not relieved with rest or medicine. °· You have increasing pain going down into the legs or buttocks. °· You have pain that does not improve in one week. °· You have night pain. °· You lose weight. °· You have a fever or chills. °SEEK IMMEDIATE MEDICAL CARE IF:  °· You develop new bowel or bladder control problems. °· You have unusual weakness or numbness in your arms or legs. °· You develop nausea or vomiting. °· You develop abdominal pain. °· You feel faint. °  °This information is not intended to replace advice given to you by your health care provider. Make sure you discuss any questions you have with your health care provider. °  °Document Released: 03/30/2005 Document Revised: 04/20/2014 Document Reviewed: 08/01/2013 °Elsevier Interactive Patient Education ©2016 Else[employer]   Inc. ° °

## 2015-05-23 ENCOUNTER — Telehealth: Payer: Self-pay | Admitting: *Deleted

## 2015-05-23 NOTE — Telephone Encounter (Signed)
Patient interested in the Mirena IUD for contraception.Patient states she was told by Dr. Clearance Coots to contact the office on the first day of her cycle. Patient states she is due for a cycle in the next week or so. Patient will be call ing th office on the first day of her cycle. Will schedule appointment when patient calls.

## 2015-05-24 ENCOUNTER — Emergency Department (HOSPITAL_COMMUNITY): Payer: Medicaid Other

## 2015-05-24 ENCOUNTER — Emergency Department (HOSPITAL_COMMUNITY)
Admission: EM | Admit: 2015-05-24 | Discharge: 2015-05-24 | Disposition: A | Discharge status: Home / Self Care | Payer: Medicaid Other | Attending: Emergency Medicine | Admitting: Emergency Medicine

## 2015-05-24 ENCOUNTER — Encounter (HOSPITAL_COMMUNITY): Payer: Self-pay | Admitting: Emergency Medicine

## 2015-05-24 DIAGNOSIS — Z79899 Other long term (current) drug therapy: Secondary | ICD-10-CM | POA: Insufficient documentation

## 2015-05-24 DIAGNOSIS — Z3202 Encounter for pregnancy test, result negative: Secondary | ICD-10-CM | POA: Diagnosis not present

## 2015-05-24 DIAGNOSIS — R1013 Epigastric pain: Secondary | ICD-10-CM | POA: Diagnosis not present

## 2015-05-24 DIAGNOSIS — R1011 Right upper quadrant pain: Secondary | ICD-10-CM | POA: Diagnosis not present

## 2015-05-24 DIAGNOSIS — G8929 Other chronic pain: Secondary | ICD-10-CM | POA: Diagnosis not present

## 2015-05-24 DIAGNOSIS — F17210 Nicotine dependence, cigarettes, uncomplicated: Secondary | ICD-10-CM | POA: Diagnosis not present

## 2015-05-24 DIAGNOSIS — R1012 Left upper quadrant pain: Secondary | ICD-10-CM | POA: Insufficient documentation

## 2015-05-24 LAB — URINALYSIS, ROUTINE W REFLEX MICROSCOPIC
Bilirubin Urine: NEGATIVE
Glucose, UA: NEGATIVE mg/dL
Ketones, ur: NEGATIVE mg/dL
Leukocytes, UA: NEGATIVE
Nitrite: NEGATIVE
Protein, ur: NEGATIVE mg/dL
Specific Gravity, Urine: 1.026 (ref 1.005–1.030)
pH: 5.5 (ref 5.0–8.0)

## 2015-05-24 LAB — CBC WITH DIFFERENTIAL/PLATELET
Basophils Absolute: 0 10*3/uL (ref 0.0–0.1)
Basophils Relative: 0 %
Eosinophils Absolute: 0.2 10*3/uL (ref 0.0–0.7)
Eosinophils Relative: 5 %
HCT: 38.7 % (ref 36.0–46.0)
Hemoglobin: 12.9 g/dL (ref 12.0–15.0)
Lymphocytes Relative: 17 %
Lymphs Abs: 0.9 10*3/uL (ref 0.7–4.0)
MCH: 31.1 pg (ref 26.0–34.0)
MCHC: 33.3 g/dL (ref 30.0–36.0)
MCV: 93.3 fL (ref 78.0–100.0)
Monocytes Absolute: 0.7 10*3/uL (ref 0.1–1.0)
Monocytes Relative: 14 %
Neutro Abs: 3.3 10*3/uL (ref 1.7–7.7)
Neutrophils Relative %: 64 %
Platelets: 196 10*3/uL (ref 150–400)
RBC: 4.15 MIL/uL (ref 3.87–5.11)
RDW: 12.7 % (ref 11.5–15.5)
WBC: 5.1 10*3/uL (ref 4.0–10.5)

## 2015-05-24 LAB — URINE MICROSCOPIC-ADD ON

## 2015-05-24 LAB — COMPREHENSIVE METABOLIC PANEL
ALT: 17 U/L (ref 14–54)
AST: 20 U/L (ref 15–41)
Albumin: 3.4 g/dL — ABNORMAL LOW (ref 3.5–5.0)
Alkaline Phosphatase: 36 U/L — ABNORMAL LOW (ref 38–126)
Anion gap: 10 (ref 5–15)
BUN: 9 mg/dL (ref 6–20)
CO2: 23 mmol/L (ref 22–32)
Calcium: 8.6 mg/dL — ABNORMAL LOW (ref 8.9–10.3)
Chloride: 106 mmol/L (ref 101–111)
Creatinine, Ser: 0.68 mg/dL (ref 0.44–1.00)
GFR calc Af Amer: >60 mL/min (ref >60)
GFR calc non Af Amer: >60 mL/min (ref >60)
Glucose, Bld: 90 mg/dL (ref 65–99)
Potassium: 3.6 mmol/L (ref 3.5–5.1)
Sodium: 139 mmol/L (ref 135–145)
Total Bilirubin: 0.5 mg/dL (ref 0.3–1.2)
Total Protein: 5.9 g/dL — ABNORMAL LOW (ref 6.5–8.1)

## 2015-05-24 LAB — POC URINE PREG, ED: Preg Test, Ur: NEGATIVE

## 2015-05-24 LAB — LIPASE, BLOOD: Lipase: 31 U/L (ref 11–51)

## 2015-05-24 MED ORDER — RANITIDINE HCL 150 MG/10ML PO SYRP
150.0000 mg | ORAL_SOLUTION | Freq: Once | ORAL | Status: AC
  Administered 2015-05-24: 150 mg via ORAL
  Filled 2015-05-24: qty 10

## 2015-05-24 MED ORDER — HYDROMORPHONE HCL 1 MG/ML IJ SOLN
1.0000 mg | Freq: Once | INTRAMUSCULAR | Status: AC
  Administered 2015-05-24: 1 mg via INTRAVENOUS
  Filled 2015-05-24: qty 1

## 2015-05-24 MED ORDER — ONDANSETRON HCL 4 MG/2ML IJ SOLN
4.0000 mg | Freq: Once | INTRAMUSCULAR | Status: AC
  Administered 2015-05-24: 4 mg via INTRAVENOUS
  Filled 2015-05-24: qty 2

## 2015-05-24 MED ORDER — GI COCKTAIL ~~LOC~~
30.0000 mL | Freq: Once | ORAL | Status: AC
  Administered 2015-05-24: 30 mL via ORAL
  Filled 2015-05-24: qty 30

## 2015-05-24 MED ORDER — SODIUM CHLORIDE 0.9 % IV SOLN
INTRAVENOUS | Status: DC
  Administered 2015-05-24: 11:00:00 via INTRAVENOUS

## 2015-05-24 MED ORDER — RANITIDINE HCL 15 MG/ML PO SYRP: 75.0000 mg | ORAL_SOLUTION | Freq: Two times a day (BID) | ORAL | Status: DC

## 2015-05-24 MED ORDER — HYDROCODONE-ACETAMINOPHEN 5-325 MG PO TABS: 1.0000 | ORAL_TABLET | Freq: Four times a day (QID) | ORAL | Status: DC | PRN

## 2015-05-24 MED ORDER — PANTOPRAZOLE SODIUM 20 MG PO TBEC: 20.0000 mg | DELAYED_RELEASE_TABLET | Freq: Every day | ORAL | Status: DC

## 2015-05-24 MED ORDER — IOHEXOL 300 MG/ML  SOLN
100.0000 mL | Freq: Once | INTRAMUSCULAR | Status: AC | PRN
  Administered 2015-05-24: 100 mL via INTRAVENOUS

## 2015-05-24 MED ORDER — SODIUM CHLORIDE 0.9 % IV BOLUS (SEPSIS)
1000.0000 mL | Freq: Once | INTRAVENOUS | Status: AC
  Administered 2015-05-24: 1000 mL via INTRAVENOUS

## 2015-05-24 NOTE — ED Notes (Signed)
Pt is aware urine is needed for testing. Pt is unable to urinate at this time.  

## 2015-05-24 NOTE — ED Notes (Signed)
Patient complains of "sharp cramping pain" in her abdomen since last night.  Patient alert and oriented and in no apparent distress at this time.

## 2015-05-24 NOTE — ED Provider Notes (Signed)
CSN: [id number]     Arrival date & time 05/24/15  0808 History   First MD Initiated Contact with Patient 05/24/15 [phone]     Chief Complaint  Patient presents with  . Abdominal Pain     (Consider location/radiation/quality/duration/timing/severity/associated sxs/prior Treatment) Patient is a 30 y.o. female presenting with abdominal pain.  Abdominal Pain Pain location:  Epigastric, RUQ and LUQ Pain quality: aching and sharp   Pain radiates to:  Does not radiate Pain severity:  Mild Onset quality:  Gradual Duration:  12 hours Progression:  Waxing and waning Chronicity:  New Context: not alcohol use, not eating and not recent illness   Relieved by:  None tried Worsened by:  Nothing tried Ineffective treatments:  None tried Associated symptoms: no chest pain, no chills, no cough, no dysuria, no fever, no hematuria and no shortness of breath    30 year old female presents to the emergency department today with epigastric and right upper quadrant left upper quadrant abdominal pain that started last night has been waxing and waning since then. Describes it as crampy and sharp does not radiate anywhere. When its at its worst is 10 out of 10 in severity but then able decrease in the proximate 5 out of 10 in severity. It's worse when she moves around but [place] better by certain positions. She's had some nausea yesterday but no vomiting. She went episode of watery diarrhea with no blood or anything else in it. At this time she is not nauseated she moves too much. No urinary symptoms and is currently on her menstrual cycle.  Past Medical History  Diagnosis Date  . Back pain, chronic   . Depressed bipolar I disorder (HCC)   . Cat allergies   . Seasonal allergies    Past Surgical History  Procedure Laterality Date  . Cholecystectomy  2010    lap chole   Family History  Problem Relation Age of Onset  . Heart failure Father    Social History  Substance Use Topics  . Smoking status: Light  Tobacco Smoker -- 0.25 packs/day    Types: Cigarettes  . Smokeless tobacco: Never Used  . Alcohol Use: No   OB History    Gravida Para Term Preterm AB TAB SAB Ectopic Multiple Living   Review of Systems  Constitutional: Negative for fever and chills.  Respiratory: Negative for cough and shortness of breath.   Cardiovascular: Negative for chest pain.  Gastrointestinal: Positive for abdominal pain.  Genitourinary: Negative for dysuria, hematuria, difficulty urinating, menstrual problem and dyspareunia.  Musculoskeletal: Negative for back pain.  All other systems reviewed and are negative.     Allergies  Darvocet and Miconazole nitrate  Home Medications   Prior to Admission medications   Medication Sig Start Date End Date Taking? Authorizing Provider  acetaminophen (TYLENOL) 500 MG tablet Take 500-1,000 mg by mouth 2 (two) times daily as needed for moderate pain.   Yes Historical Provider, MD  ibuprofen (ADVIL,MOTRIN) 800 MG tablet Take 800 mg by mouth every 8 (eight) hours as needed for moderate pain.    Yes Historical Provider, MD  Prenat-FePoly-Metf-FA-DHA-DSS (VITAFOL FE+) 90-1-200 & 50 MG CPPK Take 1 capsule by mouth daily before breakfast. 10/16/14  Yes Brock Bad, MD  HYDROcodone-acetaminophen (NORCO) 5-325 MG tablet Take 1-2 tablets by mouth every 6 (six) hours as needed for severe pain. 05/24/15   Marily Memos, MD  pantoprazole (PROTONIX) 20 MG  tablet Take 1 tablet (20 mg total) by mouth daily. 05/24/15   Marily Memos, MD  ranitidine (ZANTAC) 15 MG/ML syrup Take 5 mLs (75 mg total) by mouth 2 (two) times daily. 05/24/15   Marily Memos, MD   BP 91/55 mmHg  Pulse 79  Temp(Src) 98.1 F (36.7 C)  Resp 19  SpO2 99%  LMP 04/30/2015 Physical Exam  Constitutional: She appears well-developed and well-nourished.  HENT:  Head: Normocephalic and atraumatic.  Neck: Normal range of motion.  Cardiovascular: Normal rate and regular rhythm.   Pulmonary/Chest:  No stridor. No respiratory distress.  Abdominal: She exhibits no distension.  Neurological: She is alert.  Nursing note and vitals reviewed.   ED Course  Procedures (including critical care time) Labs Review Labs Reviewed  COMPREHENSIVE METABOLIC PANEL - Abnormal; Notable for the following:    Calcium 8.6 (*)    Total Protein 5.9 (*)    Albumin 3.4 (*)    Alkaline Phosphatase 36 (*)    All other components within normal limits  URINALYSIS, ROUTINE W REFLEX MICROSCOPIC (NOT AT [facility]) - Abnormal; Notable for the following:    Hgb urine dipstick LARGE (*)    All other components within normal limits  URINE MICROSCOPIC-ADD ON - Abnormal; Notable for the following:    Squamous Epithelial / LPF 6-30 (*)    Bacteria, UA FEW (*)    All other components within normal limits  CBC WITH DIFFERENTIAL/PLATELET  LIPASE, BLOOD  POC URINE PREG, ED    Imaging Review Ct Abdomen Pelvis W Contrast  05/24/2015  CLINICAL DATA:  Abdominal pain above the umbilicus with nausea and vomiting since yesterday, history of cholecystectomy EXAM: CT ABDOMEN AND PELVIS WITH CONTRAST TECHNIQUE: Multidetector CT imaging of the abdomen and pelvis was performed using the standard protocol following bolus administration of intravenous contrast. CONTRAST:  OMNIPAQUE IOHEXOL 300 MG/ML  SOLN COMPARISON:  None. FINDINGS: Lower chest:  Negative Hepatobiliary: Status post cholecystectomy. Biliary dilatation involving the intrahepatic and extrahepatic bile ducts likely the result of this. Pancreas: Normal Spleen: Normal Adrenals/Urinary Tract: Normal Stomach/Bowel: The stomach and small bowel are normal. Appendix is normal. Large bowel is normal. Vascular/Lymphatic: Negative Reproductive: No abnormalities appreciated Other: Physiologic volume of free fluid present within the cul-de-sac of the pelvis. Musculoskeletal: No acute findings IMPRESSION: Physiologic volume of free fluid in the pelvis. No other significant  abnormalities. Biliary dilatation is likely the result prior cholecystectomy with common bile duct distended to 14 mm. Electronically Signed   By: Esperanza Heir M.D.   On: 05/24/2015 11:07   I have personally reviewed and evaluated these images and lab results as part of my medical decision-making.   EKG Interpretation None      MDM   Final diagnoses:  Epigastric pain   Epigastric abdominal pain, he has a cholecystectomy multiple years ago no problems since then. Concern today is for pancreatitis versus gastritis versus ulcer. Will start with labs and CT scan and treat symptomatically in the meantime.  Imaging and labs negative for acute pathology. Suspect PUD v gastritis. Symptoms improved with gi cocktail then slowly returned, likely confirming my suspicion.   CT scan with biliary dilatation above would've expected for being a postcholecystectomy so discussed case with gastroenterology this and we could do MRCP however discuss case with patient and she stated that she had to be home with her children and her symptoms have improved and did not want to wait around for that at this time. She says she would  rather follow-up with her primary doctor and gastroenterology however she will return here should any new or worsening symptoms over the weekend.    Marily Memos, MD 05/24/15 [phone]

## 2015-05-24 NOTE — ED Notes (Signed)
Patient states she is unable to provide a urine specimen at this time

## 2015-06-05 ENCOUNTER — Emergency Department (HOSPITAL_COMMUNITY)
Admission: EM | Admit: 2015-06-05 | Discharge: 2015-06-05 | Disposition: A | Discharge status: Home / Self Care | Payer: Medicaid Other | Attending: Emergency Medicine | Admitting: Emergency Medicine

## 2015-06-05 ENCOUNTER — Encounter (HOSPITAL_COMMUNITY): Payer: Self-pay | Admitting: *Deleted

## 2015-06-05 DIAGNOSIS — L03211 Cellulitis of face: Secondary | ICD-10-CM | POA: Diagnosis not present

## 2015-06-05 DIAGNOSIS — Z8659 Personal history of other mental and behavioral disorders: Secondary | ICD-10-CM | POA: Diagnosis not present

## 2015-06-05 DIAGNOSIS — Z79899 Other long term (current) drug therapy: Secondary | ICD-10-CM | POA: Insufficient documentation

## 2015-06-05 DIAGNOSIS — G8929 Other chronic pain: Secondary | ICD-10-CM | POA: Insufficient documentation

## 2015-06-05 DIAGNOSIS — F17210 Nicotine dependence, cigarettes, uncomplicated: Secondary | ICD-10-CM | POA: Insufficient documentation

## 2015-06-05 MED ORDER — IBUPROFEN 800 MG PO TABS: 800.0000 mg | ORAL_TABLET | Freq: Three times a day (TID) | ORAL | Status: DC

## 2015-06-05 MED ORDER — SULFAMETHOXAZOLE-TRIMETHOPRIM 800-160 MG PO TABS: 1.0000 | ORAL_TABLET | Freq: Two times a day (BID) | ORAL | Status: AC

## 2015-06-05 NOTE — ED Provider Notes (Signed)
CSN: [id number]     Arrival date & time 06/05/15  0715 History   First MD Initiated Contact with Patient 06/05/15 [phone]     Chief Complaint  Patient presents with  . Cellulitis     (Consider location/radiation/quality/duration/timing/severity/associated sxs/prior Treatment) HPI Comments: 2 days ago the patient states that she was struck in the face with a thorn from a bush while she was doing yard work. She pulled the thorn out but has had ongoing pain since that time, it is mild to moderate, persistent, gradually worsening, not associated with fevers. She has been applying topical antibiotic ointment without improvement  The history is provided by the patient.    Past Medical History  Diagnosis Date  . Back pain, chronic   . Depressed bipolar I disorder (HCC)   . Cat allergies   . Seasonal allergies    Past Surgical History  Procedure Laterality Date  . Cholecystectomy  2010    lap chole   Family History  Problem Relation Age of Onset  . Heart failure Father    Social History  Substance Use Topics  . Smoking status: Light Tobacco Smoker -- 0.25 packs/day    Types: Cigarettes  . Smokeless tobacco: Never Used  . Alcohol Use: No   OB History    Gravida Para Term Preterm AB TAB SAB Ectopic Multiple Living   0 1 0 0 0 0 3     Review of Systems  Constitutional: Negative for fever.  Skin: Positive for rash.      Allergies  Darvocet and Miconazole nitrate  Home Medications   Prior to Admission medications   Medication Sig Start Date End Date Taking? Authorizing Provider  acetaminophen (TYLENOL) 500 MG tablet Take 500-1,000 mg by mouth 2 (two) times daily as needed for moderate pain.    Historical Provider, MD  HYDROcodone-acetaminophen (NORCO) 5-325 MG tablet Take 1-2 tablets by mouth every 6 (six) hours as needed for severe pain. 05/24/15   Marily Memos, MD  ibuprofen (ADVIL,MOTRIN) 800 MG tablet Take 1 tablet (800 mg total) by mouth 3 (three) times daily.  06/05/15   Eber Hong, MD  pantoprazole (PROTONIX) 20 MG tablet Take 1 tablet (20 mg total) by mouth daily. 05/24/15   Marily Memos, MD  Prenat-FePoly-Metf-FA-DHA-DSS (VITAFOL FE+) 90-1-200 & 50 MG CPPK Take 1 capsule by mouth daily before breakfast. 10/16/14   Brock Bad, MD  ranitidine (ZANTAC) 15 MG/ML syrup Take 5 mLs (75 mg total) by mouth 2 (two) times daily. 05/24/15   Marily Memos, MD  sulfamethoxazole-trimethoprim (BACTRIM DS,SEPTRA DS) 800-160 MG tablet Take 1 tablet by mouth 2 (two) times daily. 06/05/15 06/12/15  Eber Hong, MD   BP 102/57 mmHg  Pulse 62  Temp(Src) 97.8 F (36.6 C) (Oral)  Resp 17  SpO2 100%  LMP 04/30/2015 Physical Exam  Constitutional: She appears well-developed and well-nourished. No distress.  HENT:  Head: Normocephalic and atraumatic.  Eyes: Conjunctivae are normal. Right eye exhibits no discharge. Left eye exhibits no discharge. No scleral icterus.  Cardiovascular: Intact distal pulses.   Pulmonary/Chest: Effort normal.  Neurological: She is alert. Coordination normal.  Skin: Rash noted.  L facial cellulitis - no mass, no abscess, no drainage  Nursing note and vitals reviewed.   ED Course  Procedures (including critical care time) Labs Review Labs Reviewed - No data to display  Imaging Review No results found. I have personally reviewed and evaluated these images and lab results as part of my  medical decision-making.    MDM   Final diagnoses:  Cellulitis, face    The patient appears to have what appears to be a left facial cellulitis, there does not appear to be a foreign body, there is no abscess, I have palpated the cheek and the buccal mucosa and there is no masses, no fluctuant masses, no tender masses and no lymphadenopathy. Vital signs are normal without fever or tachycardia or hypotension, the patient will be given up her prescription for Bactrim and encouraged to follow-up closely, there is no trismus or torticollis to prevent  aching her medications appropriately. She expressed her understanding to the verbal discharge instructions.   Meds given in ED:  Medications - No data to display  New Prescriptions   IBUPROFEN (ADVIL,MOTRIN) 800 MG TABLET    Take 1 tablet (800 mg total) by mouth 3 (three) times daily.   SULFAMETHOXAZOLE-TRIMETHOPRIM (BACTRIM DS,SEPTRA DS) 800-160 MG TABLET    Take 1 tablet by mouth 2 (two) times daily.        Eber Hong, MD 06/05/15 [phone]

## 2015-06-05 NOTE — Discharge Instructions (Signed)
U must have a recheck within 2 days, if you develop severe or worsening pain, swelling or fevers of your face within 24 hours return to the emergency Department immediately.  Please obtain all of your results from medical records or have your doctors office obtain the results - share them with your doctor - you should be seen at your doctors office in the next 2 days. Call today to arrange your follow up. Take the medications as prescribed. Please review all of the medicines and only take them if you do not have an allergy to them. Please be aware that if you are taking birth control pills, taking other prescriptions, ESPECIALLY ANTIBIOTICS [place] make the birth control ineffective - if this is the case, either do not engage in sexual activity or use alternative methods of birth control such as condoms until you have finished the medicine and your family doctor says it is OK to restart them. If you are on a blood thinner such as COUMADIN, be aware that any other medicine that you take [place] cause the coumadin to either work too much, or not enough - you should have your coumadin level rechecked in next 7 days if this is the case.  ?  It is also a possibility that you have an allergic reaction to any of the medicines that you have been prescribed - Everybody reacts differently to medications and while MOST people have no trouble with most medicines, you [place]au have a reaction such as nausea, vomiting, rash, swelling, shortness of breath. If this is the case, please stop taking the medicine immediately and contact your physician.  ?  You should return to the ER if you develop severe or worsening symptoms.

## 2015-06-05 NOTE — ED Notes (Signed)
States she was working in the  [place] was hit in the face with a thorn brush. Started swelling yest and today has gotten worse. Face is red  And swollen

## 2015-06-16 ENCOUNTER — Encounter (HOSPITAL_COMMUNITY): Payer: Self-pay | Admitting: Emergency Medicine

## 2015-06-16 ENCOUNTER — Emergency Department (HOSPITAL_COMMUNITY)
Admission: EM | Admit: 2015-06-16 | Discharge: 2015-06-16 | Disposition: A | Discharge status: Home / Self Care | Payer: Medicaid Other | Attending: Emergency Medicine | Admitting: Emergency Medicine

## 2015-06-16 DIAGNOSIS — R51 Headache: Secondary | ICD-10-CM | POA: Insufficient documentation

## 2015-06-16 DIAGNOSIS — R6889 Other general symptoms and signs: Secondary | ICD-10-CM

## 2015-06-16 DIAGNOSIS — F17210 Nicotine dependence, cigarettes, uncomplicated: Secondary | ICD-10-CM | POA: Insufficient documentation

## 2015-06-16 DIAGNOSIS — Z79899 Other long term (current) drug therapy: Secondary | ICD-10-CM | POA: Diagnosis not present

## 2015-06-16 DIAGNOSIS — R509 Fever, unspecified: Secondary | ICD-10-CM | POA: Diagnosis not present

## 2015-06-16 DIAGNOSIS — Z8639 Personal history of other endocrine, nutritional and metabolic disease: Secondary | ICD-10-CM | POA: Insufficient documentation

## 2015-06-16 DIAGNOSIS — G8929 Other chronic pain: Secondary | ICD-10-CM | POA: Insufficient documentation

## 2015-06-16 DIAGNOSIS — J029 Acute pharyngitis, unspecified: Secondary | ICD-10-CM | POA: Insufficient documentation

## 2015-06-16 LAB — RAPID STREP SCREEN (MED CTR MEBANE ONLY): Streptococcus, Group A Screen (Direct): NEGATIVE

## 2015-06-16 MED ORDER — HYDROMORPHONE HCL 1 MG/ML IJ SOLN
1.0000 mg | Freq: Once | INTRAMUSCULAR | Status: AC
  Administered 2015-06-16: 1 mg via INTRAMUSCULAR
  Filled 2015-06-16: qty 1

## 2015-06-16 MED ORDER — DEXAMETHASONE 1 MG/ML PO CONC
10.0000 mg | Freq: Once | ORAL | Status: AC
  Administered 2015-06-16: 10 mg via ORAL
  Filled 2015-06-16: qty 10

## 2015-06-16 MED ORDER — KETOROLAC TROMETHAMINE 30 MG/ML IJ SOLN
30.0000 mg | Freq: Once | INTRAMUSCULAR | Status: AC
  Administered 2015-06-16: 30 mg via INTRAMUSCULAR
  Filled 2015-06-16: qty 1

## 2015-06-16 NOTE — ED Notes (Signed)
Pt c/o flu like symptoms of headache, sore throat and body aches x 3 days. Pt has tried dayquil/nyquil ibuprofen, medication for sinus.

## 2015-06-16 NOTE — ED Provider Notes (Signed)
CSN: [id number]     Arrival date & time 06/16/15  0806 History   First MD Initiated Contact with Patient 06/16/15 [phone]     Chief Complaint  Patient presents with  . Influenza  . Headache  . Sore Throat  . Generalized Body Aches     (Consider location/radiation/quality/duration/timing/severity/associated sxs/prior Treatment) HPI   30 year old female with flulike symptoms. Symptom onset about 3 days ago. Generalized body aches she feels like "I've been hit by a truck." Sore throat. Headache. Subjective fever. Has been taking over-the-counter cold medications and ibuprofen with some improvement of her body aches. Occasional nonproductive cough. No shortness of breath. No urinary complaints. No sick contacts.  Past Medical History  Diagnosis Date  . Back pain, chronic   . Depressed bipolar I disorder (HCC)   . Cat allergies   . Seasonal allergies    Past Surgical History  Procedure Laterality Date  . Cholecystectomy  2010    lap chole   Family History  Problem Relation Age of Onset  . Heart failure Father    Social History  Substance Use Topics  . Smoking status: Light Tobacco Smoker -- 0.25 packs/day    Types: Cigarettes  . Smokeless tobacco: Never Used  . Alcohol Use: No   OB History    Gravida Para Term Preterm AB TAB SAB Ectopic Multiple Living   0 1 0 0 0 0 3     Review of Systems  All systems reviewed and negative, other than as noted in HPI.   Allergies  Darvocet and Miconazole nitrate  Home Medications   Prior to Admission medications   Medication Sig Start Date End Date Taking? Authorizing Provider  acetaminophen (TYLENOL) 500 MG tablet Take 500-1,000 mg by mouth 2 (two) times daily as needed for moderate pain.    Historical Provider, MD  HYDROcodone-acetaminophen (NORCO) 5-325 MG tablet Take 1-2 tablets by mouth every 6 (six) hours as needed for severe pain. 05/24/15   Marily Memos, MD  ibuprofen (ADVIL,MOTRIN) 800 MG tablet Take 1 tablet (800 mg  total) by mouth 3 (three) times daily. 06/05/15   Eber Hong, MD  pantoprazole (PROTONIX) 20 MG tablet Take 1 tablet (20 mg total) by mouth daily. 05/24/15   Marily Memos, MD  Prenat-FePoly-Metf-FA-DHA-DSS (VITAFOL FE+) 90-1-200 & 50 MG CPPK Take 1 capsule by mouth daily before breakfast. 10/16/14   Brock Bad, MD  ranitidine (ZANTAC) 15 MG/ML syrup Take 5 mLs (75 mg total) by mouth 2 (two) times daily. 05/24/15   Marily Memos, MD   BP 116/65 mmHg  Pulse 83  Temp(Src) 99.2 F (37.3 C) (Oral)  Resp 20  Ht  (1.626 m)  Wt 167 lb (75.751 kg)  BMI 28.65 kg/m2  SpO2 99%  LMP 05/24/2015 Physical Exam  Constitutional: She appears well-developed and well-nourished. No distress.  HENT:  Head: Normocephalic and atraumatic.  Non-exudative pharyngitis. Uvula is midline. Handling secretions. Normal sounding voice. Neck supple. No adenopathy. No stridor.  Eyes: Conjunctivae are normal. Pupils are equal, round, and reactive to light. Right eye exhibits no discharge. Left eye exhibits no discharge.  Neck: Neck supple.  Cardiovascular: Normal rate, regular rhythm and normal heart sounds.  Exam reveals no gallop and no friction rub.   No murmur heard. Pulmonary/Chest: Effort normal and breath sounds normal. No respiratory distress.  Abdominal: Soft. She exhibits no distension. There is no tenderness.  Musculoskeletal: She exhibits no edema or tenderness.  Neurological: She is alert.  Skin:  Skin is warm and dry.  Psychiatric: She has a normal mood and affect. Her behavior is normal. Thought content normal.  Nursing note and vitals reviewed.   ED Course  Procedures (including critical care time) Labs Review Labs Reviewed  RAPID STREP SCREEN (NOT AT [facility])  CULTURE, GROUP A STREP [facility]    Imaging Review No results found. I have personally reviewed and evaluated these images and lab results as part of my medical decision-making.   EKG Interpretation None      MDM   Final  diagnoses:  Flu-like symptoms    30 year old female with flulike symptoms. She is otherwise healthy. She should be fine with symptomatic treatment. She is complaining of a sore throat and does have some pharyngitis on exam. Rapid strep was negative. No evidence of significant airway compromise. Return precautions were discussed. As needed NSAIDs for fever and aches and pains. Plenty of fluids. Plenty of rest.    Raeford RazorStephen Leslye Puccini, MD 06/16/15 [phone]

## 2015-06-17 LAB — CULTURE, GROUP A STREP (THRC)

## 2015-06-18 ENCOUNTER — Telehealth (HOSPITAL_BASED_OUTPATIENT_CLINIC_OR_DEPARTMENT_OTHER): Payer: Self-pay | Admitting: Emergency Medicine

## 2015-06-18 ENCOUNTER — Encounter (HOSPITAL_COMMUNITY): Payer: Self-pay | Admitting: Emergency Medicine

## 2015-06-18 ENCOUNTER — Emergency Department (HOSPITAL_COMMUNITY)
Admission: EM | Admit: 2015-06-18 | Discharge: 2015-06-18 | Disposition: A | Discharge status: Home / Self Care | Payer: Medicaid Other | Attending: Emergency Medicine | Admitting: Emergency Medicine

## 2015-06-18 DIAGNOSIS — F17210 Nicotine dependence, cigarettes, uncomplicated: Secondary | ICD-10-CM | POA: Diagnosis not present

## 2015-06-18 DIAGNOSIS — J029 Acute pharyngitis, unspecified: Secondary | ICD-10-CM | POA: Diagnosis present

## 2015-06-18 DIAGNOSIS — Z79899 Other long term (current) drug therapy: Secondary | ICD-10-CM | POA: Diagnosis not present

## 2015-06-18 DIAGNOSIS — Z8659 Personal history of other mental and behavioral disorders: Secondary | ICD-10-CM | POA: Insufficient documentation

## 2015-06-18 DIAGNOSIS — R630 Anorexia: Secondary | ICD-10-CM | POA: Diagnosis not present

## 2015-06-18 DIAGNOSIS — R51 Headache: Secondary | ICD-10-CM | POA: Insufficient documentation

## 2015-06-18 DIAGNOSIS — Z791 Long term (current) use of non-steroidal anti-inflammatories (NSAID): Secondary | ICD-10-CM | POA: Insufficient documentation

## 2015-06-18 DIAGNOSIS — G8929 Other chronic pain: Secondary | ICD-10-CM | POA: Insufficient documentation

## 2015-06-18 MED ORDER — PENICILLIN G BENZATHINE 1200000 UNIT/2ML IM SUSP
1.2000 10*6.[IU] | Freq: Once | INTRAMUSCULAR | Status: AC
  Administered 2015-06-18: 1.2 10*6.[IU] via INTRAMUSCULAR
  Filled 2015-06-18: qty 2

## 2015-06-18 NOTE — ED Notes (Signed)
Pt states her culture for strep throat came back positive. Pt requesting the antibiotic shot. C/o sore throat.

## 2015-06-18 NOTE — ED Provider Notes (Signed)
CSN: [id number]     Arrival date & time 06/18/15  1159 History  By signing my name below, I, Soijett Blue, attest that this documentation has been prepared under the direction and in the presence of S. Lane Hacker, PA-C Electronically Signed: Soijett Blue, ED Scribe. 06/18/2015. 1:18 PM.   Chief Complaint  Patient presents with  . Sore Throat      The history is provided by the patient. No language interpreter was used.    HPI Comments: Marissa Smith is a 30 y.o. female who presents to the Emergency Department complaining of sore throat x 5 days. Pt was seen in the ED for her symptoms on 06/16/15 and initially had a negative strep. Pt was informed by case management that her strep culture returned positive. Pt returned to the ED today to receive an abx shot. She states that she is having associated symptoms of painful swallowing, HA, and appetite change. She states that she has tried theraflu for the relief for her symptoms. She denies fever, chills, and any other symptoms.    Past Medical History  Diagnosis Date  . Back pain, chronic   . Depressed bipolar I disorder (HCC)   . Cat allergies   . Seasonal allergies    Past Surgical History  Procedure Laterality Date  . Cholecystectomy  2010    lap chole   Family History  Problem Relation Age of Onset  . Heart failure Father    Social History  Substance Use Topics  . Smoking status: Light Tobacco Smoker -- 0.25 packs/day    Types: Cigarettes  . Smokeless tobacco: Never Used  . Alcohol Use: No   OB History    Gravida Para Term Preterm AB TAB SAB Ectopic Multiple Living   0 1 0 0 0 0 3     Review of Systems  Constitutional: Negative for fever and chills.  HENT: Positive for sore throat. Negative for trouble swallowing.   Neurological: Positive for headaches.      Allergies  Darvocet and Miconazole nitrate  Home Medications   Prior to Admission medications   Medication Sig Start Date End Date Taking?  Authorizing Provider  acetaminophen (TYLENOL) 500 MG tablet Take 500-1,000 mg by mouth 2 (two) times daily as needed for moderate pain.    Historical Provider, MD  HYDROcodone-acetaminophen (NORCO) 5-325 MG tablet Take 1-2 tablets by mouth every 6 (six) hours as needed for severe pain. 05/24/15   Marily Memos, MD  ibuprofen (ADVIL,MOTRIN) 800 MG tablet Take 1 tablet (800 mg total) by mouth 3 (three) times daily. 06/05/15   Eber Hong, MD  pantoprazole (PROTONIX) 20 MG tablet Take 1 tablet (20 mg total) by mouth daily. 05/24/15   Marily Memos, MD  Prenat-FePoly-Metf-FA-DHA-DSS (VITAFOL FE+) 90-1-200 & 50 MG CPPK Take 1 capsule by mouth daily before breakfast. 10/16/14   Brock Bad, MD  ranitidine (ZANTAC) 15 MG/ML syrup Take 5 mLs (75 mg total) by mouth 2 (two) times daily. 05/24/15   Marily Memos, MD   BP 111/61 mmHg  Pulse 68  Temp(Src) 98.5 F (36.9 C) (Oral)  Resp 16  Ht  (1.626 m)  Wt 167 lb 3.2 oz (75.841 kg)  BMI 28.69 kg/m2  SpO2 99%  LMP 05/24/2015 Physical Exam  Constitutional: She is oriented to person, place, and time. She appears well-developed and well-nourished. No distress.  HENT:  Head: Normocephalic and atraumatic.  Mouth/Throat: Uvula is midline and mucous membranes are normal. No trismus  in the jaw. No uvula swelling. Posterior oropharyngeal edema and posterior oropharyngeal erythema present. No oropharyngeal exudate or tonsillar abscesses.  Tonsils 3+ on right and 2+ on the left and erythematous.   Eyes: EOM are normal.  Neck: Neck supple.  Cardiovascular: Normal rate.   Pulmonary/Chest: Effort normal. No respiratory distress.  Musculoskeletal: Normal range of motion.  Lymphadenopathy:    She has cervical adenopathy.  Neurological: She is alert and oriented to person, place, and time.  Skin: Skin is warm and dry.  Psychiatric: She has a normal mood and affect. Her behavior is normal.  Nursing note and vitals reviewed.   ED Course  Procedures   DIAGNOSTIC STUDIES: Oxygen Saturation is 99% on RA, nl by my interpretation.    COORDINATION OF CARE: 1:07 PM Discussed treatment plan with pt at bedside which includes penicillin injection and pt agreed to plan.   MDM   Final diagnoses:  Pharyngitis   Pt with tonsillar exudate, cervical lymphadenopathy, & dysphagia; diagnosis of strep. Treated in the Ed with PCN IM.  Pt appears mildly dehydrated, discussed importance of water rehydration. Presentation non concerning for PTA or infxn spread to soft tissue. No trismus or uvula deviation. Specific return precautions discussed. Pt able to drink at home without difficulty with intact air way. Recommended PCP follow up.   I personally performed the services described in this documentation, which was scribed in my presence. The recorded information has been reviewed and is accurate.   Melton KrebsSamantha Nicole Sabastien Tyler, PA-C 06/21/15 1529  Alvira MondayErin Schlossman, MD 06/21/15 [phone]

## 2015-06-18 NOTE — Telephone Encounter (Signed)
Post ED Visit - Positive Culture Follow-up: Successful Patient Follow-Up  Culture assessed and recommendations reviewed by: []  Enzo BiNathan Batchelder, Pharm.D. []  Celedonio MiyamotoJeremy Frens, Pharm.D., BCPS []  Garvin FilaMike Maccia, Pharm.D. []  Georgina PillionElizabeth Martin, Pharm.D., BCPS []  RivertonMinh Pham, [address] Rainbow BoulevardPharm.D., BCPS, AAHIVP []  Estella HuskMichelle Turner, Pharm.D., BCPS, AAHIVP []  Tennis Mustassie Stewart, Pharm.D. []  Sherle Poeob Vincent, [place]arm[place]D. Tollie EthMegan Decker PharmD  Positive strep culture  [x]  Patient discharged without antimicrobial prescription and treatment is now indicated []  Organism is resistant to prescribed ED discharge antimicrobial []  Patient with positive blood cultures  Changes discussed with ED provider: Catha GosselinHanna Patel-Mills PA New antibiotic prescription Start Amoxicilln 500mg  po bid x 10 days [facility]  Contacted patient, 06/18/15 1105   Berle MullMiller, Catilyn Boggus 06/18/2015, 11:04 AM

## 2015-06-18 NOTE — Progress Notes (Signed)
ED Antimicrobial Stewardship Positive Culture Follow Up   Marissa Smith [name] is an 30 y.o. female [facility] with a chief complaint of  Chief Complaint  Patient presents with  . Influenza  . Headache  . Sore Throat  . Generalized Body Aches    Recent Results (from the past 720 hour(s))  Rapid strep screen     Status: None   Collection Time: 06/16/15  8:20 AM  Result Value Ref Range Status   Streptococcus, Group A Screen (Direct) NEGATIVE NEGATIVE Final    Comment: (NOTE) A Rapid Antigen test Lacher result negative if the antigen level in the sample is below the detection level of this test. The FDA has not cleared this test as a stand-alone test therefore the rapid antigen negative result has reflexed to a Group A Strep culture.   Culture, group A strep     Status: None   Collection Time: 06/16/15  8:20 AM  Result Value Ref Range Status   Specimen Description THROAT  Final   Special Requests NONE Reflexed from [id number]  Final   Culture MODERATE GROUP A STREP (S.PYOGENES) ISOLATED  Final   Report Status 06/17/2015 FINAL  Final   [x]  Patient discharged originally without antimicrobial agent and treatment is now indicated  New antibiotic prescription: amoxicillin 500 mg BID x 10 days  ED Provider: Hebert SohoHanna Patel, PA-C   Arcola JanskyMeagan Wandy Bossler, PharmD Clinical Pharmacy Resident Phone# [phone] 06/18/2015 9:18 AM

## 2015-06-18 NOTE — ED Notes (Signed)
Pt seen on Sunday . Pt called to return for treatment of + strep throat.

## 2015-06-18 NOTE — Discharge Instructions (Signed)
[name],  Nice meeting you! Please follow-up with your primary care provider. Return to the emergency department if you develop trouble swallowing, drooling, shortness of breath, chest pain, or do not improve within a few days. Feel better soon!  S. Lane Hacker, PA-C  Strep Throat Strep throat is a bacterial infection of the throat. Your health care provider Gaines call the infection tonsillitis or pharyngitis, depending on whether there is swelling in the tonsils or at the back of the throat. Strep throat is most common during the cold months of the year in children who are 32-32 years of age, but it can happen during any season in people of any age. This infection is spread from person to person (contagious) through coughing, sneezing, or close contact. CAUSES Strep throat is caused by the bacteria called Streptococcus pyogenes. RISK FACTORS This condition is more likely to develop in:  People who spend time in crowded places where the infection can spread easily.  People who have close contact with someone who has strep throat. SYMPTOMS Symptoms of this condition include:  Fever or chills.   Redness, swelling, or pain in the tonsils or throat.  Pain or difficulty when swallowing.  White or yellow spots on the tonsils or throat.  Swollen, tender glands in the neck or under the jaw.  Red rash all over the body (rare). DIAGNOSIS This condition is diagnosed by performing a rapid strep test or by taking a swab of your throat (throat culture test). Results from a rapid strep test are usually ready in a few minutes, but throat culture test results are available after one or two days. TREATMENT This condition is treated with antibiotic medicine. HOME CARE INSTRUCTIONS Medicines  Take over-the-counter and prescription medicines only as told by your health care provider.  Take your antibiotic as told by your health care provider. Do not stop taking the antibiotic even if you  start to feel better.  Have family members who also have a sore throat or fever tested for strep throat. They Pandolfi need antibiotics if they have the strep infection. Eating and Drinking  Do not share food, drinking cups, or personal items that could cause the infection to spread to other people.  If swallowing is difficult, try eating soft foods until your sore throat feels better.  Drink enough fluid to keep your urine clear or pale yellow. General Instructions  Gargle with a salt-water mixture 3-4 times per day or as needed. To make a salt-water mixture, completely dissolve -1 tsp of salt in 1 cup of warm water.  Make sure that all household members wash their hands well.  Get plenty of rest.  Stay home from school or work until you have been taking antibiotics for 24 hours.  Keep all follow-up visits as told by your health care provider. This is important. SEEK MEDICAL CARE IF:  The glands in your neck continue to get bigger.  You develop a rash, cough, or earache.  You cough up a thick liquid that is green, yellow-brown, or bloody.  You have pain or discomfort that does not get better with medicine.  Your problems seem to be getting worse rather than better.  You have a fever. SEEK IMMEDIATE MEDICAL CARE IF:  You have new symptoms, such as vomiting, severe headache, stiff or painful neck, chest pain, or shortness of breath.  You have severe throat pain, drooling, or changes in your voice.  You have swelling of the neck, or the skin  on the neck becomes red and tender.  You have signs of dehydration, such as fatigue, dry mouth, and decreased urination.  You become increasingly sleepy, or you cannot wake up completely.  Your joints become red or painful.   This information is not intended to replace advice given to you by your health care provider. Make sure you discuss any questions you have with your health care provider.   Document Released: 03/27/2000 Document  Revised: 12/19/2014 Document Reviewed: 07/23/2014 Elsevier Interactive Patient Education [employer]2016 Elsevier Inc.

## 2015-06-18 NOTE — ED Notes (Signed)
Declined W/C at D/C and was escorted to lobby by RN. 

## 2015-07-23 ENCOUNTER — Ambulatory Visit (HOSPITAL_COMMUNITY)
Admission: EM | Admit: 2015-07-23 | Discharge: 2015-07-23 | Disposition: A | Discharge status: Home / Self Care | Payer: Medicaid Other | Attending: Family Medicine | Admitting: Family Medicine

## 2015-07-23 ENCOUNTER — Encounter (HOSPITAL_COMMUNITY): Payer: Self-pay | Admitting: Emergency Medicine

## 2015-07-23 ENCOUNTER — Ambulatory Visit (INDEPENDENT_AMBULATORY_CARE_PROVIDER_SITE_OTHER): Payer: Medicaid Other

## 2015-07-23 DIAGNOSIS — M79645 Pain in left finger(s): Secondary | ICD-10-CM | POA: Diagnosis not present

## 2015-07-23 MED ORDER — HYDROCODONE-ACETAMINOPHEN 5-325 MG PO TABS: 1.0000 | ORAL_TABLET | Freq: Four times a day (QID) | ORAL | Status: DC | PRN

## 2015-07-23 NOTE — ED Notes (Signed)
[facility] a [facility] injury to her left thumb that occurred today when it got caught in the pull cord of a pressure washer.

## 2015-07-23 NOTE — Discharge Instructions (Signed)
Ice , splint and use pain medicine or advil as needed, see orthopedist if further problems.

## 2015-07-23 NOTE — ED Provider Notes (Signed)
CSN: [id number]     Arrival date & time 07/23/15  1929 History   None    Chief Complaint  Patient presents with  . Finger Injury   (Consider location/radiation/quality/duration/timing/severity/associated sxs/prior Treatment) Patient is a 30 y.o. female presenting with hand pain. The history is provided by the patient.  Hand Pain This is a new problem. The current episode started 6 to 12 hours ago (pulling cord on pressure washer and recoil jerked thumb on leftwith dp joint pain.). The problem has been gradually worsening.    Past Medical History  Diagnosis Date  . Back pain, chronic   . Depressed bipolar I disorder (HCC)   . Cat allergies   . Seasonal allergies    Past Surgical History  Procedure Laterality Date  . Cholecystectomy  2010    lap chole   Family History  Problem Relation Age of Onset  . Heart failure Father    Social History  Substance Use Topics  . Smoking status: Light Tobacco Smoker -- 0.25 packs/day    Types: Cigarettes  . Smokeless tobacco: Never Used  . Alcohol Use: No   OB History    Gravida Para Term Preterm AB TAB SAB Ectopic Multiple Living   7 3 3  0 1 0 0 0 0 3     Review of Systems  Constitutional: Negative.   Musculoskeletal: Negative for joint swelling.  Skin: Negative.   All other systems reviewed and are negative.   Allergies  Darvocet and Miconazole nitrate  Home Medications   Prior to Admission medications   Medication Sig Start Date End Date Taking? Authorizing Provider  acetaminophen (TYLENOL) 500 MG tablet Take 500-1,000 mg by mouth 2 (two) times daily as needed for moderate pain.    Historical Provider, MD  HYDROcodone-acetaminophen (NORCO/VICODIN) 5-325 MG tablet Take 1 tablet by mouth every 6 (six) hours as needed. 07/23/15   Linna HoffJames D Valeriano Bain, MD  ibuprofen (ADVIL,MOTRIN) 800 MG tablet Take 1 tablet (800 mg total) by mouth 3 (three) times daily. 06/05/15   Eber HongBrian Miller, MD  pantoprazole (PROTONIX) 20 MG tablet Take 1 tablet (20  mg total) by mouth daily. 05/24/15   Marily MemosJason Mesner, MD  Prenat-FePoly-Metf-FA-DHA-DSS (VITAFOL FE+) 90-1-200 & 50 MG CPPK Take 1 capsule by mouth daily before breakfast. 10/16/14   Brock Badharles A Harper, MD  ranitidine (ZANTAC) 15 MG/ML syrup Take 5 mLs (75 mg total) by mouth 2 (two) times daily. 05/24/15   Marily MemosJason Mesner, MD   Meds Ordered and Administered this Visit  Medications - No data to display  BP 105/64 mmHg  Pulse 66  Temp(Src) 98.7 F (37.1 C) (Oral)  SpO2 100%  LMP 07/18/2015 (Exact Date) No data found.   Physical Exam  Constitutional: She is oriented to person, place, and time. She appears well-developed and well-nourished.  Musculoskeletal: She exhibits tenderness.       Left hand: She exhibits tenderness, bony tenderness and swelling. She exhibits normal capillary refill and no deformity.       Hands: Neurological: She is alert and oriented to person, place, and time.  Skin: Skin is warm and dry.  Nursing note and vitals reviewed.   ED Course  Procedures (including critical care time)  Labs Review Labs Reviewed - No data to display  Imaging Review Dg Finger Thumb Left  07/23/2015  CLINICAL DATA:  Pressure washer injury. EXAM: LEFT THUMB 2+V COMPARISON:  None. FINDINGS: There is no evidence of fracture or dislocation. There is no evidence of arthropathy or other  focal bone abnormality. Soft tissues are unremarkable IMPRESSION: Negative. Electronically Signed   By: Ellery Plunk M.D.   On: 07/23/2015 21:10     Visual Acuity Review  Right Eye Distance:   Left Eye Distance:   Bilateral Distance:    Right Eye Near:   Left Eye Near:    Bilateral Near:         MDM   1. Thumb pain, left        Linna Hoff, MD 07/23/15 2122

## 2015-08-14 ENCOUNTER — Ambulatory Visit (INDEPENDENT_AMBULATORY_CARE_PROVIDER_SITE_OTHER): Payer: Medicaid Other | Admitting: Certified Nurse Midwife

## 2015-08-14 VITALS — BP 121/88 | HR 71 | Wt 167.0 lb

## 2015-08-14 DIAGNOSIS — Z30430 Encounter for insertion of intrauterine contraceptive device: Secondary | ICD-10-CM | POA: Diagnosis not present

## 2015-08-14 DIAGNOSIS — Z01818 Encounter for other preprocedural examination: Secondary | ICD-10-CM

## 2015-08-14 DIAGNOSIS — Z3202 Encounter for pregnancy test, result negative: Secondary | ICD-10-CM

## 2015-08-14 DIAGNOSIS — R1032 Left lower quadrant pain: Secondary | ICD-10-CM

## 2015-08-14 HISTORY — DX: Encounter for insertion of intrauterine contraceptive device: Z30.430

## 2015-08-14 LAB — POCT URINE PREGNANCY: Preg Test, Ur: NEGATIVE

## 2015-08-14 MED ORDER — FLUOXETINE HCL (PMDD) 10 MG PO TABS: 1.0000 | ORAL_TABLET | Freq: Two times a day (BID) | ORAL | Status: DC

## 2015-08-14 MED ORDER — TRAMADOL HCL 50 MG PO TABS: 50.0000 mg | ORAL_TABLET | Freq: Four times a day (QID) | ORAL | Status: DC | PRN

## 2015-08-14 MED ORDER — IBUPROFEN 800 MG PO TABS: 800.0000 mg | ORAL_TABLET | Freq: Three times a day (TID) | ORAL | Status: DC

## 2015-08-14 NOTE — Progress Notes (Signed)
Patient ID: Marissa EaglesLindsay O Alan, female   DOB: 10-Dec-1985, 30 y.o.   MRN: [id number]  IUD Procedure Note   DIAGNOSIS: Desires long-term, reversible contraception   PROCEDURE: IUD placement Performing Provider: Orvilla Cornwallachelle Reve Crocket CNM  Patient counseled prior to procedure. I explained risks and benefits of Liletta IUD, reviewed alternative forms of contraception. Patient stated understanding and consented to continue with procedure.   LMP: 08/13/15 Pregnancy Test: Negative Lot #: [id number] Expiration Date: 07/2018   IUD type: [   ] Mirena   [   ] Paraguard  [   ] Christean GriefSkyla   [   ]  Kyleena   [X]  Liletta   PROCEDURE:  Timeout procedure was performed to ensure right patient and right site.  A bimanual exam was performed to determine the position of the uterus, retroverted and retroflexed. The speculum was placed. The vagina and cervix was sterilized in the usual manner and sterile technique was maintained throughout the course of the procedure. A single toothed tenaculum was applied to the posterior lip of the cervix and gentle traction applied. The depth of the uterus was sounded to 8 cm. With gentle traction on the tenaculum, the IUD was inserted to the appropriate depth and inserted without difficulty.  The string was cut to an estimated 4 cm length. Bleeding was minimal. The patient tolerated the procedure well.   Follow up: The patient tolerated the procedure well without complications.  Standard post-procedure care is explained and return precautions are given.  Orvilla Cornwallachelle Mikayla Chiusano CNM

## 2015-08-15 ENCOUNTER — Telehealth: Payer: Self-pay | Admitting: *Deleted

## 2015-08-15 NOTE — Telephone Encounter (Signed)
Patient states she had IUD inserted yesterday and she is having cramping. She states the Tramadol is not working. Boykin ReaperRachelle states she is not going to give narcotics-please talk to Dr Clearance CootsHarper. Patient came in asking for them.

## 2015-08-16 LAB — NUSWAB VAGINITIS PLUS (VG+)
Candida albicans, NAA: NEGATIVE
Candida glabrata, NAA: NEGATIVE
Chlamydia trachomatis, NAA: NEGATIVE
Neisseria gonorrhoeae, NAA: NEGATIVE
Trich vag by NAA: NEGATIVE

## 2015-09-03 ENCOUNTER — Encounter (HOSPITAL_COMMUNITY): Payer: Self-pay | Admitting: *Deleted

## 2015-09-03 ENCOUNTER — Emergency Department (HOSPITAL_COMMUNITY)
Admission: EM | Admit: 2015-09-03 | Discharge: 2015-09-03 | Disposition: A | Discharge status: Home / Self Care | Payer: Medicaid Other | Attending: Emergency Medicine | Admitting: Emergency Medicine

## 2015-09-03 DIAGNOSIS — F17210 Nicotine dependence, cigarettes, uncomplicated: Secondary | ICD-10-CM | POA: Insufficient documentation

## 2015-09-03 DIAGNOSIS — F319 Bipolar disorder, unspecified: Secondary | ICD-10-CM | POA: Diagnosis not present

## 2015-09-03 DIAGNOSIS — S61213A Laceration without foreign body of left middle finger without damage to nail, initial encounter: Secondary | ICD-10-CM | POA: Diagnosis not present

## 2015-09-03 DIAGNOSIS — S61219A Laceration without foreign body of unspecified finger without damage to nail, initial encounter: Secondary | ICD-10-CM

## 2015-09-03 DIAGNOSIS — Y9389 Activity, other specified: Secondary | ICD-10-CM | POA: Insufficient documentation

## 2015-09-03 DIAGNOSIS — Y9289 Other specified places as the place of occurrence of the external cause: Secondary | ICD-10-CM | POA: Insufficient documentation

## 2015-09-03 DIAGNOSIS — G8929 Other chronic pain: Secondary | ICD-10-CM | POA: Diagnosis not present

## 2015-09-03 DIAGNOSIS — W260XXA Contact with knife, initial encounter: Secondary | ICD-10-CM | POA: Diagnosis not present

## 2015-09-03 DIAGNOSIS — Y998 Other external cause status: Secondary | ICD-10-CM | POA: Diagnosis not present

## 2015-09-03 DIAGNOSIS — Z79899 Other long term (current) drug therapy: Secondary | ICD-10-CM | POA: Insufficient documentation

## 2015-09-03 DIAGNOSIS — Z791 Long term (current) use of non-steroidal anti-inflammatories (NSAID): Secondary | ICD-10-CM | POA: Diagnosis not present

## 2015-09-03 DIAGNOSIS — S6992XA Unspecified injury of left wrist, hand and finger(s), initial encounter: Secondary | ICD-10-CM | POA: Diagnosis present

## 2015-09-03 DIAGNOSIS — Z8709 Personal history of other diseases of the respiratory system: Secondary | ICD-10-CM | POA: Diagnosis not present

## 2015-09-03 MED ORDER — LIDOCAINE HCL (PF) 1 % IJ SOLN
30.0000 mL | Freq: Once | INTRAMUSCULAR | Status: AC
  Administered 2015-09-03: 30 mL
  Filled 2015-09-03: qty 30

## 2015-09-03 MED ORDER — BACITRACIN ZINC 500 UNIT/GM EX OINT
TOPICAL_OINTMENT | Freq: Two times a day (BID) | CUTANEOUS | Status: DC
  Administered 2015-09-03: 1 via TOPICAL

## 2015-09-03 NOTE — ED Notes (Signed)
The pt lacerated her lt middle finger with a kitchen knife  While she was cutting frozen food attempting to separate it small laceration to the distal  tip

## 2015-09-03 NOTE — Discharge Instructions (Signed)
Follow up with your doctor in one week for suture removal. Return here sooner for any problems. Take tylenol and ibuprofen as needed for discomfort.
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
## 2015-09-18 ENCOUNTER — Ambulatory Visit: Payer: Medicaid Other | Admitting: Certified Nurse Midwife

## 2015-10-22 ENCOUNTER — Encounter (HOSPITAL_COMMUNITY): Payer: Self-pay | Admitting: *Deleted

## 2015-10-22 DIAGNOSIS — M545 Low back pain: Secondary | ICD-10-CM | POA: Insufficient documentation

## 2015-10-22 DIAGNOSIS — X033XXA Fall due to controlled fire, not in building or structure, initial encounter: Secondary | ICD-10-CM | POA: Insufficient documentation

## 2015-10-22 DIAGNOSIS — M25551 Pain in right hip: Secondary | ICD-10-CM | POA: Insufficient documentation

## 2015-10-22 DIAGNOSIS — T2024XA Burn of second degree of nose (septum), initial encounter: Secondary | ICD-10-CM | POA: Diagnosis not present

## 2015-10-22 DIAGNOSIS — Y929 Unspecified place or not applicable: Secondary | ICD-10-CM | POA: Diagnosis not present

## 2015-10-22 DIAGNOSIS — Y9302 Activity, running: Secondary | ICD-10-CM | POA: Diagnosis not present

## 2015-10-22 DIAGNOSIS — F17210 Nicotine dependence, cigarettes, uncomplicated: Secondary | ICD-10-CM | POA: Diagnosis not present

## 2015-10-22 DIAGNOSIS — Z79899 Other long term (current) drug therapy: Secondary | ICD-10-CM | POA: Insufficient documentation

## 2015-10-22 DIAGNOSIS — Y999 Unspecified external cause status: Secondary | ICD-10-CM | POA: Diagnosis not present

## 2015-10-22 DIAGNOSIS — T20212A Burn of second degree of left ear [any part, except ear drum], initial encounter: Secondary | ICD-10-CM | POA: Insufficient documentation

## 2015-10-22 DIAGNOSIS — T22132A Burn of first degree of left upper arm, initial encounter: Secondary | ICD-10-CM | POA: Diagnosis not present

## 2015-10-22 MED ORDER — OXYCODONE-ACETAMINOPHEN 5-325 MG PO TABS
ORAL_TABLET | ORAL | Status: AC
  Filled 2015-10-22: qty 1

## 2015-10-22 MED ORDER — OXYCODONE-ACETAMINOPHEN 5-325 MG PO TABS
1.0000 | ORAL_TABLET | Freq: Once | ORAL | Status: AC
  Administered 2015-10-22: 1 via ORAL
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
## 2015-10-23 ENCOUNTER — Emergency Department (HOSPITAL_COMMUNITY)
Admission: EM | Admit: 2015-10-23 | Discharge: 2015-10-23 | Disposition: A | Discharge status: Home / Self Care | Payer: Medicaid Other | Attending: Emergency Medicine | Admitting: Emergency Medicine

## 2015-10-23 ENCOUNTER — Emergency Department (HOSPITAL_COMMUNITY): Payer: Medicaid Other

## 2015-10-23 DIAGNOSIS — M545 Low back pain, unspecified: Secondary | ICD-10-CM

## 2015-10-23 DIAGNOSIS — M25551 Pain in right hip: Secondary | ICD-10-CM

## 2015-10-23 DIAGNOSIS — T22132A Burn of first degree of left upper arm, initial encounter: Secondary | ICD-10-CM

## 2015-10-23 LAB — POC URINE PREG, ED: Preg Test, Ur: NEGATIVE

## 2015-10-23 MED ORDER — TETANUS-DIPHTH-ACELL PERTUSSIS 5-2.5-18.5 LF-MCG/0.5 IM SUSP
0.5000 mL | Freq: Once | INTRAMUSCULAR | Status: AC
  Administered 2015-10-23: 0.5 mL via INTRAMUSCULAR
  Filled 2015-10-23: qty 0.5

## 2015-10-23 MED ORDER — HYDROCODONE-ACETAMINOPHEN 5-325 MG PO TABS: 2.0000 | ORAL_TABLET | ORAL | Status: DC | PRN
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
## 2015-11-13 ENCOUNTER — Ambulatory Visit: Payer: Medicaid Other | Admitting: Certified Nurse Midwife

## 2015-11-22 ENCOUNTER — Ambulatory Visit (HOSPITAL_COMMUNITY)
Admission: EM | Admit: 2015-11-22 | Discharge: 2015-11-22 | Disposition: A | Discharge status: Home / Self Care | Payer: Medicaid Other | Attending: Family Medicine | Admitting: Family Medicine

## 2015-11-22 ENCOUNTER — Encounter (HOSPITAL_COMMUNITY): Payer: Self-pay | Admitting: Family Medicine

## 2015-11-22 DIAGNOSIS — A084 Viral intestinal infection, unspecified: Secondary | ICD-10-CM

## 2015-11-22 DIAGNOSIS — B349 Viral infection, unspecified: Secondary | ICD-10-CM

## 2015-11-22 LAB — POCT URINALYSIS DIP (DEVICE)
Bilirubin Urine: NEGATIVE
Glucose, UA: NEGATIVE mg/dL
Hgb urine dipstick: NEGATIVE
Ketones, ur: NEGATIVE mg/dL
Leukocytes, UA: NEGATIVE
Nitrite: NEGATIVE
Protein, ur: NEGATIVE mg/dL
Specific Gravity, Urine: >=1.03 (ref 1.005–1.030)
Urobilinogen, UA: 0.2 mg/dL (ref 0.0–1.0)
pH: 5 (ref 5.0–8.0)

## 2015-11-22 LAB — POCT PREGNANCY, URINE: Preg Test, Ur: NEGATIVE

## 2015-11-22 MED ORDER — ONDANSETRON HCL 4 MG PO TABS: 4.0000 mg | ORAL_TABLET | Freq: Four times a day (QID) | ORAL | 0 refills | Status: DC

## 2015-11-22 NOTE — ED Triage Notes (Signed)
C/o intermittent abd pain onset x4 days associated with n/v... LBM = yest... Denies fevers, urinary sx, vag d/c  A&O x4... NAD

## 2015-11-22 NOTE — ED Provider Notes (Signed)
[facility]    CSN: [id number] Arrival date & time: 11/22/15  1608  First Provider Contact:  First MD Initiated Contact with Patient 11/22/15 1637     History   Chief Complaint Chief Complaint  Patient presents with  . Abdominal Pain   HPI Marissa Smith is a 30 y.o. female presenting for abdominal pain.   She notes 3 days of intermittent crampy generalized abdominal pain that occurs several times per day, lasting < 1 hour each time, increasing in frequency since that time. Non-radiating, worse with food, not necessarily improved with maalox, She also has nausea without emesis. No diarrhea, constipation, vaginal discharge, dysuria, back pain. She had a single episode of small volume spotting 2 days ago but no bleeding. She's also been getting over some URI symptoms that have lasted about 10-14 days. She had a IUD placed within the last year, a replacement with smaller IUD from the mirena for crampy lower abdominal pain.   HPI  Past Medical History:  Diagnosis Date  . Back pain, chronic   . Cat allergies   . Depressed bipolar I disorder (HCC)   . Seasonal allergies     Patient Active Problem List   Diagnosis Date Noted  . Encounter for IUD insertion 08/14/2015  . Dysmenorrhea 10/31/2012  . Abnormal uterine bleeding (AUB) 10/31/2012    Past Surgical History:  Procedure Laterality Date  . CHOLECYSTECTOMY  2010   lap chole    OB History    Gravida Para Term Preterm AB Living   0 1 3   SAB TAB Ectopic Multiple Live Births   0 0 0 0 3       Home Medications    Prior to Admission medications   Medication Sig Start Date End Date Taking? Authorizing Provider  Specialty Vitamins Products (ECHINACEA C COMPLETE PO) Take by mouth.   Yes Historical Provider, MD  traMADol (ULTRAM) 50 MG tablet Take by mouth every 6 (six) hours as needed.   Yes Historical Provider, MD  acetaminophen (TYLENOL) 500 MG tablet Take 500-1,000 mg by mouth 2 (two) times daily as  needed for moderate pain.    Historical Provider, MD  Fluoxetine HCl, PMDD, (SARAFEM) 10 MG TABS Take 1 tablet (10 mg total) by mouth 2 (two) times daily. Start taking 14 days before onset of period and continue until the 1st period day. 08/14/15   Rachelle A Denney, CNM  HYDROcodone-acetaminophen (NORCO/VICODIN) 5-325 MG tablet Take 2 tablets by mouth every 4 (four) hours as needed. 10/23/15   Bethel Born, PA-C  ibuprofen (ADVIL,MOTRIN) 800 MG tablet Take 1 tablet (800 mg total) by mouth 3 (three) times daily. 08/14/15   Rachelle A Denney, CNM  Multiple Vitamin (MULTIVITAMIN) tablet Take 1 tablet by mouth daily.    Historical Provider, MD  ondansetron (ZOFRAN) 4 MG tablet Take 1 tablet (4 mg total) by mouth every 6 (six) hours. 11/22/15   Tyrone Nine, MD    Family History Family History  Problem Relation Age of Onset  . Heart failure Father     Social History Social History  Substance Use Topics  . Smoking status: Light Tobacco Smoker    Packs/day: 0.25    Types: Cigarettes  . Smokeless tobacco: Never Used  . Alcohol use No     Allergies   Darvocet [propoxyphene n-acetaminophen] and Miconazole nitrate   Review of Systems Review of Systems As above  Physical Exam Triage Vital Signs ED Triage Vitals  Enc Vitals Group     BP      Pulse      Resp      Temp      Temp src      SpO2      Weight      Height      Head Circumference      Peak Flow      Pain Score      Pain Loc      Pain Edu?      Excl. in GC?    No data found.   Updated Vital Signs BP 117/72 (BP Location: Left Arm)   Pulse 69   Temp 97.8 F (36.6 C) (Oral)   Resp 18   SpO2 99%   Physical Exam  Constitutional: She is oriented to person, place, and time. She appears well-developed and well-nourished. No distress.  HENT:  Mouth/Throat: No oropharyngeal exudate.  erythematous oropharynx   Eyes: EOM are normal. Pupils are equal, round, and reactive to light. No scleral icterus.  Neck: Normal  range of motion. Neck supple. No JVD present.  Cardiovascular: Normal rate, regular rhythm, normal heart sounds and intact distal pulses.   No murmur heard. Pulmonary/Chest: Effort normal and breath sounds normal. No respiratory distress.  Abdominal: Soft. Bowel sounds are normal. She exhibits no distension. There is tenderness (mild, diffuse without rebound or guarding). No hernia.  Musculoskeletal: Normal range of motion. She exhibits no edema or tenderness.  Lymphadenopathy:    She has no cervical adenopathy.  Neurological: She is alert and oriented to person, place, and time. She exhibits normal muscle tone.  Skin: Skin is warm and dry. Capillary refill takes less than 2 seconds.  Vitals reviewed.    UC Treatments / Results  Labs (all labs ordered are listed, but only abnormal results are displayed) Labs Reviewed - No data to display  EKG  EKG Interpretation None      Radiology No results found.  Procedures Procedures (including critical care time)  Medications Ordered in UC Medications - No data to display  Initial Impression / Assessment and Plan / UC Course  I have reviewed the triage vital signs and the nursing notes.  Pertinent labs & imaging results that were available during my care of the patient were reviewed by me and considered in my medical decision making (see chart for details).  Final Clinical Impressions(s) / UC Diagnoses   Final diagnoses:  Viral gastroenteritis  Viral syndrome   30 y.o. female presenting for general abdominal pain and nausea with benign exam. Urinalysis and UPT negative. With presence of IUD and history of pain from IUD, Heupel be malposition of IUD, though symptoms are not consistent with this. Would consider U/S if symptoms continue.  - Zofran, hydration, return precautions.   New Prescriptions New Prescriptions   ONDANSETRON (ZOFRAN) 4 MG TABLET    Take 1 tablet (4 mg total) by mouth every 6 (six) hours.     Tyrone Nineyan B Raymond Azure,  MD 11/22/15 [phone]

## 2015-11-22 NOTE — Discharge Instructions (Signed)
Try honey, lozenges, flonase, and continue zyrtec for URI symptoms. If these get worse after improving, please seek medical care.   If your abdominal pain is worsening, you develop a fever, or are unable to maintain hydration, you need to be reevaluated urgently. You [place] need a pelvic ultrasound if your lower abdominal cramping continues.   If any of your labs come back positive, we will call you.

## 2015-11-27 ENCOUNTER — Encounter (HOSPITAL_COMMUNITY): Payer: Self-pay | Admitting: *Deleted

## 2015-11-27 ENCOUNTER — Emergency Department (HOSPITAL_COMMUNITY): Payer: Medicaid Other

## 2015-11-27 ENCOUNTER — Emergency Department (HOSPITAL_COMMUNITY)
Admission: EM | Admit: 2015-11-27 | Discharge: 2015-11-27 | Disposition: A | Discharge status: Home / Self Care | Payer: Medicaid Other | Attending: Emergency Medicine | Admitting: Emergency Medicine

## 2015-11-27 DIAGNOSIS — M940 Chondrocostal junction syndrome [Tietze]: Secondary | ICD-10-CM | POA: Insufficient documentation

## 2015-11-27 DIAGNOSIS — R059 Cough, unspecified: Secondary | ICD-10-CM

## 2015-11-27 DIAGNOSIS — J018 Other acute sinusitis: Secondary | ICD-10-CM

## 2015-11-27 DIAGNOSIS — Z720 Tobacco use: Secondary | ICD-10-CM

## 2015-11-27 DIAGNOSIS — R51 Headache: Secondary | ICD-10-CM

## 2015-11-27 DIAGNOSIS — R519 Headache, unspecified: Secondary | ICD-10-CM

## 2015-11-27 DIAGNOSIS — R072 Precordial pain: Secondary | ICD-10-CM | POA: Diagnosis present

## 2015-11-27 DIAGNOSIS — F17210 Nicotine dependence, cigarettes, uncomplicated: Secondary | ICD-10-CM | POA: Insufficient documentation

## 2015-11-27 DIAGNOSIS — R0981 Nasal congestion: Secondary | ICD-10-CM

## 2015-11-27 DIAGNOSIS — J069 Acute upper respiratory infection, unspecified: Secondary | ICD-10-CM | POA: Insufficient documentation

## 2015-11-27 DIAGNOSIS — R05 Cough: Secondary | ICD-10-CM

## 2015-11-27 LAB — I-STAT TROPONIN, ED
Troponin i, poc: 0 ng/mL (ref 0.00–0.08)
Troponin i, poc: 0.01 ng/mL (ref 0.00–0.08)

## 2015-11-27 LAB — CBC
HCT: 41.2 % (ref 36.0–46.0)
Hemoglobin: 13.5 g/dL (ref 12.0–15.0)
MCH: 31.5 pg (ref 26.0–34.0)
MCHC: 32.8 g/dL (ref 30.0–36.0)
MCV: 96 fL (ref 78.0–100.0)
Platelets: 251 10*3/uL (ref 150–400)
RBC: 4.29 MIL/uL (ref 3.87–5.11)
RDW: 13.6 % (ref 11.5–15.5)
WBC: 4.9 10*3/uL (ref 4.0–10.5)

## 2015-11-27 LAB — I-STAT BETA HCG BLOOD, ED (MC, WL, AP ONLY): I-stat hCG, quantitative: <5 m[IU]/mL (ref <5)

## 2015-11-27 LAB — BASIC METABOLIC PANEL
Anion gap: 5 (ref 5–15)
BUN: 10 mg/dL (ref 6–20)
CO2: 25 mmol/L (ref 22–32)
Calcium: 9.1 mg/dL (ref 8.9–10.3)
Chloride: 109 mmol/L (ref 101–111)
Creatinine, Ser: 0.66 mg/dL (ref 0.44–1.00)
GFR calc Af Amer: >60 mL/min (ref >60)
GFR calc non Af Amer: >60 mL/min (ref >60)
Glucose, Bld: 96 mg/dL (ref 65–99)
Potassium: 4 mmol/L (ref 3.5–5.1)
Sodium: 139 mmol/L (ref 135–145)

## 2015-11-27 MED ORDER — DM-GUAIFENESIN ER 30-600 MG PO TB12
1.0000 | ORAL_TABLET | Freq: Two times a day (BID) | ORAL | Status: DC
  Administered 2015-11-27: 1 via ORAL
  Filled 2015-11-27: qty 1

## 2015-11-27 MED ORDER — AMOXICILLIN-POT CLAVULANATE 875-125 MG PO TABS: 1.0000 | ORAL_TABLET | Freq: Two times a day (BID) | ORAL | 0 refills | Status: DC

## 2015-11-27 MED ORDER — SODIUM CHLORIDE 0.9 % IV BOLUS (SEPSIS)
2000.0000 mL | Freq: Once | INTRAVENOUS | Status: AC
  Administered 2015-11-27: 2000 mL via INTRAVENOUS

## 2015-11-27 MED ORDER — KETOROLAC TROMETHAMINE 30 MG/ML IJ SOLN
30.0000 mg | Freq: Once | INTRAMUSCULAR | Status: AC
  Administered 2015-11-27: 30 mg via INTRAVENOUS
  Filled 2015-11-27: qty 1

## 2015-11-27 NOTE — ED Notes (Signed)
PA at bedside.

## 2015-11-27 NOTE — Discharge Instructions (Signed)
Continue to stay well-hydrated. Gargle warm salt water and spit it out. Use chloraseptic spray as needed for sore throat. Continue to alternate between Tylenol and Ibuprofen for pain or fever. Use Mucinex for cough suppression/expectoration of mucus. Use netipot and flonase to help with nasal congestion. Use over the counter antihistamines like claritin/zyrtec/etc to decrease secretions and for watery itchy eyes, and to help with symptoms. Take antibiotic as directed to treat any possible bacterial sinus/upper respiratory infection, but keep in mind that this [place] be a virus or allergies so the antibiotics [place] not help resolve the symptoms if this is the case. STOP SMOKING! Use heat to the area of soreness on your chest, as the chest pain is likely from pain due to coughing.  Followup with your primary care doctor in 5-7 days for recheck of ongoing symptoms. Return to emergency department for emergent changing or worsening of symptoms.

## 2015-11-27 NOTE — ED Triage Notes (Addendum)
Pt reports being seen at [facility] on Friday for URI. Reports still having non productive cough and chest pains. Also having severe headache since Saturday. No acute distress noted at triage.

## 2015-11-27 NOTE — ED Provider Notes (Signed)
[facility]-EMERGENCY DEPT Provider Note   CSN: [id number] Arrival date & time: 11/27/15  1209     History   Chief Complaint Chief Complaint  Patient presents with  . Chest Pain  . Cough    HPI Marissa Smith is a 30 y.o. female with a PMHx of chronic back pain, seasonal allergies, and bipolar disorder, with a PSHx of cholecystectomy, who presents to the ED with complaints of URI symptoms 3 weeks. Patient states that initially she thought these were her allergies, but her symptoms have gradually worsened. Symptoms include sinus pressure, frontal sinus headache, and dry cough. Yesterday she developed 7/10 intermittent stabbing centralized chest pain that is nonradiating, occurs with cough, and unrelieved with Claritin, DayQuil, NyQuil, tramadol, and ibuprofen. 4 days ago on Saturday she developed a 101.7 fever, but she has not had any fever since then. Positive smoker. Positive MI in father at 63 years old who died of his MI. No known sick contacts.  She denies any chills, shortness breath, rhinorrhea, sore throat, vision changes, ear pain or drainage, leg swelling, recent travel/surgery/immobilization, estrogen use, history of DVT/PE, new or worsening abdominal pain, nausea, vomiting, diarrhea, constipation, dysuria, hematuria, vaginal bleeding or discharge, numbness, tingling, weakness, lightheadedness, or diaphoresis. She was seen at urgent care on Friday for similar symptoms, was told that she needed to use hot tea for her URI. Chart review reveals that she was diagnosed at that time with viral gastroenteritis and there is little mention of a URI complaint or workup.    The history is provided by the patient and medical records. No language interpreter was used.  Chest Pain   This is a new problem. The current episode started yesterday. Episode frequency: intermittently. The problem has not changed since onset.The pain is associated with coughing. The pain is present in the substernal region. The  pain is at a severity of 7/10. The pain is moderate. The quality of the pain is described as stabbing. The pain does not radiate. Exacerbated by: coughing. Associated symptoms include cough, a fever (Tmax 101.7 Saturday (4 days ago), none since) and headaches. Pertinent negatives include no abdominal pain, no diaphoresis, no lower extremity edema, no nausea, no numbness, no shortness of breath, no vomiting and no weakness. Treatments tried: ibuprofen, BC powders, tramadol, nyquil/dayquil, and claritin. The treatment provided no relief. Risk factors include smoking/tobacco exposure.  Her family medical history is significant for CAD and early MI.  Cough  Associated symptoms include chest pain and headaches. Pertinent negatives include no chills, no ear pain, no rhinorrhea, no sore throat, no myalgias and no shortness of breath.    Past Medical History:  Diagnosis Date  . Back pain, chronic   . Cat allergies   . Depressed bipolar I disorder (HCC)   . Seasonal allergies     Patient Active Problem List   Diagnosis Date Noted  . Encounter for IUD insertion 08/14/2015  . Dysmenorrhea 10/31/2012  . Abnormal uterine bleeding (AUB) 10/31/2012    Past Surgical History:  Procedure Laterality Date  . CHOLECYSTECTOMY  2010   lap chole    OB History    Gravida Para Term Preterm AB Living   7 3 3  0 1 3   SAB TAB Ectopic Multiple Live Births   0 0 0 0 3       Home Medications    Prior to Admission medications   Medication Sig Start Date End Date Taking? Authorizing Provider  acetaminophen (TYLENOL) 500 MG tablet Take 500-1,000  mg by mouth 2 (two) times daily as needed for moderate pain.    Historical Provider, MD  Fluoxetine HCl, PMDD, (SARAFEM) 10 MG TABS Take 1 tablet (10 mg total) by mouth 2 (two) times daily. Start taking 14 days before onset of period and continue until the 1st period day. 08/14/15   Rachelle A Denney, CNM  HYDROcodone-acetaminophen (NORCO/VICODIN) 5-325 MG tablet Take 2  tablets by mouth every 4 (four) hours as needed. 10/23/15   Bethel BornKelly Marie Gekas, PA-C  ibuprofen (ADVIL,MOTRIN) 800 MG tablet Take 1 tablet (800 mg total) by mouth 3 (three) times daily. 08/14/15   Rachelle A Denney, CNM  Multiple Vitamin (MULTIVITAMIN) tablet Take 1 tablet by mouth daily.    Historical Provider, MD  ondansetron (ZOFRAN) 4 MG tablet Take 1 tablet (4 mg total) by mouth every 6 (six) hours. 11/22/15   Tyrone Nineyan B Grunz, MD  Specialty Vitamins Products (ECHINACEA C COMPLETE PO) Take by mouth.    Historical Provider, MD  traMADol (ULTRAM) 50 MG tablet Take by mouth every 6 (six) hours as needed.    Historical Provider, MD    Family History Family History  Problem Relation Age of Onset  . Heart failure Father     Social History Social History  Substance Use Topics  . Smoking status: Light Tobacco Smoker    Packs/day: 0.25    Types: Cigarettes  . Smokeless tobacco: Never Used  . Alcohol use No     Allergies   Darvocet [propoxyphene n-acetaminophen] and Miconazole nitrate   Review of Systems Review of Systems  Constitutional: Positive for fever (Tmax 101.7 Saturday (4 days ago), none since). Negative for chills and diaphoresis.  HENT: Positive for congestion and sinus pressure. Negative for ear discharge, ear pain, rhinorrhea, sore throat and trouble swallowing.   Eyes: Negative for visual disturbance.  Respiratory: Positive for cough. Negative for shortness of breath.   Cardiovascular: Positive for chest pain. Negative for leg swelling.  Gastrointestinal: Negative for abdominal pain, constipation, diarrhea, nausea and vomiting.  Genitourinary: Negative for dysuria, hematuria, vaginal bleeding and vaginal discharge.  Musculoskeletal: Negative for arthralgias and myalgias.  Skin: Negative for color change.  Allergic/Immunologic: Positive for environmental allergies. Negative for immunocompromised state.  Neurological: Positive for headaches. Negative for weakness,  light-headedness and numbness.  Psychiatric/Behavioral: Negative for confusion.   10 Systems reviewed and are negative for acute change except as noted in the HPI.   Physical Exam Updated Vital Signs BP 99/61 (BP Location: Right Arm)   Pulse (!) 53   Temp 98.2 F (36.8 C) (Oral)   Resp 12   SpO2 100%   Physical Exam  Constitutional: She is oriented to person, place, and time. Vital signs are normal. She appears well-developed and well-nourished.  Non-toxic appearance. No distress.  Afebrile, nontoxic, NAD  HENT:  Head: Normocephalic and atraumatic.  Nose: Mucosal edema present. No rhinorrhea. Right sinus exhibits maxillary sinus tenderness and frontal sinus tenderness. Left sinus exhibits maxillary sinus tenderness and frontal sinus tenderness.  Mouth/Throat: Uvula is midline, oropharynx is clear and moist and mucous membranes are normal. No trismus in the jaw. No uvula swelling.  Nose with mucosal edema, diffuse sinus TTP, without rhinorrhea. Oropharynx clear and moist, without uvular swelling or deviation, no trismus or drooling, no tonsillar swelling or erythema, no exudates.  Eyes: Conjunctivae and EOM are normal. Pupils are equal, round, and reactive to light. Right eye exhibits no discharge. Left eye exhibits no discharge.  PERRL, EOMI, no nystagmus, no visual field  deficits   Neck: Normal range of motion. Neck supple.  No meningeal signs  Cardiovascular: Normal rate, regular rhythm, normal heart sounds and intact distal pulses.  Exam reveals no gallop and no friction rub.   No murmur heard. Pulmonary/Chest: Effort normal and breath sounds normal. No respiratory distress. She has no decreased breath sounds. She has no wheezes. She has no rhonchi. She has no rales. She exhibits tenderness. She exhibits no crepitus, no deformity and no retraction.  CTAB in all lung fields, no w/r/r, no hypoxia or increased WOB, speaking in full sentences, SpO2 100% on RA Chest wall with mild diffuse  anterior chest wall TTP, without crepitus, deformities, or retractions   Abdominal: Soft. Normal appearance and bowel sounds are normal. She exhibits no distension. There is no tenderness. There is no rigidity, no rebound, no guarding, no CVA tenderness, no tenderness at McBurney's point and negative Murphy's sign.  Musculoskeletal: Normal range of motion.  MAE x4 Strength and sensation grossly intact Distal pulses intact Gait steady No pedal edema  Neurological: She is alert and oriented to person, place, and time. She has normal strength. No cranial nerve deficit or sensory deficit. Coordination and gait normal. GCS eye subscore is 4. GCS verbal subscore is 5. GCS motor subscore is 6.  CN 2-12 grossly intact A&O x4 GCS 15 Sensation and strength intact Gait nonataxic Coordination with finger-to-nose WNL Neg pronator drift   Skin: Skin is warm, dry and intact. No rash noted.  Psychiatric: She has a normal mood and affect.  Nursing note and vitals reviewed.    ED Treatments / Results  Labs (all labs ordered are listed, but only abnormal results are displayed) Labs Reviewed  BASIC METABOLIC PANEL  CBC  I-STAT TROPOININ, ED  I-STAT BETA HCG BLOOD, ED ([facility], [facility], [facility] ONLY)  I-STAT TROPOININ, ED    EKG  EKG Interpretation  Date/Time:  Wednesday November 27 2015 12:16:57 EDT Ventricular Rate:  66 PR Interval:  128 QRS Duration: 90 QT Interval:  428 QTC Calculation: 448 R Axis:   61 Text Interpretation:  Normal sinus rhythm Normal ECG No significant change since last tracing Confirmed by LIU MD, DANA [phone]) on 11/27/2015 5:28:08 PM       Radiology Dg Chest 2 View  Result Date: 11/27/2015 CLINICAL DATA:  Cough and chest pain. EXAM: CHEST  2 VIEW COMPARISON:  Eighteen 2013 FINDINGS: The heart size and mediastinal contours are within normal limits. Both lungs are clear. The visualized skeletal structures are unremarkable. IMPRESSION: Normal chest. Electronically Signed   By: Francene Boyers M.D.   On: 11/27/2015 12:58    Procedures Procedures (including critical care time)  Medications Ordered in ED Medications  dextromethorphan-guaiFENesin (MUCINEX DM) 30-600 MG per 12 hr tablet 1 tablet (1 tablet Oral Given 11/27/15 1750)  sodium chloride 0.9 % bolus 2,000 mL (2,000 mLs Intravenous New Bag/Given 11/27/15 1749)  ketorolac (TORADOL) 30 MG/ML injection 30 mg (30 mg Intravenous Given 11/27/15 1750)     Initial Impression / Assessment and Plan / ED Course  I have reviewed the triage vital signs and the nursing notes.  Pertinent labs & imaging results that were available during my care of the patient were reviewed by me and considered in my medical decision making (see chart for details).  Clinical Course    30 y.o. female here with URI symptoms x3 weeks that are gradually worsening, had a temp 101.7 4 days ago but none since. +sinus pressure/congestion, +sinus headache, +dry cough,  and chest pain with coughing. No focal neuro deficits, tenderness in the frontal sinuses, headache likely from congestion. Nasal mucosa edematous and erythematous. Clear lung exam, chest wall with reproducible tenderness, CP likely from costochondritis. EKG unremarkable. CXR neg. Labs all unremarkable, including trop drawn at 12:30; will redraw now, since pt has FHx of early cardiac disease/MI in father. +Smoker, smoking cessation advised. Symptoms likely related to URI. Will give fluids (BP slightly soft), and mucinex/toradol for symptom control, then reassess shortly.   7:59 PM Second trop neg. BP improving with fluids. Pt feeling somewhat improved. Discussed flonase use for sinus congestion. Smoking cessation encouraged. Will send home with abx for URI given duration of symptoms, but discussed OTC meds for URI/allergies. Mucinex for cough. Ibuprofen/tylenol for pain/fever. F/up with PCP in 5 days. Heat use for costochondral pain. I explained the diagnosis and have given explicit precautions to  return to the ER including for any other new or worsening symptoms. The patient understands and accepts the medical plan as it's been dictated and I have answered their questions. Discharge instructions concerning home care and prescriptions have been given. The patient is STABLE and is discharged to home in good condition.   Final Clinical Impressions(s) / ED Diagnoses   Final diagnoses:  URI (upper respiratory infection)  Cough  Costochondritis  Sinus headache  Nasal sinus congestion  Other subacute sinusitis  Tobacco use    New Prescriptions New Prescriptions   AMOXICILLIN-CLAVULANATE (AUGMENTIN) 875-125 MG TABLET    Take 1 tablet by mouth 2 (two) times daily. One po bid x 7 days     Levi StraussMercedes Camprubi-Soms, PA-C 11/27/15 2000    Lavera Guiseana Duo Liu, MD 11/28/15 0140

## 2015-12-24 ENCOUNTER — Encounter (HOSPITAL_COMMUNITY): Payer: Self-pay | Admitting: Emergency Medicine

## 2015-12-24 ENCOUNTER — Ambulatory Visit (HOSPITAL_COMMUNITY)
Admission: EM | Admit: 2015-12-24 | Discharge: 2015-12-24 | Disposition: A | Discharge status: Home / Self Care | Payer: Medicaid Other | Attending: Family Medicine | Admitting: Family Medicine

## 2015-12-24 DIAGNOSIS — S39012A Strain of muscle, fascia and tendon of lower back, initial encounter: Secondary | ICD-10-CM | POA: Diagnosis not present

## 2015-12-24 MED ORDER — HYDROCODONE-ACETAMINOPHEN 7.5-325 MG PO TABS: 1.0000 | ORAL_TABLET | Freq: Four times a day (QID) | ORAL | 0 refills | Status: DC | PRN

## 2015-12-24 NOTE — Discharge Instructions (Signed)
Apply cold compress to your back  Activity as tolerated.   Start moist heat to your back tomorrow.  Follow up with chiropractor of your choice.

## 2015-12-24 NOTE — ED Provider Notes (Signed)
CSN: [id number]     Arrival date & time 12/24/15  1526 History   First MD Initiated Contact with Patient 12/24/15 1655     Chief Complaint  Patient presents with  . Back Pain  . Fall   (Consider location/radiation/quality/duration/timing/severity/associated sxs/prior Treatment) HPI History obtained from patient:  Pt presents with the cc of:  Back pain Duration of symptoms: Several hours Treatment prior to arrival: Tylenol Context: Patient states that she slipped on wet stairs today falling down approximately 3 steps pain in her back at this time. Other symptoms include: Pain with walking Pain score: 4 FAMILY HISTORY: Mental illness    Past Medical History:  Diagnosis Date  . Back pain, chronic   . Cat allergies   . Depressed bipolar I disorder (HCC)   . Seasonal allergies    Past Surgical History:  Procedure Laterality Date  . CHOLECYSTECTOMY  2010   lap chole   Family History  Problem Relation Age of Onset  . Heart failure Father    Social History  Substance Use Topics  . Smoking status: Light Tobacco Smoker    Packs/day: 0.25    Types: Cigarettes  . Smokeless tobacco: Never Used  . Alcohol use No   OB History    Gravida Para Term Preterm AB Living   7 3 3  0 1 3   SAB TAB Ectopic Multiple Live Births   0 0 0 0 3     Review of Systems  Denies: HEADACHE, NAUSEA, ABDOMINAL PAIN, CHEST PAIN, CONGESTION, DYSURIA, SHORTNESS OF BREATH  Allergies  Darvocet [propoxyphene n-acetaminophen] and Miconazole nitrate  Home Medications   Prior to Admission medications   Medication Sig Start Date End Date Taking? Authorizing Provider  acetaminophen (TYLENOL) 500 MG tablet Take 500-1,000 mg by mouth 2 (two) times daily as needed for moderate pain.    Historical Provider, MD  amoxicillin-clavulanate (AUGMENTIN) 875-125 MG tablet Take 1 tablet by mouth 2 (two) times daily. One po bid x 7 days 11/27/15   Mercedes Camprubi-Soms, PA-C  cetirizine (ZYRTEC) 10 MG tablet Take 10  mg by mouth daily as needed for allergies. 09/23/15   Historical Provider, MD  Fluoxetine HCl, PMDD, (SARAFEM) 10 MG TABS Take 1 tablet (10 mg total) by mouth 2 (two) times daily. Start taking 14 days before onset of period and continue until the 1st period day. 08/14/15   Rachelle A Denney, CNM  HYDROcodone-acetaminophen (NORCO) 7.5-325 MG tablet Take 1 tablet by mouth every 6 (six) hours as needed for moderate pain. 12/24/15   Tharon Aquas, PA  HYDROcodone-acetaminophen (NORCO/VICODIN) 5-325 MG tablet Take 2 tablets by mouth every 4 (four) hours as needed. 10/23/15   Bethel Born, PA-C  ibuprofen (ADVIL,MOTRIN) 800 MG tablet Take 1 tablet (800 mg total) by mouth 3 (three) times daily. 08/14/15   Rachelle A Denney, CNM  levonorgestrel (LILETTA, 52 MG,) 18.6 MCG/DAY IUD IUD 1 each by Intrauterine route once.    Historical Provider, MD  Multiple Vitamin (MULTIVITAMIN) tablet Take 1 tablet by mouth daily.    Historical Provider, MD  ondansetron (ZOFRAN) 4 MG tablet Take 1 tablet (4 mg total) by mouth every 6 (six) hours. 11/22/15   Tyrone Nine, MD  Specialty Vitamins Products (ECHINACEA C COMPLETE PO) Take by mouth.    Historical Provider, MD  traMADol (ULTRAM) 50 MG tablet Take by mouth every 6 (six) hours as needed.    Historical Provider, MD   Meds Ordered and Administered this Visit  Medications -  No data to display  BP 114/70 (BP Location: Left Arm)   Pulse 66   Temp 97.9 F (36.6 C) (Oral)   Resp 15   LMP 12/17/2015 (Exact Date)   SpO2 100%  No data found.   Physical Exam NURSES NOTES AND VITAL SIGNS REVIEWED. CONSTITUTIONAL: Well developed, well nourished, no acute distress HEENT: normocephalic, atraumatic EYES: Conjunctiva normal NECK:normal ROM, supple, no adenopathy PULMONARY:No respiratory distress, normal effort ABDOMINAL: Soft, ND, NT BS+, No CVAT MUSCULOSKELETAL: Normal ROM of all extremities,  SKIN: warm and dry without rash PSYCHIATRIC: Mood and affect, behavior are  normal  Urgent Care Course   Clinical Course    Procedures (including critical care time)  Labs Review Labs Reviewed - No data to display  Imaging Review No results found.   Visual Acuity Review  Right Eye Distance:   Left Eye Distance:   Bilateral Distance:    Right Eye Near:   Left Eye Near:    Bilateral Near:         MDM   1. Lumbar strain, initial encounter     Patient is reassured that there are no issues that require transfer to higher level of care at this time or additional tests. Patient is advised to continue home symptomatic treatment. Patient is advised that if there are new or worsening symptoms to attend the emergency department, contact primary care provider, or return to UC. Instructions of care provided discharged home in stable condition.    THIS NOTE WAS GENERATED USING A VOICE RECOGNITION SOFTWARE PROGRAM. ALL REASONABLE EFFORTS  WERE MADE TO PROOFREAD THIS DOCUMENT FOR ACCURACY.  I have verbally reviewed the discharge instructions with the patient. A printed AVS was given to the patient.  All questions were answered prior to discharge.      Tharon AquasFrank C Selita Staiger, PA 12/24/15 2030

## 2015-12-24 NOTE — ED Triage Notes (Signed)
[facility] a [facility] lower right sided back pain secondary to a fall down 3 steps today. The patient stated that she has chronic back pain and the fall irritated it.

## 2016-01-18 ENCOUNTER — Encounter (HOSPITAL_COMMUNITY): Payer: Self-pay | Admitting: Emergency Medicine

## 2016-01-18 ENCOUNTER — Emergency Department (HOSPITAL_COMMUNITY)
Admission: EM | Admit: 2016-01-18 | Discharge: 2016-01-18 | Disposition: A | Discharge status: Home / Self Care | Payer: Medicaid Other | Attending: Emergency Medicine | Admitting: Emergency Medicine

## 2016-01-18 DIAGNOSIS — F17210 Nicotine dependence, cigarettes, uncomplicated: Secondary | ICD-10-CM | POA: Insufficient documentation

## 2016-01-18 DIAGNOSIS — M545 Low back pain, unspecified: Secondary | ICD-10-CM

## 2016-01-18 MED ORDER — KETOROLAC TROMETHAMINE 15 MG/ML IJ SOLN
30.0000 mg | Freq: Once | INTRAMUSCULAR | Status: AC
  Administered 2016-01-18: 30 mg via INTRAMUSCULAR
  Filled 2016-01-18: qty 2

## 2016-01-18 MED ORDER — MELOXICAM 15 MG PO TABS: 15.0000 mg | ORAL_TABLET | Freq: Every day | ORAL | 0 refills | Status: DC | PRN

## 2016-01-18 MED ORDER — OXYCODONE-ACETAMINOPHEN 5-325 MG PO TABS: 1.0000 | ORAL_TABLET | ORAL | 0 refills | Status: DC | PRN

## 2016-01-18 MED ORDER — HYDROMORPHONE HCL 1 MG/ML IJ SOLN
1.0000 mg | Freq: Once | INTRAMUSCULAR | Status: AC
  Administered 2016-01-18: 1 mg via INTRAMUSCULAR
  Filled 2016-01-18: qty 1

## 2016-01-18 MED ORDER — DIAZEPAM 5 MG PO TABS
5.0000 mg | ORAL_TABLET | Freq: Once | ORAL | Status: AC
  Administered 2016-01-18: 5 mg via ORAL
  Filled 2016-01-18: qty 1

## 2016-01-18 NOTE — ED Notes (Signed)
This RN went into room to assess pt, pt remained on phone talking to someone on cell phone. Pt appears in no distress.

## 2016-01-18 NOTE — ED Provider Notes (Signed)
[facility] Provider Note   CSN: [id number] Arrival date & time: 01/18/16  1220   By signing my name below, I, Marissa Smith, attest that this documentation has been prepared under the direction and in the presence of Marissa RazorStephen Marialy Urbanczyk, MD . Electronically Signed: Freida Busmaniana Smith, Scribe. 01/18/2016. 2:30 PM.   History   Chief Complaint Chief Complaint  Patient presents with  . Back Pain    The history is provided by the patient. No language interpreter was used.     HPI Comments:  Marissa Smith is a 30 y.o. female with a history of chronic back pain who presents to the Emergency Department complaining of constant, lower back pain since yesterday. She notes her pain radiates into her bilateral hips and reports associated numbness to toes. She has taken ibuprofen and robaxin without relief. She denies recent injury or change in activity but states she [place] have "pulled something". She states since losing ~100 pounds she has had very few episodes of back pain but notes this is the second episode in the last 2 months;  also notes fall before the 1st episode. She denies bowel/bladder incontinence and h/o back surgery. Pt has no other acute complaints or symptoms at this time.   Past Medical History:  Diagnosis Date  . Back pain, chronic   . Cat allergies   . Depressed bipolar I disorder (HCC)   . Seasonal allergies     Patient Active Problem List   Diagnosis Date Noted  . Encounter for IUD insertion 08/14/2015  . Dysmenorrhea 10/31/2012  . Abnormal uterine bleeding (AUB) 10/31/2012    Past Surgical History:  Procedure Laterality Date  . CHOLECYSTECTOMY  2010   lap chole    OB History    Gravida Para Term Preterm AB Living   7 3 3  0 1 3   SAB TAB Ectopic Multiple Live Births   0 0 0 0 3       Home Medications    Prior to Admission medications   Medication Sig Start Date End Date Taking? Authorizing Provider  acetaminophen (TYLENOL) 500 MG tablet Take 500-1,000 mg  by mouth 2 (two) times daily as needed for moderate pain.    Historical Provider, MD  amoxicillin-clavulanate (AUGMENTIN) 875-125 MG tablet Take 1 tablet by mouth 2 (two) times daily. One po bid x 7 days 11/27/15   Mercedes Camprubi-Soms, PA-C  cetirizine (ZYRTEC) 10 MG tablet Take 10 mg by mouth daily as needed for allergies. 09/23/15   Historical Provider, MD  Fluoxetine HCl, PMDD, (SARAFEM) 10 MG TABS Take 1 tablet (10 mg total) by mouth 2 (two) times daily. Start taking 14 days before onset of period and continue until the 1st period day. 08/14/15   Rachelle A Denney, CNM  HYDROcodone-acetaminophen (NORCO) 7.5-325 MG tablet Take 1 tablet by mouth every 6 (six) hours as needed for moderate pain. 12/24/15   Tharon AquasFrank C Patrick, PA  HYDROcodone-acetaminophen (NORCO/VICODIN) 5-325 MG tablet Take 2 tablets by mouth every 4 (four) hours as needed. 10/23/15   Bethel BornKelly Marie Gekas, PA-C  ibuprofen (ADVIL,MOTRIN) 800 MG tablet Take 1 tablet (800 mg total) by mouth 3 (three) times daily. 08/14/15   Rachelle A Denney, CNM  levonorgestrel (LILETTA, 52 MG,) 18.6 MCG/DAY IUD IUD 1 each by Intrauterine route once.    Historical Provider, MD  Multiple Vitamin (MULTIVITAMIN) tablet Take 1 tablet by mouth daily.    Historical Provider, MD  ondansetron (ZOFRAN) 4 MG tablet Take 1 tablet (4 mg total)  by mouth every 6 (six) hours. 11/22/15   Tyrone Nine, MD  Specialty Vitamins Products (ECHINACEA C COMPLETE PO) Take by mouth.    Historical Provider, MD  traMADol (ULTRAM) 50 MG tablet Take by mouth every 6 (six) hours as needed.    Historical Provider, MD    Family History Family History  Problem Relation Age of Onset  . Heart failure Father     Social History Social History  Substance Use Topics  . Smoking status: Light Tobacco Smoker    Packs/day: 0.25    Types: Cigarettes  . Smokeless tobacco: Never Used  . Alcohol use No     Allergies   Darvocet [propoxyphene n-acetaminophen] and Miconazole nitrate   Review of  Systems Review of Systems  Constitutional: Negative for chills and fever.  Respiratory: Negative for shortness of breath.   Cardiovascular: Negative for chest pain.  Musculoskeletal: Positive for arthralgias (hip pain) and back pain.  Neurological: Positive for numbness. Negative for weakness.  All other systems reviewed and are negative.    Physical Exam Updated Vital Signs BP 102/56   Pulse (!) 55   Temp 97.6 F (36.4 C) (Oral)   Resp 16   Ht 5\' 5"  (1.651 m)   Wt 164 lb 8 oz (74.6 kg)   LMP 12/10/2015   SpO2 99%   BMI 27.37 kg/m   Physical Exam  Constitutional: She is oriented to person, place, and time. She appears well-developed and well-nourished. No distress.  HENT:  Head: Normocephalic and atraumatic.  Eyes: EOM are normal.  Neck: Normal range of motion.  Cardiovascular: Normal rate, regular rhythm and normal heart sounds.   Pulmonary/Chest: Effort normal and breath sounds normal.  Abdominal: Soft. She exhibits no distension. There is no tenderness.  Musculoskeletal: Normal range of motion.  Neurological: She is alert and oriented to person, place, and time.  Skin: Skin is warm and dry.  Psychiatric: She has a normal mood and affect. Judgment normal.  Nursing note and vitals reviewed.    ED Treatments / Results  DIAGNOSTIC STUDIES:  Oxygen Saturation is 100% on RA, normal by my interpretation.    COORDINATION OF CARE:  2:21 PM Discussed treatment plan with pt at bedside and pt agreed to plan.  Labs (all labs ordered are listed, but only abnormal results are displayed) Labs Reviewed - No data to display  EKG  EKG Interpretation None       Radiology No results found.  Procedures Procedures (including critical care time)  Medications Ordered in ED Medications - No data to display   Initial Impression / Assessment and Plan / ED Course  I have reviewed the triage vital signs and the nursing notes.  Pertinent labs & imaging results that were  available during my care of the patient were reviewed by me and considered in my medical decision making (see chart for details).  Clinical Course    30 year old female with lower back pain. Acute on chronic. No acute trauma. Nonfocal neuro exam. Afebrile generally well-appearing. Feels better after treatment the emergency room. Plan continue symptomatically treatment. Return precautions were discussed. Outpatient follow-up otherwise.  Final Clinical Impressions(s) / ED Diagnoses   Final diagnoses:  Bilateral low back pain without sciatica, unspecified chronicity    New Prescriptions New Prescriptions   No medications on file   I personally preformed the services scribed in my presence. The recorded information has been reviewed is accurate. Marissa Razor, MD.     Marissa Razor, MD 01/21/16  1306  

## 2016-01-18 NOTE — ED Triage Notes (Signed)
Pt. Stated, I started having back pain last night to my hips and legs . I've had this before and I've used Robaxin and Motrin and this is not helping.

## 2016-02-18 ENCOUNTER — Encounter (HOSPITAL_COMMUNITY): Payer: Self-pay | Admitting: *Deleted

## 2016-02-18 ENCOUNTER — Ambulatory Visit (INDEPENDENT_AMBULATORY_CARE_PROVIDER_SITE_OTHER): Payer: Medicaid Other

## 2016-02-18 ENCOUNTER — Ambulatory Visit (HOSPITAL_COMMUNITY)
Admission: EM | Admit: 2016-02-18 | Discharge: 2016-02-18 | Disposition: A | Discharge status: Home / Self Care | Payer: Medicaid Other | Attending: Family Medicine | Admitting: Family Medicine

## 2016-02-18 DIAGNOSIS — G8929 Other chronic pain: Secondary | ICD-10-CM | POA: Diagnosis not present

## 2016-02-18 DIAGNOSIS — M5440 Lumbago with sciatica, unspecified side: Secondary | ICD-10-CM | POA: Diagnosis not present

## 2016-02-18 MED ORDER — KETOROLAC TROMETHAMINE 30 MG/ML IJ SOLN
INTRAMUSCULAR | Status: AC
  Filled 2016-02-18: qty 1

## 2016-02-18 MED ORDER — KETOROLAC TROMETHAMINE 30 MG/ML IJ SOLN
30.0000 mg | Freq: Once | INTRAMUSCULAR | Status: AC
  Administered 2016-02-18: 30 mg via INTRAMUSCULAR

## 2016-02-18 MED ORDER — DICLOFENAC POTASSIUM 50 MG PO TABS: 50.0000 mg | ORAL_TABLET | Freq: Three times a day (TID) | ORAL | 0 refills | Status: DC

## 2016-02-18 MED ORDER — METHYLPREDNISOLONE ACETATE 80 MG/ML IJ SUSP
80.0000 mg | Freq: Once | INTRAMUSCULAR | Status: AC
  Administered 2016-02-18: 80 mg via INTRAMUSCULAR

## 2016-02-18 MED ORDER — METHYLPREDNISOLONE 4 MG PO TBPK: ORAL_TABLET | ORAL | 0 refills | Status: DC

## 2016-02-18 MED ORDER — METHYLPREDNISOLONE ACETATE 80 MG/ML IJ SUSP
INTRAMUSCULAR | Status: AC
  Filled 2016-02-18: qty 1

## 2016-02-18 NOTE — ED Provider Notes (Signed)
[facility]    CSN: [id number] Arrival date & time: 02/18/16  1045     History   Chief Complaint Chief Complaint  Patient presents with  . Back Pain    HPI Marissa Smith is a 30 y.o. female.   The history is provided by the patient.  Back Pain  Location:  Lumbar spine Quality:  Burning Radiates to:  Does not radiate Pain severity:  Moderate Duration:  1 month Progression:  Worsening Chronicity:  New Context comment:  Had mri last week but no results until f/u in 6 weeks.. no gi or gu sx. Relieved by:  Nothing Worsened by:  Bending Associated symptoms: leg pain and numbness   Associated symptoms: no abdominal pain, no abdominal swelling, no bladder incontinence, no bowel incontinence, no dysuria, no fever, no paresthesias, no perianal numbness, no tingling and no weakness     Past Medical History:  Diagnosis Date  . Back pain, chronic   . Cat allergies   . Depressed bipolar I disorder (HCC)   . Seasonal allergies     Patient Active Problem List   Diagnosis Date Noted  . Encounter for IUD insertion 08/14/2015  . Dysmenorrhea 10/31/2012  . Abnormal uterine bleeding (AUB) 10/31/2012    Past Surgical History:  Procedure Laterality Date  . CHOLECYSTECTOMY  2010   lap chole    OB History    Gravida Para Term Preterm AB Living   7 3 3  0 1 3   SAB TAB Ectopic Multiple Live Births   0 0 0 0 3       Home Medications    Prior to Admission medications   Medication Sig Start Date End Date Taking? Authorizing Provider  acetaminophen (TYLENOL) 500 MG tablet Take 500-1,000 mg by mouth 2 (two) times daily as needed for moderate pain.    Historical Provider, MD  amoxicillin-clavulanate (AUGMENTIN) 875-125 MG tablet Take 1 tablet by mouth 2 (two) times daily. One po bid x 7 days Patient not taking: Reported on 01/18/2016 11/27/15   Mercedes Camprubi-Soms, PA-C  cetirizine (ZYRTEC) 10 MG tablet Take 10 mg by mouth daily as needed for allergies. 09/23/15    Historical Provider, MD  Fluoxetine HCl, PMDD, (SARAFEM) 10 MG TABS Take 1 tablet (10 mg total) by mouth 2 (two) times daily. Start taking 14 days before onset of period and continue until the 1st period day. 08/14/15   Rachelle A Denney, CNM  HYDROcodone-acetaminophen (NORCO) 7.5-325 MG tablet Take 1 tablet by mouth every 6 (six) hours as needed for moderate pain. Patient not taking: Reported on 01/18/2016 12/24/15   Tharon AquasFrank C Patrick, PA  HYDROcodone-acetaminophen (NORCO/VICODIN) 5-325 MG tablet Take 2 tablets by mouth every 4 (four) hours as needed. Patient not taking: Reported on 01/18/2016 10/23/15   Bethel BornKelly Marie Gekas, PA-C  ibuprofen (ADVIL,MOTRIN) 800 MG tablet Take 1 tablet (800 mg total) by mouth 3 (three) times daily. 08/14/15   Rachelle A Denney, CNM  levonorgestrel (LILETTA, 52 MG,) 18.6 MCG/DAY IUD IUD 1 each by Intrauterine route once.    Historical Provider, MD  meloxicam (MOBIC) 15 MG tablet Take 1 tablet (15 mg total) by mouth daily as needed for pain. 01/18/16   Raeford RazorStephen Kohut, MD  Multiple Vitamin (MULTIVITAMIN) tablet Take 1 tablet by mouth daily.    Historical Provider, MD  ondansetron (ZOFRAN) 4 MG tablet Take 1 tablet (4 mg total) by mouth every 6 (six) hours. 11/22/15   Tyrone Nineyan B Grunz, MD  oxyCODONE-acetaminophen (PERCOCET/ROXICET) [phone]  MG tablet Take 1 tablet by mouth every 4 (four) hours as needed for severe pain. 01/18/16   Raeford Razor, MD  Specialty Vitamins Products (ECHINACEA C COMPLETE PO) Take 1 tablet by mouth daily.     Historical Provider, MD    Family History Family History  Problem Relation Age of Onset  . Heart failure Father     Social History Social History  Substance Use Topics  . Smoking status: Light Tobacco Smoker    Packs/day: 0.25    Types: Cigarettes  . Smokeless tobacco: Never Used  . Alcohol use No     Allergies   Darvocet [propoxyphene n-acetaminophen] and Miconazole nitrate   Review of Systems Review of Systems  Constitutional: Negative for  fever.  Gastrointestinal: Negative.  Negative for abdominal pain and bowel incontinence.  Genitourinary: Negative.  Negative for bladder incontinence and dysuria.  Musculoskeletal: Positive for back pain. Negative for gait problem and joint swelling.  Neurological: Positive for numbness. Negative for tingling, weakness and paresthesias.  All other systems reviewed and are negative.    Physical Exam Triage Vital Signs ED Triage Vitals  Enc Vitals Group     BP 02/18/16 1102 115/70     Pulse Rate 02/18/16 1102 74     Resp 02/18/16 1102 16     Temp 02/18/16 1102 98 F (36.7 C)     Temp Source 02/18/16 1102 Oral     SpO2 02/18/16 1102 99 %     Weight --      Height --      Head Circumference --      Peak Flow --      Pain Score 02/18/16 1106 8     Pain Loc --      Pain Edu? --      Excl. in GC? --    No data found.   Updated Vital Signs BP 115/70 (BP Location: Right Arm)   Pulse 74   Temp 98 F (36.7 C) (Oral)   Resp 16   SpO2 99%   Visual Acuity Right Eye Distance:   Left Eye Distance:   Bilateral Distance:    Right Eye Near:   Left Eye Near:    Bilateral Near:     Physical Exam  Constitutional: She is oriented to person, place, and time. She appears well-developed and well-nourished. She appears distressed.  Abdominal: Soft. Bowel sounds are normal.  Musculoskeletal: She exhibits tenderness. She exhibits no deformity.       Lumbar back: She exhibits decreased range of motion, tenderness, pain and spasm. She exhibits no swelling, no deformity and normal pulse.       Back:  Neurological: She is alert and oriented to person, place, and time. No cranial nerve deficit. Coordination normal.  Skin: Skin is warm and dry.  Nursing note and vitals reviewed.    UC Treatments / Results  Labs (all labs ordered are listed, but only abnormal results are displayed) Labs Reviewed - No data to display  EKG  EKG Interpretation None       Radiology No results  found. X-rays reviewed and report per radiologist.  Procedures Procedures (including critical care time)  Medications Ordered in UC Medications  ketorolac (TORADOL) 30 MG/ML injection 30 mg (not administered)  methylPREDNISolone acetate (DEPO-MEDROL) injection 80 mg (not administered)     Initial Impression / Assessment and Plan / UC Course  I have reviewed the triage vital signs and the nursing notes.  Pertinent labs & imaging results  that were available during my care of the patient were reviewed by me and considered in my medical decision making (see chart for details).  Clinical Course       Final Clinical Impressions(s) / UC Diagnoses   Final diagnoses:  None    New Prescriptions New Prescriptions   No medications on file     Linna HoffJames D Emiko Osorto, MD 02/18/16 1238

## 2016-02-18 NOTE — ED Triage Notes (Signed)
Back  Pain   X  1  Month     Pt    Has  Been  Seen  By  pcp  Had  Mri  Last  Week       meloxicam   Not  Working

## 2016-02-20 ENCOUNTER — Ambulatory Visit: Payer: Medicaid Other | Attending: Internal Medicine | Admitting: Physical Therapy

## 2016-02-20 DIAGNOSIS — M47816 Spondylosis without myelopathy or radiculopathy, lumbar region: Secondary | ICD-10-CM | POA: Insufficient documentation

## 2016-02-20 DIAGNOSIS — M6281 Muscle weakness (generalized): Secondary | ICD-10-CM | POA: Diagnosis present

## 2016-02-20 DIAGNOSIS — M5442 Lumbago with sciatica, left side: Secondary | ICD-10-CM

## 2016-02-21 NOTE — Therapy (Signed)
[facility] [address] [place], [place], [address] Phone: [phone]   Fax:  [phone]  Physical Therapy Evaluation  Patient Details  Name: Marissa Smith MRN: [id number] Date of Birth: [birth year] Referring Provider: Dr Fleet Contras   Encounter Date: 02/20/2016      PT End of Session - 02/21/16 1225    Visit Number 1   Number of Visits 1   Date for PT Re-Evaluation 02/20/16   Authorization Type Adult medicaid    PT Start Time 0430   PT Stop Time 0520   PT Time Calculation (min) 50 min   Activity Tolerance Patient tolerated treatment well   Behavior During Therapy [facility] for tasks assessed/performed      Past Medical History:  Diagnosis Date  . Back pain, chronic   . Cat allergies   . Depressed bipolar I disorder (HCC)   . Seasonal allergies     Past Surgical History:  Procedure Laterality Date  . CHOLECYSTECTOMY  2010   lap chole    There were no vitals filed for this visit.       Subjective Assessment - 02/20/16 1643    Subjective Patient reports in 2005 she hurt her back. it got better over time. About a month ago she began to have pain in her lower back. The pain became significant over the past feew weeks. She feels pain shooting down her right and left legs at times. Today her left leg is hurting. 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 Any prolonged positioning makes her back worse.    Limitations Standing;Walking   How long can you sit comfortably? <5 min   How long can you stand comfortably? < 5 min    How long can you walk comfortably? < 500 feet    Diagnostic tests MRI: Disc budlge at L5-S1 Moderate    Currently in Pain? Yes   Pain Score 8    Pain Location Back   Pain Orientation Right;Lower   Pain Descriptors / Indicators Burning   Pain Onset 1 to 4 weeks ago   Pain Frequency Constant   Aggravating Factors  walking, standing, bending, lifitng    Pain Relieving  Factors nothing at this time   Effect of Pain on Daily Activities difficulty perfroming ADL's/ IADL's work/ Child care             [facility] PT Assessment - 02/21/16 0001      Assessment   Medical Diagnosis Low back pain    Referring Provider Dr Fleet Contras    Onset Date/Surgical Date --  > 1 month prior    Hand Dominance Right   Next MD Visit none scheduled    Prior Therapy None      Precautions   Precautions None     Restrictions   Weight Bearing Restrictions No     Balance Screen   Has the patient fallen in the past 6 months No     Home Environment   Living Environment Private residence   Additional Comments Lives with 3 children      Prior Function   Level of Independence Independent   Vocation Part time employment   Vocation Requirements maintenence    Leisure walking      Cognition   Overall Cognitive Status Within Functional Limits for tasks assessed   Attention Focused   Focused Attention Appears intact   Memory Appears intact   Awareness Appears intact  Problem Solving Appears intact     Observation/Other Assessments   Observations Nothing significant    Focus on Therapeutic Outcomes (FOTO)  Medicaid      Sensation   Additional Comments Numbness into the left heel. At times the numbness can go into the left.      ROM / Strength   AROM / PROM / Strength AROM;PROM;Strength     AROM   Overall AROM Comments Patient feels like she can not bend over towards her toes with out significant increase in pain. Rotation 50% limited with pain bilateral for lumbar spine.      PROM   Overall PROM Comments PROM of L hip 68 degrees right to 90 degrees but painful. Pain with passive ER/IR     Strength   Strength Assessment Site Hip;Knee   Right/Left Hip Right;Left   Right Hip Flexion 4/5   Right Hip Extension 4/5   Right Hip ABduction 4/5   Right Hip ADduction 4/5   Left Hip Flexion 3/5   Left Hip Extension 2/5   Left Hip ABduction 3/5   Left Hip ADduction  3/5   Right/Left Knee Right;Left   Right Knee Flexion 4+/5   Right Knee Extension 4+/5   Left Knee Flexion 3+/5   Left Knee Extension 3+/5     Palpation   Palpation comment significant spasming on the left moderate spasming on the right side of the lumbar spine into the upper gluteals      Special Tests    Special Tests --  Unable 2nd to pain with all movements of the legs      Transfers   Comments Stiffness upon initial standing.      Ambulation/Gait   Gait Comments decreased single leg stance on the left; decreased hip roation                    [facility] Adult PT Treatment/Exercise - 02/21/16 0001      Lumbar Exercises: Stretches   Passive Hamstring Stretch Limitations 2x30sec hold   Single Knee to Chest Stretch Limitations 2x30sec hold    Lower Trunk Rotation Limitations x10    Piriformis Stretch Limitations 2x30sec hold      Lumbar Exercises: Standing   Other Standing Lumbar Exercises standing shoulder extension yellow 2x10; scpaular retraction 2x10    Other Standing Lumbar Exercises lumbar traction      Lumbar Exercises: Supine   AB Set Limitations x5    Clam Limitations x5 yellow    Heel Slides Limitations reviewed for futre    Bent Knee Raise Limitations x5   Bridge Limitations reviewed for future    Straight Leg Raises Limitations reviewed for future                PT Education - 02/21/16 1223    Education provided Yes   Education Details Patient given extensive education on symptom mangement. She was advised to pick 2-3 light stretches that dpont hurt and build into light exercises. She was advised to do 2-3 stretches and 2-3 exercises per treatment.    Person(s) Educated Patient   Methods Explanation;Demonstration   Comprehension Returned demonstration;Verbal cues required;Verbalized understanding          PT Short Term Goals - 02/21/16 1230      PT SHORT TERM GOAL #1   Title Patient will be independent with initial HEP    Time 1    Period Days   Status Achieved     PT SHORT  TERM GOAL #2   Title Patient will verbalize an understanding of activity progression    Time 1   Period Days   Status Achieved                  Plan - 02/21/16 1226    Clinical Impression Statement Patient is a 30 year old female with low back pain radiating down into both her legs. She has significant spasming in her lumbar spine. Her MRI shows multi level disc buldging. She has limited strength and movement in her lower extremitys. She would benefit from further therapy but is limited by Medicaid restrictions. She was given a complete HEP with education on how to progress into it. It Herberger be difficult for her in the beggining 2nd to sensativity with all movements.    PT Frequency 2x / week   PT Duration 8 weeks   PT Treatment/Interventions ADLs/Self Care Home Management;Patient/family education;Therapeutic activities;Therapeutic exercise;Cryotherapy;Moist Heat   PT Next Visit Plan 1x visit per medicaid    PT Home Exercise Plan See patient instructions    Consulted and Agree with Plan of Care Patient      Patient will benefit from skilled therapeutic intervention in order to improve the following deficits and impairments:  Abnormal gait, Decreased strength, Decreased mobility, Decreased activity tolerance, Increased fascial restricitons, Increased muscle spasms, Decreased knowledge of use of DME  Visit Diagnosis: Lumbar spondylosis - Plan: PT plan of care cert/re-cert  Acute bilateral low back pain with left-sided sciatica - Plan: PT plan of care cert/re-cert  Muscle weakness (generalized) - Plan: PT plan of care cert/re-cert     Problem List Patient Active Problem List   Diagnosis Date Noted  . Encounter for IUD insertion 08/14/2015  . Dysmenorrhea 10/31/2012  . Abnormal uterine bleeding (AUB) 10/31/2012    Marissa Smith PT DPT  02/21/2016, 12:48 PM  [facility] [address] [place], [place], [address] Phone: [phone]   Fax:  [phone]  Name: Jaynee EaglesLindsay O Lorenz MRN: [id number] Date of Birth: [birth year]

## 2016-02-22 ENCOUNTER — Encounter (HOSPITAL_COMMUNITY): Payer: Self-pay | Admitting: Emergency Medicine

## 2016-02-22 ENCOUNTER — Emergency Department (HOSPITAL_COMMUNITY)
Admission: EM | Admit: 2016-02-22 | Discharge: 2016-02-23 | Disposition: A | Discharge status: Home / Self Care | Payer: Medicaid Other | Attending: Emergency Medicine | Admitting: Emergency Medicine

## 2016-02-22 DIAGNOSIS — Y939 Activity, unspecified: Secondary | ICD-10-CM | POA: Diagnosis not present

## 2016-02-22 DIAGNOSIS — Y999 Unspecified external cause status: Secondary | ICD-10-CM | POA: Insufficient documentation

## 2016-02-22 DIAGNOSIS — M5442 Lumbago with sciatica, left side: Secondary | ICD-10-CM | POA: Insufficient documentation

## 2016-02-22 DIAGNOSIS — M545 Low back pain: Secondary | ICD-10-CM

## 2016-02-22 DIAGNOSIS — X501XXA Overexertion from prolonged static or awkward postures, initial encounter: Secondary | ICD-10-CM | POA: Insufficient documentation

## 2016-02-22 DIAGNOSIS — M5441 Lumbago with sciatica, right side: Secondary | ICD-10-CM | POA: Insufficient documentation

## 2016-02-22 DIAGNOSIS — Y929 Unspecified place or not applicable: Secondary | ICD-10-CM | POA: Insufficient documentation

## 2016-02-22 DIAGNOSIS — G8929 Other chronic pain: Secondary | ICD-10-CM | POA: Insufficient documentation

## 2016-02-22 MED ORDER — KETOROLAC TROMETHAMINE 60 MG/2ML IM SOLN
30.0000 mg | Freq: Once | INTRAMUSCULAR | Status: AC
  Administered 2016-02-22: 30 mg via INTRAMUSCULAR
  Filled 2016-02-22: qty 2

## 2016-02-22 MED ORDER — GABAPENTIN 300 MG PO CAPS: 300.0000 mg | ORAL_CAPSULE | Freq: Three times a day (TID) | ORAL | 0 refills | Status: DC

## 2016-02-22 NOTE — Discharge Instructions (Signed)
Continue taking pain medications at home for inflammation and low back pain Take gabapentin (neurotin) three times daily, or less frequent, for pain Do not take gabapentin if you are going to drink alcohol or drive a vehicle Return to ED if you develop lower extremity weakness, numbness, fever Follow up with your primary care provider who will reassess your MRI results and decide on further specialist referral and/or long term treatment for pain

## 2016-02-22 NOTE — ED Provider Notes (Signed)
[facility]PT Provider Note   CSN: [id number] Arrival date & time: 02/22/16  2110  History   Chief Complaint Chief Complaint  Patient presents with  . Back Pain   HPI Marissa Smith is a 30 y.o. female with pmh of chronic low back pain (seen in ED for initial encounter on 12/24/2015) who presents with ED with increased low back pain non responsive to current pain med regimen (meloxicam, robaxin and diclofenac).  Pt reports standing, sitting and laying down on her back for prolonged periods of time worsen her pain.  Pain is described as a burning/sharp pain that starts at her bil hips and radiates to R and L posterior mid thighs. She denies fevers, bladder retention/incontinence, groin numbness, lower extremity weakness. No N/V/D/C, no urinary symptoms.  No CP, SOB.   Pt states this pain is similar to her chronic low back pain, but greater in intensity.  Pt states she received a steroid shot  2 days ago which helped at first.  Had PT appointment 1 day ago which pt thinks exacerbated her pain.  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    HPI  Past Medical History:  Diagnosis Date  . Back pain, chronic   . Cat allergies   . Depressed bipolar I disorder (HCC)   . Seasonal allergies     Patient Active Problem List   Diagnosis Date Noted  . Encounter for IUD insertion 08/14/2015  . Dysmenorrhea 10/31/2012  . Abnormal uterine bleeding (AUB) 10/31/2012    Past Surgical History:  Procedure Laterality Date  . CHOLECYSTECTOMY  2010   lap chole    OB History    Gravida Para Term Preterm AB Living   7 3 3  0 1 3   SAB TAB Ectopic Multiple Live Births   0 0 0 0 3       Home Medications    Prior to Admission medications   Medication Sig Start Date End Date Taking? Authorizing Provider  acetaminophen (TYLENOL) 500 MG tablet Take 500-1,000 mg by mouth 2 (two) times daily as needed for moderate pain.    Historical Provider, MD    amoxicillin-clavulanate (AUGMENTIN) 875-125 MG tablet Take 1 tablet by mouth 2 (two) times daily. One po bid x 7 days Patient not taking: Reported on 01/18/2016 11/27/15   Mercedes Camprubi-Soms, PA-C  cetirizine (ZYRTEC) 10 MG tablet Take 10 mg by mouth daily as needed for allergies. 09/23/15   Historical Provider, MD  diclofenac (CATAFLAM) 50 MG tablet Take 1 tablet (50 mg total) by mouth 3 (three) times daily. For back pain 02/18/16   Linna HoffJames D Kindl, MD  Fluoxetine HCl, PMDD, (SARAFEM) 10 MG TABS Take 1 tablet (10 mg total) by mouth 2 (two) times daily. Start taking 14 days before onset of period and continue until the 1st period day. 08/14/15   Rachelle A Denney, CNM  HYDROcodone-acetaminophen (NORCO) 7.5-325 MG tablet Take 1 tablet by mouth every 6 (six) hours as needed for moderate pain. Patient not taking: Reported on 01/18/2016 12/24/15   Tharon AquasFrank C Patrick, PA  HYDROcodone-acetaminophen (NORCO/VICODIN) 5-325 MG tablet Take 2 tablets by mouth every 4 (four) hours as needed. Patient not taking: Reported on 01/18/2016 10/23/15   Bethel BornKelly Marie Gekas, PA-C  ibuprofen (ADVIL,MOTRIN) 800 MG tablet Take 1 tablet (800 mg total) by mouth 3 (three) times daily. 08/14/15   Rachelle A Denney, CNM  levonorgestrel (LILETTA, 52 MG,) 18.6 MCG/DAY  IUD IUD 1 each by Intrauterine route once.    Historical Provider, MD  meloxicam (MOBIC) 15 MG tablet Take 1 tablet (15 mg total) by mouth daily as needed for pain. 01/18/16   Raeford Razor, MD  methylPREDNISolone (MEDROL DOSEPAK) 4 MG TBPK tablet follow package directions, start on wed, take until finished 02/18/16   Linna Hoff, MD  Multiple Vitamin (MULTIVITAMIN) tablet Take 1 tablet by mouth daily.    Historical Provider, MD  ondansetron (ZOFRAN) 4 MG tablet Take 1 tablet (4 mg total) by mouth every 6 (six) hours. 11/22/15   Tyrone Nine, MD  oxyCODONE-acetaminophen (PERCOCET/ROXICET) 5-325 MG tablet Take 1 tablet by mouth every 4 (four) hours as needed for severe pain. 01/18/16    Raeford Razor, MD  Specialty Vitamins Products (ECHINACEA C COMPLETE PO) Take 1 tablet by mouth daily.     Historical Provider, MD    Family History Family History  Problem Relation Age of Onset  . Heart failure Father     Social History Social History  Substance Use Topics  . Smoking status: Light Tobacco Smoker    Packs/day: 0.25    Types: Cigarettes  . Smokeless tobacco: Never Used  . Alcohol use No     Allergies   Darvocet [propoxyphene n-acetaminophen] and Miconazole nitrate   Review of Systems Review of Systems  Constitutional: Negative for fever.  HENT: Negative for congestion and sore throat.   Eyes: Negative for photophobia.  Respiratory: Negative for cough, chest tightness and shortness of breath.   Cardiovascular: Negative for chest pain and leg swelling.  Gastrointestinal: Negative for abdominal pain, constipation, diarrhea and nausea.  Genitourinary: Negative for dysuria, flank pain, frequency, hematuria, pelvic pain and urgency.  Musculoskeletal: Positive for back pain (chronic burning low back pain radiating to bilateral posterior mid upper legs). Negative for joint swelling.  Skin: Negative for rash.  Neurological: Negative for weakness, numbness (pt states she has chronic L hand/finger numbness from pinched nerve.  No numbness,weakness of lower extremities) and headaches.  Psychiatric/Behavioral: Negative.      Physical Exam Updated Vital Signs BP (!) 114/52   Pulse 115   Temp 97.8 F (36.6 C) (Oral)   Resp 18   Ht 5\' 4"  (1.626 m)   Wt 75.8 kg   SpO2 100%   BMI 28.67 kg/m   Physical Exam  Constitutional: She is oriented to person, place, and time. She appears well-developed and well-nourished. No distress.  Pt is found prone on bed facing the door.   HENT:  Nose: Nose normal.  Mouth/Throat: Oropharynx is clear and moist. No oropharyngeal exudate.  Eyes: Conjunctivae are normal. Pupils are equal, round, and reactive to light.  Neck: Neck  supple.  Cardiovascular: Normal rate, regular rhythm, normal heart sounds and intact distal pulses.   Pulmonary/Chest: Effort normal and breath sounds normal. No respiratory distress.  Abdominal: Soft. Bowel sounds are normal. She exhibits no distension. There is no tenderness. There is no guarding.  Musculoskeletal:  Muscle mass and tone appropriate and symmetric.  Full active ROM of neck, shoulders, elbows, hip, knee and ankles.  Muscle strength 5/5 and equal throughout.  Tenderness reported with deep palpation over lumbar spine and over paraspinal muscles, increased muscle tone/rigidity at L paraspinal muscles at lumbar level compared to R. Tenderness at SI joints bilaterally and over L sciatic notch.    Lymphadenopathy:    She has no cervical adenopathy.  Neurological: She is alert and oriented to person, place, and time.  Strength and sensation to light touch intact at hips, knees, ankles.  + SLR bilaterally with reported burning pain radiating to posterior upper leg bilaterally.  Skin: Skin is warm and dry.     Psychiatric: She has a normal mood and affect.     ED Treatments / Results  Labs (all labs ordered are listed, but only abnormal results are displayed) Labs Reviewed - No data to display  EKG  EKG Interpretation None       Radiology No results found.  MRI Lumbar Spine WO contrast 02/14/16 Result Impression  IMPRESSION: 1.Transitional S1 vertebral body with other levels numbered accordingly. 2.Mild degenerative disc disease L5-S1 with apparent Modic degenerative endplate changes and mild to moderate facet changes of the lower lumbar spine with tiny facet joint cyst on the left at L5-S1.Marland Kitchen. 3.Small to moderate disc protrusion impinges upon the traversing left S1 root at L5-S1. 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     Procedures Procedures (including critical care time)  Medications Ordered in ED Medications  ketorolac (TORADOL) injection 30 mg (30 mg Intramuscular Given 02/22/16 2232)     Initial Impression / Assessment and Plan / ED Course  I have reviewed the triage vital signs and the nursing notes.  Pertinent labs & imaging results that were available during my care of the patient were reviewed by me and considered in my medical decision making (see chart for details).  Clinical Course as of Feb 22 2351  Sat Feb 22, 2016  2329 Pt reports no improvement in burning pain after toradol.   [CG]  2349 Pt agreeable to discharge with gabapentin and close follow up with PCP.  [CG]    Clinical Course User Index [CG] Liberty Handylaudia J Rakisha Pincock, PA-C   30 yo female with chronic back pain presents with increased back pain non responsive to home pain meds (meloxicam, robaxin, diclofenac).  No abdominal pain, N/V/D/C, no urinary symptoms, no fevers, no recent new trauma to area, no new focal neurological complaints.  No red flags on exam today, pt is afebrile, no tenderness over lumbar spinous processes, no lower extremity weakness/decreased strength or ROM.  Presentation most c/w with acute on chronic flare of LBP due to recent PT appointment/exercises yesterday. Kidney stone, UTI, GI pathology are not likely.  Most recent MRI on 02/14/16 reviewed, see above for findings.  Pt does not need lab work up or more imaging at this point as I suspect this is a flare up of her chronic LBP.  Pt reports minimal pain relief after toradol in ED. At this point I do not think it's realistic to expect significant decrease of LBP in ED.  Explained ED narcotic protocol to pt, who was frustrated but understood that it is not safe for me to prescribe her opioids as pain will likely come back once she runs out of narcotics.  Pt will be discharged with short course of gabapentin instead for burning, radiating LBP.  Will encourage pt to  continue pain regimen, alternate heat/pad, mild stretches, and follow up with PCP for long term pain control alternatives.  Pt agreeable to plan.  Final Clinical Impressions(s) / ED Diagnoses   Final diagnoses:  None    New Prescriptions New Prescriptions   No medications on file     Liberty HandyClaudia J Venisha Boehning, PA-C 02/23/16 0032    Pricilla LovelessScott Goldston, MD 02/23/16 1649

## 2016-02-22 NOTE — ED Triage Notes (Signed)
Back pain for a month and a half. Has had MRI and started PT on 11/9. Pain has increased since then. Feels burning pain in low back and numbness in toes. Able to stand. No loss of control of bowel or bladder. Tried Meloxicam and Diclofenac today without relief.

## 2016-02-23 NOTE — ED Notes (Signed)
Pt stable, understands discharge instructions, and reasons for return.   

## 2016-02-27 ENCOUNTER — Encounter (HOSPITAL_COMMUNITY): Payer: Self-pay | Admitting: Emergency Medicine

## 2016-02-27 ENCOUNTER — Emergency Department (HOSPITAL_COMMUNITY)
Admission: EM | Admit: 2016-02-27 | Discharge: 2016-02-27 | Disposition: A | Discharge status: Home / Self Care | Payer: Medicaid Other | Attending: Emergency Medicine | Admitting: Emergency Medicine

## 2016-02-27 DIAGNOSIS — M5442 Lumbago with sciatica, left side: Secondary | ICD-10-CM | POA: Insufficient documentation

## 2016-02-27 DIAGNOSIS — M5432 Sciatica, left side: Secondary | ICD-10-CM

## 2016-02-27 DIAGNOSIS — F17210 Nicotine dependence, cigarettes, uncomplicated: Secondary | ICD-10-CM | POA: Insufficient documentation

## 2016-02-27 DIAGNOSIS — M549 Dorsalgia, unspecified: Secondary | ICD-10-CM | POA: Diagnosis present

## 2016-02-27 MED ORDER — OXYCODONE-ACETAMINOPHEN 5-325 MG PO TABS
2.0000 | ORAL_TABLET | Freq: Once | ORAL | Status: AC
  Administered 2016-02-27: 2 via ORAL
  Filled 2016-02-27: qty 2

## 2016-02-27 MED ORDER — ONDANSETRON 4 MG PO TBDP
4.0000 mg | ORAL_TABLET | Freq: Once | ORAL | Status: AC
  Administered 2016-02-27: 4 mg via ORAL
  Filled 2016-02-27: qty 1

## 2016-02-27 MED ORDER — ONDANSETRON HCL 4 MG/2ML IJ SOLN
4.0000 mg | Freq: Once | INTRAMUSCULAR | Status: DC
Start: 2016-02-27 — End: 2016-02-27

## 2016-02-27 NOTE — Discharge Instructions (Signed)
SEEK IMMEDIATE MEDICAL ATTENTION IF: °New numbness, tingling, weakness, or problem with the use of your arms or legs.  °Severe back pain not relieved with medications.  °Change in bowel or bladder control.  °Increasing pain in any areas of the body (such as chest or abdominal pain).  °Shortness of breath, dizziness or fainting.  °Nausea (feeling sick to your stomach), vomiting, fever, or sweats. ° ° °Chronic Pain Discharge Instructions  °Emergency care providers appreciate that many patients coming to us are in severe pain and we wish to address their pain in the safest, most responsible manner.  It is important to recognize however, that the proper treatment of chronic pain differs from that of the pain of injuries and acute illnesses.  Our goal is to provide quality, safe, personalized care and we thank you for giving us the opportunity to serve you. °The use of narcotics and related agents for chronic pain syndromes [place] lead to additional physical and psychological problems.  Nearly as many people die from prescription narcotics each year as die from car crashes.  Additionally, this risk is increased if such prescriptions are obtained from a variety of sources.  Therefore, only your primary care physician or a pain management specialist is able to safely treat such syndromes with narcotic medications long-term.   ° °Documentation revealing such prescriptions have been sought from multiple sources [place] prohibit us from providing a refill or different narcotic medication.  Your name [place] be checked first through the [place] Controlled Substances Reporting System.  This database is a record of controlled substance medication prescriptions that the patient has received.  This has been established by [place] in an effort to eliminate the dangerous, and often life threatening, practice of obtaining multiple prescriptions from different medical providers.  ° °If you have a chronic pain syndrome (i.e. chronic  headaches, recurrent back or neck pain, dental pain, abdominal or pelvis pain without a specific diagnosis, or neuropathic pain such as fibromyalgia) or recurrent visits for the same condition without an acute diagnosis, you [place] be treated with non-narcotics and other non-addictive medicines.  Allergic reactions or negative side effects that [place] be reported by a patient to such medications will not typically lead to the use of a narcotic analgesic or other controlled substance as an alternative. °  °Patients managing chronic pain with a personal physician should have provisions in place for breakthrough pain.  If you are in crisis, you should call your physician.  If your physician directs you to the emergency department, please have the doctor call and speak to our attending physician concerning your care. °  °When patients come to the Emergency Department (ED) with acute medical conditions in which the Emergency Department physician feels appropriate to prescribe narcotic or sedating pain medication, the physician will prescribe these in very limited quantities.  The amount of these medications will last only until you can see your primary care physician in his/her office.  Any patient who returns to the ED seeking refills should expect only non-narcotic pain medications.  ° °In the event of an acute medical condition exists and the emergency physician feels it is necessary that the patient be given a narcotic or sedating medication -  a responsible adult driver should be present in the room prior to the medication being given by the nurse. °  °Prescriptions for narcotic or sedating medications that have been lost, stolen or expired will not be refilled in the Emergency Department.   ° °Patients who   have chronic pain [place] receive non-narcotic prescriptions until seen by their primary care physician.  It is every patient’s personal responsibility to maintain active prescriptions with his or her primary care  physician or specialist. ° °

## 2016-02-27 NOTE — ED Triage Notes (Signed)
Pt states she was seen here for back pain over the weekend. Pt states since then the pain has moved to right leg and radiates down leg.

## 2016-02-27 NOTE — ED Provider Notes (Signed)
[facility] Provider Note   CSN: [id number] Arrival date & time: 02/27/16  [id number]  By signing my name below, I, Clovis PuAvnee Patel, attest that this documentation has been prepared under the direction and in the presence of  Arthor CaptainAbigail Osama Coleson, PA-C. Electronically Signed: Clovis PuAvnee Patel, ED Scribe. 02/27/16. 10:28 AM.   History   Chief Complaint Chief Complaint  Patient presents with  . Back Pain    The history is provided by the patient. No language interpreter was used.   HPI Comments:  Marissa Smith is a 30 y.o. female, with a hx of chronic back pain, who presents to the Emergency Department complaining of worsening, moderate back pain x 1 month. Pt states her pain radiates down to her right leg. She has taken Diclofenac, Robaxin, Mobic and had applied heat and ice with little relief. She was seen in the ED on 02/22/16 for similar symptoms. Pt denies bowel/bladder incontinence, weakness and any other associated symptoms or modifying factors at this time.  Past Medical History:  Diagnosis Date  . Back pain, chronic   . Cat allergies   . Depressed bipolar I disorder (HCC)   . Seasonal allergies     Patient Active Problem List   Diagnosis Date Noted  . Encounter for IUD insertion 08/14/2015  . Dysmenorrhea 10/31/2012  . Abnormal uterine bleeding (AUB) 10/31/2012    Past Surgical History:  Procedure Laterality Date  . CHOLECYSTECTOMY  2010   lap chole    OB History    Gravida Para Term Preterm AB Living   7 3 3  0 1 3   SAB TAB Ectopic Multiple Live Births   0 0 0 0 3       Home Medications    Prior to Admission medications   Medication Sig Start Date End Date Taking? Authorizing Provider  acetaminophen (TYLENOL) 500 MG tablet Take 500-1,000 mg by mouth 2 (two) times daily as needed for moderate pain.    Historical Provider, MD  amoxicillin-clavulanate (AUGMENTIN) 875-125 MG tablet Take 1 tablet by mouth 2 (two) times daily. One po bid x 7 days Patient not taking:  Reported on 01/18/2016 11/27/15   Mercedes Camprubi-Soms, PA-C  cetirizine (ZYRTEC) 10 MG tablet Take 10 mg by mouth daily as needed for allergies. 09/23/15   Historical Provider, MD  diclofenac (CATAFLAM) 50 MG tablet Take 1 tablet (50 mg total) by mouth 3 (three) times daily. For back pain 02/18/16   Linna HoffJames D Kindl, MD  Fluoxetine HCl, PMDD, (SARAFEM) 10 MG TABS Take 1 tablet (10 mg total) by mouth 2 (two) times daily. Start taking 14 days before onset of period and continue until the 1st period day. 08/14/15   Rachelle A Denney, CNM  gabapentin (NEURONTIN) 300 MG capsule Take 1 capsule (300 mg total) by mouth 3 (three) times daily. 02/22/16 02/27/16  Liberty Handylaudia J Gibbons, PA-C  HYDROcodone-acetaminophen (NORCO) 7.5-325 MG tablet Take 1 tablet by mouth every 6 (six) hours as needed for moderate pain. Patient not taking: Reported on 01/18/2016 12/24/15   Tharon AquasFrank C Patrick, PA  HYDROcodone-acetaminophen (NORCO/VICODIN) 5-325 MG tablet Take 2 tablets by mouth every 4 (four) hours as needed. Patient not taking: Reported on 01/18/2016 10/23/15   Bethel BornKelly Marie Gekas, PA-C  ibuprofen (ADVIL,MOTRIN) 800 MG tablet Take 1 tablet (800 mg total) by mouth 3 (three) times daily. 08/14/15   Rachelle A Denney, CNM  levonorgestrel (LILETTA, 52 MG,) 18.6 MCG/DAY IUD IUD 1 each by Intrauterine route once.    Historical Provider, MD  meloxicam (MOBIC) 15 MG tablet Take 1 tablet (15 mg total) by mouth daily as needed for pain. 01/18/16   Raeford Razor, MD  methylPREDNISolone (MEDROL DOSEPAK) 4 MG TBPK tablet follow package directions, start on wed, take until finished 02/18/16   Linna Hoff, MD  Multiple Vitamin (MULTIVITAMIN) tablet Take 1 tablet by mouth daily.    Historical Provider, MD  ondansetron (ZOFRAN) 4 MG tablet Take 1 tablet (4 mg total) by mouth every 6 (six) hours. 11/22/15   Tyrone Nine, MD  oxyCODONE-acetaminophen (PERCOCET/ROXICET) 5-325 MG tablet Take 1 tablet by mouth every 4 (four) hours as needed for severe pain.  01/18/16   Raeford Razor, MD  Specialty Vitamins Products (ECHINACEA C COMPLETE PO) Take 1 tablet by mouth daily.     Historical Provider, MD    Family History Family History  Problem Relation Age of Onset  . Heart failure Father     Social History Social History  Substance Use Topics  . Smoking status: Light Tobacco Smoker    Packs/day: 0.25    Types: Cigarettes  . Smokeless tobacco: Never Used  . Alcohol use No     Allergies   Darvocet [propoxyphene n-acetaminophen] and Miconazole nitrate   Review of Systems Review of Systems  Genitourinary:       -bladder/bowel incontinence   Musculoskeletal: Positive for back pain and myalgias.  Neurological: Negative for weakness.    Physical Exam Updated Vital Signs BP (!) 107/46 (BP Location: Left Arm)   Pulse 86   Temp 97.9 F (36.6 C) (Oral)   Resp 24   Ht 5\' 5"  (1.651 m)   Wt 78.9 kg   SpO2 100%   BMI 28.96 kg/m   Physical Exam Physical Exam  Constitutional: Pt appears well-developed and well-nourished. No distress.  HENT:  Head: Normocephalic and atraumatic.  Mouth/Throat: Oropharynx is clear and moist. No oropharyngeal exudate.  Eyes: Conjunctivae are normal.  Neck: Normal range of motion. Neck supple.  Full ROM without pain  Cardiovascular: Normal rate, regular rhythm and intact distal pulses.   Pulmonary/Chest: Effort normal and breath sounds normal. No respiratory distress. Pt has no wheezes.  Abdominal: Soft. Pt exhibits no distension. There is no tenderness.  Musculoskeletal:  Full range of motion of the T-spine and L-spine No tenderness to palpation of the spinous processes of the T-spine or L-spine Mild tenderness to palpation of the paraspinous muscles of the L-spine  Lymphadenopathy:    Pt has no cervical adenopathy.  Neurological: Pt is alert. Pt has normal reflexes.  Reflex Scores:      Bicep reflexes are 2+ on the right side and 2+ on the left side.      Brachioradialis reflexes are 2+ on the  right side and 2+ on the left side.      Patellar reflexes are 2+ on the right side and 2+ on the left side.      Achilles reflexes are 2+ on the right side and 2+ on the left side. Speech is clear and goal oriented, follows commands Normal 5/5 strength in upper and lower extremities bilaterally including dorsiflexion and plantar flexion, strong and equal grip strength Sensation normal to light and sharp touch Moves extremities without ataxia, coordination intact Normal gait Normal balance No Clonus  Skin: Skin is warm and dry. No rash noted. Pt is not diaphoretic. No erythema.  Psychiatric: Pt has a normal mood and affect. Behavior is normal.  Nursing note and vitals reviewed.  ED Treatments / Results  DIAGNOSTIC STUDIES:  Oxygen Saturation is 100% on RA, normal by my interpretation.    COORDINATION OF CARE:  10:24 AM Discussed treatment plan with pt at bedside and pt agreed to plan.  Labs (all labs ordered are listed, but only abnormal results are displayed) Labs Reviewed - No data to display  EKG  EKG Interpretation None       Radiology No results found.  Procedures Procedures (including critical care time)  Medications Ordered in ED Medications  oxyCODONE-acetaminophen (PERCOCET/ROXICET) 5-325 MG per tablet 2 tablet (2 tablets Oral Given 02/27/16 1040)  ondansetron (ZOFRAN-ODT) disintegrating tablet 4 mg (4 mg Oral Given 02/27/16 1041)     Initial Impression / Assessment and Plan / ED Course  I have reviewed the triage vital signs and the nursing notes.  Pertinent labs & imaging results that were available during my care of the patient were reviewed by me and considered in my medical decision making (see chart for details).  Clinical Course as of Feb 28 1639  Thu Feb 27, 2016  1028 Patient reviewed in [id number]  [AH]    Clinical Course User Index [AH] Arthor CaptainAbigail Mariel Lukins, PA-C    Patient with back pain.  No neurological deficits and normal neuro exam.  Patient  is ambulatory.  No loss of bowel or bladder control.  No concern for cauda equina.  No fever, night sweats, weight loss, h/o cancer, IVDA, no recent procedure to back. No urinary symptoms suggestive of UTI.  Supportive care and return precaution discussed. Appears safe for discharge at this time. Follow up as indicated in discharge paperwork.    Final Clinical Impressions(s) / ED Diagnoses   Final diagnoses:  Sciatica of left side    New Prescriptions Discharge Medication List as of 02/27/2016 10:28 AM    I personally performed the services described in this documentation, which was scribed in my presence. The recorded information has been reviewed and is accurate.        Arthor Captainbigail Alegandro Macnaughton, PA-C 02/28/16 1640    Marily MemosJason Mesner, MD 02/29/16 [phone]

## 2016-03-09 ENCOUNTER — Other Ambulatory Visit: Payer: Self-pay | Admitting: Sports Medicine

## 2016-03-09 DIAGNOSIS — M5126 Other intervertebral disc displacement, lumbar region: Secondary | ICD-10-CM

## 2016-03-24 ENCOUNTER — Ambulatory Visit
Admission: RE | Admit: 2016-03-24 | Discharge: 2016-03-24 | Disposition: A | Discharge status: Home / Self Care | Payer: Medicaid Other | Source: Ambulatory Visit | Attending: Sports Medicine | Admitting: Sports Medicine

## 2016-03-24 DIAGNOSIS — M5126 Other intervertebral disc displacement, lumbar region: Secondary | ICD-10-CM

## 2016-03-24 MED ORDER — METHYLPREDNISOLONE ACETATE 40 MG/ML INJ SUSP (RADIOLOG
120.0000 mg | Freq: Once | INTRAMUSCULAR | Status: AC
  Administered 2016-03-24: 120 mg via EPIDURAL

## 2016-03-24 MED ORDER — IOPAMIDOL (ISOVUE-M 200) INJECTION 41%
1.0000 mL | Freq: Once | INTRAMUSCULAR | Status: AC
  Administered 2016-03-24: 1 mL via EPIDURAL

## 2016-03-24 NOTE — Discharge Instructions (Signed)
Post Procedure Spinal Discharge Instruction Sheet ° °1. You Kracke resume a regular diet and any medications that you routinely take (including pain medications). ° °2. No driving day of procedure. ° °3. Light activity throughout the rest of the day.  Do not do any strenuous work, exercise, bending or lifting.  The day following the procedure, you can resume normal physical activity but you should refrain from exercising or physical therapy for at least three days thereafter. ° ° °Common Side Effects: ° °· Headaches- take your usual medications as directed by your physician.  Increase your fluid intake.  Caffeinated beverages Cary be helpful.  Lie flat in bed until your headache resolves. ° °· Restlessness or inability to sleep- you Hay have trouble sleeping for the next few days.  Ask your referring physician if you need any medication for sleep. ° °· Facial flushing or redness- should subside within a few days. ° °· Increased pain- a temporary increase in pain a day or two following your procedure is not unusual.  Take your pain medication as prescribed by your referring physician. ° °· Leg cramps ° °Please contact our office at [phone] for the following symptoms: °· Fever greater than 100 degrees. °· Headaches unresolved with medication after 2-3 days. °· Increased swelling, pain, or redness at injection site. ° °Thank you for visiting our office. °

## 2016-04-08 ENCOUNTER — Other Ambulatory Visit: Payer: Self-pay | Admitting: Sports Medicine

## 2016-04-08 DIAGNOSIS — M5126 Other intervertebral disc displacement, lumbar region: Secondary | ICD-10-CM

## 2016-04-16 ENCOUNTER — Inpatient Hospital Stay: Admission: RE | Admit: 2016-04-16 | Payer: Medicaid Other | Source: Ambulatory Visit

## 2016-05-31 ENCOUNTER — Inpatient Hospital Stay (HOSPITAL_COMMUNITY): Payer: Medicaid Other

## 2016-05-31 ENCOUNTER — Encounter (HOSPITAL_COMMUNITY): Payer: Self-pay | Admitting: *Deleted

## 2016-05-31 ENCOUNTER — Inpatient Hospital Stay (HOSPITAL_COMMUNITY)
Admission: AD | Admit: 2016-05-31 | Discharge: 2016-05-31 | Disposition: A | Discharge status: Home / Self Care | Payer: Medicaid Other | Source: Ambulatory Visit | Attending: Obstetrics & Gynecology | Admitting: Obstetrics & Gynecology

## 2016-05-31 DIAGNOSIS — Z3202 Encounter for pregnancy test, result negative: Secondary | ICD-10-CM | POA: Diagnosis not present

## 2016-05-31 DIAGNOSIS — T8332XA Displacement of intrauterine contraceptive device, initial encounter: Secondary | ICD-10-CM | POA: Insufficient documentation

## 2016-05-31 DIAGNOSIS — R102 Pelvic and perineal pain: Secondary | ICD-10-CM | POA: Diagnosis not present

## 2016-05-31 DIAGNOSIS — Y848 Other medical procedures as the cause of abnormal reaction of the patient, or of later complication, without mention of misadventure at the time of the procedure: Secondary | ICD-10-CM | POA: Diagnosis not present

## 2016-05-31 DIAGNOSIS — R1030 Lower abdominal pain, unspecified: Secondary | ICD-10-CM

## 2016-05-31 DIAGNOSIS — F17210 Nicotine dependence, cigarettes, uncomplicated: Secondary | ICD-10-CM | POA: Diagnosis not present

## 2016-05-31 DIAGNOSIS — Z79899 Other long term (current) drug therapy: Secondary | ICD-10-CM | POA: Insufficient documentation

## 2016-05-31 LAB — URINALYSIS, ROUTINE W REFLEX MICROSCOPIC
Bacteria, UA: NONE SEEN
Bilirubin Urine: NEGATIVE
Glucose, UA: NEGATIVE mg/dL
Hgb urine dipstick: NEGATIVE
Ketones, ur: NEGATIVE mg/dL
Nitrite: NEGATIVE
Protein, ur: NEGATIVE mg/dL
Specific Gravity, Urine: 1.018 (ref 1.005–1.030)
pH: 8 (ref 5.0–8.0)

## 2016-05-31 LAB — WET PREP, GENITAL
Clue Cells Wet Prep HPF POC: NONE SEEN
Sperm: NONE SEEN
Trich, Wet Prep: NONE SEEN
Yeast Wet Prep HPF POC: NONE SEEN

## 2016-05-31 LAB — CBC
HCT: 42.9 % (ref 36.0–46.0)
Hemoglobin: 14.6 g/dL (ref 12.0–15.0)
MCH: 32.3 pg (ref 26.0–34.0)
MCHC: 34 g/dL (ref 30.0–36.0)
MCV: 94.9 fL (ref 78.0–100.0)
Platelets: 235 10*3/uL (ref 150–400)
RBC: 4.52 MIL/uL (ref 3.87–5.11)
RDW: 13.4 % (ref 11.5–15.5)
WBC: 6.3 10*3/uL (ref 4.0–10.5)

## 2016-05-31 LAB — POCT PREGNANCY, URINE: Preg Test, Ur: NEGATIVE

## 2016-05-31 MED ORDER — KETOROLAC TROMETHAMINE 30 MG/ML IJ SOLN
30.0000 mg | Freq: Once | INTRAMUSCULAR | Status: AC
  Administered 2016-05-31: 30 mg via INTRAMUSCULAR
  Filled 2016-05-31: qty 1

## 2016-05-31 MED ORDER — OXYCODONE HCL 5 MG PO TABS
5.0000 mg | ORAL_TABLET | Freq: Once | ORAL | Status: AC
  Administered 2016-05-31: 5 mg via ORAL
  Filled 2016-05-31: qty 1

## 2016-05-31 MED ORDER — ONDANSETRON 4 MG PO TBDP
4.0000 mg | ORAL_TABLET | Freq: Once | ORAL | Status: AC
  Administered 2016-05-31: 4 mg via ORAL
  Filled 2016-05-31: qty 1

## 2016-05-31 NOTE — MAU Note (Signed)
Pt reports lower abd pain off/on for 3 weeks, nausea off/on, fatigue, headache. States she has an IUD but "they don't seem to work very well" for her.

## 2016-05-31 NOTE — MAU Provider Note (Signed)
History     CSN: [id number]  Arrival date and time: 05/31/16 1230   First Provider Initiated Contact with Patient 05/31/16 1312     Non-pregnant female here with lower abdominal cramping x2 weeks. Describes as constant pain but cramping is intermittent and rates pain 7-9/10. She has used Ibuprofen, Aleve, Vicodin, and Tylenol and had minimal relief. Heating pad helped some. Pain caused N/V last night. No fevers. She denies vaginal discharge. No new partners.  She is contracepting with Liletta. She had placed in 08/2015 and reports insertion was painful compared to previous IUD.    Pertinent Gynecological History: Menses: irregular spotting Contraception: IUD Blood transfusions: none Sexually transmitted diseases: past history: CT Last pap: abnormal: ASCUS/+HPV Date: 10/2014   Past Medical History:  Diagnosis Date  . Back pain, chronic   . Cat allergies   . Depressed bipolar I disorder (HCC)   . Seasonal allergies     Past Surgical History:  Procedure Laterality Date  . CHOLECYSTECTOMY  2010   lap chole    Family History  Problem Relation Age of Onset  . Heart failure Father     Social History  Substance Use Topics  . Smoking status: Light Tobacco Smoker    Packs/day: 0.25    Types: Cigarettes  . Smokeless tobacco: Never Used  . Alcohol use No    Allergies:  Allergies  Allergen Reactions  . Darvocet [Propoxyphene N-Acetaminophen] Nausea And Vomiting and Other (See Comments)    Reaction:  Dizziness   . Miconazole Nitrate Swelling and Rash    Prescriptions Prior to Admission  Medication Sig Dispense Refill Last Dose  . acetaminophen (TYLENOL) 500 MG tablet Take 500-1,000 mg by mouth 2 (two) times daily as needed for moderate pain.   01/17/2016  . amoxicillin-clavulanate (AUGMENTIN) 875-125 MG tablet Take 1 tablet by mouth 2 (two) times daily. One po bid x 7 days (Patient not taking: Reported on 01/18/2016) 14 tablet 0 Completed Course at Unknown time  . cetirizine  (ZYRTEC) 10 MG tablet Take 10 mg by mouth daily as needed for allergies.  5 01/17/2016  . diclofenac (CATAFLAM) 50 MG tablet Take 1 tablet (50 mg total) by mouth 3 (three) times daily. For back pain 15 tablet 0   . Fluoxetine HCl, PMDD, (SARAFEM) 10 MG TABS Take 1 tablet (10 mg total) by mouth 2 (two) times daily. Start taking 14 days before onset of period and continue until the 1st period day. 60 each 12 01/17/2016  . gabapentin (NEURONTIN) 300 MG capsule Take 1 capsule (300 mg total) by mouth 3 (three) times daily. 15 capsule 0   . HYDROcodone-acetaminophen (NORCO) 7.5-325 MG tablet Take 1 tablet by mouth every 6 (six) hours as needed for moderate pain. (Patient not taking: Reported on 01/18/2016) 30 tablet 0 Completed Course at Unknown time  . HYDROcodone-acetaminophen (NORCO/VICODIN) 5-325 MG tablet Take 2 tablets by mouth every 4 (four) hours as needed. (Patient not taking: Reported on 01/18/2016) 6 tablet 0 Completed Course at Unknown time  . ibuprofen (ADVIL,MOTRIN) 800 MG tablet Take 1 tablet (800 mg total) by mouth 3 (three) times daily. 90 tablet 3 01/17/2016  . levonorgestrel (LILETTA, 52 MG,) 18.6 MCG/DAY IUD IUD 1 each by Intrauterine route once.   2017  . meloxicam (MOBIC) 15 MG tablet Take 1 tablet (15 mg total) by mouth daily as needed for pain. 10 tablet 0   . methylPREDNISolone (MEDROL DOSEPAK) 4 MG TBPK tablet follow package directions, start on wed, take until finished  21 tablet 0   . Multiple Vitamin (MULTIVITAMIN) tablet Take 1 tablet by mouth daily.   01/17/2016  . ondansetron (ZOFRAN) 4 MG tablet Take 1 tablet (4 mg total) by mouth every 6 (six) hours. 12 tablet 0 PRN at Unknown time  . oxyCODONE-acetaminophen (PERCOCET/ROXICET) 5-325 MG tablet Take 1 tablet by mouth every 4 (four) hours as needed for severe pain. 10 tablet 0   . Specialty Vitamins Products (ECHINACEA C COMPLETE PO) Take 1 tablet by mouth daily.    01/17/2016    Review of Systems  Constitutional: Negative for  chills and fever.  Gastrointestinal: Positive for abdominal pain, nausea and vomiting. Negative for constipation and diarrhea.  Genitourinary: Negative for dysuria, hematuria, urgency, vaginal bleeding and vaginal discharge.  Musculoskeletal: Positive for back pain.  Neurological: Positive for headaches.   Physical Exam   Blood pressure 122/71, pulse 69, temperature 98.5 F (36.9 C), temperature source Oral, resp. rate 17, height 5\' 5"  (1.651 m), weight 76.2 kg (168 lb), SpO2 100 %.  Physical Exam  Nursing note and vitals reviewed. Constitutional: She is oriented to person, place, and time. She appears well-developed and well-nourished. No distress (appears comfortable).  HENT:  Head: Normocephalic.  Neck: Normal range of motion.  Cardiovascular: Normal rate.   Respiratory: Effort normal.  GI: Soft. She exhibits no distension and no mass. There is tenderness (periumbilicual, suprapubic). There is no rebound and no guarding.  Genitourinary:  Genitourinary Comments: External: no lesions or erythema Vagina: rugated, parous, scant white discharge, no strings seen Uterus: non enlarged, anteverted, mildly tender, no CMT Adnexae: no masses, no tenderness left, no tenderness right   Musculoskeletal: Normal range of motion.  Neurological: She is alert and oriented to person, place, and time.  Skin: Skin is warm and dry.  Psychiatric: She has a normal mood and affect.   Results for orders placed or performed during the hospital encounter of 05/31/16 (from the past 24 hour(s))  Urinalysis, Routine w reflex microscopic     Status: Abnormal   Collection Time: 05/31/16 12:35 PM  Result Value Ref Range   Color, Urine YELLOW YELLOW   APPearance HAZY (A) CLEAR   Specific Gravity, Urine 1.018 1.005 - 1.030   pH 8.0 5.0 - 8.0   Glucose, UA NEGATIVE NEGATIVE mg/dL   Hgb urine dipstick NEGATIVE NEGATIVE   Bilirubin Urine NEGATIVE NEGATIVE   Ketones, ur NEGATIVE NEGATIVE mg/dL   Protein, ur  NEGATIVE NEGATIVE mg/dL   Nitrite NEGATIVE NEGATIVE   Leukocytes, UA TRACE (A) NEGATIVE   RBC / HPF 0-5 0 - 5 RBC/hpf   WBC, UA 0-5 0 - 5 WBC/hpf   Bacteria, UA NONE SEEN NONE SEEN   Squamous Epithelial / LPF 6-30 (A) NONE SEEN  Pregnancy, urine POC     Status: None   Collection Time: 05/31/16 12:47 PM  Result Value Ref Range   Preg Test, Ur NEGATIVE NEGATIVE  Wet prep, genital     Status: Abnormal   Collection Time: 05/31/16  1:36 PM  Result Value Ref Range   Yeast Wet Prep HPF POC NONE SEEN NONE SEEN   Trich, Wet Prep NONE SEEN NONE SEEN   Clue Cells Wet Prep HPF POC NONE SEEN NONE SEEN   WBC, Wet Prep HPF POC FEW (A) NONE SEEN   Sperm NONE SEEN   CBC     Status: None   Collection Time: 05/31/16  1:55 PM  Result Value Ref Range   WBC 6.3 4.0 - 10.5 K/uL  RBC 4.52 3.87 - 5.11 MIL/uL   Hemoglobin 14.6 12.0 - 15.0 g/dL   HCT 13.042.9 86.536.0 - 78.446.0 %   MCV 94.9 78.0 - 100.0 fL   MCH 32.3 26.0 - 34.0 pg   MCHC 34.0 30.0 - 36.0 g/dL   RDW 69.613.4 29.511.5 - 28.415.5 %   Platelets 235 150 - 400 K/uL   Dg Abd 1 View  Addendum Date: 05/31/2016   ADDENDUM REPORT: 05/31/2016 16:59 ADDENDUM: Upon further review, there is a second image associated with this examination which [place] not have been present at the time of the regional interpretation. The second images centered on the pelvis and demonstrates the inferior aspect of the pelvis. No IUD is identified. These results were discussed by telephone at the time of interpretation on 05/31/2016 at 4:58 pm to Dr. Donette LarryMELANIE Jarris Kortz, who verbally acknowledged these results. Electronically Signed   By: Trudie Reedaniel  Entrikin M.D.   On: 05/31/2016 16:59   Result Date: 05/31/2016 CLINICAL DATA:  Lower abdominal pain off and on for 3 weeks. Reported history of IUD. EXAM: ABDOMEN - 1 VIEW COMPARISON:  Abdomen and pelvis CT 05/24/2015. FINDINGS: Supine abdomen shows mild gaseous distention of small bowel in the left abdomen with a relative paucity of bowel gas in the right  abdomen. Surgical clips right upper quadrant compatible with prior cholecystectomy. Density overlying the right abdomen just above the iliac crest are nonspecific and Farewell be bowel contents or even external to the patient. Visualized bony anatomy unremarkable. IMPRESSION: 1. Mild gaseous small bowel distention in the left abdomen with a relative paucity of right abdominal bowel gas. 2. No evidence for IUD over the visualized pelvis although the extreme inferior pelvis has not been included on the film. Electronically Signed: By: Kennith CenterEric  Mansell M.D. On: 05/31/2016 16:27   [place] Transvaginal Non-ob  Result Date: 05/31/2016 CLINICAL DATA:  Patient with pelvic pain. IUD is not able to be visualized. EXAM: TRANSABDOMINAL AND TRANSVAGINAL ULTRASOUND OF PELVIS DOPPLER ULTRASOUND OF OVARIES TECHNIQUE: Both transabdominal and transvaginal ultrasound examinations of the pelvis were performed. Transabdominal technique was performed for global imaging of the pelvis including uterus, ovaries, adnexal regions, and pelvic cul-de-sac. It was necessary to proceed with endovaginal exam following the transabdominal exam to visualize the endometrium and adnexal structures. Color and duplex Doppler ultrasound was utilized to evaluate blood flow to the ovaries. COMPARISON:  CT abdomen pelvis 05/24/2015 FINDINGS: Uterus Measurements: 8.4 x 4.6 x 6.2 cm. No fibroids or other mass visualized. Endometrium Thickness: 7 mm. No focal abnormality visualized. No IUD is present. Right ovary Measurements: 3.9 x 3.3 x 2.7 cm. Normal appearance/no adnexal mass. Left ovary Measurements: 3.3 x 1.5 x 1.9 cm. Normal appearance/no adnexal mass. Pulsed Doppler evaluation of both ovaries demonstrates normal low-resistance arterial and venous waveforms. Other findings No abnormal free fluid. IMPRESSION: No IUD is visualized. No sonographic evidence for ovarian torsion. No acute process. Electronically Signed   By: Annia Beltrew  Davis M.D.   On: 05/31/2016 15:26    [place] Pelvis Complete  Result Date: 05/31/2016 CLINICAL DATA:  Patient with pelvic pain. IUD is not able to be visualized. EXAM: TRANSABDOMINAL AND TRANSVAGINAL ULTRASOUND OF PELVIS DOPPLER ULTRASOUND OF OVARIES TECHNIQUE: Both transabdominal and transvaginal ultrasound examinations of the pelvis were performed. Transabdominal technique was performed for global imaging of the pelvis including uterus, ovaries, adnexal regions, and pelvic cul-de-sac. It was necessary to proceed with endovaginal exam following the transabdominal exam to visualize the endometrium and adnexal structures. Color and duplex Doppler ultrasound  was utilized to evaluate blood flow to the ovaries. COMPARISON:  CT abdomen pelvis 05/24/2015 FINDINGS: Uterus Measurements: 8.4 x 4.6 x 6.2 cm. No fibroids or other mass visualized. Endometrium Thickness: 7 mm. No focal abnormality visualized. No IUD is present. Right ovary Measurements: 3.9 x 3.3 x 2.7 cm. Normal appearance/no adnexal mass. Left ovary Measurements: 3.3 x 1.5 x 1.9 cm. Normal appearance/no adnexal mass. Pulsed Doppler evaluation of both ovaries demonstrates normal low-resistance arterial and venous waveforms. Other findings No abnormal free fluid. IMPRESSION: No IUD is visualized. No sonographic evidence for ovarian torsion. No acute process. Electronically Signed   By: Annia Belt M.D.   On: 05/31/2016 15:26   [place] Art/ven Flow Abd Pelv Doppler  Result Date: 05/31/2016 CLINICAL DATA:  Patient with pelvic pain. IUD is not able to be visualized. EXAM: TRANSABDOMINAL AND TRANSVAGINAL ULTRASOUND OF PELVIS DOPPLER ULTRASOUND OF OVARIES TECHNIQUE: Both transabdominal and transvaginal ultrasound examinations of the pelvis were performed. Transabdominal technique was performed for global imaging of the pelvis including uterus, ovaries, adnexal regions, and pelvic cul-de-sac. It was necessary to proceed with endovaginal exam following the transabdominal exam to visualize the endometrium  and adnexal structures. Color and duplex Doppler ultrasound was utilized to evaluate blood flow to the ovaries. COMPARISON:  CT abdomen pelvis 05/24/2015 FINDINGS: Uterus Measurements: 8.4 x 4.6 x 6.2 cm. No fibroids or other mass visualized. Endometrium Thickness: 7 mm. No focal abnormality visualized. No IUD is present. Right ovary Measurements: 3.9 x 3.3 x 2.7 cm. Normal appearance/no adnexal mass. Left ovary Measurements: 3.3 x 1.5 x 1.9 cm. Normal appearance/no adnexal mass. Pulsed Doppler evaluation of both ovaries demonstrates normal low-resistance arterial and venous waveforms. Other findings No abnormal free fluid. IMPRESSION: No IUD is visualized. No sonographic evidence for ovarian torsion. No acute process. Electronically Signed   By: Annia Belt M.D.   On: 05/31/2016 15:26   MAU Course  Procedures Toradol 30 mg  Oxycodone 5 mg po Zofran 4 mg po  MDM Labs and [place] ordered and reviewed. No IUD seen on [place]. Abd xray ordered. Spoke with Radiologist Dr. Llana Aliment regarding xray impression (reported at not viewing inferior aspect of pelvis), he was able to review a second image not reported by original radiologist and confirms no IUD present. No evidence of acute abdominal or pelvic process. Pain etiology unclear. Pt improved after analgesics. Stable for discharge home.   Assessment and Plan   1. Lower abdominal pain   2. Pelvic pain   3. IUD strings lost   4. Intrauterine contraceptive device threads lost, initial encounter   5. Pregnancy test negative    Discharge home Follow up at [facility] in 1 week Back-up contraception OTC analgesics prn  Allergies as of 05/31/2016      Reactions   Darvocet [propoxyphene N-acetaminophen] Nausea And Vomiting, Other (See Comments)   Reaction:  Dizziness    Miconazole Nitrate Swelling, Rash      Medication List    STOP taking these medications   diclofenac 50 MG tablet Commonly known as:  CATAFLAM   LILETTA (52 MG) 18.6 MCG/DAY Iud IUD Generic  drug:  levonorgestrel   methylPREDNISolone 4 MG Tbpk tablet Commonly known as:  MEDROL DOSEPAK   oxyCODONE-acetaminophen 5-325 MG tablet Commonly known as:  PERCOCET/ROXICET     TAKE these medications   acetaminophen 500 MG tablet Commonly known as:  TYLENOL Take 500-1,000 mg by mouth 2 (two) times daily as needed for moderate pain.   cetirizine 10 MG tablet Commonly  known as:  ZYRTEC Take 10 mg by mouth daily as needed for allergies.   ECHINACEA C COMPLETE PO Take 1 tablet by mouth daily.   gabapentin 300 MG capsule Commonly known as:  NEURONTIN Take 1 capsule (300 mg total) by mouth 3 (three) times daily.   HYDROcodone-acetaminophen 7.5-325 MG tablet Commonly known as:  NORCO Take 1 tablet by mouth every 6 (six) hours as needed for moderate pain.   HYDROcodone-acetaminophen 10-325 MG tablet Commonly known as:  NORCO TAKE 1/2 TO 1 TABLET EVERY 8-12 HOURS AS NEEDED FOR PAIN   ibuprofen 800 MG tablet Commonly known as:  ADVIL,MOTRIN Take 1 tablet (800 mg total) by mouth 3 (three) times daily.   loratadine 10 MG tablet Commonly known as:  CLARITIN Take 10 mg by mouth daily.   meloxicam 15 MG tablet Commonly known as:  MOBIC Take 1 tablet (15 mg total) by mouth daily as needed for pain.   multivitamin tablet Take 1 tablet by mouth daily.      Donette Larry, CNM 05/31/2016, 1:20 PM

## 2016-05-31 NOTE — Discharge Instructions (Signed)

## 2016-06-01 LAB — GC/CHLAMYDIA PROBE AMP (~~LOC~~) NOT AT ARMC
Chlamydia: NEGATIVE
Neisseria Gonorrhea: NEGATIVE

## 2016-06-13 ENCOUNTER — Emergency Department (HOSPITAL_COMMUNITY)
Admission: EM | Admit: 2016-06-13 | Discharge: 2016-06-13 | Disposition: A | Discharge status: Home / Self Care | Payer: Medicaid Other | Attending: Emergency Medicine | Admitting: Emergency Medicine

## 2016-06-13 ENCOUNTER — Emergency Department (HOSPITAL_COMMUNITY): Payer: Medicaid Other

## 2016-06-13 ENCOUNTER — Encounter (HOSPITAL_COMMUNITY): Payer: Self-pay | Admitting: *Deleted

## 2016-06-13 DIAGNOSIS — R05 Cough: Secondary | ICD-10-CM | POA: Diagnosis present

## 2016-06-13 DIAGNOSIS — J111 Influenza due to unidentified influenza virus with other respiratory manifestations: Secondary | ICD-10-CM | POA: Diagnosis not present

## 2016-06-13 DIAGNOSIS — R69 Illness, unspecified: Secondary | ICD-10-CM

## 2016-06-13 DIAGNOSIS — F17210 Nicotine dependence, cigarettes, uncomplicated: Secondary | ICD-10-CM | POA: Insufficient documentation

## 2016-06-13 LAB — RAPID STREP SCREEN (MED CTR MEBANE ONLY): Streptococcus, Group A Screen (Direct): NEGATIVE

## 2016-06-13 LAB — CBC
HCT: 39.3 % (ref 36.0–46.0)
Hemoglobin: 13 g/dL (ref 12.0–15.0)
MCH: 31.5 pg (ref 26.0–34.0)
MCHC: 33.1 g/dL (ref 30.0–36.0)
MCV: 95.2 fL (ref 78.0–100.0)
Platelets: 261 10*3/uL (ref 150–400)
RBC: 4.13 MIL/uL (ref 3.87–5.11)
RDW: 13 % (ref 11.5–15.5)
WBC: 15.9 10*3/uL — ABNORMAL HIGH (ref 4.0–10.5)

## 2016-06-13 LAB — BASIC METABOLIC PANEL
Anion gap: 10 (ref 5–15)
BUN: 12 mg/dL (ref 6–20)
CO2: 25 mmol/L (ref 22–32)
Calcium: 9.5 mg/dL (ref 8.9–10.3)
Chloride: 100 mmol/L — ABNORMAL LOW (ref 101–111)
Creatinine, Ser: 0.74 mg/dL (ref 0.44–1.00)
GFR calc Af Amer: >60 mL/min (ref >60)
GFR calc non Af Amer: >60 mL/min (ref >60)
Glucose, Bld: 91 mg/dL (ref 65–99)
Potassium: 4.5 mmol/L (ref 3.5–5.1)
Sodium: 135 mmol/L (ref 135–145)

## 2016-06-13 MED ORDER — CETIRIZINE HCL 10 MG PO TABS: 10.0000 mg | ORAL_TABLET | Freq: Every day | ORAL | 1 refills | Status: DC

## 2016-06-13 MED ORDER — FLUTICASONE PROPIONATE 50 MCG/ACT NA SUSP: 2.0000 | Freq: Every day | NASAL | 0 refills | Status: DC

## 2016-06-13 MED ORDER — BENZONATATE 100 MG PO CAPS: 100.0000 mg | ORAL_CAPSULE | Freq: Three times a day (TID) | ORAL | 0 refills | Status: DC | PRN

## 2016-06-13 MED ORDER — IPRATROPIUM BROMIDE 0.02 % IN SOLN
0.5000 mg | Freq: Once | RESPIRATORY_TRACT | Status: AC
  Administered 2016-06-13: 0.5 mg via RESPIRATORY_TRACT
  Filled 2016-06-13: qty 2.5

## 2016-06-13 MED ORDER — ALBUTEROL SULFATE (2.5 MG/3ML) 0.083% IN NEBU
5.0000 mg | INHALATION_SOLUTION | Freq: Once | RESPIRATORY_TRACT | Status: AC
  Administered 2016-06-13: 5 mg via RESPIRATORY_TRACT
  Filled 2016-06-13: qty 6

## 2016-06-13 MED ORDER — ACETAMINOPHEN 325 MG PO TABS
650.0000 mg | ORAL_TABLET | Freq: Once | ORAL | Status: AC
  Administered 2016-06-13: 650 mg via ORAL
  Filled 2016-06-13: qty 2

## 2016-06-13 MED ORDER — NAPROXEN 250 MG PO TABS: 250.0000 mg | ORAL_TABLET | Freq: Two times a day (BID) | ORAL | 0 refills | Status: DC

## 2016-06-13 MED ORDER — ALBUTEROL SULFATE HFA 108 (90 BASE) MCG/ACT IN AERS
2.0000 | INHALATION_SPRAY | Freq: Once | RESPIRATORY_TRACT | Status: AC
  Administered 2016-06-13: 2 via RESPIRATORY_TRACT
  Filled 2016-06-13: qty 6.7

## 2016-06-13 MED ORDER — OSELTAMIVIR PHOSPHATE 75 MG PO CAPS: 75.0000 mg | ORAL_CAPSULE | Freq: Two times a day (BID) | ORAL | 0 refills | Status: DC

## 2016-06-13 MED ORDER — AEROCHAMBER PLUS FLO-VU LARGE MISC: 1.0000 | Freq: Once | Status: DC

## 2016-06-13 NOTE — ED Triage Notes (Signed)
The pt was ill all last week today she has had body aches sob  Sore throat and she has had chest congestion no known temp  lmp now.. Cold cough

## 2016-06-13 NOTE — ED Provider Notes (Signed)
[facility]-EMERGENCY DEPT Provider Note   CSN: [id number] Arrival date & time: 06/13/16  4098     History   Chief Complaint Chief Complaint  Patient presents with  . Generalized Body Aches  . Chest Pain    HPI Marissa Smith is a 31 y.o. female.  Marissa Smith is a 31 y.o. Female who presents to the ED complaining of body aches, cough, and sore throat since yesterday. Patient reports yesterday she began having body aches, nasal congestion, sore throat, wheezing, shortness of breath, and chest pain with coughing. She reports her body aches "from the tip of my toes to my hair follicles." She reports taking NyQuil last night around 8:30 PM with little relief. She reports last week she had flulike symptoms that improved and then symptoms returned again yesterday. She did receive her flu shot this year. She reports subjective fevers. She is a smoker. She denies abdominal pain, nausea, vomiting, diarrhea, trouble swallowing, neck pain, lightheadedness, syncope, urinary symptoms or rashes.   The history is provided by the patient and medical records. No language interpreter was used.  Chest Pain   Associated symptoms include a fever (subjective ). Pertinent negatives include no abdominal pain, no back pain, no cough, no headaches, no nausea, no palpitations, no shortness of breath and no vomiting.    Past Medical History:  Diagnosis Date  . Back pain, chronic   . Cat allergies   . Depressed bipolar I disorder (HCC)   . Seasonal allergies     Patient Active Problem List   Diagnosis Date Noted  . Encounter for IUD insertion 08/14/2015  . Dysmenorrhea 10/31/2012  . Abnormal uterine bleeding (AUB) 10/31/2012    Past Surgical History:  Procedure Laterality Date  . CHOLECYSTECTOMY  2010   lap chole    OB History    Gravida Para Term Preterm AB Living   4 0 0 0 1 3   SAB TAB Ectopic Multiple Live Births   0 0 0 0 3       Home Medications    Prior to Admission medications     Medication Sig Start Date End Date Taking? Authorizing Provider  Multiple Vitamin (MULTIVITAMIN) tablet Take 1 tablet by mouth daily.   Yes Historical Provider, MD  Specialty Vitamins Products (ECHINACEA C COMPLETE PO) Take 1 tablet by mouth daily.    Yes Historical Provider, MD  Tetrahydrozoline HCl (EYE DROPS OP) Place 1 drop into both eyes as needed (for dry eyes).   Yes Historical Provider, MD  acetaminophen (TYLENOL) 500 MG tablet Take 500-1,000 mg by mouth 2 (two) times daily as needed for moderate pain.    Historical Provider, MD  benzonatate (TESSALON) 100 MG capsule Take 1 capsule (100 mg total) by mouth 3 (three) times daily as needed. 06/13/16   Everlene Farrier, PA-C  cetirizine (ZYRTEC ALLERGY) 10 MG tablet Take 1 tablet (10 mg total) by mouth daily. 06/13/16   Everlene Farrier, PA-C  fluticasone (FLONASE) 50 MCG/ACT nasal spray Place 2 sprays into both nostrils daily. 06/13/16   Everlene Farrier, PA-C  gabapentin (NEURONTIN) 300 MG capsule Take 1 capsule (300 mg total) by mouth 3 (three) times daily. Patient not taking: Reported on 06/13/2016 02/22/16 02/27/16  Liberty Handy, PA-C  naproxen (NAPROSYN) 250 MG tablet Take 1 tablet (250 mg total) by mouth 2 (two) times daily with a meal. 06/13/16   Everlene Farrier, PA-C  oseltamivir (TAMIFLU) 75 MG capsule Take 1 capsule (75 mg total) by mouth every  12 (twelve) hours. 06/13/16   Everlene Farrier, PA-C    Family History Family History  Problem Relation Age of Onset  . Heart failure Father     Social History Social History  Substance Use Topics  . Smoking status: Light Tobacco Smoker    Packs/day: 0.25    Types: Cigarettes  . Smokeless tobacco: Never Used  . Alcohol use No     Allergies   Darvocet [propoxyphene n-acetaminophen] and Miconazole nitrate   Review of Systems Review of Systems  Constitutional: Positive for chills, fatigue and fever (subjective ).  HENT: Positive for congestion, rhinorrhea, sneezing and sore throat. Negative  for ear discharge, ear pain, facial swelling, mouth sores and trouble swallowing.   Eyes: Negative for visual disturbance.  Respiratory: Negative for cough, shortness of breath and wheezing.   Cardiovascular: Positive for chest pain (with coughing. ). Negative for palpitations.  Gastrointestinal: Negative for abdominal pain, diarrhea, nausea and vomiting.  Genitourinary: Negative for difficulty urinating and dysuria.  Musculoskeletal: Positive for myalgias. Negative for back pain and neck pain.  Skin: Negative for rash.  Neurological: Negative for syncope, light-headedness and headaches.     Physical Exam Updated Vital Signs BP 101/58   Pulse 73   Temp 98.4 F (36.9 C)   Resp 17   Ht 5\' 5"  (1.651 m)   Wt 78.6 kg   LMP 06/13/2016   SpO2 99%   BMI 28.83 kg/m   Physical Exam  Constitutional: She is oriented to person, place, and time. She appears well-developed and well-nourished. No distress.  Nontoxic appearing.  HENT:  Head: Normocephalic and atraumatic.  Right Ear: External ear normal.  Left Ear: External ear normal.  Mouth/Throat: Oropharynx is clear and moist. No oropharyngeal exudate.  Clear middle ear effusion noted bilaterally. No TM erythema or loss of landmarks. Boggy nasal turbinates and rhinorrhea present. Mild bilateral tonsillar hypertrophy without exudates. Uvula is midline without edema. No trismus. No peritonsillar abscess. No drooling.  Eyes: Conjunctivae are normal. Pupils are equal, round, and reactive to light. Right eye exhibits no discharge. Left eye exhibits no discharge.  Neck: Normal range of motion. Neck supple.  Cardiovascular: Normal rate, regular rhythm, normal heart sounds and intact distal pulses.  Exam reveals no gallop and no friction rub.   No murmur heard. Pulmonary/Chest: Effort normal. No stridor. No respiratory distress. She has wheezes. She has no rales. She exhibits tenderness.  Slight scattered wheezes noted bilaterally. No increased  work of breathing. No rales or rhonchi. Chest wall is tender to palpation reproduces her chest pain with coughing.  Abdominal: Soft. There is no tenderness. There is no guarding.  Musculoskeletal: She exhibits no edema.  Lymphadenopathy:    She has no cervical adenopathy.  Neurological: She is alert and oriented to person, place, and time. Coordination normal.  Skin: Skin is warm and dry. Capillary refill takes less than 2 seconds. No rash noted. She is not diaphoretic. No erythema. No pallor.  Psychiatric: She has a normal mood and affect. Her behavior is normal.  Nursing note and vitals reviewed.    ED Treatments / Results  Labs (all labs ordered are listed, but only abnormal results are displayed) Labs Reviewed  BASIC METABOLIC PANEL - Abnormal; Notable for the following:       Result Value   Chloride 100 (*)    All other components within normal limits  CBC - Abnormal; Notable for the following:    WBC 15.9 (*)    All other components  within normal limits  RAPID STREP SCREEN (NOT AT [facility])  CULTURE, GROUP A STREP [employer])    EKG  EKG Interpretation  Date/Time:  Saturday June 13 2016 05:52:04 EST Ventricular Rate:  88 PR Interval:  108 QRS Duration: 80 QT Interval:  354 QTC Calculation: 428 R Axis:   66 Text Interpretation:  Sinus rhythm with short PR Otherwise normal ECG Confirmed by KNOTT MD, DANIEL [phone]([id number]) on 06/13/2016 7:24:11 AM       Radiology Dg Chest 2 View  Result Date: 06/13/2016 CLINICAL DATA:  Shortness of breath, cough. EXAM: CHEST  2 VIEW COMPARISON:  Radiographs of November 27, 2015. FINDINGS: The heart size and mediastinal contours are within normal limits. Both lungs are clear. No pneumothorax or pleural effusion is noted. The visualized skeletal structures are unremarkable. IMPRESSION: No active cardiopulmonary disease. Electronically Signed   By: Lupita RaiderJames  Green Jr, M.D.   On: 06/13/2016 07:35    Procedures Procedures (including critical care  time)  Medications Ordered in ED Medications  albuterol (PROVENTIL HFA;VENTOLIN HFA) 108 (90 Base) MCG/ACT inhaler 2 puff (not administered)  AEROCHAMBER PLUS FLO-VU LARGE MISC 1 each (not administered)  albuterol (PROVENTIL) (2.5 MG/3ML) 0.083% nebulizer solution 5 mg (5 mg Nebulization Given 06/13/16 0641)  ipratropium (ATROVENT) nebulizer solution 0.5 mg (0.5 mg Nebulization Given 06/13/16 0641)  acetaminophen (TYLENOL) tablet 650 mg (650 mg Oral Given 06/13/16 0641)     Initial Impression / Assessment and Plan / ED Course  I have reviewed the triage vital signs and the nursing notes.  Pertinent labs & imaging results that were available during my care of the patient were reviewed by me and considered in my medical decision making (see chart for details).    This  is a 31 y.o. Female who presents to the ED complaining of body aches, cough, and sore throat since yesterday. Patient reports yesterday she began having body aches, nasal congestion, sore throat, wheezing, shortness of breath, and chest pain with coughing. She reports her body aches "from the tip of my toes to my hair follicles." She reports taking NyQuil last night around 8:30 PM with little relief. She reports last week she had flulike symptoms that improved and then symptoms returned again yesterday. On exam the patient is afebrile nontoxic appearing. She is rhinorrhea and boggy nasal turbinates bilaterally. She has slight scattered wheezes noted bilaterally. No increased work of breathing. No rales or rhonchi. Strep test is negative. BMP is unremarkable. CBC is remarkable for white count of 15,900. Chest x-ray is unremarkable. Patient received albuterol treatment and reevaluation reports her wheezing, shortness of breath and chest tightness has resolved. She reports this made her feel better. Patient with flulike symptoms beginning yesterday. Will start on Tamiflu. I discussed the expected course and treatment of flu. I discussed the  side effects of Tamiflu. Will also discharge with albuterol inhaler and prescriptions for Flonase, Zyrtec, Tessalon Perles and naproxen. I advised the patient to follow-up with their primary care provider this week. I advised the patient to return to the emergency department with new or worsening symptoms or new concerns. The patient verbalized understanding and agreement with plan.      Final Clinical Impressions(s) / ED Diagnoses   Final diagnoses:  Influenza-like illness    New Prescriptions New Prescriptions   BENZONATATE (TESSALON) 100 MG CAPSULE    Take 1 capsule (100 mg total) by mouth 3 (three) times daily as needed.   CETIRIZINE (ZYRTEC ALLERGY) 10 MG TABLET    Take  1 tablet (10 mg total) by mouth daily.   FLUTICASONE (FLONASE) 50 MCG/ACT NASAL SPRAY    Place 2 sprays into both nostrils daily.   NAPROXEN (NAPROSYN) 250 MG TABLET    Take 1 tablet (250 mg total) by mouth 2 (two) times daily with a meal.   OSELTAMIVIR (TAMIFLU) 75 MG CAPSULE    Take 1 capsule (75 mg total) by mouth every 12 (twelve) hours.     Everlene Farrier, PA-C 06/13/16 [phone]

## 2016-06-13 NOTE — ED Notes (Signed)
Patient transported to X-ray 

## 2016-06-15 LAB — CULTURE, GROUP A STREP (THRC)

## 2016-07-29 NOTE — ED Provider Notes (Signed)
Medical screening examination/treatment/procedure(s) were performed by non-physician practitioner and as supervising physician I was immediately available for consultation/collaboration.   EKG Interpretation  Date/Time:  Saturday June 13 2016 05:52:04 EST Ventricular Rate:  88 PR Interval:  108 QRS Duration: 80 QT Interval:  354 QTC Calculation: 428 R Axis:   66 Text Interpretation:  Sinus rhythm with short PR Otherwise normal ECG No significant change since last tracing Confirmed by LITTLE MD, RACHEL [phone]) on 06/14/2016 1:17:49 PM         Dione Booze, MD 07/29/16 1217

## 2016-09-04 ENCOUNTER — Encounter (HOSPITAL_COMMUNITY): Payer: Self-pay | Admitting: *Deleted

## 2016-09-04 ENCOUNTER — Ambulatory Visit (HOSPITAL_COMMUNITY)
Admission: EM | Admit: 2016-09-04 | Discharge: 2016-09-04 | Disposition: A | Discharge status: Home / Self Care | Payer: Medicaid Other | Attending: Family Medicine | Admitting: Family Medicine

## 2016-09-04 DIAGNOSIS — M25512 Pain in left shoulder: Secondary | ICD-10-CM | POA: Diagnosis not present

## 2016-09-04 DIAGNOSIS — M7582 Other shoulder lesions, left shoulder: Secondary | ICD-10-CM

## 2016-09-04 MED ORDER — CYCLOBENZAPRINE HCL 10 MG PO TABS: 10.0000 mg | ORAL_TABLET | Freq: Two times a day (BID) | ORAL | 0 refills | Status: DC | PRN

## 2016-09-04 MED ORDER — IBUPROFEN 800 MG PO TABS: 800.0000 mg | ORAL_TABLET | Freq: Three times a day (TID) | ORAL | 0 refills | Status: DC

## 2016-09-04 NOTE — ED Triage Notes (Signed)
Pt  States   Was  Lifting  Tiles  X  3  Weeks  Ago  Pt  Has  Pain    l  Shoulder  Pain   Worse  On  Certain  Positions

## 2016-09-04 NOTE — ED Provider Notes (Signed)
CSN: [id number]     Arrival date & time 09/04/16  1238 History   First MD Initiated Contact with Patient 09/04/16 1324     Chief Complaint  Patient presents with  . Shoulder Pain   (Consider location/radiation/quality/duration/timing/severity/associated sxs/prior Treatment) Patient c/o left shoulder pain after she was lifting tiles at work in repetitive fashion and developed left shoulder pain for 3 weeks.  She has been taking mobic.   The history is provided by the patient.  Shoulder Pain  Location:  Shoulder Shoulder location:  L shoulder Injury: no   Pain details:    Quality:  Aching   Radiates to:  Does not radiate   Severity:  Moderate   Onset quality:  Gradual   Duration:  3 weeks   Timing:  Constant Handedness:  Right-handed Dislocation: no   Foreign body present:  No foreign bodies Tetanus status:  Out of date Relieved by:  Nothing Worsened by:  Nothing   Past Medical History:  Diagnosis Date  . Back pain, chronic   . Cat allergies   . Depressed bipolar I disorder (HCC)   . Seasonal allergies    Past Surgical History:  Procedure Laterality Date  . CHOLECYSTECTOMY  2010   lap chole   Family History  Problem Relation Age of Onset  . Heart failure Father    Social History  Substance Use Topics  . Smoking status: Light Tobacco Smoker    Packs/day: 0.25    Types: Cigarettes  . Smokeless tobacco: Never Used  . Alcohol use No   OB History    Gravida Para Term Preterm AB Living   4 0 0 0 1 3   SAB TAB Ectopic Multiple Live Births   0 0 0 0 3     Review of Systems  Constitutional: Negative.   HENT: Negative.   Eyes: Negative.   Respiratory: Negative.   Cardiovascular: Negative.   Gastrointestinal: Negative.   Endocrine: Negative.   Genitourinary: Negative.   Musculoskeletal: Positive for arthralgias.  Skin: Negative.   Allergic/Immunologic: Negative.   Neurological: Negative.   Hematological: Negative.   Psychiatric/Behavioral: Negative.      Allergies  Darvocet [propoxyphene n-acetaminophen] and Miconazole nitrate  Home Medications   Prior to Admission medications   Medication Sig Start Date End Date Taking? Authorizing Provider  acetaminophen (TYLENOL) 500 MG tablet Take 500-1,000 mg by mouth 2 (two) times daily as needed for moderate pain.    [provider]  benzonatate (TESSALON) 100 MG capsule Take 1 capsule (100 mg total) by mouth 3 (three) times daily as needed. 06/13/16   Everlene Farrieransie, Maximus Hoffert, PA-C  cetirizine (ZYRTEC ALLERGY) 10 MG tablet Take 1 tablet (10 mg total) by mouth daily. 06/13/16   Everlene Farrieransie, Edenilson Austad, PA-C  cyclobenzaprine (FLEXERIL) 10 MG tablet Take 1 tablet (10 mg total) by mouth 2 (two) times daily as needed for muscle spasms. 09/04/16   Deatra Canterxford, Chereese Cilento J, FNP  fluticasone (FLONASE) 50 MCG/ACT nasal spray Place 2 sprays into both nostrils daily. 06/13/16   Everlene Farrieransie, Novalyn Lajara, PA-C  gabapentin (NEURONTIN) 300 MG capsule Take 1 capsule (300 mg total) by mouth 3 (three) times daily. Patient not taking: Reported on 06/13/2016 02/22/16 02/27/16  Liberty HandyGibbons, Claudia J, PA-C  ibuprofen (ADVIL,MOTRIN) 800 MG tablet Take 1 tablet (800 mg total) by mouth 3 (three) times daily. 09/04/16   Deatra Canterxford, Saga Balthazar J, FNP  Multiple Vitamin (MULTIVITAMIN) tablet Take 1 tablet by mouth daily.    [provider]  naproxen (NAPROSYN) 250 MG  tablet Take 1 tablet (250 mg total) by mouth 2 (two) times daily with a meal. 06/13/16   Everlene Farrier, PA-C  oseltamivir (TAMIFLU) 75 MG capsule Take 1 capsule (75 mg total) by mouth every 12 (twelve) hours. 06/13/16   Everlene Farrier, PA-C  Specialty Vitamins Products (ECHINACEA C COMPLETE PO) Take 1 tablet by mouth daily.     [provider]  Tetrahydrozoline HCl (EYE DROPS OP) Place 1 drop into both eyes as needed (for dry eyes).    [provider]   Meds Ordered and Administered this Visit  Medications - No data to display  BP 127/86 (BP Location: Left Arm)   Pulse 69    Temp 98.4 F (36.9 C) (Oral)   Resp 16   LMP 09/02/2016   SpO2 100%  No data found.   Physical Exam  Constitutional: She is oriented to person, place, and time. She appears well-developed and well-nourished.  HENT:  Head: Normocephalic and atraumatic.  Eyes: Conjunctivae and EOM are normal. Pupils are equal, round, and reactive to light.  Neck: Normal range of motion. Neck supple.  Cardiovascular: Normal rate, regular rhythm and normal heart sounds.   Pulmonary/Chest: Effort normal and breath sounds normal.  Abdominal: Soft. Bowel sounds are normal.  Musculoskeletal: She exhibits tenderness.  Left shoulder with decreased ROM and tenderness with external and internal rotation and abduction  Neurological: She is alert and oriented to person, place, and time.  Nursing note and vitals reviewed.   Urgent Care Course     Procedures (including critical care time)  Labs Review Labs Reviewed - No data to display  Imaging Review No results found.   Visual Acuity Review  Right Eye Distance:   Left Eye Distance:   Bilateral Distance:    Right Eye Near:   Left Eye Near:    Bilateral Near:         MDM   1. Acute pain of left shoulder   2. Rotator cuff tendonitis, left    Cyclobenzaprine 10 mg one po bid prn #20 Motrin 800mg  one po tid x 10 days      Deatra Canter, FNP 09/04/16 1350

## 2016-10-06 ENCOUNTER — Ambulatory Visit: Payer: Medicaid Other | Admitting: Obstetrics

## 2016-10-10 ENCOUNTER — Encounter (HOSPITAL_COMMUNITY): Payer: Self-pay | Admitting: Emergency Medicine

## 2016-10-10 ENCOUNTER — Emergency Department (HOSPITAL_COMMUNITY)
Admission: EM | Admit: 2016-10-10 | Discharge: 2016-10-10 | Disposition: A | Discharge status: Home / Self Care | Payer: Medicaid Other | Attending: Emergency Medicine | Admitting: Emergency Medicine

## 2016-10-10 DIAGNOSIS — R112 Nausea with vomiting, unspecified: Secondary | ICD-10-CM

## 2016-10-10 DIAGNOSIS — R42 Dizziness and giddiness: Secondary | ICD-10-CM | POA: Diagnosis not present

## 2016-10-10 DIAGNOSIS — Z79899 Other long term (current) drug therapy: Secondary | ICD-10-CM | POA: Diagnosis not present

## 2016-10-10 DIAGNOSIS — R0981 Nasal congestion: Secondary | ICD-10-CM | POA: Diagnosis not present

## 2016-10-10 DIAGNOSIS — R079 Chest pain, unspecified: Secondary | ICD-10-CM | POA: Diagnosis not present

## 2016-10-10 DIAGNOSIS — R002 Palpitations: Secondary | ICD-10-CM | POA: Diagnosis not present

## 2016-10-10 DIAGNOSIS — F17210 Nicotine dependence, cigarettes, uncomplicated: Secondary | ICD-10-CM | POA: Insufficient documentation

## 2016-10-10 DIAGNOSIS — R197 Diarrhea, unspecified: Secondary | ICD-10-CM | POA: Insufficient documentation

## 2016-10-10 LAB — CBC WITH DIFFERENTIAL/PLATELET
Basophils Absolute: 0 10*3/uL (ref 0.0–0.1)
Basophils Relative: 0 %
Eosinophils Absolute: 0.1 10*3/uL (ref 0.0–0.7)
Eosinophils Relative: 1 %
HCT: 38 % (ref 36.0–46.0)
Hemoglobin: 13.3 g/dL (ref 12.0–15.0)
Lymphocytes Relative: 9 %
Lymphs Abs: 1 10*3/uL (ref 0.7–4.0)
MCH: 32 pg (ref 26.0–34.0)
MCHC: 35 g/dL (ref 30.0–36.0)
MCV: 91.3 fL (ref 78.0–100.0)
Monocytes Absolute: 1.1 10*3/uL — ABNORMAL HIGH (ref 0.1–1.0)
Monocytes Relative: 10 %
Neutro Abs: 8.6 10*3/uL — ABNORMAL HIGH (ref 1.7–7.7)
Neutrophils Relative %: 80 %
Platelets: 234 10*3/uL (ref 150–400)
RBC: 4.16 MIL/uL (ref 3.87–5.11)
RDW: 13.1 % (ref 11.5–15.5)
WBC: 10.8 10*3/uL — ABNORMAL HIGH (ref 4.0–10.5)

## 2016-10-10 LAB — URINALYSIS, ROUTINE W REFLEX MICROSCOPIC
Bacteria, UA: NONE SEEN
Bilirubin Urine: NEGATIVE
Glucose, UA: NEGATIVE mg/dL
Hgb urine dipstick: NEGATIVE
Ketones, ur: NEGATIVE mg/dL
Leukocytes, UA: NEGATIVE
Nitrite: NEGATIVE
Protein, ur: 30 mg/dL — AB
Specific Gravity, Urine: 1.025 (ref 1.005–1.030)
pH: 6 (ref 5.0–8.0)

## 2016-10-10 LAB — BASIC METABOLIC PANEL
Anion gap: 8 (ref 5–15)
BUN: 16 mg/dL (ref 6–20)
CO2: 25 mmol/L (ref 22–32)
Calcium: 9 mg/dL (ref 8.9–10.3)
Chloride: 104 mmol/L (ref 101–111)
Creatinine, Ser: 0.66 mg/dL (ref 0.44–1.00)
GFR calc Af Amer: >60 mL/min (ref >60)
GFR calc non Af Amer: >60 mL/min (ref >60)
Glucose, Bld: 111 mg/dL — ABNORMAL HIGH (ref 65–99)
Potassium: 3.9 mmol/L (ref 3.5–5.1)
Sodium: 137 mmol/L (ref 135–145)

## 2016-10-10 LAB — RAPID URINE DRUG SCREEN, HOSP PERFORMED
Amphetamines: NOT DETECTED
Barbiturates: NOT DETECTED
Benzodiazepines: NOT DETECTED
Cocaine: NOT DETECTED
Opiates: POSITIVE — AB
Tetrahydrocannabinol: NOT DETECTED

## 2016-10-10 LAB — PREGNANCY, URINE: Preg Test, Ur: NEGATIVE

## 2016-10-10 LAB — TROPONIN I: Troponin I: <0.03 ng/mL (ref <0.03)

## 2016-10-10 MED ORDER — DIAZEPAM 5 MG/ML IJ SOLN
2.5000 mg | Freq: Once | INTRAMUSCULAR | Status: AC
  Administered 2016-10-10: 2.5 mg via INTRAVENOUS
  Filled 2016-10-10: qty 2

## 2016-10-10 MED ORDER — ONDANSETRON HCL 4 MG/2ML IJ SOLN
4.0000 mg | Freq: Once | INTRAMUSCULAR | Status: AC
  Administered 2016-10-10: 4 mg via INTRAVENOUS
  Filled 2016-10-10: qty 2

## 2016-10-10 MED ORDER — ONDANSETRON 4 MG PO TBDP
4.0000 mg | ORAL_TABLET | Freq: Three times a day (TID) | ORAL | 0 refills | Status: DC | PRN
Start: 2016-10-10 — End: 2016-12-09

## 2016-10-10 MED ORDER — MECLIZINE HCL 25 MG PO TABS: 25.0000 mg | ORAL_TABLET | Freq: Three times a day (TID) | ORAL | 0 refills | Status: DC | PRN

## 2016-10-10 MED ORDER — SODIUM CHLORIDE 0.9 % IV BOLUS (SEPSIS)
1000.0000 mL | Freq: Once | INTRAVENOUS | Status: AC
  Administered 2016-10-10: 1000 mL via INTRAVENOUS

## 2016-10-10 NOTE — ED Provider Notes (Signed)
[facility]-EMERGENCY DEPT Provider Note   CSN: [id number] Arrival date & time: 10/10/16  1305     History   Chief Complaint Chief Complaint  Patient presents with  . Dizziness  . Emesis    HPI Marissa Smith is a 31 y.o. female.  HPI Marissa Smith is a 31 y.o. female presents to emergency department complaining of dizziness, nausea, vomiting, palpitations. Patient states her symptoms started suddenly when she was at home today. She states she suddenly started feeling dizzy, started feeling lightheaded and he was spinning around her, she felt like her heart was racing, she became nauseated and vomited several times. She reports some chest pain. Denies abdominal pain. She states she has had some mild diarrhea over last several days. She denies any fever or chills patient states she has had some nasal congestion. She states she took Sudafed prior to her symptoms starting. She states she has had surgery before but never had similar symptoms. She denies any ear pain or ear ringing. She denies headache. No falls or injuries. No syncopal episode  Past Medical History:  Diagnosis Date  . Back pain, chronic   . Cat allergies   . Depressed bipolar I disorder (HCC)   . Seasonal allergies     Patient Active Problem List   Diagnosis Date Noted  . Encounter for IUD insertion 08/14/2015  . Dysmenorrhea 10/31/2012  . Abnormal uterine bleeding (AUB) 10/31/2012    Past Surgical History:  Procedure Laterality Date  . CHOLECYSTECTOMY  2010   lap chole    OB History    Gravida Para Term Preterm AB Living   4 0 0 0 1 3   SAB TAB Ectopic Multiple Live Births   0 0 0 0 3       Home Medications    Prior to Admission medications   Medication Sig Start Date End Date Taking? Authorizing Provider  cetirizine (ZYRTEC ALLERGY) 10 MG tablet Take 1 tablet (10 mg total) by mouth daily. 06/13/16  Yes Everlene Farrier, PA-C  fluticasone (FLONASE) 50 MCG/ACT nasal spray Place 2 sprays into both nostrils  daily. 06/13/16  Yes Everlene Farrier, PA-C  ibuprofen (ADVIL,MOTRIN) 800 MG tablet Take 1 tablet (800 mg total) by mouth 3 (three) times daily. Patient taking differently: Take 800 mg by mouth every 8 (eight) hours as needed for moderate pain.  09/04/16  Yes Deatra Canter, FNP  methocarbamol (ROBAXIN) 750 MG tablet Take 750 mg by mouth every 8 (eight) hours as needed for muscle spasms.   Yes [provider]  Multiple Vitamin (MULTIVITAMIN) tablet Take 1 tablet by mouth daily.   Yes [provider]  Oxycodone HCl 10 MG TABS Take 10 mg by mouth daily as needed (pain).   Yes [provider]  Tetrahydrozoline HCl (EYE DROPS OP) Place 1 drop into both eyes as needed (for dry eyes).   Yes [provider]  zolpidem (AMBIEN) 10 MG tablet Take 10 mg by mouth at bedtime as needed for sleep.  09/23/16  Yes [provider]  acetaminophen (TYLENOL) 500 MG tablet Take 500-1,000 mg by mouth 2 (two) times daily as needed for moderate pain.    [provider]  benzonatate (TESSALON) 100 MG capsule Take 1 capsule (100 mg total) by mouth 3 (three) times daily as needed. Patient not taking: Reported on 10/10/2016 06/13/16   Everlene Farrier, PA-C  cyclobenzaprine (FLEXERIL) 10 MG tablet Take 1 tablet (10 mg total) by mouth 2 (two) times daily  as needed for muscle spasms. 09/04/16   Deatra Canterxford, William J, FNP  gabapentin (NEURONTIN) 300 MG capsule Take 1 capsule (300 mg total) by mouth 3 (three) times daily. Patient not taking: Reported on 06/13/2016 02/22/16 02/27/16  Liberty HandyGibbons, Claudia J, PA-C  naproxen (NAPROSYN) 250 MG tablet Take 1 tablet (250 mg total) by mouth 2 (two) times daily with a meal. Patient not taking: Reported on 10/10/2016 06/13/16   Everlene Farrieransie, William, PA-C  oseltamivir (TAMIFLU) 75 MG capsule Take 1 capsule (75 mg total) by mouth every 12 (twelve) hours. Patient not taking: Reported on 10/10/2016 06/13/16   Everlene Farrieransie, William, PA-C    Family History Family History    Problem Relation Age of Onset  . Heart failure Father     Social History Social History  Substance Use Topics  . Smoking status: Light Tobacco Smoker    Packs/day: 0.25    Types: Cigarettes  . Smokeless tobacco: Never Used  . Alcohol use No     Allergies   Darvocet [propoxyphene n-acetaminophen] and Miconazole nitrate   Review of Systems Review of Systems  Constitutional: Negative for chills and fever.  Respiratory: Negative for cough, chest tightness and shortness of breath.   Cardiovascular: Negative for chest pain, palpitations and leg swelling.  Gastrointestinal: Positive for diarrhea, nausea and vomiting. Negative for abdominal pain.  Genitourinary: Negative for dysuria, flank pain, pelvic pain, vaginal bleeding, vaginal discharge and vaginal pain.  Musculoskeletal: Negative for arthralgias, myalgias, neck pain and neck stiffness.  Skin: Negative for rash.  Neurological: Positive for dizziness and light-headedness. Negative for weakness and headaches.  All other systems reviewed and are negative.    Physical Exam Updated Vital Signs Pulse 87   Temp 97.6 F (36.4 C)   Resp 20   Ht 5\' 5"  (1.651 m)   Wt 79.4 kg (175 lb)   SpO2 98%   BMI 29.12 kg/m   Physical Exam  Constitutional: She is oriented to person, place, and time. She appears well-developed and well-nourished. No distress.  HENT:  Head: Normocephalic.  Eyes: Conjunctivae and EOM are normal. Pupils are equal, round, and reactive to light.  No nystagmus  Neck: Neck supple.  Cardiovascular: Normal rate, regular rhythm and normal heart sounds.   Pulmonary/Chest: Effort normal and breath sounds normal. No respiratory distress. She has no wheezes. She has no rales.  Abdominal: Soft. Bowel sounds are normal. She exhibits no distension. There is no tenderness. There is no rebound.  Musculoskeletal: She exhibits no edema.  Neurological: She is alert and oriented to person, place, and time. No cranial nerve  deficit. Coordination normal.  Skin: Skin is warm and dry.  Psychiatric: She has a normal mood and affect. Her behavior is normal.  Nursing note and vitals reviewed.    ED Treatments / Results  Labs (all labs ordered are listed, but only abnormal results are displayed) Labs Reviewed  CBC WITH DIFFERENTIAL/PLATELET - Abnormal; Notable for the following:       Result Value   WBC 10.8 (*)    Neutro Abs 8.6 (*)    Monocytes Absolute 1.1 (*)    All other components within normal limits  BASIC METABOLIC PANEL - Abnormal; Notable for the following:    Glucose, Bld 111 (*)    All other components within normal limits  URINALYSIS, ROUTINE W REFLEX MICROSCOPIC - Abnormal; Notable for the following:    APPearance HAZY (*)    Protein, ur 30 (*)    Squamous Epithelial / LPF 0-5 (*)  All other components within normal limits  RAPID URINE DRUG SCREEN, HOSP PERFORMED - Abnormal; Notable for the following:    Opiates POSITIVE (*)    All other components within normal limits  TROPONIN I  PREGNANCY, URINE  I-STAT TROPOININ, ED  I-STAT BETA HCG BLOOD, ED ([facility], [facility], [facility] ONLY)    EKG  EKG Interpretation  Date/Time:  Saturday October 10 2016 13:19:15 EDT Ventricular Rate:  80 PR Interval:    QRS Duration: 96 QT Interval:  405 QTC Calculation: 468 R Axis:   62 Text Interpretation:  Sinus rhythm Short PR interval No significant change since last tracing Confirmed by Shaune Pollack [phone]) on 10/10/2016 3:55:57 PM       Radiology No results found.  Procedures Procedures (including critical care time)  Medications Ordered in ED Medications  sodium chloride 0.9 % bolus 1,000 mL (1,000 mLs Intravenous New Bag/Given 10/10/16 1456)  ondansetron (ZOFRAN) injection 4 mg (4 mg Intravenous Given 10/10/16 1456)  diazepam (VALIUM) injection 2.5 mg (2.5 mg Intravenous Given 10/10/16 1456)     Initial Impression / Assessment and Plan / ED Course  I have reviewed the triage vital signs and the  nursing notes.  Pertinent labs & imaging results that were available during my care of the patient were reviewed by me and considered in my medical decision making (see chart for details).     Patient with nausea, vomiting, diarrhea, acute onset of dizziness started this afternoon. Exam is unremarkable. Symptoms concerning for vertigo versus anxiety will get labs, EKG, started fluids, Valium and Zofran ordered.   4:05 PM Patient is feeling much better. She keeps bending her arm, she has not received any fluids yet, her IVs and left AC. I instructed her to keep her arm straight. Patient asked for something to eat and drink. I brought her some crackers and ginger ale. Discussed results and troponin and hCG still pending. Patient signed out at shift change, follow up on these results, finish fluid bolus, by mouth trial. If all normal, home with meclizine and follow up with PCP.  Pt signed out to PA Mia McDonald   Final Clinical Impressions(s) / ED Diagnoses   Final diagnoses:  Dizziness  Palpitation  Non-intractable vomiting with nausea, unspecified vomiting type    New Prescriptions New Prescriptions   No medications on file     Jaynie Crumble, Cordelia Poche 10/10/16 1607    Mesner, Barbara Cower, MD 10/11/16 1605

## 2016-10-10 NOTE — Discharge Instructions (Signed)
Please call your primary care provider and schedule a follow-up visit from today's emergency department evaluation. If you develop new or worsening symptoms including feeling like her heart is beating on her chest, fever, chills, please return to the emergency department for re-evaluation.

## 2016-10-10 NOTE — ED Triage Notes (Signed)
Pt states that she did take an oxycodone this morning as well.

## 2016-10-10 NOTE — ED Notes (Signed)
Patient states she has already  given a urine sample

## 2016-10-10 NOTE — ED Provider Notes (Signed)
  History per PA Kirichenko's chart:  "Marissa Smith is a 31 y.o. female presents to emergency department complaining of dizziness, nausea, vomiting, palpitations. Patient states her symptoms started suddenly when she was at home today. She states she suddenly started feeling dizzy, started feeling lightheaded and he was spinning around her, she felt like her heart was racing, she became nauseated and vomited several times. She reports some chest pain. Denies abdominal pain. She states she has had some mild diarrhea over last several days. She denies any fever or chills patient states she has had some nasal congestion. She states she took Sudafed prior to her symptoms starting. She states she has had surgery before but never had similar symptoms. She denies any ear pain or ear ringing. She denies headache. No falls or injuries. No syncopal episode"  Physical Exam  Pulse 87   Temp 97.6 F (36.4 C)   Resp 20   Ht 5\' 5"  (1.651 m)   Wt 79.4 kg (175 lb)   SpO2 98%   BMI 29.12 kg/m   Physical Exam  Well appearing. NAD.    ED Course  Procedures  MDM  31 year old female received from PA Kirichenko at sign out for likely panic attack versus vertigo pending troponin and hCG  and likely discharge to home. Valium and IV fluids given in the emergency department. Asymptomatic at this time. Vital signs stable. No acute distress. Troponin is unremarkable. Will discharge the patient to home with PCP follow-up and a short course of Zofran and meclizine.      Barkley BoardsMcDonald, Mia A, PA-C 10/10/16 1740    Jacalyn LefevreHaviland, Julie, MD 10/10/16 1755

## 2016-10-10 NOTE — ED Triage Notes (Signed)
Pt is from home.  She has a Hx of anxiety.  Pt states that she took a sudafed today and then a Meloxicam for chronic back paid.  This was at about 1230.  Pt then began to have lightheadedness, tachypnea and [facility]

## 2016-10-10 NOTE — ED Notes (Signed)
Pt reports improvement in nausea.  Pt given ginger ale.  She has been eating crackers and drinking ginger ale for the last 1/2 hour.

## 2016-10-10 NOTE — ED Notes (Signed)
Bed: [facility] Expected date:  Expected time:  Means of arrival:  Comments: 

## 2016-10-26 ENCOUNTER — Ambulatory Visit (INDEPENDENT_AMBULATORY_CARE_PROVIDER_SITE_OTHER): Payer: Medicaid Other | Admitting: Obstetrics

## 2016-10-26 ENCOUNTER — Encounter: Payer: Self-pay | Admitting: Obstetrics

## 2016-10-26 ENCOUNTER — Other Ambulatory Visit (HOSPITAL_COMMUNITY)
Admission: RE | Admit: 2016-10-26 | Discharge: 2016-10-26 | Disposition: A | Discharge status: Home / Self Care | Payer: Medicaid Other | Source: Ambulatory Visit | Attending: Obstetrics | Admitting: Obstetrics

## 2016-10-26 VITALS — BP 115/68 | HR 81 | Temp 97.9°F | Ht 65.0 in | Wt 177.8 lb

## 2016-10-26 DIAGNOSIS — Z0000 Encounter for general adult medical examination without abnormal findings: Secondary | ICD-10-CM | POA: Diagnosis not present

## 2016-10-26 DIAGNOSIS — Z01419 Encounter for gynecological examination (general) (routine) without abnormal findings: Secondary | ICD-10-CM | POA: Diagnosis not present

## 2016-10-26 DIAGNOSIS — N898 Other specified noninflammatory disorders of vagina: Secondary | ICD-10-CM

## 2016-10-26 DIAGNOSIS — N946 Dysmenorrhea, unspecified: Secondary | ICD-10-CM

## 2016-10-26 DIAGNOSIS — N943 Premenstrual tension syndrome: Secondary | ICD-10-CM

## 2016-10-26 DIAGNOSIS — F17210 Nicotine dependence, cigarettes, uncomplicated: Secondary | ICD-10-CM

## 2016-10-26 MED ORDER — IBUPROFEN 800 MG PO TABS: 800.0000 mg | ORAL_TABLET | Freq: Three times a day (TID) | ORAL | 5 refills | Status: DC | PRN

## 2016-10-26 MED ORDER — VARENICLINE TARTRATE 1 MG PO TABS: 1.0000 mg | ORAL_TABLET | Freq: Two times a day (BID) | ORAL | 1 refills | Status: DC

## 2016-10-26 MED ORDER — VARENICLINE TARTRATE 0.5 MG X 11 & 1 MG X 42 PO MISC: ORAL | 2 refills | Status: DC

## 2016-10-26 MED ORDER — OXYCODONE HCL 10 MG PO TABS: 10.0000 mg | ORAL_TABLET | Freq: Every day | ORAL | 0 refills | Status: DC | PRN

## 2016-10-26 MED ORDER — PAROXETINE HCL 20 MG PO TABS: 20.0000 mg | ORAL_TABLET | Freq: Every day | ORAL | 11 refills | Status: DC

## 2016-10-26 MED ORDER — VARENICLINE TARTRATE 0.5 MG X 11 & 1 MG X 42 PO MISC: ORAL | 0 refills | Status: DC

## 2016-10-26 NOTE — Progress Notes (Addendum)
Subjective:        Marissa Smith is a 31 y.o. female here for a routine exam.  Current complaints: Painful periods and mood swings, meanness and uncontrollable crying during menses.  Personal health questionnaire:  Is patient Ashkenazi Jewish, have a family history of breast and/or ovarian cancer: no Is there a family history of uterine cancer diagnosed at age < 95, gastrointestinal cancer, urinary tract cancer, family member who is a Personnel officer syndrome-associated carrier: no Is the patient overweight and hypertensive, family history of diabetes, personal history of gestational diabetes, preeclampsia or PCOS: no Is patient over 42, have PCOS,  family history of premature CHD under age 57, diabetes, smoke, have hypertension or peripheral artery disease:  no At any time, has a partner hit, kicked or otherwise hurt or frightened you?: no Over the past 2 weeks, have you felt down, depressed or hopeless?: no Over the past 2 weeks, have you felt little interest or pleasure in doing things?:no   Gynecologic History Patient's last menstrual period was 10/16/2016 (exact date). Contraception: none Last Pap: 2017. Results were: normal Last mammogram: n/a. Results were: n/a  Obstetric History OB History  Gravida Para Term Preterm AB Living  4 0 0 0 1 3  SAB TAB Ectopic Multiple Live Births  0 0 0 0 3    # Outcome Date GA Lbr Len/2nd Weight Sex Delivery Anes PTL Lv  4 AB           3 Gravida         LIV  2 Gravida         LIV  1 Gravida         LIV      Past Medical History:  Diagnosis Date  . Back pain, chronic   . Cat allergies   . Depressed bipolar I disorder (HCC)   . Seasonal allergies     Past Surgical History:  Procedure Laterality Date  . CHOLECYSTECTOMY  2010   lap chole     Current Outpatient Prescriptions:  .  cetirizine (ZYRTEC ALLERGY) 10 MG tablet, Take 1 tablet (10 mg total) by mouth daily., Disp: 30 tablet, Rfl: 1 .  fluticasone (FLONASE) 50 MCG/ACT nasal spray,  Place 2 sprays into both nostrils daily., Disp: 16 g, Rfl: 0 .  ibuprofen (ADVIL,MOTRIN) 800 MG tablet, Take 1 tablet (800 mg total) by mouth every 8 (eight) hours as needed for moderate pain., Disp: 30 tablet, Rfl: 5 .  methocarbamol (ROBAXIN) 750 MG tablet, Take 750 mg by mouth every 8 (eight) hours as needed for muscle spasms., Disp: , Rfl:  .  Multiple Vitamin (MULTIVITAMIN) tablet, Take 1 tablet by mouth daily., Disp: , Rfl:  .  acetaminophen (TYLENOL) 500 MG tablet, Take 500-1,000 mg by mouth 2 (two) times daily as needed for moderate pain., Disp: , Rfl:  .  cyclobenzaprine (FLEXERIL) 10 MG tablet, Take 1 tablet (10 mg total) by mouth 2 (two) times daily as needed for muscle spasms. (Patient not taking: Reported on 10/26/2016), Disp: 20 tablet, Rfl: 0 .  meclizine (ANTIVERT) 25 MG tablet, Take 1 tablet (25 mg total) by mouth 3 (three) times daily as needed for dizziness. (Patient not taking: Reported on 10/26/2016), Disp: 30 tablet, Rfl: 0 .  ondansetron (ZOFRAN ODT) 4 MG disintegrating tablet, Take 1 tablet (4 mg total) by mouth every 8 (eight) hours as needed for nausea or vomiting. (Patient not taking: Reported on 10/26/2016), Disp: 20 tablet, Rfl: 0 .  Oxycodone  HCl 10 MG TABS, Take 1 tablet (10 mg total) by mouth daily as needed (pain)., Disp: 30 tablet, Rfl: 0 .  PARoxetine (PAXIL) 20 MG tablet, Take 1 tablet (20 mg total) by mouth daily., Disp: 30 tablet, Rfl: 11 .  Tetrahydrozoline HCl (EYE DROPS OP), Place 1 drop into both eyes as needed (for dry eyes)., Disp: , Rfl:  .  varenicline (CHANTIX CONTINUING MONTH PAK) 1 MG tablet, Take 1 tablet (1 mg total) by mouth 2 (two) times daily., Disp: 60 tablet, Rfl: 1 .  varenicline (CHANTIX CONTINUING MONTH PAK) 1 MG tablet, Take 1 tablet (1 mg total) by mouth 2 (two) times daily., Disp: 60 tablet, Rfl: 1 .  varenicline (CHANTIX CONTINUING MONTH PAK) 1 MG tablet, Take 1 tablet (1 mg total) by mouth 2 (two) times daily., Disp: 60 tablet, Rfl: 1 .   varenicline (CHANTIX STARTING MONTH PAK) 0.5 MG X 11 & 1 MG X 42 tablet, Take one 0.5 mg tablet by mouth once daily for 3 days, then increase to one 0.5 mg tablet twice daily for 4 days, then increase to one 1 mg tablet twice daily., Disp: 53 tablet, Rfl: 2 .  zolpidem (AMBIEN) 10 MG tablet, Take 10 mg by mouth at bedtime as needed for sleep. , Disp: , Rfl: 3 Allergies  Allergen Reactions  . Darvocet [Propoxyphene N-Acetaminophen] Nausea And Vomiting and Other (See Comments)    Reaction:  Dizziness   . Miconazole Nitrate Swelling and Rash    Social History  Substance Use Topics  . Smoking status: Light Tobacco Smoker    Packs/day: 0.25    Types: Cigarettes  . Smokeless tobacco: Never Used  . Alcohol use No    Family History  Problem Relation Age of Onset  . Heart failure Father       Review of Systems  Constitutional: negative for fatigue and weight loss Respiratory: negative for cough and wheezing Cardiovascular: negative for chest pain, fatigue and palpitations Gastrointestinal: negative for abdominal pain and change in bowel habits Musculoskeletal:negative for myalgias Neurological: negative for gait problems and tremors Behavioral/Psych: positive for mood swings, irritability and crying during menses Endocrine: negative for temperature intolerance    Genitourinary:positive for abnormal menstrual periods Integument/breast: negative for breast lump, breast tenderness, nipple discharge and skin lesion(s)    Objective:       BP 115/68   Pulse 81   Temp 97.9 F (36.6 C)   Ht 5\' 5"  (1.651 m)   Wt 177 lb 12.8 oz (80.6 kg)   LMP 10/16/2016 (Exact Date)   BMI 29.59 kg/m  General:   alert  Skin:   no rash or abnormalities  Lungs:   clear to auscultation bilaterally  Heart:   regular rate and rhythm, S1, S2 normal, no murmur, click, rub or gallop  Breasts:   normal without suspicious masses, skin or nipple changes or axillary nodes  Abdomen:  normal findings: no  organomegaly, soft, non-tender and no hernia  Pelvis:  External genitalia: normal general appearance Urinary system: urethral meatus normal and bladder without fullness, nontender Vaginal: normal without tenderness, induration or masses Cervix: normal appearance Adnexa: normal bimanual exam Uterus: anteverted and non-tender, normal size   Lab Review Urine pregnancy test Labs reviewed yes Radiologic studies reviewed no  50% of 20 min visit spent on counseling and coordination of care.          Assessment and Plan:   1. Encounter for routine gynecological examination with Papanicolaou smear of cervix Rx: -  Cytology - PAP  2. Dysmenorrhea Rx: - Oxycodone HCl 10 MG TABS; Take 1 tablet (10 mg total) by mouth daily as needed (pain).  Dispense: 30 tablet; Refill: 0 - ibuprofen (ADVIL,MOTRIN) 800 MG tablet; Take 1 tablet (800 mg total) by mouth every 8 (eight) hours as needed for moderate pain.  Dispense: 30 tablet; Refill: 5  3. PMS (premenstrual syndrome) Rx; - - PARoxetine (PAXIL) 20 MG tablet; Take 1 tablet (20 mg total) by mouth daily.  Dispense: 30 tablet; Refill: 11  4. Vaginal discharge Rx: - Cervicovaginal ancillary only  5. Tobacco dependence due to cigarettes Rx: - varenicline (CHANTIX CONTINUING MONTH PAK) 1 MG tablet; Take 1 tablet (1 mg total) by mouth 2 (two) times daily.  Dispense: 60 tablet; Refill: 1 - varenicline (CHANTIX STARTING MONTH PAK) 0.5 MG X 11 & 1 MG X 42 tablet; Take one 0.5 mg tablet by mouth once daily for 3 days, then increase to one 0.5 mg tablet twice daily for 4 days, then increase to one 1 mg tablet twice daily.  Dispense: 53 tablet; Refill: 2  6. Contraceptive management - pleased with IUD    Education reviewed: calcium supplements, depression evaluation, low fat, low cholesterol diet, safe sex/STD prevention, self breast exams and weight bearing exercise. Contraception: IUD. Follow up in: 1 year.   Meds ordered this encounter   Medications  . Oxycodone HCl 10 MG TABS    Sig: Take 1 tablet (10 mg total) by mouth daily as needed (pain).    Dispense:  30 tablet    Refill:  0  . PARoxetine (PAXIL) 20 MG tablet    Sig: Take 1 tablet (20 mg total) by mouth daily.    Dispense:  30 tablet    Refill:  11  . DISCONTD: varenicline (CHANTIX STARTING MONTH PAK) 0.5 MG X 11 & 1 MG X 42 tablet    Sig: Take one 0.5 mg tablet by mouth once daily for 3 days, then increase to one 0.5 mg tablet twice daily for 4 days, then increase to one 1 mg tablet twice daily.    Dispense:  53 tablet    Refill:  0  . DISCONTD: varenicline (CHANTIX CONTINUING MONTH PAK) 1 MG tablet    Sig: Take 1 tablet (1 mg total) by mouth 2 (two) times daily.    Dispense:  60 tablet    Refill:  1  . ibuprofen (ADVIL,MOTRIN) 800 MG tablet    Sig: Take 1 tablet (800 mg total) by mouth every 8 (eight) hours as needed for moderate pain.    Dispense:  30 tablet    Refill:  5  . DISCONTD: varenicline (CHANTIX CONTINUING MONTH PAK) 1 MG tablet    Sig: Take 1 tablet (1 mg total) by mouth 2 (two) times daily.    Dispense:  60 tablet    Refill:  1  . varenicline (CHANTIX CONTINUING MONTH PAK) 1 MG tablet    Sig: Take 1 tablet (1 mg total) by mouth 2 (two) times daily.    Dispense:  60 tablet    Refill:  1  . varenicline (CHANTIX CONTINUING MONTH PAK) 1 MG tablet    Sig: Take 1 tablet (1 mg total) by mouth 2 (two) times daily.    Dispense:  60 tablet    Refill:  1  . varenicline (CHANTIX CONTINUING MONTH PAK) 1 MG tablet    Sig: Take 1 tablet (1 mg total) by mouth 2 (two) times daily.  Dispense:  60 tablet    Refill:  1  . varenicline (CHANTIX STARTING MONTH PAK) 0.5 MG X 11 & 1 MG X 42 tablet    Sig: Take one 0.5 mg tablet by mouth once daily for 3 days, then increase to one 0.5 mg tablet twice daily for 4 days, then increase to one 1 mg tablet twice daily.    Dispense:  53 tablet    Refill:  2   No orders of the defined types were placed in this  encounter.      Patient ID: Jaynee EaglesLindsay O Jeffords, female   DOB: October 17, 1985, 31 y.o.   MRN: [id number]

## 2016-10-26 NOTE — Progress Notes (Signed)
Patient presents for Annual Exam with PAP and Cultures. US done earlier this year could not locate IUD. The pain in her Right breast is gone, had it for about 3 weeks.

## 2016-10-26 NOTE — Patient Instructions (Addendum)
Premenstrual Syndrome Premenstrual syndrome (PMS) is a group of physical, emotional, and behavioral symptoms that affect women of childbearing age. PMS starts 1-2 weeks before the start of a woman's period and goes away a few days after the period starts. It often recurs in a predictable pattern. PMS can range from mild to severe. When it is severe, it is called premenstrual dysphoric disorder (PMDD). PMS can interfere in many ways with normal daily activities. What are the causes? The cause of this condition is not known, but it seems to be related to hormone changes that happen before menstruation. What are the signs or symptoms? Symptoms of this condition often happen every month. They go away completely after your period starts. Physical symptoms include:  Bloating.  Breast pain.  Headaches.  Extreme fatigue.  Backaches.  Swelling of the hands and feet.  Weight gain.  Hot flashes.  Emotional and behavioral symptoms include:  Mood swings.  Depression.  Angry outbursts.  Irritability.  Anxiety.  Crying spells.  Food cravings or appetite changes.  Changes in sexual desire.  Confusion.  Aggression.  Social withdrawal.  Poor concentration.  How is this diagnosed? This condition is diagnosed if symptoms of PMS:  Are present in the 5 days before your period starts.  End within 4 days after your period starts.  Happen at least 3 months in a row.  Interfere with some of your normal activities.  Other conditions that can cause some of these symptoms must be ruled out before PMS can be diagnosed. How is this treated? This condition [place] be treated by:  Maintaining a healthy lifestyle. This includes eating a balanced diet and exercising regularly.  Taking medicines. Medicines can help relieve symptoms such as cramps, aches, pains, headaches, and breast tenderness. Depending on the severity of the condition, your health care provider [place]  recommend: ? Over-the-counter pain medicines. ? Prescription medicines for PMDD.  Follow these instructions at home: Eating and drinking   Eat a well-balanced diet.  Avoid caffeine and alcohol.  Limit the amount of salt and salty foods you eat. This will help lessen bloating.  Drink enough fluid to keep your urine clear or pale yellow.  Take a multivitamin if told to by your health care provider. Lifestyle  Do not use any tobacco products, such as cigarettes, chewing tobacco, and e-cigarettes. If you need help quitting, ask your health care provider.  Exercise regularly as suggested by your health care provider.  Get enough sleep.  Practice relaxation techniques.  Limit stress. Other Instructions  For 2-3 months, write down your symptoms, their severity, and how long they last. This will help your health care provider choose the best treatment for you.  Take over-the-counter and prescription medicines only as told by your health care provider.  If you are using oral contraceptive pills, use them as told by your health care provider. This information is not intended to replace advice given to you by your health care provider. Make sure you discuss any questions you have with your health care provider. Document Released: 03/27/2000 Document Revised: 05/01/2015 Document Reviewed: 12/28/2014 Elsevier Interactive Patient Education  2018 [employer]  

## 2016-10-27 LAB — CERVICOVAGINAL ANCILLARY ONLY
Bacterial vaginitis: NEGATIVE
Candida vaginitis: NEGATIVE
Chlamydia: NEGATIVE
Neisseria Gonorrhea: NEGATIVE
Trichomonas: NEGATIVE

## 2016-10-28 LAB — CYTOLOGY - PAP
Diagnosis: NEGATIVE
HPV: NOT DETECTED

## 2016-12-09 ENCOUNTER — Encounter: Payer: Self-pay | Admitting: Obstetrics

## 2016-12-09 ENCOUNTER — Ambulatory Visit (INDEPENDENT_AMBULATORY_CARE_PROVIDER_SITE_OTHER): Payer: Medicaid Other | Admitting: Obstetrics

## 2016-12-09 VITALS — BP 124/71 | HR 71 | Wt 178.4 lb

## 2016-12-09 DIAGNOSIS — J301 Allergic rhinitis due to pollen: Secondary | ICD-10-CM

## 2016-12-09 DIAGNOSIS — N946 Dysmenorrhea, unspecified: Secondary | ICD-10-CM | POA: Diagnosis not present

## 2016-12-09 DIAGNOSIS — M549 Dorsalgia, unspecified: Secondary | ICD-10-CM

## 2016-12-09 DIAGNOSIS — Z3009 Encounter for other general counseling and advice on contraception: Secondary | ICD-10-CM | POA: Diagnosis not present

## 2016-12-09 DIAGNOSIS — N943 Premenstrual tension syndrome: Secondary | ICD-10-CM

## 2016-12-09 DIAGNOSIS — F17210 Nicotine dependence, cigarettes, uncomplicated: Secondary | ICD-10-CM

## 2016-12-09 MED ORDER — LORATADINE 10 MG PO TABS: 10.0000 mg | ORAL_TABLET | Freq: Every day | ORAL | 11 refills | Status: DC

## 2016-12-09 MED ORDER — FLUTICASONE PROPIONATE 50 MCG/ACT NA SUSP: 2.0000 | Freq: Every day | NASAL | 5 refills | Status: DC

## 2016-12-09 MED ORDER — OXYCODONE HCL 10 MG PO TABS: 10.0000 mg | ORAL_TABLET | Freq: Four times a day (QID) | ORAL | 0 refills | Status: DC | PRN

## 2016-12-09 MED ORDER — METHOCARBAMOL 750 MG PO TABS: 750.0000 mg | ORAL_TABLET | Freq: Three times a day (TID) | ORAL | 5 refills | Status: DC | PRN

## 2016-12-09 MED ORDER — PAROXETINE HCL 20 MG PO TABS: 20.0000 mg | ORAL_TABLET | Freq: Every day | ORAL | 11 refills | Status: DC

## 2016-12-09 NOTE — Progress Notes (Signed)
Patient is in the office today for uterine pain- her cycle is about to start and she is in pain. Patient is in pain that is causing nausea.

## 2016-12-09 NOTE — Progress Notes (Addendum)
Patient ID: Marissa Smith, female   DOB: 12/21/1985, 31 y.o.   MRN: [id number]  Chief Complaint  Patient presents with  . Gynecologic Exam    HPI Marissa Smith is a 31 y.o. female.  Complains of severe cramping with periods, and backache from lifting injury at work. HPI  Past Medical History:  Diagnosis Date  . Back pain, chronic   . Cat allergies   . Depressed bipolar I disorder (HCC)   . Seasonal allergies     Past Surgical History:  Procedure Laterality Date  . CHOLECYSTECTOMY  2010   lap chole    Family History  Problem Relation Age of Onset  . Heart failure Father     Social History Social History  Substance Use Topics  . Smoking status: Light Tobacco Smoker    Packs/day: 0.25    Types: Cigarettes  . Smokeless tobacco: Never Used  . Alcohol use No    Allergies  Allergen Reactions  . Darvocet [Propoxyphene N-Acetaminophen] Nausea And Vomiting and Other (See Comments)    Reaction:  Dizziness   . Miconazole Nitrate Swelling and Rash    Current Outpatient Prescriptions  Medication Sig Dispense Refill  . acetaminophen (TYLENOL) 500 MG tablet Take 500-1,000 mg by mouth 2 (two) times daily as needed for moderate pain.    . cetirizine (ZYRTEC ALLERGY) 10 MG tablet Take 1 tablet (10 mg total) by mouth daily. 30 tablet 1  . fluticasone (FLONASE) 50 MCG/ACT nasal spray Place 2 sprays into both nostrils daily. 16 g 5  . ibuprofen (ADVIL,MOTRIN) 800 MG tablet Take 1 tablet (800 mg total) by mouth every 8 (eight) hours as needed for moderate pain. 30 tablet 5  . methocarbamol (ROBAXIN) 750 MG tablet Take 1 tablet (750 mg total) by mouth every 8 (eight) hours as needed for muscle spasms. 42 tablet 5  . Tetrahydrozoline HCl (EYE DROPS OP) Place 1 drop into both eyes as needed (for dry eyes).    . cyclobenzaprine (FLEXERIL) 10 MG tablet Take 1 tablet (10 mg total) by mouth 2 (two) times daily as needed for muscle spasms. (Patient not taking: Reported on 10/26/2016) 20 tablet 0   . loratadine (CLARITIN) 10 MG tablet Take 1 tablet (10 mg total) by mouth daily. 30 tablet 11  . meclizine (ANTIVERT) 25 MG tablet Take 1 tablet (25 mg total) by mouth 3 (three) times daily as needed for dizziness. (Patient not taking: Reported on 10/26/2016) 30 tablet 0  . Multiple Vitamin (MULTIVITAMIN) tablet Take 1 tablet by mouth daily.    . Oxycodone HCl 10 MG TABS Take 1 tablet (10 mg total) by mouth every 6 (six) hours as needed (pain). 20 tablet 0  . PARoxetine (PAXIL) 20 MG tablet Take 1 tablet (20 mg total) by mouth daily. 30 tablet 11  . zolpidem (AMBIEN) 10 MG tablet Take 10 mg by mouth at bedtime as needed for sleep.   3   No current facility-administered medications for this visit.     Review of Systems Review of Systems Constitutional: negative for fatigue and weight loss Respiratory: negative for cough and wheezing Cardiovascular: negative for chest pain, fatigue and palpitations Gastrointestinal: negative for abdominal pain and change in bowel habits Genitourinary:positive for painful periods Integument/breast: negative for nipple discharge Musculoskeletal:positive for myalgias - Backache Neurological: negative for gait problems and tremors Behavioral/Psych: positive for PMS and Bipolar Disorder Endocrine: negative for temperature intolerance      Blood pressure 124/71, pulse 71, weight 178 lb 6.4  oz (80.9 kg), last menstrual period 11/20/2016.  Physical Exam Physical Exam:  Deferred  50% of 15 min visit spent on counseling and coordination of care.    Data Reviewed Labs Previous ultrasound  Assessment and Plan:    1. Dysmenorrhea Rx: - Oxycodone HCl 10 MG TABS; Take 1 tablet (10 mg total) by mouth every 6 (six) hours as needed (pain).  Dispense: 20 tablet; Refill: 0  2. Backache symptom Rx: - methocarbamol (ROBAXIN) 750 MG tablet; Take 1 tablet (750 mg total) by mouth every 8 (eight) hours as needed for muscle spasms.  Dispense: 42 tablet; Refill: 5  3.  PMS (premenstrual syndrome) Rx: - PARoxetine (PAXIL) 20 MG tablet; Take 1 tablet (20 mg total) by mouth daily.  Dispense: 30 tablet; Refill: 11  4. Seasonal allergic rhinitis due to pollen Rx: - loratadine (CLARITIN) 10 MG tablet; Take 1 tablet (10 mg total) by mouth daily.  Dispense: 30 tablet; Refill: 11 - fluticasone (FLONASE) 50 MCG/ACT nasal spray; Place 2 sprays into both nostrils daily.  Dispense: 16 g; Refill: 5  5. Encounter for other general counseling and advice on contraception - wants Nexplanon  6. Tobacco dependence due to cigarettes - tried Chantix, but had severe side effects of nausea and visual changes    Plan    Follow up in 1 week for Nexplanon insertion  No orders of the defined types were placed in this encounter.  Meds ordered this encounter  Medications  . Oxycodone HCl 10 MG TABS    Sig: Take 1 tablet (10 mg total) by mouth every 6 (six) hours as needed (pain).    Dispense:  20 tablet    Refill:  0  . methocarbamol (ROBAXIN) 750 MG tablet    Sig: Take 1 tablet (750 mg total) by mouth every 8 (eight) hours as needed for muscle spasms.    Dispense:  42 tablet    Refill:  5  . loratadine (CLARITIN) 10 MG tablet    Sig: Take 1 tablet (10 mg total) by mouth daily.    Dispense:  30 tablet    Refill:  11  . fluticasone (FLONASE) 50 MCG/ACT nasal spray    Sig: Place 2 sprays into both nostrils daily.    Dispense:  16 g    Refill:  5  . PARoxetine (PAXIL) 20 MG tablet    Sig: Take 1 tablet (20 mg total) by mouth daily.    Dispense:  30 tablet    Refill:  11

## 2016-12-23 ENCOUNTER — Ambulatory Visit: Payer: Medicaid Other | Admitting: Obstetrics

## 2016-12-23 ENCOUNTER — Emergency Department (HOSPITAL_COMMUNITY)
Admission: EM | Admit: 2016-12-23 | Discharge: 2016-12-23 | Disposition: A | Discharge status: Home / Self Care | Payer: Medicaid Other | Attending: Emergency Medicine | Admitting: Emergency Medicine

## 2016-12-23 ENCOUNTER — Encounter (HOSPITAL_COMMUNITY): Payer: Self-pay

## 2016-12-23 DIAGNOSIS — F17210 Nicotine dependence, cigarettes, uncomplicated: Secondary | ICD-10-CM | POA: Insufficient documentation

## 2016-12-23 DIAGNOSIS — Z79899 Other long term (current) drug therapy: Secondary | ICD-10-CM | POA: Insufficient documentation

## 2016-12-23 DIAGNOSIS — R109 Unspecified abdominal pain: Secondary | ICD-10-CM

## 2016-12-23 DIAGNOSIS — R1084 Generalized abdominal pain: Secondary | ICD-10-CM | POA: Diagnosis not present

## 2016-12-23 LAB — COMPREHENSIVE METABOLIC PANEL
ALT: 23 U/L (ref 14–54)
AST: 24 U/L (ref 15–41)
Albumin: 3.9 g/dL (ref 3.5–5.0)
Alkaline Phosphatase: 42 U/L (ref 38–126)
Anion gap: 6 (ref 5–15)
BUN: 7 mg/dL (ref 6–20)
CO2: 23 mmol/L (ref 22–32)
Calcium: 9.5 mg/dL (ref 8.9–10.3)
Chloride: 108 mmol/L (ref 101–111)
Creatinine, Ser: 0.76 mg/dL (ref 0.44–1.00)
GFR calc Af Amer: >60 mL/min (ref >60)
GFR calc non Af Amer: >60 mL/min (ref >60)
Glucose, Bld: 108 mg/dL — ABNORMAL HIGH (ref 65–99)
Potassium: 4.3 mmol/L (ref 3.5–5.1)
Sodium: 137 mmol/L (ref 135–145)
Total Bilirubin: 0.6 mg/dL (ref 0.3–1.2)
Total Protein: 7.1 g/dL (ref 6.5–8.1)

## 2016-12-23 LAB — LIPASE, BLOOD: Lipase: 25 U/L (ref 11–51)

## 2016-12-23 LAB — CBC
HCT: 47.4 % — ABNORMAL HIGH (ref 36.0–46.0)
Hemoglobin: 15.3 g/dL — ABNORMAL HIGH (ref 12.0–15.0)
MCH: 30.8 pg (ref 26.0–34.0)
MCHC: 32.3 g/dL (ref 30.0–36.0)
MCV: 95.6 fL (ref 78.0–100.0)
Platelets: 246 10*3/uL (ref 150–400)
RBC: 4.96 MIL/uL (ref 3.87–5.11)
RDW: 13.9 % (ref 11.5–15.5)
WBC: 8 10*3/uL (ref 4.0–10.5)

## 2016-12-23 LAB — URINALYSIS, ROUTINE W REFLEX MICROSCOPIC
Bilirubin Urine: NEGATIVE
Glucose, UA: NEGATIVE mg/dL
Hgb urine dipstick: NEGATIVE
Ketones, ur: 5 mg/dL — AB
Leukocytes, UA: NEGATIVE
Nitrite: NEGATIVE
Protein, ur: NEGATIVE mg/dL
Specific Gravity, Urine: 1.027 (ref 1.005–1.030)
pH: 5 (ref 5.0–8.0)

## 2016-12-23 LAB — PREGNANCY, URINE: Preg Test, Ur: NEGATIVE

## 2016-12-23 MED ORDER — DICYCLOMINE HCL 10 MG/ML IM SOLN
20.0000 mg | Freq: Once | INTRAMUSCULAR | Status: AC
  Administered 2016-12-23: 20 mg via INTRAMUSCULAR
  Filled 2016-12-23: qty 2

## 2016-12-23 MED ORDER — ONDANSETRON 4 MG PO TBDP
4.0000 mg | ORAL_TABLET | Freq: Once | ORAL | Status: AC
  Administered 2016-12-23: 4 mg via ORAL
  Filled 2016-12-23: qty 1

## 2016-12-23 MED ORDER — OXYCODONE-ACETAMINOPHEN 5-325 MG PO TABS
1.0000 | ORAL_TABLET | Freq: Once | ORAL | Status: AC
  Administered 2016-12-23: 1 via ORAL
  Filled 2016-12-23: qty 1

## 2016-12-23 MED ORDER — DICYCLOMINE HCL 20 MG PO TABS: 20.0000 mg | ORAL_TABLET | Freq: Four times a day (QID) | ORAL | 0 refills | Status: DC | PRN

## 2016-12-23 NOTE — ED Provider Notes (Signed)
[facility] Provider Note   CSN: [id number] Arrival date & time: 12/23/16  1007     History   Chief Complaint Chief Complaint  Patient presents with  . Abdominal Pain    HPI Marissa Smith is a 31 y.o. female.  HPI   31 year old female with abdominal pain. Onset yesterday evening. Pain is diffuse but worse around mid abdomen. Waxes and wanes. She states that they pain "comes in waves." No appreciable exacerbating relieving factors. Associated with nausea, vomiting and diarrhea. No fevers or chills. No unusual vaginal bleeding or discharge. No urinary complaints. Surgical history is significant for cholecystectomy. She has taken ibuprofen with mild improvement.  Past Medical History:  Diagnosis Date  . Back pain, chronic   . Cat allergies   . Depressed bipolar I disorder (HCC)   . Seasonal allergies     Patient Active Problem List   Diagnosis Date Noted  . Encounter for IUD insertion 08/14/2015  . Dysmenorrhea 10/31/2012  . Abnormal uterine bleeding (AUB) 10/31/2012    Past Surgical History:  Procedure Laterality Date  . CHOLECYSTECTOMY  2010   lap chole    OB History    Gravida Para Term Preterm AB Living   4 0 0 0 1 3   SAB TAB Ectopic Multiple Live Births   0 0 0 0 3       Home Medications    Prior to Admission medications   Medication Sig Start Date End Date Taking? Authorizing Provider  acetaminophen (TYLENOL) 500 MG tablet Take 500-1,000 mg by mouth 2 (two) times daily as needed for moderate pain.    [provider]  cetirizine (ZYRTEC ALLERGY) 10 MG tablet Take 1 tablet (10 mg total) by mouth daily. 06/13/16   Everlene Farrieransie, William, PA-C  cyclobenzaprine (FLEXERIL) 10 MG tablet Take 1 tablet (10 mg total) by mouth 2 (two) times daily as needed for muscle spasms. Patient not taking: Reported on 10/26/2016 09/04/16   Deatra Canterxford, William J, FNP  fluticasone [facility](FLONASE) 50 MCG/ACT nasal spray Place 2 sprays into both nostrils daily. 12/09/16   Brock BadHarper,  Charles A, MD  ibuprofen (ADVIL,MOTRIN) 800 MG tablet Take 1 tablet (800 mg total) by mouth every 8 (eight) hours as needed for moderate pain. 10/26/16   Brock BadHarper, Charles A, MD  loratadine (CLARITIN) 10 MG tablet Take 1 tablet (10 mg total) by mouth daily. 12/09/16   Brock BadHarper, Charles A, MD  meclizine (ANTIVERT) 25 MG tablet Take 1 tablet (25 mg total) by mouth 3 (three) times daily as needed for dizziness. Patient not taking: Reported on 10/26/2016 10/10/16   McDonald, Pedro EarlsMia A, PA-C  methocarbamol (ROBAXIN) 750 MG tablet Take 1 tablet (750 mg total) by mouth every 8 (eight) hours as needed for muscle spasms. 12/09/16   Brock BadHarper, Charles A, MD  Multiple Vitamin (MULTIVITAMIN) tablet Take 1 tablet by mouth daily.    [provider]  Oxycodone HCl 10 MG TABS Take 1 tablet (10 mg total) by mouth every 6 (six) hours as needed (pain). 12/09/16   Brock BadHarper, Charles A, MD  PARoxetine (PAXIL) 20 MG tablet Take 1 tablet (20 mg total) by mouth daily. 12/09/16   Brock BadHarper, Charles A, MD  Tetrahydrozoline HCl (EYE DROPS OP) Place 1 drop into both eyes as needed (for dry eyes).    [provider]  zolpidem (AMBIEN) 10 MG tablet Take 10 mg by mouth at bedtime as needed for sleep.  09/23/16   [provider]    Family History Family  History  Problem Relation Age of Onset  . Heart failure Father     Social History Social History  Substance Use Topics  . Smoking status: Light Tobacco Smoker    Packs/day: 0.25    Types: Cigarettes  . Smokeless tobacco: Never Used  . Alcohol use No     Allergies   Darvocet [propoxyphene n-acetaminophen] and Miconazole nitrate   Review of Systems Review of Systems   Physical Exam Updated Vital Signs BP (!) 98/47 (BP Location: Right Arm)   Pulse 88   Temp 98.4 F (36.9 C) (Oral)   Resp 20   Ht  (1.626 m)   Wt 78 kg (172 lb)   LMP 12/12/2016 (Within Days)   SpO2 98%   BMI 29.52 kg/m   Physical Exam  Constitutional: She appears well-developed  and well-nourished. No distress.  Laying prone on the stretcher when I walked in initially. No acute distress.  HENT:  Head: Normocephalic and atraumatic.  Eyes: Conjunctivae are normal. Right eye exhibits no discharge. Left eye exhibits no discharge.  Neck: Neck supple.  Cardiovascular: Normal rate, regular rhythm and normal heart sounds.  Exam reveals no gallop and no friction rub.   No murmur heard. Pulmonary/Chest: Effort normal and breath sounds normal. No respiratory distress.  Abdominal: Soft. She exhibits no distension. There is no tenderness.  Abdomen soft. Very mild tenderness periumbilically. No rebound or guarding.  Musculoskeletal: She exhibits no edema or tenderness.  Neurological: She is alert.  Skin: Skin is warm and dry.  Psychiatric: She has a normal mood and affect. Her behavior is normal. Thought content normal.  Nursing note and vitals reviewed.    ED Treatments / Results  Labs (all labs ordered are listed, but only abnormal results are displayed) Labs Reviewed  COMPREHENSIVE METABOLIC PANEL - Abnormal; Notable for the following:       Result Value   Glucose, Bld 108 (*)    All other components within normal limits  CBC - Abnormal; Notable for the following:    Hemoglobin 15.3 (*)    HCT 47.4 (*)    All other components within normal limits  URINALYSIS, ROUTINE W REFLEX MICROSCOPIC - Abnormal; Notable for the following:    APPearance HAZY (*)    Ketones, ur 5 (*)    All other components within normal limits  LIPASE, BLOOD  PREGNANCY, URINE    EKG  EKG Interpretation None       Radiology No results found.   No results found.  Procedures Procedures (including critical care time)  Medications Ordered in ED Medications  ondansetron (ZOFRAN-ODT) disintegrating tablet 4 mg (4 mg Oral Given 12/23/16 1634)  dicyclomine (BENTYL) injection 20 mg (20 mg Intramuscular Given 12/23/16 1634)  oxyCODONE-acetaminophen (PERCOCET/ROXICET) 5-325 MG per tablet  1 tablet (1 tablet Oral Given 12/23/16 1634)     Initial Impression / Assessment and Plan / ED Course  I have reviewed the triage vital signs and the nursing notes.  Pertinent labs & imaging results that were available during my care of the patient were reviewed by me and considered in my medical decision making (see chart for details).      Final Clinical Impressions(s) / ED Diagnoses   Final diagnoses:  Abdominal pain, unspecified abdominal location    New Prescriptions New Prescriptions   No medications on file     Raeford Razor, MD 01/08/17 1330

## 2016-12-23 NOTE — ED Triage Notes (Signed)
Per Pt, Pt is coming from home with complaints of generalized mid abdominal pain that started last night along with N/V/D. Pt reports continuing to vomit mainly with movement, but the emesis is yellow liquid. Hx of Cholestectomy. Denies any vaginal bleeding, urinary symptoms.

## 2017-01-01 ENCOUNTER — Encounter (HOSPITAL_COMMUNITY): Payer: Self-pay | Admitting: Emergency Medicine

## 2017-01-01 ENCOUNTER — Ambulatory Visit (HOSPITAL_COMMUNITY)
Admission: EM | Admit: 2017-01-01 | Discharge: 2017-01-01 | Disposition: A | Discharge status: Home / Self Care | Payer: Medicaid Other | Attending: Internal Medicine | Admitting: Internal Medicine

## 2017-01-01 DIAGNOSIS — S61216A Laceration without foreign body of right little finger without damage to nail, initial encounter: Secondary | ICD-10-CM | POA: Diagnosis not present

## 2017-01-01 MED ORDER — CEPHALEXIN 500 MG PO CAPS: 500.0000 mg | ORAL_CAPSULE | Freq: Four times a day (QID) | ORAL | 0 refills | Status: DC

## 2017-01-01 NOTE — ED Triage Notes (Signed)
Pt reports she cut her right 5th digit yest while unloading a trailor  Sts she pulled her hand and cut it w/a nail  Last tetanus = w/in 2 years   Bleeding controlled... A&O x4... NAD... Ambulatory

## 2017-01-01 NOTE — ED Provider Notes (Signed)
[facility]    CSN: [id number] Arrival date & time: 01/01/17  1942     History   Chief Complaint Chief Complaint  Patient presents with  . Laceration    HPI Marissa Smith is a 31 y.o. female.   31 year old female was pulling out of a pile and an unknown female scraped against the right fifth finger over the pulp. This produced a curvilinear laceration approximately 1 cm in length. She states she cleaned it out with alcohol and peroxide, soap and water. This occurred 24 hours ago.      Past Medical History:  Diagnosis Date  . Back pain, chronic   . Cat allergies   . Depressed bipolar I disorder (HCC)   . Seasonal allergies     Patient Active Problem List   Diagnosis Date Noted  . Encounter for IUD insertion 08/14/2015  . Dysmenorrhea 10/31/2012  . Abnormal uterine bleeding (AUB) 10/31/2012    Past Surgical History:  Procedure Laterality Date  . CHOLECYSTECTOMY  2010   lap chole    OB History    Gravida Para Term Preterm AB Living   4 0 0 0 1 3   SAB TAB Ectopic Multiple Live Births   0 0 0 0 3       Home Medications    Prior to Admission medications   Medication Sig Start Date End Date Taking? Authorizing Provider  acetaminophen (TYLENOL) 500 MG tablet Take 500-1,000 mg by mouth 2 (two) times daily as needed for moderate pain.    [provider]  cephALEXin (KEFLEX) 500 MG capsule Take 1 capsule (500 mg total) by mouth 4 (four) times daily. 01/01/17   Hayden Rasmussen, NP  cetirizine (ZYRTEC ALLERGY) 10 MG tablet Take 1 tablet (10 mg total) by mouth daily. 06/13/16   Everlene Farrier, PA-C  dicyclomine (BENTYL) 20 MG tablet Take 1 tablet (20 mg total) by mouth every 6 (six) hours as needed for spasms. 12/23/16   Raeford Razor, MD  fluticasone (FLONASE) 50 MCG/ACT nasal spray Place 2 sprays into both nostrils daily. 12/09/16   Brock Bad, MD  ibuprofen (ADVIL,MOTRIN) 800 MG tablet Take 1 tablet (800 mg total) by mouth every 8 (eight) hours  as needed for moderate pain. 10/26/16   Brock Bad, MD  loratadine (CLARITIN) 10 MG tablet Take 1 tablet (10 mg total) by mouth daily. 12/09/16   Brock Bad, MD  methocarbamol (ROBAXIN) 750 MG tablet Take 1 tablet (750 mg total) by mouth every 8 (eight) hours as needed for muscle spasms. 12/09/16   Brock Bad, MD  Multiple Vitamin (MULTIVITAMIN) tablet Take 1 tablet by mouth daily.    [provider]  Oxycodone HCl 10 MG TABS Take 1 tablet (10 mg total) by mouth every 6 (six) hours as needed (pain). 12/09/16   Brock Bad, MD  zolpidem (AMBIEN) 10 MG tablet Take 10 mg by mouth at bedtime as needed for sleep.  09/23/16   [provider]    Family History Family History  Problem Relation Age of Onset  . Heart failure Father     Social History Social History  Substance Use Topics  . Smoking status: Light Tobacco Smoker    Packs/day: 0.25    Types: Cigarettes  . Smokeless tobacco: Never Used  . Alcohol use No     Allergies   Darvocet [propoxyphene n-acetaminophen] and Miconazole nitrate   Review of Systems Review of Systems  Musculoskeletal: Negative.  Skin: Positive for wound.  Neurological: Negative.   All other systems reviewed and are negative.    Physical Exam Triage Vital Signs ED Triage Vitals [01/01/17 1958]  Enc Vitals Group     BP      Pulse Rate 67     Resp 20     Temp 98.1 F (36.7 C)     Temp Source Oral     SpO2 100 %     Weight      Height      Head Circumference      Peak Flow      Pain Score 6     Pain Loc      Pain Edu?      Excl. in GC?    No data found.   Updated Vital Signs Pulse 67   Temp 98.1 F (36.7 C) (Oral)   Resp 20   LMP 12/12/2016 (Within Days)   SpO2 100%   Visual Acuity Right Eye Distance:   Left Eye Distance:   Bilateral Distance:    Right Eye Near:   Left Eye Near:    Bilateral Near:     Physical Exam  Constitutional: She is oriented to person, place, and time. She  appears well-developed and well-nourished. No distress.  Neck: Neck supple.  Cardiovascular: Normal rate.   Pulmonary/Chest: Effort normal.  Musculoskeletal: Normal range of motion. She exhibits no edema.  Flexion and extension of the involved digit is intact in all joints.  Neurological: She is alert and oriented to person, place, and time.  Skin: Skin is warm and dry. Capillary refill takes less than 2 seconds.  Approximately 1 cm curvilinear laceration produced by a nail tangentially scraping across the pulp. The laceration is relatively superficial however fatty tissue had been exposed to the edge of the wound. This tissue has now dried over and there is no current bleeding or drainage or signs of infection. There is mild swelling beneath the laceration.  Psychiatric: She has a normal mood and affect.  Nursing note and vitals reviewed.    UC Treatments / Results  Labs (all labs ordered are listed, but only abnormal results are displayed) Labs Reviewed - No data to display  EKG  EKG Interpretation None       Radiology No results found.  Procedures Procedures (including critical care time)  Medications Ordered in UC Medications - No data to display   Initial Impression / Assessment and Plan / UC Course  I have reviewed the triage vital signs and the nursing notes.  Pertinent labs & imaging results that were available during my care of the patient were reviewed by me and considered in my medical decision making (see chart for details).    Clean the wound was soap and water at least twice a day or anytime as dirty. He Joz[place] apply Neosporin ointment for the next 2 days but no longer. Keep it covered with a bandage. Watch for any signs of infection such as redness, swelling, drainage, pus and increased pain. If you see any of the symptoms return promptly. Take all of your antibiotic.  You should heal without further intervention. There still [place] be a scar. It is subject to  infection. The wound was cleaned. The patient will continue to clean it daily and follow instructions above. Antibiotics were prescribed. Up-to-date on tetanus less than one and half here ago.   Final Clinical Impressions(s) / UC Diagnoses   Final diagnoses:  Laceration of right little  finger without foreign body without damage to nail, initial encounter    New Prescriptions New Prescriptions   CEPHALEXIN (KEFLEX) 500 MG CAPSULE    Take 1 capsule (500 mg total) by mouth 4 (four) times daily.     Controlled Substance Prescriptions [place] Controlled Substance Registry consulted? Not Applicable   Hayden Rasmussen, NP 01/01/17 2039

## 2017-01-01 NOTE — ED Notes (Signed)
Wound  Care      Dressing  Applied

## 2017-01-01 NOTE — Discharge Instructions (Signed)
Clean the wound was soap and water at least twice a day or anytime as dirty. He Toure apply Neosporin ointment for the next 2 days but no longer. Keep it covered with a bandage. Watch for any signs of infection such as redness, swelling, drainage, pus and increased pain. If you see any of the symptoms return promptly. Take all of your antibiotic.

## 2017-01-07 ENCOUNTER — Other Ambulatory Visit: Payer: Self-pay | Admitting: Student

## 2017-01-07 ENCOUNTER — Inpatient Hospital Stay (HOSPITAL_COMMUNITY)
Admission: AD | Admit: 2017-01-07 | Discharge: 2017-01-07 | Disposition: A | Discharge status: Home / Self Care | Payer: Medicaid Other | Source: Ambulatory Visit | Attending: Obstetrics & Gynecology | Admitting: Obstetrics & Gynecology

## 2017-01-07 DIAGNOSIS — N6313 Unspecified lump in the right breast, lower outer quadrant: Secondary | ICD-10-CM | POA: Diagnosis not present

## 2017-01-07 DIAGNOSIS — N6310 Unspecified lump in the right breast, unspecified quadrant: Secondary | ICD-10-CM

## 2017-01-07 DIAGNOSIS — F17210 Nicotine dependence, cigarettes, uncomplicated: Secondary | ICD-10-CM | POA: Diagnosis not present

## 2017-01-07 DIAGNOSIS — N644 Mastodynia: Secondary | ICD-10-CM

## 2017-01-07 DIAGNOSIS — Z3202 Encounter for pregnancy test, result negative: Secondary | ICD-10-CM | POA: Insufficient documentation

## 2017-01-07 DIAGNOSIS — N63 Unspecified lump in unspecified breast: Secondary | ICD-10-CM | POA: Diagnosis not present

## 2017-01-07 LAB — URINALYSIS, ROUTINE W REFLEX MICROSCOPIC
Bilirubin Urine: NEGATIVE
Glucose, UA: NEGATIVE mg/dL
Hgb urine dipstick: NEGATIVE
Ketones, ur: NEGATIVE mg/dL
Nitrite: NEGATIVE
Protein, ur: NEGATIVE mg/dL
Specific Gravity, Urine: 1.02 (ref 1.005–1.030)
pH: 6 (ref 5.0–8.0)

## 2017-01-07 LAB — POCT PREGNANCY, URINE: Preg Test, Ur: NEGATIVE

## 2017-01-07 NOTE — MAU Provider Note (Signed)
History     CSN: [id number]  Arrival date and time: 01/07/17 1109  First Provider Initiated Contact with Patient 01/07/17 1324   Chief Complaint  Patient presents with  . Breast Pain   HPI Marissa Smith is a 31 y.o. female who presents for breast pain & mass. Initially felt a lump in her breast 3 months ago; by the time she had f/u with Dr. Clearance Coots, the mass was gone. States the lump returned last week and has become painful; doesn't have appt with Dr. Clearance Coots until 10/9. Reports painful lump in right breast. Rates pain 8/10 & describes as constant discomfort. Has not treated pain. Pain worse with palpation. When she presses on mass she has noticed clear discharge from nipple.   Past Medical History:  Diagnosis Date  . Back pain, chronic   . Cat allergies   . Depressed bipolar I disorder (HCC)   . Seasonal allergies     Past Surgical History:  Procedure Laterality Date  . CHOLECYSTECTOMY  2010   lap chole    Family History  Problem Relation Age of Onset  . Heart failure Father     Social History  Substance Use Topics  . Smoking status: Light Tobacco Smoker    Packs/day: 0.25    Types: Cigarettes  . Smokeless tobacco: Never Used  . Alcohol use No    Allergies:  Allergies  Allergen Reactions  . Darvocet [Propoxyphene N-Acetaminophen] Nausea And Vomiting and Other (See Comments)    Reaction:  Dizziness   . Miconazole Nitrate Swelling and Rash    Prescriptions Prior to Admission  Medication Sig Dispense Refill Last Dose  . acetaminophen (TYLENOL) 500 MG tablet Take 500-1,000 mg by mouth 2 (two) times daily as needed for moderate pain.   Unknown at Unknown time  . cephALEXin (KEFLEX) 500 MG capsule Take 1 capsule (500 mg total) by mouth 4 (four) times daily. 28 capsule 0   . cetirizine (ZYRTEC ALLERGY) 10 MG tablet Take 1 tablet (10 mg total) by mouth daily. 30 tablet 1 Unknown at Unknown time  . dicyclomine (BENTYL) 20 MG tablet Take 1 tablet (20 mg total) by mouth  every 6 (six) hours as needed for spasms. 12 tablet 0 Unknown at Unknown time  . fluticasone (FLONASE) 50 MCG/ACT nasal spray Place 2 sprays into both nostrils daily. 16 g 5 Unknown at Unknown time  . ibuprofen (ADVIL,MOTRIN) 800 MG tablet Take 1 tablet (800 mg total) by mouth every 8 (eight) hours as needed for moderate pain. 30 tablet 5 Unknown at Unknown time  . loratadine (CLARITIN) 10 MG tablet Take 1 tablet (10 mg total) by mouth daily. 30 tablet 11 Unknown at Unknown time  . methocarbamol (ROBAXIN) 750 MG tablet Take 1 tablet (750 mg total) by mouth every 8 (eight) hours as needed for muscle spasms. 42 tablet 5 Unknown at Unknown time  . Multiple Vitamin (MULTIVITAMIN) tablet Take 1 tablet by mouth daily.   Unknown at Unknown time  . Oxycodone HCl 10 MG TABS Take 1 tablet (10 mg total) by mouth every 6 (six) hours as needed (pain). 20 tablet 0 Unknown at Unknown time  . zolpidem (AMBIEN) 10 MG tablet Take 10 mg by mouth at bedtime as needed for sleep.   3 Unknown at Unknown time    Review of Systems  Constitutional: Negative.   Gastrointestinal: Negative.   Genitourinary: Negative.   Musculoskeletal:       + right breast pain   Physical Exam  Blood pressure 130/66, pulse 64, temperature 97.8 F (36.6 C), temperature source Oral, resp. rate 19, weight 178 lb 0.6 oz (80.8 kg), last menstrual period 12/12/2016, SpO2 100 %.  Physical Exam  Nursing note and vitals reviewed. Constitutional: She is oriented to person, place, and time. She appears well-developed and well-nourished. No distress.  HENT:  Head: Normocephalic and atraumatic.  Eyes: Conjunctivae are normal. Right eye exhibits no discharge. Left eye exhibits no discharge. No scleral icterus.  Neck: Normal range of motion.  Respiratory: Effort normal. No respiratory distress. Right breast exhibits mass (Mass 1x5 cm. Mobile & smooth. TTP. At 8 o'clock. ) and tenderness. Right breast exhibits no nipple discharge and no skin  change. Left breast exhibits no mass, no nipple discharge, no skin change and no tenderness. Breasts are symmetrical.  Lymphadenopathy:       Right axillary: No lateral adenopathy present.       Left axillary: No lateral adenopathy present.      Right: No supraclavicular adenopathy present.       Left: No supraclavicular adenopathy present.  Neurological: She is alert and oriented to person, place, and time.  Skin: Skin is warm and dry. She is not diaphoretic.  Psychiatric: She has a normal mood and affect. Her behavior is normal. Judgment and thought content normal.    MAU Course  Procedures Results for orders placed or performed during the hospital encounter of 01/07/17 (from the past 48 hour(s))  Urinalysis, Routine w reflex microscopic     Status: Abnormal   Collection Time: 01/07/17 11:51 AM  Result Value Ref Range   Color, Urine YELLOW YELLOW   APPearance HAZY (A) CLEAR   Specific Gravity, Urine 1.020 1.005 - 1.030   pH 6.0 5.0 - 8.0   Glucose, UA NEGATIVE NEGATIVE mg/dL   Hgb urine dipstick NEGATIVE NEGATIVE   Bilirubin Urine NEGATIVE NEGATIVE   Ketones, ur NEGATIVE NEGATIVE mg/dL   Protein, ur NEGATIVE NEGATIVE mg/dL   Nitrite NEGATIVE NEGATIVE   Leukocytes, UA LARGE (A) NEGATIVE   RBC / HPF 0-5 0 - 5 RBC/hpf   WBC, UA 0-5 0 - 5 WBC/hpf   Bacteria, UA RARE (A) NONE SEEN   Squamous Epithelial / LPF 6-30 (A) NONE SEEN   Mucus PRESENT   Pregnancy, urine POC     Status: None   Collection Time: 01/07/17 12:10 PM  Result Value Ref Range   Preg Test, Ur NEGATIVE NEGATIVE    Comment:        THE SENSITIVITY OF THIS METHODOLOGY IS >24 mIU/mL     MDM UPT negative VSS, NAD Right breast mass noted on exam. No evidence of mastitis or abscess at this time.  Will order outpatient breast imagine. Patient to f/u with Dr. Clearance Coots as scheduled.   Assessment and Plan  A; 1. Breast mass, right   2. Breast pain    P: Discharge home Right breast ultrasound ordered -- pt  provided with [facility] phone number to schedule appt Pt to keep appt with Dr. Clearance Coots on 10/9  Judeth Horn 01/07/2017, 1:24 PM

## 2017-01-07 NOTE — Discharge Instructions (Signed)
Breast Cyst A breast cyst is a sac in the breast that is filled with fluid. Breast cysts are usually noncancerous (benign). They are common among women, and they are most often located in the upper, outer portion of the breast. One or more cysts Leija develop. They form when fluid builds up inside of the breast glands. There are several types of breast cysts:  Macrocyst. This is a cyst that is about 2 inches (5.1 cm) across (in diameter).  Microcyst. This is a very small cyst that you cannot feel, but it can be seen with imaging tests such as an X-ray of the breast (mammogram) or ultrasound.  Galactocele. This is a cyst that contains milk. It Dirusso develop if you suddenly stop breastfeeding.  Breast cysts do not increase your risk of breast cancer. They usually disappear after menopause, unless you take artificial hormones (are on hormone therapy). What are the causes? The exact cause of breast cysts is not known. Possible causes include:  Blockage of tubes (ducts) in the breast glands, which leads to fluid buildup. Duct blockage Cuen result from: ? Fibrocystic breast changes. This is a common, benign condition that occurs when women go through hormonal changes during the menstrual cycle. This is a common cause of multiple breast cysts. ? Overgrowth of breast tissue or breast glands. ? Scar tissue in the breast from previous surgery.  Changes in certain female hormones (estrogen and progesterone).  What increases the risk? You Depaula be more likely to develop breast cysts if you have not gone through menopause. What are the signs or symptoms? Symptoms of a breast cyst Bonnell include:  Feeling one or more smooth, round, soft lumps (like grapes) in the breast that are easily moveable. The lump(s) Toon get bigger and more painful before your period and get smaller after your period.  Breast discomfort or pain.  How is this diagnosed? A cyst can be felt during a physical exam by your health care  provider. A mammogram and ultrasound will be done to confirm the diagnosis. Fluid Gist be removed from the cyst with a needle (fine-needle aspiration) and tested to make sure the cyst is not cancerous. How is this treated? Treatment Pietrzak not be necessary. Your health care provider Cichowski monitor the cyst to see if it goes away on its own. If the cyst is uncomfortable or gets bigger, or if you do not like how the cyst makes your breast look, you Seeley need treatment. Treatment Helling include:  Hormone treatment.  Fine-needle aspiration, to drain fluid from the cyst. There is a chance of the cyst coming back (recurring) after aspiration.  Surgery to remove the cyst.  Follow these instructions at home:  See your health care provider regularly. ? Get a yearly physical exam. ? If you are 20-40 years old, get a clinical breast exam every 1-3 years. After age 40, get this exam every year. ? Get mammograms as often as directed.  Do a breast self-exam every month, or as often as directed. Having many breast cysts, or "lumpy" breasts, Grater make it harder to feel for new lumps. Understand how your breasts normally look and feel, and write down any changes in your breasts so you can tell your health care provider about the changes. A breast self-exam involves: ? Comparing your breasts in the mirror. ? Looking for visible changes in your skin or nipples. ? Feeling for lumps or changes.  Take over-the-counter and prescription medicines only as told by your health care   provider.  Wear a supportive bra, especially when exercising.  Follow instructions from your health care provider about eating and drinking restrictions. ? Avoid caffeine. ? Cut down on salt (sodium) in what you eat and drink, especially before your menstrual period. Too much sodium can cause fluid buildup (retention), breast swelling, and discomfort.  Keep all follow-up visits as told your health care provider. This is important. Contact a  health care provider if:  You feel, or think you feel, a lump in your breast.  You notice that both breasts look or feel different than usual.  Your breast is still causing pain after your menstrual period is over.  You find new lumps or bumps that were not there before.  You feel lumps in your armpit (axilla). Get help right away if:  You have severe pain, tenderness, redness, or warmth in your breast.  You have fluid or blood leaking from your nipple.  Your breast lump becomes hard and painful.  You notice dimpling or wrinkling of the breast or nipple. This information is not intended to replace advice given to you by your health care provider. Make sure you discuss any questions you have with your health care provider. Document Released: 03/30/2005 Document Revised: 12/20/2015 Document Reviewed: 12/20/2015 Elsevier Interactive Patient Education  2017 [employer]vier Inc[employer]  

## 2017-01-07 NOTE — MAU Note (Signed)
Right Breast Pain States has a "firm lump" ; leaking clear liquid when press on it States has an appt next month but states it hurting and has more concern over it so couldn't wait

## 2017-01-15 ENCOUNTER — Other Ambulatory Visit: Payer: Medicaid Other

## 2017-01-19 ENCOUNTER — Ambulatory Visit (INDEPENDENT_AMBULATORY_CARE_PROVIDER_SITE_OTHER): Payer: Medicaid Other | Admitting: Obstetrics

## 2017-01-19 ENCOUNTER — Encounter: Payer: Self-pay | Admitting: Obstetrics

## 2017-01-19 ENCOUNTER — Encounter: Payer: Self-pay | Admitting: *Deleted

## 2017-01-19 VITALS — BP 112/60 | HR 61 | Wt 180.2 lb

## 2017-01-19 DIAGNOSIS — Z30017 Encounter for initial prescription of implantable subdermal contraceptive: Secondary | ICD-10-CM

## 2017-01-19 DIAGNOSIS — Z3202 Encounter for pregnancy test, result negative: Secondary | ICD-10-CM | POA: Diagnosis not present

## 2017-01-19 DIAGNOSIS — N946 Dysmenorrhea, unspecified: Secondary | ICD-10-CM

## 2017-01-19 MED ORDER — OXYCODONE HCL 10 MG PO TABS: 10.0000 mg | ORAL_TABLET | Freq: Four times a day (QID) | ORAL | 0 refills | Status: DC | PRN

## 2017-01-19 MED ORDER — ETONOGESTREL 68 MG ~~LOC~~ IMPL: 68.0000 mg | DRUG_IMPLANT | Freq: Once | SUBCUTANEOUS | Status: DC

## 2017-01-19 NOTE — Progress Notes (Signed)
Presents for [employer].  UPT today is NEGATIVE.  Informed Consent signed.

## 2017-01-20 ENCOUNTER — Encounter: Payer: Self-pay | Admitting: Obstetrics

## 2017-01-20 LAB — POCT URINE PREGNANCY: Preg Test, Ur: NEGATIVE

## 2017-01-20 NOTE — Progress Notes (Signed)
Nexplanon Procedure Note   PRE-OP DIAGNOSIS: desired long-term, reversible contraception ( LARC ) POST-OP DIAGNOSIS: Same  PROCEDURE: Nexplanon  placement Performing Provider: Bing Neighbors. Clearance Coots MD  Patient education prior to procedure, explained risk, benefits of Nexplanon, reviewed alternative options. Patient reported understanding. Gave consent to continue with procedure.   PROCEDURE:  Pregnancy Text :  Negative Site (check):      left arm         Sterile Preparation:   Betadinex3 Lot # [id number] Expiration Date 01 / 2021  Insertion site was selected 8 - 10 cm from medial epicondyle and marked along with guiding site using sterile marker. Procedure area was prepped and draped in a sterile fashion. 1% Lidocaine 1.5 ml given prior to procedure. Nexplanon  was inserted subcutaneously.Needle was removed from the insertion site. Nexplanon capsule was palpated by provider and patient to assure satisfactory placement. Dressing applied.  Followup: The patient tolerated the procedure well without complications.  Standard post-procedure care is explained and return precautions are given.  Siena Poehler A. Clearance Coots MD

## 2017-01-25 ENCOUNTER — Ambulatory Visit
Admission: RE | Admit: 2017-01-25 | Discharge: 2017-01-25 | Disposition: A | Discharge status: Home / Self Care | Payer: Medicaid Other | Source: Ambulatory Visit | Attending: Student | Admitting: Student

## 2017-01-25 DIAGNOSIS — N6310 Unspecified lump in the right breast, unspecified quadrant: Secondary | ICD-10-CM

## 2017-01-25 DIAGNOSIS — N644 Mastodynia: Secondary | ICD-10-CM

## 2017-02-24 ENCOUNTER — Emergency Department (HOSPITAL_COMMUNITY)
Admission: EM | Admit: 2017-02-24 | Discharge: 2017-02-24 | Disposition: A | Discharge status: Home / Self Care | Payer: Medicaid Other | Attending: Emergency Medicine | Admitting: Emergency Medicine

## 2017-02-24 ENCOUNTER — Emergency Department (HOSPITAL_COMMUNITY): Payer: Medicaid Other

## 2017-02-24 ENCOUNTER — Encounter (HOSPITAL_COMMUNITY): Payer: Self-pay | Admitting: Emergency Medicine

## 2017-02-24 DIAGNOSIS — J069 Acute upper respiratory infection, unspecified: Secondary | ICD-10-CM

## 2017-02-24 DIAGNOSIS — J302 Other seasonal allergic rhinitis: Secondary | ICD-10-CM | POA: Diagnosis not present

## 2017-02-24 DIAGNOSIS — F17210 Nicotine dependence, cigarettes, uncomplicated: Secondary | ICD-10-CM | POA: Insufficient documentation

## 2017-02-24 DIAGNOSIS — Z79899 Other long term (current) drug therapy: Secondary | ICD-10-CM | POA: Insufficient documentation

## 2017-02-24 DIAGNOSIS — R05 Cough: Secondary | ICD-10-CM | POA: Diagnosis present

## 2017-02-24 MED ORDER — BENZONATATE 100 MG PO CAPS: 100.0000 mg | ORAL_CAPSULE | Freq: Three times a day (TID) | ORAL | 0 refills | Status: DC

## 2017-02-24 NOTE — ED Triage Notes (Signed)
Patient c/o viral symptoms going on for past week. Patient states daughter has had similar symptoms but pt states she is still feeling bad. C/o productive cough in am, sore throat and chest wall pain associated with coughing so much.

## 2017-02-24 NOTE — Discharge Instructions (Signed)
As discussed, make sure you stay well-hydrated and get some rest. Viral upper respiratory infections can take up to 2 weeks to resolve and typically lasts 1-2 weeks.  Follow-up with your primary care provider. Return sooner if symptoms worsen, persistent chest pain, shortness of breath, difficulty breathing or swallowing or other new concerning symptoms in the meantime.

## 2017-02-24 NOTE — ED Provider Notes (Signed)
[facility]-EMERGENCY DEPT Provider Note   CSN: [id number] Arrival date & time: 02/24/17  1141     History   Chief Complaint Chief Complaint  Patient presents with  . Sore Throat  . Cough    HPI Marissa Smith is a 31 y.o. female with no significant past medical history presenting with cold symptoms including sore throat, cough and chest soreness from coughing. She has tried mucinex cold/flu, robitussin, claritin and flonase without relief. She reports a hx of pneumonia and since her chest was sore with coughing, she wanted to make sure it wasn't pneumonia. She states that the symptoms started with a sore throat a week ago and cough 5 days ago. Known ill contacts with daughter with same symptoms. She was tested for strep in office yesterday. No fever, chills, vomiting or other symptoms.  HPI  Past Medical History:  Diagnosis Date  . Back pain, chronic   . Cat allergies   . Depressed bipolar I disorder (HCC)   . Seasonal allergies     Patient Active Problem List   Diagnosis Date Noted  . Encounter for IUD insertion 08/14/2015  . Dysmenorrhea 10/31/2012  . Abnormal uterine bleeding (AUB) 10/31/2012    Past Surgical History:  Procedure Laterality Date  . CHOLECYSTECTOMY  2010   lap chole    OB History    Gravida Para Term Preterm AB Living   4 0 0 0 1 3   SAB TAB Ectopic Multiple Live Births   0 0 0 0 3       Home Medications    Prior to Admission medications   Medication Sig Start Date End Date Taking? Authorizing Provider  acetaminophen (TYLENOL) 500 MG tablet Take 500-1,000 mg by mouth 2 (two) times daily as needed for moderate pain.    [provider]  benzonatate (TESSALON) 100 MG capsule Take 1 capsule (100 mg total) every 8 (eight) hours by mouth. 02/24/17   Mathews RobinsonsMitchell, Bridgit Eynon B, PA-C  cephALEXin (KEFLEX) 500 MG capsule Take 1 capsule (500 mg total) by mouth 4 (four) times daily. 01/01/17   Hayden RasmussenMabe, David, NP  cetirizine (ZYRTEC  ALLERGY) 10 MG tablet Take 1 tablet (10 mg total) by mouth daily. 06/13/16   Everlene Farrieransie, William, PA-C  dicyclomine (BENTYL) 20 MG tablet Take 1 tablet (20 mg total) by mouth every 6 (six) hours as needed for spasms. 12/23/16   Raeford RazorKohut, Stephen, MD  fluticasone (FLONASE) 50 MCG/ACT nasal spray Place 2 sprays into both nostrils daily. 12/09/16   Brock BadHarper, Charles A, MD  ibuprofen (ADVIL,MOTRIN) 800 MG tablet Take 1 tablet (800 mg total) by mouth every 8 (eight) hours as needed for moderate pain. 10/26/16   Brock BadHarper, Charles A, MD  loratadine (CLARITIN) 10 MG tablet Take 1 tablet (10 mg total) by mouth daily. 12/09/16   Brock BadHarper, Charles A, MD  methocarbamol (ROBAXIN) 750 MG tablet Take 1 tablet (750 mg total) by mouth every 8 (eight) hours as needed for muscle spasms. 12/09/16   Brock BadHarper, Charles A, MD  Multiple Vitamin (MULTIVITAMIN) tablet Take 1 tablet by mouth daily.    [provider]  Oxycodone HCl 10 MG TABS Take 1 tablet (10 mg total) by mouth every 6 (six) hours as needed (pain). 01/19/17   Brock BadHarper, Charles A, MD  zolpidem (AMBIEN) 10 MG tablet Take 10 mg by mouth at bedtime as needed for sleep.  09/23/16   [provider]    Family History Family History  Problem Relation Age of  Onset  . Heart failure Father     Social History Social History   Tobacco Use  . Smoking status: Light Tobacco Smoker    Packs/day: 0.25    Types: Cigarettes  . Smokeless tobacco: Never Used  Substance Use Topics  . Alcohol use: No    Alcohol/week: 0.0 oz  . Drug use: No     Allergies   Darvocet [propoxyphene n-acetaminophen] and Miconazole nitrate   Review of Systems Review of Systems  Constitutional: Negative for chills, diaphoresis and fever.  HENT: Positive for congestion and sore throat. Negative for ear pain, trouble swallowing and voice change.        Odynophagia  Respiratory: Positive for cough and chest tightness. Negative for choking, shortness of breath, wheezing and stridor.     Cardiovascular: Negative for chest pain and palpitations.  Gastrointestinal: Positive for nausea. Negative for abdominal pain and vomiting.       Patient reporting intermittent nausea when she takes otc cold medications, no vomiting, no nausea at this time.  Genitourinary: Negative for dysuria and hematuria.  Musculoskeletal: Negative for arthralgias, back pain, gait problem, joint swelling, myalgias, neck pain and neck stiffness.  Skin: Negative for color change, pallor and rash.  Neurological: Negative for dizziness, seizures, syncope, weakness, light-headedness and headaches.     Physical Exam Updated Vital Signs BP 136/61 (BP Location: Right Arm)   Pulse 77   Temp 98.3 F (36.8 C) (Oral)   Resp 16   Wt 81.6 kg (180 lb)   LMP 02/10/2017   SpO2 97%   BMI 30.90 kg/m   Physical Exam  Constitutional: She appears well-developed and well-nourished.  Non-toxic appearance. She does not appear ill. No distress.  Afebrile, nontoxic-appearing, sitting comfortably in chair in no acute distress.  HENT:  Head: Normocephalic and atraumatic.  Right Ear: Tympanic membrane normal. No drainage. No middle ear effusion.  Left Ear: Tympanic membrane normal. No drainage.  No middle ear effusion.  Mouth/Throat: Uvula is midline, oropharynx is clear and moist and mucous membranes are normal. Mucous membranes are not pale, not dry and not cyanotic. No uvula swelling. No oropharyngeal exudate, posterior oropharyngeal edema, posterior oropharyngeal erythema or tonsillar abscesses. Tonsils are 0 on the right. Tonsils are 0 on the left. No tonsillar exudate.  Eyes: Conjunctivae and EOM are normal.  Neck: Normal range of motion. Neck supple.  Cardiovascular: Normal rate, regular rhythm and normal heart sounds.  No murmur heard. Pulmonary/Chest: Effort normal and breath sounds normal. No stridor. No respiratory distress. She has no wheezes. She has no rhonchi. She has no rales.  Abdominal: She exhibits no  distension.  Musculoskeletal: She exhibits no edema.  Neurological: She is alert.  Skin: Skin is warm and dry. No rash noted. She is not diaphoretic. No erythema. No pallor.  Psychiatric: She has a normal mood and affect.  Nursing note and vitals reviewed.    ED Treatments / Results  Labs (all labs ordered are listed, but only abnormal results are displayed) Labs Reviewed - No data to display  EKG  EKG Interpretation None       Radiology Dg Chest 2 View  Result Date: 02/24/2017 CLINICAL DATA:  Productive cough for the past week. EXAM: CHEST  2 VIEW COMPARISON:  Chest x-ray dated June 13, 2016. FINDINGS: The heart size and mediastinal contours are within normal limits. Both lungs are clear. The visualized skeletal structures are unremarkable. IMPRESSION: No active cardiopulmonary disease. Electronically Signed   By: Vickki Hearing.D.  On: 02/24/2017 12:33    Procedures Procedures (including critical care time)  Medications Ordered in ED Medications - No data to display   Initial Impression / Assessment and Plan / ED Course  I have reviewed the triage vital signs and the nursing notes.  Pertinent labs & imaging results that were available during my care of the patient were reviewed by me and considered in my medical decision making (see chart for details).     Patient presenting with 1 week of upper respiratory infection symptoms. She has been experiencing chest tightness when coughing, no shortness of breath or chest pain otherwise.  Pt CXR negative for acute infiltrate. Patients symptoms are consistent with URI, likely viral etiology. Discussed that antibiotics are not indicated for viral infections. Pt will be discharged with symptomatic treatment.   Verbalizes understanding and is agreeable with plan.  Pt is hemodynamically stable & in NAD prior to dc.  Discussed strict return precautions and advised to return to the emergency department if experiencing any new  or worsening symptoms. Instructions were understood and patient agreed with discharge plan. Final Clinical Impressions(s) / ED Diagnoses   Final diagnoses:  Viral upper respiratory tract infection    ED Discharge Orders        Ordered    benzonatate (TESSALON) 100 MG capsule  Every 8 hours     02/24/17 1251       Gregary CromerMitchell, Xochil Shanker B, PA-C 02/24/17 1310    Jacalyn LefevreHaviland, Julie, MD 02/24/17 [phone]

## 2017-03-13 ENCOUNTER — Encounter (HOSPITAL_COMMUNITY): Payer: Self-pay

## 2017-03-13 ENCOUNTER — Other Ambulatory Visit: Payer: Self-pay

## 2017-03-13 ENCOUNTER — Emergency Department (HOSPITAL_COMMUNITY)
Admission: EM | Admit: 2017-03-13 | Discharge: 2017-03-13 | Disposition: A | Discharge status: Home / Self Care | Payer: Medicaid Other | Attending: Emergency Medicine | Admitting: Emergency Medicine

## 2017-03-13 ENCOUNTER — Emergency Department (HOSPITAL_COMMUNITY): Payer: Medicaid Other

## 2017-03-13 DIAGNOSIS — Y998 Other external cause status: Secondary | ICD-10-CM | POA: Diagnosis not present

## 2017-03-13 DIAGNOSIS — Z79899 Other long term (current) drug therapy: Secondary | ICD-10-CM | POA: Insufficient documentation

## 2017-03-13 DIAGNOSIS — W208XXA Other cause of strike by thrown, projected or falling object, initial encounter: Secondary | ICD-10-CM | POA: Insufficient documentation

## 2017-03-13 DIAGNOSIS — Y9389 Activity, other specified: Secondary | ICD-10-CM | POA: Insufficient documentation

## 2017-03-13 DIAGNOSIS — M25512 Pain in left shoulder: Secondary | ICD-10-CM | POA: Insufficient documentation

## 2017-03-13 DIAGNOSIS — Y929 Unspecified place or not applicable: Secondary | ICD-10-CM | POA: Diagnosis not present

## 2017-03-13 DIAGNOSIS — S90111A Contusion of right great toe without damage to nail, initial encounter: Secondary | ICD-10-CM

## 2017-03-13 DIAGNOSIS — F17210 Nicotine dependence, cigarettes, uncomplicated: Secondary | ICD-10-CM | POA: Diagnosis not present

## 2017-03-13 MED ORDER — ACETAMINOPHEN 500 MG PO TABS
1000.0000 mg | ORAL_TABLET | Freq: Once | ORAL | Status: AC
  Administered 2017-03-13: 1000 mg via ORAL
  Filled 2017-03-13: qty 2

## 2017-03-13 NOTE — ED Provider Notes (Signed)
[facility] EMERGENCY DEPARTMENT Provider Note   CSN: [id number] Arrival date & time: 03/13/17  1030     History   Chief Complaint Chief Complaint  Patient presents with  . Shoulder Pain  . Toe Injury    HPI Marissa Smith is a 31 y.o. female who presents with left shoulder pain and right toe pain that began yesterday.  Patient reports that she was helping a friend move when she was carrying a heavy object.  Patient reports that she was carrying a heavy object when her shoulder began to hurt, causing it to drop the object and fall on her right toe.  Patient reports that since then, she has had pain in the right shoulder.  She reports that pain is worsened with movement of the right shoulder.  Patient states that she has a history of rotator cuff injury that was diagnosed by urgent care.  She has never had orthopedic follow-up or had to have rotator cuff repair.  Patient also notes swelling to the right toe.  She states that she is still able to ambulate but reports worsening pain with ambulation.  She has been taking Tylenol and Flexeril or Robaxin for symptomatic relief.  Patient denies any fevers, numbness/weakness, redness or swelling.  The history is provided by the patient.    Past Medical History:  Diagnosis Date  . Back pain, chronic   . Cat allergies   . Depressed bipolar I disorder (HCC)   . Seasonal allergies     Patient Active Problem List   Diagnosis Date Noted  . Encounter for IUD insertion 08/14/2015  . Dysmenorrhea 10/31/2012  . Abnormal uterine bleeding (AUB) 10/31/2012    Past Surgical History:  Procedure Laterality Date  . CHOLECYSTECTOMY  2010   lap chole    OB History    Gravida Para Term Preterm AB Living   4 0 0 0 1 3   SAB TAB Ectopic Multiple Live Births   0 0 0 0 3       Home Medications    Prior to Admission medications   Medication Sig Start Date End Date Taking? Authorizing Provider  acetaminophen (TYLENOL) 500 MG  tablet Take 500-1,000 mg by mouth 2 (two) times daily as needed for moderate pain.    [provider]  benzonatate (TESSALON) 100 MG capsule Take 1 capsule (100 mg total) every 8 (eight) hours by mouth. 02/24/17   Mathews Robinsons B, PA-C  cephALEXin (KEFLEX) 500 MG capsule Take 1 capsule (500 mg total) by mouth 4 (four) times daily. 01/01/17   Hayden Rasmussen, NP  cetirizine (ZYRTEC ALLERGY) 10 MG tablet Take 1 tablet (10 mg total) by mouth daily. 06/13/16   Everlene Farrier, PA-C  dicyclomine (BENTYL) 20 MG tablet Take 1 tablet (20 mg total) by mouth every 6 (six) hours as needed for spasms. 12/23/16   Raeford Razor, MD  fluticasone (FLONASE) 50 MCG/ACT nasal spray Place 2 sprays into both nostrils daily. 12/09/16   Brock Bad, MD  ibuprofen (ADVIL,MOTRIN) 800 MG tablet Take 1 tablet (800 mg total) by mouth every 8 (eight) hours as needed for moderate pain. 10/26/16   Brock Bad, MD  loratadine (CLARITIN) 10 MG tablet Take 1 tablet (10 mg total) by mouth daily. 12/09/16   Brock Bad, MD  methocarbamol (ROBAXIN) 750 MG tablet Take 1 tablet (750 mg total) by mouth every 8 (eight) hours as needed for muscle spasms. 12/09/16   Brock Bad, MD  Multiple Vitamin (MULTIVITAMIN) tablet Take 1 tablet by mouth daily.    [provider]  Oxycodone HCl 10 MG TABS Take 1 tablet (10 mg total) by mouth every 6 (six) hours as needed (pain). 01/19/17   Brock BadHarper, Charles A, MD  zolpidem (AMBIEN) 10 MG tablet Take 10 mg by mouth at bedtime as needed for sleep.  09/23/16   [provider]    Family History Family History  Problem Relation Age of Onset  . Heart failure Father     Social History Social History   Tobacco Use  . Smoking status: Light Tobacco Smoker    Packs/day: 0.25    Types: Cigarettes  . Smokeless tobacco: Never Used  Substance Use Topics  . Alcohol use: No    Alcohol/week: 0.0 oz  . Drug use: No     Allergies   Darvocet [propoxyphene  n-acetaminophen] and Miconazole nitrate   Review of Systems Review of Systems  Constitutional: Negative for fever.  Musculoskeletal:       Left shoulder pain Right first toe pain   Skin: Negative for color change.  Neurological: Negative for weakness and numbness.     Physical Exam Updated Vital Signs BP 118/85   Pulse 87   Temp 98.8 F (37.1 C) (Oral)   Resp 16   SpO2 98%   Physical Exam  Constitutional: She appears well-developed and well-nourished.  HENT:  Head: Normocephalic and atraumatic.  Eyes: Conjunctivae and EOM are normal. Right eye exhibits no discharge. Left eye exhibits no discharge. No scleral icterus.  Cardiovascular:  Pulses:      Radial pulses are 2+ on the right side, and 2+ on the left side.       Dorsalis pedis pulses are 2+ on the right side, and 2+ on the left side.  Pulmonary/Chest: Effort normal.  Musculoskeletal:  Tenderness palpation to the left shoulder.  No deformity or crepitus noted.  No overlying warmth, erythema, swelling, ecchymosis.  Limited range of motion secondary to pain.  Limited active flexion and abduction of right shoulder but patient is able to complete passive flexion and abduction but with subjective reports of pain.  Positive Neer's impingement and Hawkins.  Positive supraspinatus test.  Patient unable to complete liftoff test.  Full range of motion of right shoulder without wheeze.  Tenderness palpation to the dorsal aspect of the right first toe.  There is some mild overlying soft tissue swelling and ecchymosis.  No deformity or crepitus noted.  Full range of motion of bilateral ankles without any difficulty.  No tenderness palpation to bilateral ankles.  Patient able to move all 5 digits of foot without difficulty.  Neurological: She is alert.  Sensation intact along major nerve distributions of BUE and BLE Equal grip strength.  Skin: Skin is warm and dry. Capillary refill takes less than 2 seconds.  Good distal cap refill of  bilateral feet.  Psychiatric: She has a normal mood and affect. Her speech is normal and behavior is normal.  Nursing note and vitals reviewed.    ED Treatments / Results  Labs (all labs ordered are listed, but only abnormal results are displayed) Labs Reviewed - No data to display  EKG  EKG Interpretation None       Radiology Dg Shoulder Left  Result Date: 03/13/2017 CLINICAL DATA:  Left shoulder pain. EXAM: LEFT SHOULDER - 2+ VIEW COMPARISON:  None. FINDINGS: There is no evidence of fracture or dislocation. There is no evidence of arthropathy or other focal  bone abnormality. Soft tissues are unremarkable. IMPRESSION: Negative. Electronically Signed   By: Obie DredgeWilliam T Derry M.D.   On: 03/13/2017 11:08   Dg Foot Complete Right  Result Date: 03/13/2017 CLINICAL DATA:  Dropped an object on her right great toe yesterday. EXAM: RIGHT FOOT COMPLETE - 3+ VIEW COMPARISON:  Right foot x-rays dated November 17, 2008. FINDINGS: There is no evidence of fracture or dislocation. There is no evidence of arthropathy or other focal bone abnormality. Plantar enthesophyte, unchanged. Soft tissues are unremarkable. IMPRESSION: Negative. Electronically Signed   By: Obie DredgeWilliam T Derry M.D.   On: 03/13/2017 11:09    Procedures Procedures (including critical care time)  Medications Ordered in ED Medications  acetaminophen (TYLENOL) tablet 1,000 mg (1,000 mg Oral Given 03/13/17 1156)     Initial Impression / Assessment and Plan / ED Course  I have reviewed the triage vital signs and the nursing notes.  Pertinent labs & imaging results that were available during my care of the patient were reviewed by me and considered in my medical decision making (see chart for details).     31 year old female who presents with 2 complaints today.  Patient reports left shoulder pain and right first toe pain.  She states that she was carrying a heavy object yesterday when her shoulder began hurting, causing her to drop the  object on her right foot.  Patient reports a history of rotator cuff injury that was diagnosed at urgent care.  Has never been evaluated by orthopedics. Patient is afebrile, non-toxic appearing, sitting comfortably on examination table. Vital signs reviewed and stable. Patient is neurovascularly intact.  Consider rotator cuff injury versus tendinitis versus ligament sprain.  Consider fracture versus dislocation versus contusion of right first toe.  X-rays ordered at triage.  Patient is driving home, therefore I told her that she cannot have any sedate of pain medication here in the department.  Patient already has muscle relaxers at home.  Patient reviewed on [place] substance database.  She has a recent prescription of oxycodone on 01/18/17.  X-rays reviewed.  Negative for any acute fracture dislocation of the shoulder.  No evidence of fracture or dislocation of the right first toe.  Discussed results with patient.  Explained to patient that left shoulder injury could be related to rotator cuff injury versus tendon versus ligament injury, which would not show up on x-ray.  Plan to provide sling for comfort.  Encouraged range of motion exercises to prevent frozen shoulder. Plan to provide outpatient orthopedic referral for further follow-up and evaluation. Patient had ample opportunity for questions and discussion. All patient's questions were answered with full understanding. Strict return precautions discussed. Patient expresses understanding and agreement to plan.    Final Clinical Impressions(s) / ED Diagnoses   Final diagnoses:  Acute pain of left shoulder  Contusion of right great toe without damage to nail, initial encounter    ED Discharge Orders    None       Rosana HoesLayden, Joury Allcorn A, PA-C 03/13/17 1222    Margarita Grizzleay, Danielle, MD 03/13/17 [phone]

## 2017-03-13 NOTE — ED Triage Notes (Signed)
patient reports that she picked up object yesterday and dropped on right great toe. Complains of left shoulder pain with same.

## 2017-03-13 NOTE — ED Notes (Signed)
Declined W/C at D/C and was escorted to lobby by RN. 

## 2017-03-13 NOTE — Discharge Instructions (Signed)
You can take Tylenol or Ibuprofen as directed for pain. You can alternate Tylenol and Ibuprofen every 4 hours. If you take Tylenol at 1pm, then you can take Ibuprofen at 5pm. Then you can take Tylenol again at 9pm.   Follow the RICE (Rest, Ice, Compression, Elevation) protocol as directed.   Wear the sling for comfort and support. As we discussed, it is very important that he take the sling off and do shoulder range of motion exercises to maintain mobility of her shoulder.  As we discussed, follow-up with referred orthopedic doctor for further evaluation.  Return to the emergency department for any worsening pain, redness or swelling of the shoulder of the foot, numbness/weakness of the shoulder or the foot, fevers or any other worsening or concerning symptoms.

## 2017-03-23 ENCOUNTER — Ambulatory Visit (INDEPENDENT_AMBULATORY_CARE_PROVIDER_SITE_OTHER): Payer: Medicaid Other

## 2017-03-23 ENCOUNTER — Encounter (HOSPITAL_COMMUNITY): Payer: Self-pay | Admitting: Emergency Medicine

## 2017-03-23 ENCOUNTER — Ambulatory Visit (HOSPITAL_COMMUNITY)
Admission: EM | Admit: 2017-03-23 | Discharge: 2017-03-23 | Disposition: A | Discharge status: Home / Self Care | Payer: Medicaid Other | Attending: Internal Medicine | Admitting: Internal Medicine

## 2017-03-23 DIAGNOSIS — R05 Cough: Secondary | ICD-10-CM | POA: Diagnosis not present

## 2017-03-23 DIAGNOSIS — J019 Acute sinusitis, unspecified: Secondary | ICD-10-CM | POA: Diagnosis not present

## 2017-03-23 DIAGNOSIS — R059 Cough, unspecified: Secondary | ICD-10-CM

## 2017-03-23 MED ORDER — AMOXICILLIN-POT CLAVULANATE 875-125 MG PO TABS: 1.0000 | ORAL_TABLET | Freq: Two times a day (BID) | ORAL | 0 refills | Status: AC

## 2017-03-23 MED ORDER — ONDANSETRON HCL 4 MG PO TABS: 4.0000 mg | ORAL_TABLET | Freq: Three times a day (TID) | ORAL | 0 refills | Status: AC | PRN

## 2017-03-23 MED ORDER — ALBUTEROL SULFATE HFA 108 (90 BASE) MCG/ACT IN AERS: 1.0000 | INHALATION_SPRAY | Freq: Four times a day (QID) | RESPIRATORY_TRACT | 0 refills | Status: DC | PRN

## 2017-03-23 MED ORDER — BENZONATATE 100 MG PO CAPS: 100.0000 mg | ORAL_CAPSULE | Freq: Three times a day (TID) | ORAL | 0 refills | Status: AC

## 2017-03-23 MED ORDER — ONDANSETRON HCL 4 MG PO TABS: 4.0000 mg | ORAL_TABLET | Freq: Three times a day (TID) | ORAL | 0 refills | Status: DC | PRN

## 2017-03-23 NOTE — Discharge Instructions (Addendum)
Chest X-Ray clear of pneumonia. Augmentin (antibiotic) sent in for sinus infection. Albuterol sent to help with cough/breathing. Use inhaler every 6 hours for 3 days then use as needed for chest tightness, shortness of breath.  Continue symptom control. I expect your symptoms to start improving in the next 1-2 weeks.   1. Take a daily allergy pill/anti-histamine like Zyrtec, Claritin, or Store brand consistently for 2 weeks  2. For congestion you Dukes try an oral decongestant like Mucinex or sudafed. You Brenning also try intranasal flonase nasal spray or saline irrigations (neti pot, sinus cleanse)  3. For your sore throat you Domingo try cepacol lozenges, salt water gargles, throat spray. Treatment of congestion Tagg also help your sore throat.  4. For cough you Schaben try Robitussen, Mucinex DM  5. Take Tylenol or Ibuprofen to help with pain/inflammation  6. Stay hydrated, drink plenty of fluids to keep throat coated and less irritated  Honey Tea Recipe: For sore throat try using a honey-based tea. Use 3 teaspoons of honey with juice squeezed from half lemon. Place shaved pieces of ginger into 1/2-1 cup of water and warm over stove top. Then mix the ingredients and repeat every 4 hours as needed.   Your nausea, vomiting, and diarrhea appear to have a viral cause. Your symptoms should improve over the next week as your body continues to rid the infectious cause.  For nausea: Zofran prescribed. Begin with every 6 hours, than as you are able to hold food down, take it as needed. Start with clear liquids, then move to plain foods like crackers, bananas, rice, applesauce, toast, broth, grits, oatmeal. As those food settle okay you Mansour transition to your normal foods. Avoid spicy and greasy foods as much as possible.  For Diarrhea: This is your body's natural way of getting rid of a virus. You Smyser try taking 1 imodium to decrease amount of stools a day, but we do not want you to stop your diarrhea.    Preventing dehydration is key! You need to replace the fluid your body is expelling. Drink plenty of fluids, Carre use Pedialyte or sports drinks.   Please return if you are experiencing blood in your vomit or stool or experiencing dizziness, lightheadedness, extreme fatigue, increased abdominal pain, symptoms persisting past 7 days.

## 2017-03-23 NOTE — ED Triage Notes (Signed)
Pt sts cough and pain with cough x 2 weeks ;pt sts emesis x 2 and pain with cough

## 2017-03-23 NOTE — ED Provider Notes (Signed)
[facility]    CSN: [id number] Arrival date & time: 03/23/17  1134     History   Chief Complaint Chief Complaint  Patient presents with  . Cough    HPI Marissa Smith is a 31 y.o. female presenting with cough. States she has been fighting a cold for 1 month. Associated symptoms of cough, congestion, sore throat, facial pressure. She has had increased fatigue, shortness of breath, difficulty breathing and chest tightness. Endorses mucous production. Has taken Claritin/Zyrtec, Flonase without relief. Also taken tessalon for her cough.   Over the past 3 days she has also developed nausea, vomiting and diarrhea. Has taken anti-diarrheal medications to slow diarrhea down. Nausea worse in the morning. Denies blood in vomit or stool. Has not vomited today. Eating soup.   HPI  Past Medical History:  Diagnosis Date  . Back pain, chronic   . Cat allergies   . Depressed bipolar I disorder (HCC)   . Seasonal allergies     Patient Active Problem List   Diagnosis Date Noted  . Encounter for IUD insertion 08/14/2015  . Dysmenorrhea 10/31/2012  . Abnormal uterine bleeding (AUB) 10/31/2012    Past Surgical History:  Procedure Laterality Date  . CHOLECYSTECTOMY  2010   lap chole    OB History    Gravida Para Term Preterm AB Living   4 0 0 0 1 3   SAB TAB Ectopic Multiple Live Births   0 0 0 0 3       Home Medications    Prior to Admission medications   Medication Sig Start Date End Date Taking? Authorizing Provider  acetaminophen (TYLENOL) 500 MG tablet Take 500-1,000 mg by mouth 2 (two) times daily as needed for moderate pain.    [provider]  albuterol (PROVENTIL HFA;VENTOLIN HFA) 108 (90 Base) MCG/ACT inhaler Inhale 1-2 puffs into the lungs every 6 (six) hours as needed for wheezing or shortness of breath. 03/23/17   Wieters, Hallie C, PA-C  amoxicillin-clavulanate (AUGMENTIN) 875-125 MG tablet Take 1 tablet by mouth every 12 (twelve) hours for 10  days. 03/23/17 04/02/17  Wieters, Hallie C, PA-C  benzonatate (TESSALON) 100 MG capsule Take 1 capsule (100 mg total) by mouth every 8 (eight) hours for 5 days. 03/23/17 03/28/17  Wieters, Hallie C, PA-C  cephALEXin (KEFLEX) 500 MG capsule Take 1 capsule (500 mg total) by mouth 4 (four) times daily. 01/01/17   Hayden RasmussenMabe, David, NP  cetirizine (ZYRTEC ALLERGY) 10 MG tablet Take 1 tablet (10 mg total) by mouth daily. 06/13/16   Everlene Farrieransie, William, PA-C  dicyclomine (BENTYL) 20 MG tablet Take 1 tablet (20 mg total) by mouth every 6 (six) hours as needed for spasms. 12/23/16   Raeford RazorKohut, Stephen, MD  fluticasone (FLONASE) 50 MCG/ACT nasal spray Place 2 sprays into both nostrils daily. 12/09/16   Brock BadHarper, Charles A, MD  ibuprofen (ADVIL,MOTRIN) 800 MG tablet Take 1 tablet (800 mg total) by mouth every 8 (eight) hours as needed for moderate pain. 10/26/16   Brock BadHarper, Charles A, MD  loratadine (CLARITIN) 10 MG tablet Take 1 tablet (10 mg total) by mouth daily. 12/09/16   Brock BadHarper, Charles A, MD  methocarbamol (ROBAXIN) 750 MG tablet Take 1 tablet (750 mg total) by mouth every 8 (eight) hours as needed for muscle spasms. 12/09/16   Brock BadHarper, Charles A, MD  Multiple Vitamin (MULTIVITAMIN) tablet Take 1 tablet by mouth daily.    [provider]  ondansetron (ZOFRAN) 4 MG tablet Take 1 tablet (4  mg total) by mouth every 8 (eight) hours as needed for up to 5 days for nausea or vomiting. 03/23/17 03/28/17  Wieters, Hallie C, PA-C  Oxycodone HCl 10 MG TABS Take 1 tablet (10 mg total) by mouth every 6 (six) hours as needed (pain). 01/19/17   Brock Bad, MD  zolpidem (AMBIEN) 10 MG tablet Take 10 mg by mouth at bedtime as needed for sleep.  09/23/16   [provider]    Family History Family History  Problem Relation Age of Onset  . Heart failure Father     Social History Social History   Tobacco Use  . Smoking status: Light Tobacco Smoker    Packs/day: 0.25    Types: Cigarettes  . Smokeless tobacco: Never  Used  Substance Use Topics  . Alcohol use: No    Alcohol/week: 0.0 oz  . Drug use: No     Allergies   Darvocet [propoxyphene n-acetaminophen] and Miconazole nitrate   Review of Systems Review of Systems  Constitutional: Positive for fatigue. Negative for chills and fever.  HENT: Positive for congestion, sinus pressure, sinus pain and sore throat. Negative for ear pain.   Eyes: Negative for pain and visual disturbance.  Respiratory: Positive for cough, chest tightness and shortness of breath.   Cardiovascular: Negative for chest pain and palpitations.  Gastrointestinal: Positive for diarrhea, nausea and vomiting. Negative for abdominal pain and blood in stool.       Denies hematemesis  Genitourinary: Negative for dysuria and hematuria.  Musculoskeletal: Positive for myalgias. Negative for arthralgias and back pain.  Skin: Negative for color change and rash.  Neurological: Negative for dizziness, light-headedness and headaches.  All other systems reviewed and are negative.    Physical Exam Triage Vital Signs ED Triage Vitals [03/23/17 1201]  Enc Vitals Group     BP 121/71     Pulse Rate 67     Resp 18     Temp 98.3 F (36.8 C)     Temp Source Oral     SpO2 100 %     Weight      Height      Head Circumference      Peak Flow      Pain Score      Pain Loc      Pain Edu?      Excl. in GC?    No data found.  Updated Vital Signs BP 121/71 (BP Location: Right Arm)   Pulse 67   Temp 98.3 F (36.8 C) (Oral)   Resp 18   SpO2 100%   Visual Acuity Right Eye Distance:   Left Eye Distance:   Bilateral Distance:    Right Eye Near:   Left Eye Near:    Bilateral Near:     Physical Exam  Constitutional: She appears well-developed and well-nourished. No distress.  HENT:  Head: Normocephalic and atraumatic.  Right Ear: External ear normal.  Left Ear: External ear normal.  Mouth/Throat: Uvula is midline and mucous membranes are normal. No uvula swelling. Posterior  oropharyngeal erythema present. No oropharyngeal exudate. Tonsils are 1+ on the right. Tonsils are 0 on the left. No tonsillar exudate.  Eyes: Conjunctivae are normal.  Neck: Normal range of motion. Neck supple.  Cardiovascular: Normal rate and regular rhythm.  No murmur heard. Pulmonary/Chest: Effort normal and breath sounds normal. No respiratory distress. She has no wheezes. She has no rales.  Abdominal: Soft. She exhibits no distension. There is tenderness. There is no  rebound and no guarding.  Mild tenderness to palpation of epigastrum and LUQ  Musculoskeletal: She exhibits no edema.  Neurological: She is alert.  Skin: Skin is warm and dry.  Psychiatric: She has a normal mood and affect.  Nursing note and vitals reviewed.    UC Treatments / Results  Labs (all labs ordered are listed, but only abnormal results are displayed) Labs Reviewed - No data to display  EKG  EKG Interpretation None       Radiology Dg Chest 2 View  Result Date: 03/23/2017 CLINICAL DATA:  Productive cough. EXAM: CHEST  2 VIEW COMPARISON:  None. FINDINGS: The heart size and mediastinal contours are within normal limits. Both lungs are clear. No pneumothorax or pleural effusion is noted. The visualized skeletal structures are unremarkable. IMPRESSION: No active cardiopulmonary disease. Electronically Signed   By: Lupita RaiderJames  Green Jr, M.D.   On: 03/23/2017 13:48    Procedures Procedures (including critical care time)  Medications Ordered in UC Medications - No data to display   Initial Impression / Assessment and Plan / UC Course  I have reviewed the triage vital signs and the nursing notes.  Pertinent labs & imaging results that were available during my care of the patient were reviewed by me and considered in my medical decision making (see chart for details).     CXR without evidence of pneumonia. Patient presents with symptoms likely from sinusitis, bacterial vs viral. Differential includes  bacterial pneumonia, sinusitis, allergic rhinitis, acute bronchitis. Do not suspect underlying cardiopulmonary process. Symptoms seem unlikely related to ACS, CHF or COPD exacerbations, pneumonia, pneumothorax. Patient is nontoxic appearing and not in need of emergent medical intervention.  Augmentin prescribed 10 day course, albuterol inhaler given for shortness of breath, chest tightness. Tessalon for cough. Zofran for nausea.  Recommended symptom control also with over the counter medications: Daily oral anti-histamine, Oral decongestant or IN corticosteroid, saline irrigations, cepacol lozenges, Robitussin, Delsym, honey tea.  Return if symptoms fail to improve in 1-2 weeks or you develop shortness of breath, chest pain, severe headache. Patient states understanding and is agreeable.  Discharged with PCP followup.  Treat GI symptoms as viral with symptomatic treatment. Zofran, Hydration.   Please return if you are experiencing blood in your vomit or stool or experiencing dizziness, lightheadedness, extreme fatigue, increased abdominal pain, symptoms persisting past 7 days.   Final Clinical Impressions(s) / UC Diagnoses   Final diagnoses:  Cough  Acute sinusitis with symptoms > 10 days    ED Discharge Orders        Ordered    ondansetron (ZOFRAN) 4 MG tablet  Every 8 hours PRN,   Status:  Discontinued     03/23/17 1333    amoxicillin-clavulanate (AUGMENTIN) 875-125 MG tablet  Every 12 hours     03/23/17 1352    albuterol (PROVENTIL HFA;VENTOLIN HFA) 108 (90 Base) MCG/ACT inhaler  Every 6 hours PRN     03/23/17 1352    ondansetron (ZOFRAN) 4 MG tablet  Every 8 hours PRN     03/23/17 1359    benzonatate (TESSALON) 100 MG capsule  Every 8 hours     03/23/17 1359       Controlled Substance Prescriptions [place] Controlled Substance Registry consulted? Not Applicable   Lew DawesWieters, Hallie C, [place] JerseyPA-C 03/23/17 1414

## 2017-05-06 ENCOUNTER — Ambulatory Visit (INDEPENDENT_AMBULATORY_CARE_PROVIDER_SITE_OTHER): Payer: Medicaid Other | Admitting: Obstetrics

## 2017-05-06 ENCOUNTER — Encounter: Payer: Self-pay | Admitting: Obstetrics

## 2017-05-06 ENCOUNTER — Other Ambulatory Visit (HOSPITAL_COMMUNITY)
Admission: RE | Admit: 2017-05-06 | Discharge: 2017-05-06 | Disposition: A | Discharge status: Home / Self Care | Payer: Medicaid Other | Source: Ambulatory Visit | Attending: Obstetrics | Admitting: Obstetrics

## 2017-05-06 VITALS — BP 124/70 | HR 78 | Wt 181.0 lb

## 2017-05-06 DIAGNOSIS — Z9049 Acquired absence of other specified parts of digestive tract: Secondary | ICD-10-CM | POA: Diagnosis not present

## 2017-05-06 DIAGNOSIS — Z79899 Other long term (current) drug therapy: Secondary | ICD-10-CM | POA: Insufficient documentation

## 2017-05-06 DIAGNOSIS — Z888 Allergy status to other drugs, medicaments and biological substances status: Secondary | ICD-10-CM | POA: Insufficient documentation

## 2017-05-06 DIAGNOSIS — Z8249 Family history of ischemic heart disease and other diseases of the circulatory system: Secondary | ICD-10-CM | POA: Insufficient documentation

## 2017-05-06 DIAGNOSIS — F17210 Nicotine dependence, cigarettes, uncomplicated: Secondary | ICD-10-CM | POA: Diagnosis not present

## 2017-05-06 DIAGNOSIS — N939 Abnormal uterine and vaginal bleeding, unspecified: Secondary | ICD-10-CM | POA: Insufficient documentation

## 2017-05-06 DIAGNOSIS — N946 Dysmenorrhea, unspecified: Secondary | ICD-10-CM

## 2017-05-06 DIAGNOSIS — Z7951 Long term (current) use of inhaled steroids: Secondary | ICD-10-CM | POA: Diagnosis not present

## 2017-05-06 DIAGNOSIS — Z0000 Encounter for general adult medical examination without abnormal findings: Secondary | ICD-10-CM

## 2017-05-06 MED ORDER — OXYCODONE HCL 10 MG PO TABS: 10.0000 mg | ORAL_TABLET | Freq: Four times a day (QID) | ORAL | 0 refills | Status: DC | PRN

## 2017-05-06 MED ORDER — CITRANATAL HARMONY 27-1-260 MG PO CAPS: 1.0000 | ORAL_CAPSULE | Freq: Every day | ORAL | 11 refills | Status: DC

## 2017-05-06 NOTE — Progress Notes (Signed)
Subjective:    Marissa Smith is a 32 y.o. female who presents for contraception counseling. The patient has no complaints today. The patient is sexually active. Pertinent past medical history: current smoker.  The information documented in the HPI was reviewed and verified.  Menstrual History: OB History    Gravida Para Term Preterm AB Living   4 0 0 0 1 3   SAB TAB Ectopic Multiple Live Births   0 0 0 0 3        Patient's last menstrual period was 05/01/2017 (exact date).   Patient Active Problem List   Diagnosis Date Noted  . Encounter for IUD insertion 08/14/2015  . Dysmenorrhea 10/31/2012  . Abnormal uterine bleeding (AUB) 10/31/2012   Past Medical History:  Diagnosis Date  . Back pain, chronic   . Cat allergies   . Depressed bipolar I disorder (HCC)   . Seasonal allergies     Past Surgical History:  Procedure Laterality Date  . CHOLECYSTECTOMY  2010   lap chole     Current Outpatient Medications:  .  acetaminophen (TYLENOL) 500 MG tablet, Take 500-1,000 mg by mouth 2 (two) times daily as needed for moderate pain., Disp: , Rfl:  .  albuterol (PROVENTIL HFA;VENTOLIN HFA) 108 (90 Base) MCG/ACT inhaler, Inhale 1-2 puffs into the lungs every 6 (six) hours as needed for wheezing or shortness of breath., Disp: 1 Inhaler, Rfl: 0 .  cephALEXin (KEFLEX) 500 MG capsule, Take 1 capsule (500 mg total) by mouth 4 (four) times daily., Disp: 28 capsule, Rfl: 0 .  cetirizine (ZYRTEC ALLERGY) 10 MG tablet, Take 1 tablet (10 mg total) by mouth daily., Disp: 30 tablet, Rfl: 1 .  dicyclomine (BENTYL) 20 MG tablet, Take 1 tablet (20 mg total) by mouth every 6 (six) hours as needed for spasms., Disp: 12 tablet, Rfl: 0 .  fluticasone (FLONASE) 50 MCG/ACT nasal spray, Place 2 sprays into both nostrils daily., Disp: 16 g, Rfl: 5 .  ibuprofen (ADVIL,MOTRIN) 800 MG tablet, Take 1 tablet (800 mg total) by mouth every 8 (eight) hours as needed for moderate pain., Disp: 30 tablet, Rfl: 5 .   loratadine (CLARITIN) 10 MG tablet, Take 1 tablet (10 mg total) by mouth daily., Disp: 30 tablet, Rfl: 11 .  methocarbamol (ROBAXIN) 750 MG tablet, Take 1 tablet (750 mg total) by mouth every 8 (eight) hours as needed for muscle spasms., Disp: 42 tablet, Rfl: 5 .  Multiple Vitamin (MULTIVITAMIN) tablet, Take 1 tablet by mouth daily., Disp: , Rfl:  .  Oxycodone HCl 10 MG TABS, Take 1 tablet (10 mg total) by mouth every 6 (six) hours as needed (pain)., Disp: 20 tablet, Rfl: 0 .  Prenat-FeFmCb-DSS-FA-DHA w/o A (CITRANATAL HARMONY) 27-1-260 MG CAPS, Take 1 capsule by mouth daily before breakfast., Disp: 30 capsule, Rfl: 11 .  zolpidem (AMBIEN) 10 MG tablet, Take 10 mg by mouth at bedtime as needed for sleep. , Disp: , Rfl: 3  Current Facility-Administered Medications:  .  etonogestrel (NEXPLANON) implant 68 mg, 68 mg, Subdermal, Once, Brock BadHarper, Charles A, MD Allergies  Allergen Reactions  . Darvocet [Propoxyphene N-Acetaminophen] Nausea And Vomiting and Other (See Comments)    Reaction:  Dizziness   . Miconazole Nitrate Swelling and Rash    Social History   Tobacco Use  . Smoking status: Light Tobacco Smoker    Packs/day: 0.25    Types: Cigarettes  . Smokeless tobacco: Never Used  Substance Use Topics  . Alcohol use: No  Alcohol/week: 0.0 oz    Family History  Problem Relation Age of Onset  . Heart failure Father        Review of Systems Constitutional: negative for weight loss Genitourinary:POSITIVE for abnormal menstrual periods    Objective:   BP 124/70   Pulse 78   Wt 181 lb (82.1 kg)   LMP 05/01/2017 (Exact Date)   BMI 31.07 kg/m    General:   alert  Skin:   no rash or abnormalities  Lungs:   clear to auscultation bilaterally  Heart:   regular rate and rhythm, S1, S2 normal, no murmur, click, rub or gallop  Breasts:   normal without suspicious masses, skin or nipple changes or axillary nodes  Abdomen:  normal findings: no organomegaly, soft, non-tender and no  hernia  Pelvis:  External genitalia: normal general appearance Urinary system: urethral meatus normal and bladder without fullness, nontender Vaginal: normal without tenderness, induration or masses Cervix: normal appearance Adnexa: normal bimanual exam Uterus: anteverted and non-tender, normal size   Lab Review Urine pregnancy test Labs reviewed yes Radiologic studies reviewed no  50% of 15 min visit spent on counseling and coordination of care.    Assessment:    32 y.o., continuing Nexplanon, no contraindications.   1. Abnormal uterine bleeding (AUB) - NEXPLANON BREAK-IN  2. Dysmenorrhea Rx: - Cervicovaginal ancillary only - Oxycodone HCl 10 MG TABS; Take 1 tablet (10 mg total) by mouth every 6 (six) hours as needed (pain).  Dispense: 20 tablet; Refill: 0 - continue Ibuprofen prn  3. Routine adult health maintenance Rx: - Prenat-FeFmCb-DSS-FA-DHA w/o A (CITRANATAL HARMONY) 27-1-260 MG CAPS; Take 1 capsule by mouth daily before breakfast.  Dispense: 30 capsule; Refill: 11  Plan:    All questions answered. Chlamydia specimen. GC specimen. Wet prep.    Meds ordered this encounter  Medications  . Oxycodone HCl 10 MG TABS    Sig: Take 1 tablet (10 mg total) by mouth every 6 (six) hours as needed (pain).    Dispense:  20 tablet    Refill:  0  . Prenat-FeFmCb-DSS-FA-DHA w/o A (CITRANATAL HARMONY) 27-1-260 MG CAPS    Sig: Take 1 capsule by mouth daily before breakfast.    Dispense:  30 capsule    Refill:  11   No orders of the defined types were placed in this encounter.     Brock Bad MD

## 2017-05-06 NOTE — Progress Notes (Signed)
Presents for constant headache x 1 week 7/10, cramps 8/10, irregular bleeding, NV and chills.

## 2017-05-07 LAB — CERVICOVAGINAL ANCILLARY ONLY
Candida vaginitis: NEGATIVE
Chlamydia: NEGATIVE
Neisseria Gonorrhea: NEGATIVE
Trichomonas: NEGATIVE

## 2017-05-10 ENCOUNTER — Encounter (HOSPITAL_COMMUNITY): Payer: Self-pay | Admitting: Family Medicine

## 2017-05-10 ENCOUNTER — Emergency Department (HOSPITAL_COMMUNITY): Payer: Medicaid Other

## 2017-05-10 ENCOUNTER — Emergency Department (HOSPITAL_COMMUNITY)
Admission: EM | Admit: 2017-05-10 | Discharge: 2017-05-11 | Disposition: A | Discharge status: Home / Self Care | Payer: Medicaid Other | Attending: Emergency Medicine | Admitting: Emergency Medicine

## 2017-05-10 DIAGNOSIS — F17210 Nicotine dependence, cigarettes, uncomplicated: Secondary | ICD-10-CM | POA: Diagnosis not present

## 2017-05-10 DIAGNOSIS — J4531 Mild persistent asthma with (acute) exacerbation: Secondary | ICD-10-CM | POA: Insufficient documentation

## 2017-05-10 DIAGNOSIS — Z79899 Other long term (current) drug therapy: Secondary | ICD-10-CM | POA: Diagnosis not present

## 2017-05-10 DIAGNOSIS — F410 Panic disorder [episodic paroxysmal anxiety] without agoraphobia: Secondary | ICD-10-CM | POA: Diagnosis not present

## 2017-05-10 DIAGNOSIS — J4521 Mild intermittent asthma with (acute) exacerbation: Secondary | ICD-10-CM

## 2017-05-10 LAB — CBC
HCT: 40.9 % (ref 36.0–46.0)
Hemoglobin: 13.9 g/dL (ref 12.0–15.0)
MCH: 32.6 pg (ref 26.0–34.0)
MCHC: 34 g/dL (ref 30.0–36.0)
MCV: 96 fL (ref 78.0–100.0)
Platelets: 272 10*3/uL (ref 150–400)
RBC: 4.26 MIL/uL (ref 3.87–5.11)
RDW: 13.4 % (ref 11.5–15.5)
WBC: 6.2 10*3/uL (ref 4.0–10.5)

## 2017-05-10 LAB — BASIC METABOLIC PANEL
Anion gap: 5 (ref 5–15)
BUN: 14 mg/dL (ref 6–20)
CO2: 30 mmol/L (ref 22–32)
Calcium: 9.5 mg/dL (ref 8.9–10.3)
Chloride: 106 mmol/L (ref 101–111)
Creatinine, Ser: 0.68 mg/dL (ref 0.44–1.00)
GFR calc Af Amer: >60 mL/min (ref >60)
GFR calc non Af Amer: >60 mL/min (ref >60)
Glucose, Bld: 97 mg/dL (ref 65–99)
Potassium: 3.9 mmol/L (ref 3.5–5.1)
Sodium: 141 mmol/L (ref 135–145)

## 2017-05-10 LAB — I-STAT BETA HCG BLOOD, ED (MC, WL, AP ONLY): I-stat hCG, quantitative: <5 m[IU]/mL (ref <5)

## 2017-05-10 LAB — I-STAT TROPONIN, ED: Troponin i, poc: 0.01 ng/mL (ref 0.00–0.08)

## 2017-05-10 MED ORDER — ALBUTEROL SULFATE HFA 108 (90 BASE) MCG/ACT IN AERS
1.0000 | INHALATION_SPRAY | Freq: Four times a day (QID) | RESPIRATORY_TRACT | Status: DC | PRN
  Administered 2017-05-10: 2 via RESPIRATORY_TRACT
  Filled 2017-05-10: qty 6.7

## 2017-05-10 MED ORDER — PREDNISONE 50 MG PO TABS: 50.0000 mg | ORAL_TABLET | Freq: Every day | ORAL | 0 refills | Status: DC

## 2017-05-10 NOTE — ED Triage Notes (Signed)
Patient reports she started experiencing a panic attack and left sided chest pain while driving. Also, reports she felt numbness starting in the forehead and goes down her face. Patient reports she has had the same symptoms before and it was related to panic attacks. Patient is alert, oriented x 4, but appears anxious. Patient reports she had some stress from court mediatation and her daughter going to an emergency department.

## 2017-05-10 NOTE — Discharge Instructions (Signed)
Follow-up with your mental health provider and a primary care doctor, use the inhaler and steroids as prescribed, return as needed for worsening symptoms

## 2017-05-10 NOTE — ED Provider Notes (Signed)
[facility]-EMERGENCY DEPT Provider Note   CSN: [id number] Arrival date & time: 05/10/17  1527     History   Chief Complaint Chief Complaint  Patient presents with  . Chest Pain  . Panic Attack    HPI Marissa Smith is a 32 y.o. female.  HPI Pt started having an episode where she began having numbness going from her head down.  She then starts feeling short of breath.  She starts having pain in her chest.  The sx lasted for several hours.  She was seen before and was told it was panic attacks.   She had one in the summer, one in [place] and one tonight. Past Medical History:  Diagnosis Date  . Back pain, chronic   . Cat allergies   . Depressed bipolar I disorder (HCC)   . Seasonal allergies     Patient Active Problem List   Diagnosis Date Noted  . Encounter for IUD insertion 08/14/2015  . Dysmenorrhea 10/31/2012  . Abnormal uterine bleeding (AUB) 10/31/2012    Past Surgical History:  Procedure Laterality Date  . CHOLECYSTECTOMY  2010   lap chole    OB History    Gravida Para Term Preterm AB Living   4 0 0 0 1 3   SAB TAB Ectopic Multiple Live Births   0 0 0 0 3       Home Medications    Prior to Admission medications   Medication Sig Start Date End Date Taking? Authorizing Provider  acetaminophen (TYLENOL) 500 MG tablet Take 500-1,000 mg by mouth 2 (two) times daily as needed for moderate pain.    [provider]  albuterol (PROVENTIL HFA;VENTOLIN HFA) 108 (90 Base) MCG/ACT inhaler Inhale 1-2 puffs into the lungs every 6 (six) hours as needed for wheezing or shortness of breath. 03/23/17   Wieters, Hallie C, PA-C  cephALEXin (KEFLEX) 500 MG capsule Take 1 capsule (500 mg total) by mouth 4 (four) times daily. 01/01/17   Hayden Rasmussen, NP  cetirizine (ZYRTEC ALLERGY) 10 MG tablet Take 1 tablet (10 mg total) by mouth daily. 06/13/16   Everlene Farrier, PA-C  dicyclomine (BENTYL) 20 MG tablet Take 1 tablet (20 mg total) by mouth every 6  (six) hours as needed for spasms. 12/23/16   Raeford Razor, MD  fluticasone (FLONASE) 50 MCG/ACT nasal spray Place 2 sprays into both nostrils daily. 12/09/16   Brock Bad, MD  ibuprofen (ADVIL,MOTRIN) 800 MG tablet Take 1 tablet (800 mg total) by mouth every 8 (eight) hours as needed for moderate pain. 10/26/16   Brock Bad, MD  loratadine (CLARITIN) 10 MG tablet Take 1 tablet (10 mg total) by mouth daily. 12/09/16   Brock Bad, MD  methocarbamol (ROBAXIN) 750 MG tablet Take 1 tablet (750 mg total) by mouth every 8 (eight) hours as needed for muscle spasms. 12/09/16   Brock Bad, MD  Multiple Vitamin (MULTIVITAMIN) tablet Take 1 tablet by mouth daily.    [provider]  Oxycodone HCl 10 MG TABS Take 1 tablet (10 mg total) by mouth every 6 (six) hours as needed (pain). 05/06/17   Brock Bad, MD  predniSONE (DELTASONE) 50 MG tablet Take 1 tablet (50 mg total) by mouth daily. 05/10/17   Linwood Dibbles, MD  Prenat-FeFmCb-DSS-FA-DHA w/o A (CITRANATAL HARMONY) 27-1-260 MG CAPS Take 1 capsule by mouth daily before breakfast. 05/06/17   Brock Bad, MD  zolpidem (AMBIEN) 10 MG tablet Take 10 mg by  mouth at bedtime as needed for sleep.  09/23/16   [provider]    Family History Family History  Problem Relation Age of Onset  . Heart failure Father     Social History Social History   Tobacco Use  . Smoking status: Light Tobacco Smoker    Packs/day: 0.25    Types: Cigarettes  . Smokeless tobacco: Never Used  Substance Use Topics  . Alcohol use: Yes    Alcohol/week: 0.0 oz    Comment: Once a month  . Drug use: No     Allergies   Darvocet [propoxyphene n-acetaminophen] and Miconazole nitrate   Review of Systems Review of Systems  All other systems reviewed and are negative.    Physical Exam Updated Vital Signs BP (!) 101/48 (BP Location: Right Arm)   Pulse (!) 57   Temp 98.2 F (36.8 C) (Oral)   Resp 18   Ht 1.651 m (5\' 5" )    Wt 82.1 kg (181 lb)   LMP 05/01/2017 (Exact Date)   SpO2 95%   BMI 30.12 kg/m   Physical Exam  Constitutional: She appears well-developed and well-nourished. No distress.  HENT:  Head: Normocephalic and atraumatic.  Right Ear: External ear normal.  Left Ear: External ear normal.  Eyes: Conjunctivae are normal. Right eye exhibits no discharge. Left eye exhibits no discharge. No scleral icterus.  Neck: Neck supple. No tracheal deviation present.  Cardiovascular: Normal rate, regular rhythm and intact distal pulses.  Pulmonary/Chest: Effort normal. No stridor. No respiratory distress. She has wheezes. She has no rales.  Abdominal: Soft. Bowel sounds are normal. She exhibits no distension. There is no tenderness. There is no rebound and no guarding.  Musculoskeletal: She exhibits no edema or tenderness.  Neurological: She is alert. She has normal strength. No cranial nerve deficit (no facial droop, extraocular movements intact, no slurred speech) or sensory deficit. She exhibits normal muscle tone. She displays no seizure activity. Coordination normal.  Skin: Skin is warm and dry. No rash noted.  Psychiatric: She has a normal mood and affect.  Nursing note and vitals reviewed.    ED Treatments / Results  Labs (all labs ordered are listed, but only abnormal results are displayed) Labs Reviewed  BASIC METABOLIC PANEL  CBC  I-STAT TROPONIN, ED  I-STAT BETA HCG BLOOD, ED ([facility], [facility], [facility] ONLY)    EKG  EKG Interpretation  Date/Time:  Monday May 10 2017 15:33:17 EST Ventricular Rate:  90 PR Interval:    QRS Duration: 102 QT Interval:  353 QTC Calculation: 432 R Axis:   39 Text Interpretation:  Sinus rhythm Atrial premature complex Short PR interval No significant change since last tracing Confirmed by Linwood DibblesKnapp, Azarel Banner [phone]([id number]) on 05/10/2017 10:59:25 PM       Radiology Dg Chest 2 View  Result Date: 05/10/2017 CLINICAL DATA:  Chest pain EXAM: CHEST  2 VIEW COMPARISON:  03/23/2017  FINDINGS: Heart and mediastinal contours are within normal limits. No focal opacities or effusions. No acute bony abnormality. IMPRESSION: No active cardiopulmonary disease. Electronically Signed   By: Charlett NoseKevin  Dover M.D.   On: 05/10/2017 16:17    Procedures Procedures (including critical care time)  Medications Ordered in ED Medications  albuterol (PROVENTIL HFA;VENTOLIN HFA) 108 (90 Base) MCG/ACT inhaler 1-2 puff (not administered)     Initial Impression / Assessment and Plan / ED Course  I have reviewed the triage vital signs and the nursing notes.  Pertinent labs & imaging results that were available during my  care of the patient were reviewed by me and considered in my medical decision making (see chart for details).   Patient presented to the emergency room with episode of what sounds like a panic attack.  Her symptoms resolved while she was waiting to be evaluated.  X-rays and laboratory tests are normal.  EKG is reassuring.  On exam the patient had some mild wheezing.  I do not think this was a source of her symptoms however.  I will give her an albuterol inhaler and a few days of steroids.  Patient states she does not want to take the steroids longer than for a few days because generally it starts to exacerbate her bipolar disorder.  Final Clinical Impressions(s) / ED Diagnoses   Final diagnoses:  Panic attack  Mild intermittent asthma with acute exacerbation    ED Discharge Orders        Ordered    predniSONE (DELTASONE) 50 MG tablet  Daily     05/10/17 2318       Linwood Dibbles, MD 05/10/17 2320

## 2017-06-21 ENCOUNTER — Telehealth: Payer: Self-pay | Admitting: *Deleted

## 2017-06-21 NOTE — Telephone Encounter (Signed)
Pt called to office asking for refill on Oxycodone for cycle pain.  Made aware will review with Dr Clearance CootsHarper and she will get return call.  Please advise on refill.

## 2017-06-22 NOTE — Telephone Encounter (Signed)
Spoke with pt re: Rx Pt has been scheduled to come in for appt

## 2017-06-22 NOTE — Telephone Encounter (Signed)
Cannot refill Oxycodone.

## 2017-06-24 ENCOUNTER — Encounter: Payer: Self-pay | Admitting: Obstetrics

## 2017-06-24 ENCOUNTER — Ambulatory Visit: Payer: Medicaid Other | Admitting: Obstetrics

## 2017-06-24 VITALS — BP 127/78 | HR 91 | Wt 178.0 lb

## 2017-06-24 DIAGNOSIS — N939 Abnormal uterine and vaginal bleeding, unspecified: Secondary | ICD-10-CM | POA: Diagnosis not present

## 2017-06-24 DIAGNOSIS — N946 Dysmenorrhea, unspecified: Secondary | ICD-10-CM

## 2017-06-24 MED ORDER — KETOROLAC TROMETHAMINE 60 MG/2ML IM SOLN
60.0000 mg | Freq: Once | INTRAMUSCULAR | Status: AC
  Administered 2017-06-24: 60 mg via INTRAMUSCULAR

## 2017-06-24 MED ORDER — PROMETHAZINE HCL 25 MG PO TABS: 25.0000 mg | ORAL_TABLET | Freq: Four times a day (QID) | ORAL | 1 refills | Status: DC | PRN

## 2017-06-24 MED ORDER — NORETHINDRONE ACETATE 5 MG PO TABS: 5.0000 mg | ORAL_TABLET | Freq: Every day | ORAL | 11 refills | Status: DC

## 2017-06-24 MED ORDER — OXYCODONE HCL 10 MG PO TABS: 10.0000 mg | ORAL_TABLET | Freq: Four times a day (QID) | ORAL | 0 refills | Status: DC | PRN

## 2017-06-24 NOTE — Progress Notes (Signed)
Patient ID: Marissa Smith Reichow, female   DOB: [birth year], 32 y.Smith.   MRN: [id number]  Chief Complaint  Patient presents with  . Gynecologic Exam    AUB    HPI Marissa Smith Daleo is a 32 y.Smith. female.  Severe cramping and nausea with period.  Has had Nexplanon since October 2018.  Had had AUB since insertion of Nexplanon.  She had Mirena IUD in the past and did well.  Mirena was replaced with Liletta, and she had pelvic pain, and it was removed. HPI  Past Medical History:  Diagnosis Date  . Back pain, chronic   . Cat allergies   . Depressed bipolar I disorder (HCC)   . Seasonal allergies     Past Surgical History:  Procedure Laterality Date  . CHOLECYSTECTOMY  2010   lap chole    Family History  Problem Relation Age of Onset  . Heart failure Father     Social History Social History   Tobacco Use  . Smoking status: Light Tobacco Smoker    Packs/day: 0.25    Types: Cigarettes  . Smokeless tobacco: Never Used  Substance Use Topics  . Alcohol use: Yes    Alcohol/week: 0.0 oz    Comment: Once a month  . Drug use: No    Allergies  Allergen Reactions  . Darvocet [Propoxyphene N-Acetaminophen] Nausea And Vomiting and Other (See Comments)    Reaction:  Dizziness   . Miconazole Nitrate Swelling and Rash    Current Outpatient Medications  Medication Sig Dispense Refill  . acetaminophen (TYLENOL) 500 MG tablet Take 500-1,000 mg by mouth 2 (two) times daily as needed for moderate pain.    Marland Kitchen. albuterol (PROVENTIL HFA;VENTOLIN HFA) 108 (90 Base) MCG/ACT inhaler Inhale 1-2 puffs into the lungs every 6 (six) hours as needed for wheezing or shortness of breath. 1 Inhaler 0  . cephALEXin (KEFLEX) 500 MG capsule Take 1 capsule (500 mg total) by mouth 4 (four) times daily. (Patient not taking: Reported on 06/24/2017) 28 capsule 0  . cetirizine (ZYRTEC ALLERGY) 10 MG tablet Take 1 tablet (10 mg total) by mouth daily. 30 tablet 1  . dicyclomine (BENTYL) 20 MG tablet Take 1 tablet (20 mg total) by  mouth every 6 (six) hours as needed for spasms. (Patient not taking: Reported on 06/24/2017) 12 tablet 0  . fluticasone (FLONASE) 50 MCG/ACT nasal spray Place 2 sprays into both nostrils daily. 16 g 5  . ibuprofen (ADVIL,MOTRIN) 800 MG tablet Take 1 tablet (800 mg total) by mouth every 8 (eight) hours as needed for moderate pain. 30 tablet 5  . loratadine (CLARITIN) 10 MG tablet Take 1 tablet (10 mg total) by mouth daily. 30 tablet 11  . methocarbamol (ROBAXIN) 750 MG tablet Take 1 tablet (750 mg total) by mouth every 8 (eight) hours as needed for muscle spasms. 42 tablet 5  . Multiple Vitamin (MULTIVITAMIN) tablet Take 1 tablet by mouth daily.    . norethindrone (AYGESTIN) 5 MG tablet Take 1 tablet (5 mg total) by mouth daily. 30 tablet 11  . Oxycodone HCl 10 MG TABS Take 1 tablet (10 mg total) by mouth every 6 (six) hours as needed (pain). 20 tablet 0  . predniSONE (DELTASONE) 50 MG tablet Take 1 tablet (50 mg total) by mouth daily. (Patient not taking: Reported on 06/24/2017) 3 tablet 0  . Prenat-FeFmCb-DSS-FA-DHA w/Smith A (CITRANATAL HARMONY) 27-1-260 MG CAPS Take 1 capsule by mouth daily before breakfast. 30 capsule 11  . promethazine (PHENERGAN) 25  MG tablet Take 1 tablet (25 mg total) by mouth every 6 (six) hours as needed for nausea or vomiting. 30 tablet 1  . zolpidem (AMBIEN) 10 MG tablet Take 10 mg by mouth at bedtime as needed for sleep.   3   Current Facility-Administered Medications  Medication Dose Route Frequency Provider Last Rate Last Dose  . etonogestrel (NEXPLANON) implant 68 mg  68 mg Subdermal Once Brock Bad, MD        Review of Systems Review of Systems Constitutional: negative for fatigue and weight loss Respiratory: negative for cough and wheezing Cardiovascular: negative for chest pain, fatigue and palpitations Gastrointestinal: negative for abdominal pain and change in bowel habits Genitourinary:positive for painful and heavy periods Integument/breast: negative  for nipple discharge Musculoskeletal:negative for myalgias Neurological: negative for gait problems and tremors Behavioral/Psych: negative for abusive relationship, depression Endocrine: negative for temperature intolerance      Blood pressure 127/78, pulse 91, weight 178 lb (80.7 kg), last menstrual period 06/15/2017.  Physical Exam Physical Exam General:   alert  Skin:   no rash or abnormalities  Lungs:   clear to auscultation bilaterally  Heart:   regular rate and rhythm, S1, S2 normal, no murmur, click, rub or gallop  Breasts:   normal without suspicious masses, skin or nipple changes or axillary nodes  Abdomen:  normal findings: no organomegaly, soft, non-tender and no hernia  Pelvis:  External genitalia: normal general appearance Urinary system: urethral meatus normal and bladder without fullness, nontender Vaginal: normal without tenderness, induration or masses Cervix: normal appearance Adnexa: normal bimanual exam Uterus: anteverted and non-tender, normal size    50% of 15 min visit spent on counseling and coordination of care.   Data Reviewed Labs: CBC Medication history  Assessment     1. Dysmenorrhea Rx: - Oxycodone HCl 10 MG TABS; Take 1 tablet (10 mg total) by mouth every 6 (six) hours as needed (pain).  Dispense: 20 tablet; Refill: 0 - promethazine (PHENERGAN) 25 MG tablet; Take 1 tablet (25 mg total) by mouth every 6 (six) hours as needed for nausea or vomiting.  Dispense: 30 tablet; Refill: 1 - ketorolac (TORADOL) injection 60 mg  2. Abnormal uterine bleeding (AUB) Rx: - norethindrone (AYGESTIN) 5 MG tablet; Take 1 tablet (5 mg total) by mouth daily.  Dispense: 30 tablet; Refill: 11 - Continue Nexplanon for now, but will probably need to remove in near future - she want to try Mirena IUD again when Nexplanon is removed    Plan    Follow up prn   No orders of the defined types were placed in this encounter.  Meds ordered this encounter  Medications   . Oxycodone HCl 10 MG TABS    Sig: Take 1 tablet (10 mg total) by mouth every 6 (six) hours as needed (pain).    Dispense:  20 tablet    Refill:  0  . promethazine (PHENERGAN) 25 MG tablet    Sig: Take 1 tablet (25 mg total) by mouth every 6 (six) hours as needed for nausea or vomiting.    Dispense:  30 tablet    Refill:  1  . ketorolac (TORADOL) injection 60 mg  . norethindrone (AYGESTIN) 5 MG tablet    Sig: Take 1 tablet (5 mg total) by mouth daily.    Dispense:  30 tablet    Refill:  11    Brock Bad MD

## 2017-06-24 NOTE — Progress Notes (Signed)
C/o of AUB, headaches, back pain, abdominal pain 10/10 and nausea lasting 2 weeks,

## 2017-08-02 ENCOUNTER — Encounter (HOSPITAL_COMMUNITY): Payer: Self-pay | Admitting: Emergency Medicine

## 2017-08-02 ENCOUNTER — Emergency Department (HOSPITAL_COMMUNITY)
Admission: EM | Admit: 2017-08-02 | Discharge: 2017-08-02 | Disposition: A | Discharge status: Home / Self Care | Payer: Medicaid Other | Attending: Emergency Medicine | Admitting: Emergency Medicine

## 2017-08-02 DIAGNOSIS — M79603 Pain in arm, unspecified: Secondary | ICD-10-CM | POA: Insufficient documentation

## 2017-08-02 DIAGNOSIS — Z5321 Procedure and treatment not carried out due to patient leaving prior to being seen by health care provider: Secondary | ICD-10-CM | POA: Diagnosis not present

## 2017-08-02 NOTE — ED Notes (Signed)
Patient called for room, no answer. 

## 2017-08-02 NOTE — ED Notes (Signed)
Attempted to call 3 times no response at this time.

## 2017-08-02 NOTE — ED Triage Notes (Signed)
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 Noticed a "lump" in her right arm that is painful for about two days.  No relief with ibuprofen, aleve, muscle relaxers and voltaren gel with no relief.  Small knot noted on ausculation.

## 2017-08-03 ENCOUNTER — Encounter (HOSPITAL_COMMUNITY): Payer: Self-pay | Admitting: Emergency Medicine

## 2017-08-03 ENCOUNTER — Ambulatory Visit (HOSPITAL_COMMUNITY)
Admission: EM | Admit: 2017-08-03 | Discharge: 2017-08-03 | Disposition: A | Discharge status: Home / Self Care | Payer: Medicaid Other | Attending: Family Medicine | Admitting: Family Medicine

## 2017-08-03 DIAGNOSIS — M25521 Pain in right elbow: Secondary | ICD-10-CM | POA: Diagnosis not present

## 2017-08-03 DIAGNOSIS — R2231 Localized swelling, mass and lump, right upper limb: Secondary | ICD-10-CM

## 2017-08-03 MED ORDER — TRAMADOL HCL 50 MG PO TABS: 50.0000 mg | ORAL_TABLET | Freq: Four times a day (QID) | ORAL | 0 refills | Status: DC | PRN

## 2017-08-03 MED ORDER — METHYLPREDNISOLONE SODIUM SUCC 125 MG IJ SOLR
INTRAMUSCULAR | Status: AC
  Filled 2017-08-03: qty 2

## 2017-08-03 MED ORDER — METHYLPREDNISOLONE SODIUM SUCC 125 MG IJ SOLR
125.0000 mg | Freq: Once | INTRAMUSCULAR | Status: AC
  Administered 2017-08-03: 125 mg via INTRAMUSCULAR

## 2017-08-03 NOTE — ED Triage Notes (Signed)
Pt sts right elbow pain after waking several days ago; pt sts felt lump to elbow that is painful

## 2017-08-03 NOTE — ED Provider Notes (Signed)
[facility]   [id number] 08/03/17 Arrival Time: 1029  ASSESSMENT & PLAN:  1. Right elbow pain   2. Mass of elbow region, right     Meds ordered this encounter  Medications  . methylPREDNISolone sodium succinate (SOLU-MEDROL) 125 mg/2 mL injection 125 mg  . traMADol (ULTRAM) 50 MG tablet    Sig: Take 1 tablet (50 mg total) by mouth every 6 (six) hours as needed.    Dispense:  15 tablet    Refill:  0   IM Solu-medrol given to see if this will provide some relief (declines PO prednisone). Will schedule appt with orthopaedist asap for evaluation of R elbow region mass.  [place] Controlled Substances Registry consulted for this patient. I feel the risk/benefit ratio today is favorable for proceeding with this prescription for a controlled substance. Medication sedation precautions given.  Amato return here if needed.  Reviewed expectations re: course of current medical issues. Questions answered. Outlined signs and symptoms indicating need for more acute intervention. Patient verbalized understanding. After Visit Summary given.  SUBJECTIVE: History from: patient. Marissa Smith is a 32 y.o. female who reports fairly persistent mild to moderate pain of her right elbow that is gradually worsening; described as aching without radiation. "Feel a lump near my elbow." No injury or trauma reported. Overall gradual onset over the past several days. Relieved by: nothing in particular. Worsened by: certain movements. Associated symptoms: none reported. Extremity sensation changes or weakness: none. Self treatment: tried OTCs without relief of pain. History of similar: no  ROS: As per HPI.   OBJECTIVE:  Vitals:   08/03/17 1133  BP: 133/79  Pulse: 72  Resp: 18  Temp: 98.2 F (36.8 C)  TempSrc: Oral  SpO2: 100%    General appearance: alert; no distress Extremities: no cyanosis or edema; symmetrical with no gross deformities; diffuse tenderness over her right medial elbow  with no swelling and no bruising; ROM: normal but with discomfort; I do feel a 1-1.5 cm mass proximal to the medial epicondyle, she reports this is very tender to touch which limits exam CV: normal extremity capillary refill Skin: warm and dry Neurologic: normal gait; normal symmetric reflexes in all extremities; normal sensation in all extremities Psychological: alert and cooperative; normal mood and affect  Allergies  Allergen Reactions  . Darvocet [Propoxyphene N-Acetaminophen] Nausea And Vomiting and Other (See Comments)    Reaction:  Dizziness   . Miconazole Nitrate Swelling and Rash    Past Medical History:  Diagnosis Date  . Back pain, chronic   . Cat allergies   . Depressed bipolar I disorder (HCC)   . Seasonal allergies    Social History   Socioeconomic History  . Marital status: Single    Spouse name: Not on file  . Number of children: Not on file  . Years of education: Not on file  . Highest education level: Not on file  Occupational History  . Not on file  Social Needs  . Financial resource strain: Not on file  . Food insecurity:    Worry: Not on file    Inability: Not on file  . Transportation needs:    Medical: Not on file    Non-medical: Not on file  Tobacco Use  . Smoking status: Light Tobacco Smoker    Packs/day: 0.25    Types: Cigarettes  . Smokeless tobacco: Never Used  Substance and Sexual Activity  . Alcohol use: Yes    Alcohol/week: 0.0 oz    Comment:  Once a month  . Drug use: No  . Sexual activity: Yes    Partners: Male    Birth control/protection: Implant  Lifestyle  . Physical activity:    Days per week: Not on file    Minutes per session: Not on file  . Stress: Not on file  Relationships  . Social connections:    Talks on phone: Not on file    Gets together: Not on file    Attends religious service: Not on file    Active member of club or organization: Not on file    Attends meetings of clubs or organizations: Not on file     Relationship status: Not on file  . Intimate partner violence:    Fear of current or ex partner: Not on file    Emotionally abused: Not on file    Physically abused: Not on file    Forced sexual activity: Not on file  Other Topics Concern  . Not on file  Social History Narrative  . Not on file   Family History  Problem Relation Age of Onset  . Heart failure Father    Past Surgical History:  Procedure Laterality Date  . CHOLECYSTECTOMY  2010   lap Ebony Cargo, MD 08/04/17 1154

## 2017-08-03 NOTE — Discharge Instructions (Addendum)
Be aware, pain medications Carrier cause drowsiness. Please do not drive, operate heavy machinery or make important decisions while on this medication, it can cloud your judgement.  Continue to take ibuprofen 800mg  every 8 hours (with food).

## 2017-09-09 ENCOUNTER — Encounter (HOSPITAL_COMMUNITY): Payer: Self-pay | Admitting: Family Medicine

## 2017-09-09 ENCOUNTER — Ambulatory Visit (HOSPITAL_COMMUNITY)
Admission: EM | Admit: 2017-09-09 | Discharge: 2017-09-09 | Disposition: A | Discharge status: Home / Self Care | Payer: Medicaid Other | Attending: Family Medicine | Admitting: Family Medicine

## 2017-09-09 DIAGNOSIS — R05 Cough: Secondary | ICD-10-CM

## 2017-09-09 DIAGNOSIS — J301 Allergic rhinitis due to pollen: Secondary | ICD-10-CM

## 2017-09-09 DIAGNOSIS — J020 Streptococcal pharyngitis: Secondary | ICD-10-CM

## 2017-09-09 DIAGNOSIS — J029 Acute pharyngitis, unspecified: Secondary | ICD-10-CM | POA: Diagnosis not present

## 2017-09-09 DIAGNOSIS — R0981 Nasal congestion: Secondary | ICD-10-CM

## 2017-09-09 LAB — POCT RAPID STREP A: Streptococcus, Group A Screen (Direct): POSITIVE — AB

## 2017-09-09 MED ORDER — LIDOCAINE VISCOUS HCL 2 % MT SOLN: 15.0000 mL | OROMUCOSAL | 0 refills | Status: DC | PRN

## 2017-09-09 MED ORDER — PENICILLIN G BENZATHINE 1200000 UNIT/2ML IM SUSP
INTRAMUSCULAR | Status: AC
  Filled 2017-09-09: qty 2

## 2017-09-09 MED ORDER — PROMETHAZINE-DM 6.25-15 MG/5ML PO SYRP: 5.0000 mL | ORAL_SOLUTION | Freq: Four times a day (QID) | ORAL | 0 refills | Status: DC | PRN

## 2017-09-09 MED ORDER — IPRATROPIUM BROMIDE 0.06 % NA SOLN: 2.0000 | Freq: Four times a day (QID) | NASAL | 0 refills | Status: DC

## 2017-09-09 MED ORDER — GI COCKTAIL ~~LOC~~
30.0000 mL | Freq: Once | ORAL | Status: AC
  Administered 2017-09-09: 30 mL via ORAL

## 2017-09-09 MED ORDER — FLUTICASONE PROPIONATE 50 MCG/ACT NA SUSP: 2.0000 | Freq: Every day | NASAL | 0 refills | Status: DC

## 2017-09-09 MED ORDER — PENICILLIN G BENZATHINE 1200000 UNIT/2ML IM SUSP
1.2000 10*6.[IU] | Freq: Once | INTRAMUSCULAR | Status: AC
Start: 2017-09-09 — End: 2017-09-09
  Administered 2017-09-09: 1.2 10*6.[IU] via INTRAMUSCULAR

## 2017-09-09 MED ORDER — GI COCKTAIL ~~LOC~~
ORAL | Status: AC
  Filled 2017-09-09: qty 30

## 2017-09-09 MED ORDER — ALBUTEROL SULFATE HFA 108 (90 BASE) MCG/ACT IN AERS: 1.0000 | INHALATION_SPRAY | Freq: Four times a day (QID) | RESPIRATORY_TRACT | 0 refills | Status: DC | PRN

## 2017-09-09 NOTE — ED Provider Notes (Signed)
[facility]    CSN: [id number] Arrival date & time: 09/09/17  1839     History   Chief Complaint Chief Complaint  Patient presents with  . Sore Throat    HPI Marissa Smith is a 32 y.o. female.   32 year old female comes in with 1 day history of sore throat and 1 month history of cough and congestion.  States that she has had  cough, rhinorrhea, nasal congestion, sneezing, sinus pressure for the past month.  She had thought it was allergies and has been taking allergy medicine. Woke up this morning with severe sore throat and exudates on the tonsils and came in for evaluation. She has had painful swallowing without trouble breathing, swelling to throat, drooling, tripoding, trismus.  Subjective fever.  Has tried ibuprofen, phenol throat spray, Tylenol without relief.  Current some day smoker.  She is requesting albuterol inhaler refill to help with cough, she denies shortness of breath, wheezing.     Past Medical History:  Diagnosis Date  . Back pain, chronic   . Cat allergies   . Depressed bipolar I disorder (HCC)   . Seasonal allergies     Patient Active Problem List   Diagnosis Date Noted  . Encounter for IUD insertion 08/14/2015  . Dysmenorrhea 10/31/2012  . Abnormal uterine bleeding (AUB) 10/31/2012    Past Surgical History:  Procedure Laterality Date  . CHOLECYSTECTOMY  2010   lap chole    OB History    Gravida  4   Para  0   Term  0   Preterm  0   AB  1   Living  3     SAB  0   TAB  0   Ectopic  0   Multiple  0   Live Births  3            Home Medications    Prior to Admission medications   Medication Sig Start Date End Date Taking? Authorizing Provider  acetaminophen (TYLENOL) 500 MG tablet Take 500-1,000 mg by mouth 2 (two) times daily as needed for moderate pain.    [provider]  albuterol (PROVENTIL HFA;VENTOLIN HFA) 108 (90 Base) MCG/ACT inhaler Inhale 1-2 puffs into the lungs every 6 (six) hours as  needed for wheezing or shortness of breath. 09/09/17   Cathie Hoops, Marce Charlesworth V, PA-C  cephALEXin (KEFLEX) 500 MG capsule Take 1 capsule (500 mg total) by mouth 4 (four) times daily. Patient not taking: Reported on 06/24/2017 01/01/17   Hayden Rasmussen, NP  cetirizine (ZYRTEC ALLERGY) 10 MG tablet Take 1 tablet (10 mg total) by mouth daily. 06/13/16   Everlene Farrier, PA-C  dicyclomine (BENTYL) 20 MG tablet Take 1 tablet (20 mg total) by mouth every 6 (six) hours as needed for spasms. Patient not taking: Reported on 06/24/2017 12/23/16   Raeford Razor, MD  fluticasone [facility]) 50 MCG/ACT nasal spray Place 2 sprays into both nostrils daily. 09/09/17   Cathie Hoops, Cesar Alf V, PA-C  ibuprofen (ADVIL,MOTRIN) 800 MG tablet Take 1 tablet (800 mg total) by mouth every 8 (eight) hours as needed for moderate pain. 10/26/16   Brock Bad, MD  ipratropium (ATROVENT) 0.06 % nasal spray Place 2 sprays into both nostrils 4 (four) times daily. 09/09/17   Cathie Hoops, Kimeka Badour V, PA-C  lidocaine (XYLOCAINE) 2 % solution Use as directed 15 mLs in the mouth or throat as needed for mouth pain. 09/09/17   Cathie Hoops, Zulay Corrie V, PA-C  methocarbamol (ROBAXIN) 750 MG tablet  Take 1 tablet (750 mg total) by mouth every 8 (eight) hours as needed for muscle spasms. 12/09/16   Brock Bad, MD  Multiple Vitamin (MULTIVITAMIN) tablet Take 1 tablet by mouth daily.    [provider]  norethindrone (AYGESTIN) 5 MG tablet Take 1 tablet (5 mg total) by mouth daily. 06/24/17   Brock Bad, MD  Oxycodone HCl 10 MG TABS Take 1 tablet (10 mg total) by mouth every 6 (six) hours as needed (pain). 06/24/17   Brock Bad, MD  predniSONE (DELTASONE) 50 MG tablet Take 1 tablet (50 mg total) by mouth daily. Patient not taking: Reported on 06/24/2017 05/10/17   Linwood Dibbles, MD  Prenat-FeFmCb-DSS-FA-DHA w/o A [facility] HARMONY) 27-1-260 MG CAPS Take 1 capsule by mouth daily before breakfast. 05/06/17   Brock Bad, MD  promethazine (PHENERGAN) 25 MG tablet Take 1 tablet (25 mg  total) by mouth every 6 (six) hours as needed for nausea or vomiting. 06/24/17   Brock Bad, MD  promethazine-dextromethorphan (PROMETHAZINE-DM) 6.25-15 MG/5ML syrup Take 5 mLs by mouth 4 (four) times daily as needed for cough. 09/09/17   Cathie Hoops, Latesia Norrington V, PA-C  traMADol (ULTRAM) 50 MG tablet Take 1 tablet (50 mg total) by mouth every 6 (six) hours as needed. 08/03/17   Mardella Layman, MD  zolpidem (AMBIEN) 10 MG tablet Take 10 mg by mouth at bedtime as needed for sleep.  09/23/16   [provider]    Family History Family History  Problem Relation Age of Onset  . Heart failure Father     Social History Social History   Tobacco Use  . Smoking status: Light Tobacco Smoker    Packs/day: 0.25    Types: Cigarettes  . Smokeless tobacco: Never Used  Substance Use Topics  . Alcohol use: Yes    Alcohol/week: 0.0 oz    Comment: Once a month  . Drug use: No     Allergies   Darvocet [propoxyphene n-acetaminophen] and Miconazole nitrate   Review of Systems Review of Systems  Reason unable to perform ROS: See HPI as above.     Physical Exam Triage Vital Signs ED Triage Vitals [09/09/17 1852]  Enc Vitals Group     BP 118/75     Pulse Rate 94     Resp 18     Temp 99.6 F (37.6 C)     Temp src      SpO2 100 %     Weight      Height      Head Circumference      Peak Flow      Pain Score      Pain Loc      Pain Edu?      Excl. in GC?    No data found.  Updated Vital Signs BP 118/75   Pulse 94   Temp 99.6 F (37.6 C)   Resp 18   SpO2 100%   Physical Exam  Constitutional: She is oriented to person, place, and time. She appears well-developed and well-nourished.  Non-toxic appearance. She does not appear ill. No distress.  HENT:  Head: Normocephalic and atraumatic.  Right Ear: External ear and ear canal normal. Tympanic membrane is erythematous. Tympanic membrane is not bulging.  Left Ear: External ear and ear canal normal. Tympanic membrane is erythematous.  Tympanic membrane is not bulging.  Nose: Right sinus exhibits maxillary sinus tenderness and frontal sinus tenderness. Left sinus exhibits maxillary sinus tenderness and frontal sinus  tenderness.  Mouth/Throat: Uvula is midline and mucous membranes are normal. Posterior oropharyngeal erythema present. No oropharyngeal exudate, posterior oropharyngeal edema or tonsillar abscesses. Tonsils are 2+ on the right. Tonsils are 2+ on the left. Tonsillar exudate.  Eyes: Pupils are equal, round, and reactive to light. Conjunctivae are normal.  Neck: Normal range of motion. Neck supple.  Cardiovascular: Normal rate, regular rhythm and normal heart sounds. Exam reveals no gallop and no friction rub.  No murmur heard. Pulmonary/Chest: Effort normal and breath sounds normal. She has no decreased breath sounds. She has no wheezes. She has no rhonchi. She has no rales.  Lymphadenopathy:    She has no cervical adenopathy.  Neurological: She is alert and oriented to person, place, and time.  Skin: Skin is warm and dry.  Psychiatric: She has a normal mood and affect. Her behavior is normal. Judgment normal.     UC Treatments / Results  Labs (all labs ordered are listed, but only abnormal results are displayed) Labs Reviewed  POCT RAPID STREP A - Abnormal; Notable for the following components:      Result Value   Streptococcus, Group A Screen (Direct) POSITIVE (*)    All other components within normal limits    EKG None  Radiology No results found.  Procedures Procedures (including critical care time)  Medications Ordered in UC Medications  gi cocktail (Maalox,Lidocaine,Donnatal) (30 mLs Oral Given 09/09/17 1939)  penicillin g benzathine (BICILLIN LA) 1200000 UNIT/2ML injection 1.2 Million Units (1.2 Million Units Intramuscular Given 09/09/17 1939)    Initial Impression / Assessment and Plan / UC Course  I have reviewed the triage vital signs and the nursing notes.  Pertinent labs & imaging  results that were available during my care of the patient were reviewed by me and considered in my medical decision making (see chart for details).    Rapid strep positive, patient requesting Bicillin injection.  She also has sinus pressure on exam, patient willing to treat symptomatically for now given Bicillin injection.  Symptomatic treatment discussed.  Return precautions given.  Patient expresses understanding and agrees to plan.  Final Clinical Impressions(s) / UC Diagnoses   Final diagnoses:  Strep pharyngitis    ED Prescriptions    Medication Sig Dispense Auth. Provider   albuterol (PROVENTIL HFA;VENTOLIN HFA) 108 (90 Base) MCG/ACT inhaler Inhale 1-2 puffs into the lungs every 6 (six) hours as needed for wheezing or shortness of breath. 1 Inhaler Spyridon Hornstein V, PA-C   fluticasone (FLONASE) 50 MCG/ACT nasal spray Place 2 sprays into both nostrils daily. 16 g Crystallynn Noorani V, PA-C   ipratropium (ATROVENT) 0.06 % nasal spray Place 2 sprays into both nostrils 4 (four) times daily. 15 mL Saba Gomm V, PA-C   promethazine-dextromethorphan (PROMETHAZINE-DM) 6.25-15 MG/5ML syrup Take 5 mLs by mouth 4 (four) times daily as needed for cough. 118 mL Ryhanna Dunsmore V, PA-C   lidocaine (XYLOCAINE) 2 % solution Use as directed 15 mLs in the mouth or throat as needed for mouth pain. 100 mL Threasa Alpha, [place] 09/09/17 1944

## 2017-09-09 NOTE — Discharge Instructions (Addendum)
Rapid strep positive. Bicillin injection in office today. Lidocaine solution for sore throat. Do not eat or drink 30-40 mins after use as it can stunt your gag reflex. Cough syrup as needed for cough.  Albuterol inhaler as needed for shortness of breath and wheezing.  Start flonase, atrovent nasal spray for nasal congestion/drainage. You can use over the counter nasal saline rinse such as neti pot for nasal congestion. Keep hydrated, your urine should be clear to pale yellow in color. Tylenol/motrin for fever and pain. Monitor for any worsening of symptoms, chest pain, shortness of breath, wheezing, swelling of the throat, follow up for reevaluation.   For sore throat try using a honey-based tea. Use 3 teaspoons of honey with juice squeezed from half lemon. Place shaved pieces of ginger into 1/2-1 cup of water and warm over stove top. Then mix the ingredients and repeat every 4 hours as needed.

## 2017-09-09 NOTE — ED Triage Notes (Signed)
Pt here for sore throat since this am. She has had coughing and congestion with drainage x 1 month. sts white patches on throat. Painful to swallow.

## 2017-09-10 ENCOUNTER — Encounter (HOSPITAL_COMMUNITY): Payer: Self-pay | Admitting: Emergency Medicine

## 2017-09-10 ENCOUNTER — Emergency Department (HOSPITAL_COMMUNITY)
Admission: EM | Admit: 2017-09-10 | Discharge: 2017-09-10 | Disposition: A | Discharge status: Home / Self Care | Payer: Medicaid Other | Attending: Emergency Medicine | Admitting: Emergency Medicine

## 2017-09-10 ENCOUNTER — Emergency Department (HOSPITAL_COMMUNITY): Payer: Medicaid Other

## 2017-09-10 DIAGNOSIS — F17210 Nicotine dependence, cigarettes, uncomplicated: Secondary | ICD-10-CM | POA: Insufficient documentation

## 2017-09-10 DIAGNOSIS — J020 Streptococcal pharyngitis: Secondary | ICD-10-CM | POA: Insufficient documentation

## 2017-09-10 DIAGNOSIS — J029 Acute pharyngitis, unspecified: Secondary | ICD-10-CM | POA: Diagnosis present

## 2017-09-10 DIAGNOSIS — Z79899 Other long term (current) drug therapy: Secondary | ICD-10-CM | POA: Insufficient documentation

## 2017-09-10 LAB — CBC WITH DIFFERENTIAL/PLATELET
Abs Immature Granulocytes: 0 10*3/uL (ref 0.0–0.1)
Basophils Absolute: 0 10*3/uL (ref 0.0–0.1)
Basophils Relative: 0 %
Eosinophils Absolute: 0 10*3/uL (ref 0.0–0.7)
Eosinophils Relative: 0 %
HCT: 42 % (ref 36.0–46.0)
Hemoglobin: 13.8 g/dL (ref 12.0–15.0)
Immature Granulocytes: 0 %
Lymphocytes Relative: 11 %
Lymphs Abs: 1 10*3/uL (ref 0.7–4.0)
MCH: 31.6 pg (ref 26.0–34.0)
MCHC: 32.9 g/dL (ref 30.0–36.0)
MCV: 96.1 fL (ref 78.0–100.0)
Monocytes Absolute: 0.7 10*3/uL (ref 0.1–1.0)
Monocytes Relative: 8 %
Neutro Abs: 7.3 10*3/uL (ref 1.7–7.7)
Neutrophils Relative %: 81 %
Platelets: 209 10*3/uL (ref 150–400)
RBC: 4.37 MIL/uL (ref 3.87–5.11)
RDW: 13.1 % (ref 11.5–15.5)
WBC: 9.1 10*3/uL (ref 4.0–10.5)

## 2017-09-10 LAB — BASIC METABOLIC PANEL
Anion gap: 9 (ref 5–15)
BUN: 8 mg/dL (ref 6–20)
CO2: 26 mmol/L (ref 22–32)
Calcium: 8.9 mg/dL (ref 8.9–10.3)
Chloride: 97 mmol/L — ABNORMAL LOW (ref 101–111)
Creatinine, Ser: 0.81 mg/dL (ref 0.44–1.00)
GFR calc Af Amer: >60 mL/min (ref >60)
GFR calc non Af Amer: >60 mL/min (ref >60)
Glucose, Bld: 103 mg/dL — ABNORMAL HIGH (ref 65–99)
Potassium: 4.2 mmol/L (ref 3.5–5.1)
Sodium: 132 mmol/L — ABNORMAL LOW (ref 135–145)

## 2017-09-10 LAB — I-STAT BETA HCG BLOOD, ED (MC, WL, AP ONLY): I-stat hCG, quantitative: <5 m[IU]/mL (ref <5)

## 2017-09-10 MED ORDER — DEXAMETHASONE SODIUM PHOSPHATE 10 MG/ML IJ SOLN
10.0000 mg | Freq: Once | INTRAMUSCULAR | Status: AC
  Administered 2017-09-10: 10 mg via INTRAVENOUS
  Filled 2017-09-10: qty 1

## 2017-09-10 MED ORDER — SODIUM CHLORIDE 0.9 % IV BOLUS
2000.0000 mL | Freq: Once | INTRAVENOUS | Status: AC
  Administered 2017-09-10: 2000 mL via INTRAVENOUS

## 2017-09-10 MED ORDER — ACETAMINOPHEN 325 MG PO TABS
650.0000 mg | ORAL_TABLET | Freq: Once | ORAL | Status: AC | PRN
  Administered 2017-09-10: 650 mg via ORAL
  Filled 2017-09-10: qty 2

## 2017-09-10 MED ORDER — KETOROLAC TROMETHAMINE 30 MG/ML IJ SOLN
15.0000 mg | Freq: Once | INTRAMUSCULAR | Status: AC
  Administered 2017-09-10: 15 mg via INTRAVENOUS
  Filled 2017-09-10: qty 1

## 2017-09-10 MED ORDER — IOHEXOL 300 MG/ML  SOLN
100.0000 mL | Freq: Once | INTRAMUSCULAR | Status: AC | PRN
  Administered 2017-09-10: 100 mL via INTRAVENOUS

## 2017-09-10 NOTE — ED Notes (Signed)
Pt back from CT

## 2017-09-10 NOTE — ED Triage Notes (Signed)
Pt reports sore throat, generalized body aches and productive cough with yellow mucous. Pt reports going to urgent care earlier today which they gave her a some medications in which pt reports no relief. Pt positive for strep.

## 2017-09-10 NOTE — ED Provider Notes (Signed)
[facility] EMERGENCY DEPARTMENT Provider Note   CSN: [id number] Arrival date & time: 09/10/17  0138     History   Chief Complaint No chief complaint on file.   HPI Marissa Smith is a 32 y.o. female with a past medical history of bipolar who presents today for evaluation of sore throat.  She reports that her symptoms started yesterday morning, she went to urgent care where she was seen and diagnosed by swab with strep throat.  She was treated with Bicillin IM.  She was also given an inhaler, cough medicine, and nasal spray.  She reports that her throat hurts a lot, her voice is hoarse and she generally feels poorly.  She reports that she has been taking ibuprofen and Tylenol and that "they are not helping."  She denies any history of allergies to contrast medium.  Her pain is worse on the left side than on the right side.  She reports pain with opening her mouth.  No nausea or vomiting.  HPI  Past Medical History:  Diagnosis Date  . Back pain, chronic   . Cat allergies   . Depressed bipolar I disorder (HCC)   . Seasonal allergies     Patient Active Problem List   Diagnosis Date Noted  . Encounter for IUD insertion 08/14/2015  . Dysmenorrhea 10/31/2012  . Abnormal uterine bleeding (AUB) 10/31/2012    Past Surgical History:  Procedure Laterality Date  . CHOLECYSTECTOMY  2010   lap chole     OB History    Gravida  4   Para  0   Term  0   Preterm  0   AB  1   Living  3     SAB  0   TAB  0   Ectopic  0   Multiple  0   Live Births  3            Home Medications    Prior to Admission medications   Medication Sig Start Date End Date Taking? Authorizing Provider  albuterol (PROVENTIL HFA;VENTOLIN HFA) 108 (90 Base) MCG/ACT inhaler Inhale 1-2 puffs into the lungs every 6 (six) hours as needed for wheezing or shortness of breath. 09/09/17  Yes Yu, Amy V, PA-C  fluticasone (FLONASE) 50 MCG/ACT nasal spray Place 2 sprays into both  nostrils daily. 09/09/17  Yes Yu, Amy V, PA-C  ipratropium (ATROVENT) 0.06 % nasal spray Place 2 sprays into both nostrils 4 (four) times daily. 09/09/17  Yes Yu, Amy V, PA-C  lidocaine (XYLOCAINE) 2 % solution Use as directed 15 mLs in the mouth or throat as needed for mouth pain. 09/09/17  Yes Yu, Amy V, PA-C  promethazine-dextromethorphan (PROMETHAZINE-DM) 6.25-15 MG/5ML syrup Take 5 mLs by mouth 4 (four) times daily as needed for cough. 09/09/17  Yes Yu, Amy V, PA-C  cephALEXin (KEFLEX) 500 MG capsule Take 1 capsule (500 mg total) by mouth 4 (four) times daily. Patient not taking: Reported on 06/24/2017 01/01/17   Hayden Rasmussen, NP  cetirizine (ZYRTEC ALLERGY) 10 MG tablet Take 1 tablet (10 mg total) by mouth daily. Patient not taking: Reported on 09/10/2017 06/13/16   Everlene Farrier, PA-C  dicyclomine (BENTYL) 20 MG tablet Take 1 tablet (20 mg total) by mouth every 6 (six) hours as needed for spasms. Patient not taking: Reported on 06/24/2017 12/23/16   Raeford Razor, MD  ibuprofen (ADVIL,MOTRIN) 800 MG tablet Take 1 tablet (800 mg total) by mouth every 8 (eight) hours as needed  for moderate pain. Patient not taking: Reported on 09/10/2017 10/26/16   Brock Bad, MD  methocarbamol (ROBAXIN) 750 MG tablet Take 1 tablet (750 mg total) by mouth every 8 (eight) hours as needed for muscle spasms. Patient not taking: Reported on 09/10/2017 12/09/16   Brock Bad, MD  norethindrone (AYGESTIN) 5 MG tablet Take 1 tablet (5 mg total) by mouth daily. Patient not taking: Reported on 09/10/2017 06/24/17   Brock Bad, MD  Oxycodone HCl 10 MG TABS Take 1 tablet (10 mg total) by mouth every 6 (six) hours as needed (pain). Patient not taking: Reported on 09/10/2017 06/24/17   Brock Bad, MD  predniSONE (DELTASONE) 50 MG tablet Take 1 tablet (50 mg total) by mouth daily. Patient not taking: Reported on 06/24/2017 05/10/17   Linwood Dibbles, MD  Prenat-FeFmCb-DSS-FA-DHA w/o A (CITRANATAL HARMONY) 27-1-260 MG  CAPS Take 1 capsule by mouth daily before breakfast. Patient not taking: Reported on 09/10/2017 05/06/17   Brock Bad, MD  promethazine (PHENERGAN) 25 MG tablet Take 1 tablet (25 mg total) by mouth every 6 (six) hours as needed for nausea or vomiting. Patient not taking: Reported on 09/10/2017 06/24/17   Brock Bad, MD  traMADol (ULTRAM) 50 MG tablet Take 1 tablet (50 mg total) by mouth every 6 (six) hours as needed. Patient not taking: Reported on 09/10/2017 08/03/17   Mardella Layman, MD    Family History Family History  Problem Relation Age of Onset  . Heart failure Father     Social History Social History   Tobacco Use  . Smoking status: Light Tobacco Smoker    Packs/day: 0.25    Types: Cigarettes  . Smokeless tobacco: Never Used  Substance Use Topics  . Alcohol use: Yes    Alcohol/week: 0.0 oz    Comment: Once a month  . Drug use: No     Allergies   Darvocet [propoxyphene n-acetaminophen] and Miconazole nitrate   Review of Systems Review of Systems  Constitutional: Positive for fatigue and fever. Negative for chills.  HENT: Positive for ear pain and sore throat. Negative for drooling, ear discharge and rhinorrhea.   Eyes: Negative for pain and visual disturbance.  Respiratory: Negative for cough and shortness of breath.   Cardiovascular: Negative for chest pain and palpitations.  Gastrointestinal: Negative for abdominal pain, nausea and vomiting.  Genitourinary: Negative for dysuria and hematuria.  Musculoskeletal: Negative for arthralgias, back pain, neck pain and neck stiffness.  Skin: Negative for color change and rash.  Neurological: Negative for seizures and syncope.  All other systems reviewed and are negative.    Physical Exam Updated Vital Signs BP (!) 101/44 (BP Location: Right Arm)   Pulse 83   Temp (!) 102.5 F (39.2 C) (Oral)   Resp 16   Ht  (1.626 m)   Wt 77.1 kg (170 lb)   LMP 09/03/2017 (Exact Date)   SpO2 99%   BMI 29.18  kg/m   Physical Exam  Constitutional: She is oriented to person, place, and time. She appears well-developed and well-nourished.  Disheveled with what appears to be vomit on her sweatshirt.   HENT:  Head: Normocephalic and atraumatic.  Right Ear: Tympanic membrane, external ear and ear canal normal.  Left Ear: Tympanic membrane, external ear and ear canal normal.  Nose: Nose normal.  Mouth/Throat: Mucous membranes are normal. Tonsillar abscesses present. Tonsils are 2+ on the right. Tonsils are 3+ on the left. Tonsillar exudate.  Uvula is deviated to  the right.   Eyes: Conjunctivae are normal. Right eye exhibits no discharge. Left eye exhibits no discharge. No scleral icterus.  Neck: Normal range of motion. Neck supple.  Cardiovascular: Normal rate, regular rhythm and intact distal pulses.  Pulmonary/Chest: Effort normal. No stridor. No respiratory distress.  Abdominal: She exhibits no distension.  Musculoskeletal: She exhibits no edema or deformity.  Lymphadenopathy:    She has cervical adenopathy.  Neurological: She is alert and oriented to person, place, and time. She exhibits normal muscle tone.  Skin: Skin is warm and dry. She is not diaphoretic.  Nursing note and vitals reviewed.    ED Treatments / Results  Labs (all labs ordered are listed, but only abnormal results are displayed) Labs Reviewed  BASIC METABOLIC PANEL - Abnormal; Notable for the following components:      Result Value   Sodium 132 (*)    Chloride 97 (*)    Glucose, Bld 103 (*)    All other components within normal limits  CBC WITH DIFFERENTIAL/PLATELET  I-STAT BETA HCG BLOOD, ED ([facility], [facility], [facility] ONLY)    EKG None  Radiology Ct Soft Tissue Neck W Contrast  Result Date: 09/10/2017 CLINICAL DATA:  Retropharyngeal and parapharyngeal abscess. Sore throat, difficulty swallowing, febrile EXAM: CT NECK WITH CONTRAST TECHNIQUE: Multidetector CT imaging of the neck was performed using the standard protocol  following the bolus administration of intravenous contrast. CONTRAST:  OMNIPAQUE IOHEXOL 300 MG/ML  SOLN COMPARISON:  CT neck 11/18/2012 FINDINGS: Pharynx and larynx: Bilateral enlargement of the tonsils. Bilateral tonsilliths. No peritonsillar abscess. Epiglottis normal. Larynx normal. Mild soft tissue swelling of the soft palate. Airway intact. Salivary glands: Negative Thyroid: Negative Lymph nodes: 1 cm level 2 lymph nodes bilaterally most likely reactive due to pharyngitis. Vascular: Negative Limited intracranial: Negative Visualized orbits: Negative Mastoids and visualized paranasal sinuses: Mucosal edema throughout the paranasal sinuses. Frothy secretions and retention cyst right maxillary sinus. Skeleton: Negative Upper chest: Negative Other: None IMPRESSION: Findings consistent with pharyngitis and tonsillitis without abscess. Reactive lymph nodes in the neck. Airway intact. Sinus mucosal disease. Electronically Signed   By: Marlan Palau M.D.   On: 09/10/2017 08:18    Procedures Procedures (including critical care time)  Medications Ordered in ED Medications  acetaminophen (TYLENOL) tablet 650 mg (650 mg Oral Given 09/10/17 0212)  sodium chloride 0.9 % bolus 2,000 mL (2,000 mLs Intravenous New Bag/Given 09/10/17 1610)  dexamethasone (DECADRON) injection 10 mg (10 mg Intravenous Given 09/10/17 0635)  iohexol (OMNIPAQUE) 300 MG/ML solution 100 mL (100 mLs Intravenous Contrast Given 09/10/17 0750)  ketorolac (TORADOL) 30 MG/ML injection 15 mg (15 mg Intravenous Given 09/10/17 0824)     Initial Impression / Assessment and Plan / ED Course  I have reviewed the triage vital signs and the nursing notes.  Pertinent labs & imaging results that were available during my care of the patient were reviewed by me and considered in my medical decision making (see chart for details).  Clinical Course as of [place] 31 0830  Fri Regal 31, 2019  0826 Patient re-evaluated, fluids are still running,  instructed to keep IV arm straight.    [EH]    Clinical Course User Index [EH] Cristina Gong, PA-C   Patient presents today for evaluation of sore throat.  She was seen earlier today at urgent care, diagnosed with strep throat by swab and treated with Bicillin IM.  She presents here because she is still not feeling well.  Based on clinical exam with  uneven tonsils and uvula deviation concern for peritonsillar or retropharyngeal abscess.  Labs are obtained and reviewed without significant abnormalities, white count is not elevated.  Creatinine normal.  CT soft tissue with contrast was obtained which did not show any deep space infection, consistent with pharyngitis.  Patient was informed of these results.  While in the department she was treated with IV fluids, IV steroids, and IV Toradol.  She reported mild relief with these medications.  No additional antibiotics indicated at this point.  Discharged home with over-the-counter pain medicines in addition to pain medications she was given earlier at urgent care.  Final Clinical Impressions(s) / ED Diagnoses   Final diagnoses:  Strep pharyngitis    ED Discharge Orders    None       Cristina Gong, PA-C 09/10/17 0830    Gilda Crease, MD 09/10/17 [phone]

## 2017-09-10 NOTE — Discharge Instructions (Signed)
Please take Ibuprofen (Advil, motrin) and Tylenol (acetaminophen) to relieve your pain.  You Eddings take up to 600 MG (3 pills) of normal strength ibuprofen every 8 hours as needed.  In between doses of ibuprofen you make take tylenol, up to 1,000 mg (two extra strength pills).  Do not take more than 3,000 mg tylenol in a 24 hour period.  Please check all medication labels as many medications such as pain and cold medications Liberati contain tylenol.  Do not drink alcohol while taking these medications.  Do not take other NSAID'S while taking ibuprofen (such as aleve or naproxen).  Please take ibuprofen with food to decrease stomach upset. ° °

## 2017-09-12 ENCOUNTER — Ambulatory Visit (HOSPITAL_COMMUNITY)
Admission: EM | Admit: 2017-09-12 | Discharge: 2017-09-12 | Disposition: A | Discharge status: Home / Self Care | Payer: Medicaid Other | Attending: Urgent Care | Admitting: Urgent Care

## 2017-09-12 ENCOUNTER — Encounter (HOSPITAL_COMMUNITY): Payer: Self-pay | Admitting: Family Medicine

## 2017-09-12 DIAGNOSIS — J020 Streptococcal pharyngitis: Secondary | ICD-10-CM | POA: Diagnosis not present

## 2017-09-12 DIAGNOSIS — J029 Acute pharyngitis, unspecified: Secondary | ICD-10-CM

## 2017-09-12 MED ORDER — AMOXICILLIN 400 MG/5ML PO SUSR: 800.0000 mg | Freq: Two times a day (BID) | ORAL | 0 refills | Status: DC

## 2017-09-12 MED ORDER — LIDOCAINE VISCOUS HCL 2 % MT SOLN: 15.0000 mL | OROMUCOSAL | 0 refills | Status: DC | PRN

## 2017-09-12 NOTE — ED Provider Notes (Signed)
  MRN: [id number] DOB: 1986-01-22  Subjective:   Marissa EaglesLindsay [name] is a 32 y.o. female presenting for persistent throat pain.  She has been seen here in the ER.  She was diagnosed with strep pharyngitis 3 days ago and treated with Bicillin.  She subsequently went to the ER and received a Toradol injection, steroid injection.  She was supposed to fill a prescription for Xylocaine for her throat pain but did not pick this up.  Today patient reports that she has ongoing pain with minimal change.  She has tried over-the-counter cough medications, cold and flu medicines.  Reports that she has significant difficulty swallowing, subjective fever, lymph node pain worse on her left side.  She is also coughing.  In the ER, patient had CT scan that was negative for retropharyngeal abscess.   Current Facility-Administered Medications:  .  etonogestrel (NEXPLANON) implant 68 mg, 68 mg, Subdermal, Once, Brock BadHarper, Charles A, MD  Current Outpatient Medications:  .  albuterol (PROVENTIL HFA;VENTOLIN HFA) 108 (90 Base) MCG/ACT inhaler, Inhale 1-2 puffs into the lungs every 6 (six) hours as needed for wheezing or shortness of breath., Disp: 1 Inhaler, Rfl: 0 .  amoxicillin (AMOXIL) 400 MG/5ML suspension, Take 10 mLs (800 mg total) by mouth 2 (two) times daily., Disp: 200 mL, Rfl: 0 .  fluticasone (FLONASE) 50 MCG/ACT nasal spray, Place 2 sprays into both nostrils daily., Disp: 16 g, Rfl: 0 .  ipratropium (ATROVENT) 0.06 % nasal spray, Place 2 sprays into both nostrils 4 (four) times daily., Disp: 15 mL, Rfl: 0 .  lidocaine (XYLOCAINE) 2 % solution, Use as directed 15 mLs in the mouth or throat as needed for mouth pain., Disp: 100 mL, Rfl: 0 .  promethazine-dextromethorphan (PROMETHAZINE-DM) 6.25-15 MG/5ML syrup, Take 5 mLs by mouth 4 (four) times daily as needed for cough., Disp: 118 mL, Rfl: 0   Allergies  Allergen Reactions  . Darvocet [Propoxyphene N-Acetaminophen] Nausea And Vomiting and Other (See Comments)   Reaction:  Dizziness   . Miconazole Nitrate Swelling and Rash    Past Medical History:  Diagnosis Date  . Back pain, chronic   . Cat allergies   . Depressed bipolar I disorder (HCC)   . Seasonal allergies      Past Surgical History:  Procedure Laterality Date  . CHOLECYSTECTOMY  2010   lap chole    Objective:   Vitals: BP 126/78   Pulse 89   Temp 98.2 F (36.8 C) (Oral)   Resp 18   LMP 09/03/2017 (Exact Date)   SpO2 100%   Physical Exam  HENT:  Mouth/Throat: Posterior oropharyngeal erythema (with associated tonsillar edema) present. No oropharyngeal exudate.  Neck: Normal range of motion. Neck supple.  Left-sided cervical lymph node pain without adenopathy.  Lymphadenopathy:    She has no cervical adenopathy.   Assessment and Plan :   Strep pharyngitis  Sore throat  I switch patient to liquid amoxicillin.  I refilled her Xylocaine mouth solution.  Patient is to maintain schedule Tylenol and ibuprofen.  Return to clinic if symptoms persist.     Wallis BambergMani, Tylia Ewell, PA-C 09/12/17 1247

## 2017-09-12 NOTE — ED Triage Notes (Signed)
Pt here for sore throat. She was here Thursday and given PCN shot and not better.

## 2017-12-08 ENCOUNTER — Ambulatory Visit: Payer: Medicaid Other | Admitting: Obstetrics

## 2017-12-08 ENCOUNTER — Other Ambulatory Visit (HOSPITAL_COMMUNITY)
Admission: RE | Admit: 2017-12-08 | Discharge: 2017-12-08 | Disposition: A | Discharge status: Home / Self Care | Payer: Medicaid Other | Source: Ambulatory Visit | Attending: Obstetrics | Admitting: Obstetrics

## 2017-12-08 ENCOUNTER — Encounter: Payer: Self-pay | Admitting: Obstetrics

## 2017-12-08 VITALS — BP 119/82 | HR 63 | Ht 64.0 in | Wt 174.3 lb

## 2017-12-08 DIAGNOSIS — R350 Frequency of micturition: Secondary | ICD-10-CM | POA: Diagnosis not present

## 2017-12-08 DIAGNOSIS — R102 Pelvic and perineal pain: Secondary | ICD-10-CM | POA: Diagnosis present

## 2017-12-08 DIAGNOSIS — N946 Dysmenorrhea, unspecified: Secondary | ICD-10-CM | POA: Diagnosis not present

## 2017-12-08 LAB — POCT URINALYSIS DIPSTICK
Bilirubin, UA: NEGATIVE
Blood, UA: NEGATIVE
Glucose, UA: NEGATIVE
Ketones, UA: NEGATIVE
Nitrite, UA: NEGATIVE
Odor: NEGATIVE
Protein, UA: NEGATIVE
Spec Grav, UA: 1.01 (ref 1.010–1.025)
Urobilinogen, UA: 0.2 E.U./dL
pH, UA: 7 (ref 5.0–8.0)

## 2017-12-08 MED ORDER — OXYCODONE HCL 10 MG PO TABS: 10.0000 mg | ORAL_TABLET | Freq: Four times a day (QID) | ORAL | 0 refills | Status: DC | PRN

## 2017-12-08 MED ORDER — NITROFURANTOIN MONOHYD MACRO 100 MG PO CAPS: 100.0000 mg | ORAL_CAPSULE | Freq: Two times a day (BID) | ORAL | 2 refills | Status: DC

## 2017-12-08 NOTE — Addendum Note (Signed)
Addended by: Natale MilchSTALLING, BRITTANY D on: 12/08/2017 04:51 PM   Modules accepted: Orders

## 2017-12-08 NOTE — Progress Notes (Signed)
Patient currently has nexplanon, reports irregular bleeding, states that she has had 3 cycles this month, lasting 2-3 days to a little over a week. Pt reports frequent urination, abdominal pain, vag discharge, denies odor.

## 2017-12-08 NOTE — Progress Notes (Signed)
Patient ID: Marissa Smith, female   DOB: [birth year], 32 y.o.   MRN: [id number]  Chief Complaint  Patient presents with  . GYN    HPI Marissa Smith is a 32 y.o. female.  Complains of urinary frequency and lower abdominal and back pain.  Also had 3 periods this month with severe cramping, unrelieved with Ibuprofen.  Has had Nexplanon for ~ 1 year and has had no more than one 2-3 day period a month.  She has been under extreme stress and feels that this [place] have cause this abnormal vaginal bleeding. HPI  Past Medical History:  Diagnosis Date  . Back pain, chronic   . Cat allergies   . Depressed bipolar I disorder (HCC)   . Seasonal allergies     Past Surgical History:  Procedure Laterality Date  . CHOLECYSTECTOMY  2010   lap chole    Family History  Problem Relation Age of Onset  . Heart failure Father     Social History Social History   Tobacco Use  . Smoking status: Light Tobacco Smoker    Packs/day: 0.25    Types: Cigarettes  . Smokeless tobacco: Never Used  Substance Use Topics  . Alcohol use: Yes    Alcohol/week: 0.0 standard drinks    Comment: Once a month  . Drug use: No    Allergies  Allergen Reactions  . Darvocet [Propoxyphene N-Acetaminophen] Nausea And Vomiting and Other (See Comments)    Reaction:  Dizziness   . Miconazole Nitrate Swelling and Rash    Current Outpatient Medications  Medication Sig Dispense Refill  . albuterol (PROVENTIL HFA;VENTOLIN HFA) 108 (90 Base) MCG/ACT inhaler Inhale 1-2 puffs into the lungs every 6 (six) hours as needed for wheezing or shortness of breath. 1 Inhaler 0  . ibuprofen (ADVIL,MOTRIN) 800 MG tablet Take 800 mg by mouth every 8 (eight) hours as needed.    Marland Kitchen amoxicillin (AMOXIL) 400 MG/5ML suspension Take 10 mLs (800 mg total) by mouth 2 (two) times daily. (Patient not taking: Reported on 12/08/2017) 200 mL 0  . fluticasone (FLONASE) 50 MCG/ACT nasal spray Place 2 sprays into both nostrils daily. (Patient not taking:  Reported on 12/08/2017) 16 g 0  . ipratropium (ATROVENT) 0.06 % nasal spray Place 2 sprays into both nostrils 4 (four) times daily. (Patient not taking: Reported on 12/08/2017) 15 mL 0  . lidocaine (XYLOCAINE) 2 % solution Use as directed 15 mLs in the mouth or throat as needed for mouth pain. (Patient not taking: Reported on 12/08/2017) 100 mL 0  . nitrofurantoin, macrocrystal-monohydrate, (MACROBID) 100 MG capsule Take 1 capsule (100 mg total) by mouth 2 (two) times daily. 14 capsule 2  . Oxycodone HCl 10 MG TABS Take 1 tablet (10 mg total) by mouth every 6 (six) hours as needed (pain). 20 tablet 0  . promethazine-dextromethorphan (PROMETHAZINE-DM) 6.25-15 MG/5ML syrup Take 5 mLs by mouth 4 (four) times daily as needed for cough. (Patient not taking: Reported on 12/08/2017) 118 mL 0   Current Facility-Administered Medications  Medication Dose Route Frequency Provider Last Rate Last Dose  . etonogestrel (NEXPLANON) implant 68 mg  68 mg Subdermal Once Brock Bad, MD        Review of Systems Review of Systems Constitutional: negative for fatigue and weight loss Respiratory: negative for cough and wheezing Cardiovascular: negative for chest pain, fatigue and palpitations Gastrointestinal: negative for abdominal pain and change in bowel habits Genitourinary:POSITIVE for abnormal periods, urinary frequency and abdominal and back pain  Integument/breast: negative for nipple discharge Musculoskeletal:POSITIVE for myalgias Neurological: negative for gait problems and tremors Behavioral/Psych: negative for abusive relationship, depression Endocrine: negative for temperature intolerance      Blood pressure 119/82, pulse 63, height 5\' 4"  (1.626 m), weight 174 lb 4.8 oz (79.1 kg), last menstrual period 12/01/2017.  Physical Exam Physical Exam           General:  Alert and no distress Abdomen:  normal findings: no organomegaly, soft, non-tender and no hernia  Pelvis:  External genitalia: normal  general appearance Urinary system: urethral meatus normal and bladder without fullness, nontender Vaginal: normal without tenderness, induration or masses Cervix: normal appearance Adnexa: normal bimanual exam Uterus: anteverted and non-tender, normal size    50% of 15 min visit spent on counseling and coordination of care.   Data Reviewed Urinalysis Wet Prep and Cultures  Assessment     1. Pelvic pain in female Rx: - Cervicovaginal ancillary only  2. Urinary frequency Rx: - nitrofurantoin, macrocrystal-monohydrate, (MACROBID) 100 MG capsule; Take 1 capsule (100 mg total) by mouth 2 (two) times daily.  Dispense: 14 capsule; Refill: 2 - Urine Culture  3. Dysmenorrhea Rx: - Oxycodone HCl 10 MG TABS; Take 1 tablet (10 mg total) by mouth every 6 (six) hours as needed (pain).  Dispense: 20 tablet; Refill: 0    Plan    Follow up in 3 months for annual and pap  Orders Placed This Encounter  Procedures  . Urine Culture   Meds ordered this encounter  Medications  . nitrofurantoin, macrocrystal-monohydrate, (MACROBID) 100 MG capsule    Sig: Take 1 capsule (100 mg total) by mouth 2 (two) times daily.    Dispense:  14 capsule    Refill:  2  . Oxycodone HCl 10 MG TABS    Sig: Take 1 tablet (10 mg total) by mouth every 6 (six) hours as needed (pain).    Dispense:  20 tablet    Refill:  0    Brock BadHARLES A. Anwar Sakata MD 12-08-2017

## 2017-12-10 LAB — CERVICOVAGINAL ANCILLARY ONLY
Bacterial vaginitis: NEGATIVE
Candida vaginitis: NEGATIVE
Chlamydia: NEGATIVE
Neisseria Gonorrhea: NEGATIVE
Trichomonas: NEGATIVE

## 2017-12-10 LAB — URINE CULTURE

## 2017-12-16 ENCOUNTER — Emergency Department (HOSPITAL_COMMUNITY)
Admission: EM | Admit: 2017-12-16 | Discharge: 2017-12-17 | Disposition: A | Discharge status: Home / Self Care | Payer: Medicaid Other | Attending: Emergency Medicine | Admitting: Emergency Medicine

## 2017-12-16 ENCOUNTER — Encounter (HOSPITAL_COMMUNITY): Payer: Self-pay | Admitting: Emergency Medicine

## 2017-12-16 ENCOUNTER — Other Ambulatory Visit: Payer: Self-pay

## 2017-12-16 DIAGNOSIS — F17210 Nicotine dependence, cigarettes, uncomplicated: Secondary | ICD-10-CM | POA: Insufficient documentation

## 2017-12-16 DIAGNOSIS — T7840XA Allergy, unspecified, initial encounter: Secondary | ICD-10-CM | POA: Diagnosis not present

## 2017-12-16 DIAGNOSIS — L509 Urticaria, unspecified: Secondary | ICD-10-CM | POA: Diagnosis present

## 2017-12-16 MED ORDER — FAMOTIDINE IN NACL 20-0.9 MG/50ML-% IV SOLN
20.0000 mg | INTRAVENOUS | Status: AC
  Administered 2017-12-16: 20 mg via INTRAVENOUS
  Filled 2017-12-16: qty 50

## 2017-12-16 MED ORDER — METHYLPREDNISOLONE SODIUM SUCC 125 MG IJ SOLR
125.0000 mg | Freq: Once | INTRAMUSCULAR | Status: AC
  Administered 2017-12-16: 125 mg via INTRAVENOUS
  Filled 2017-12-16: qty 2

## 2017-12-16 MED ORDER — LORATADINE 10 MG PO TABS
10.0000 mg | ORAL_TABLET | Freq: Once | ORAL | Status: AC
  Administered 2017-12-16: 10 mg via ORAL
  Filled 2017-12-16: qty 1

## 2017-12-16 MED ORDER — DIPHENHYDRAMINE HCL 50 MG/ML IJ SOLN
25.0000 mg | Freq: Once | INTRAMUSCULAR | Status: AC
  Administered 2017-12-16: 25 mg via INTRAVENOUS
  Filled 2017-12-16: qty 1

## 2017-12-16 MED ORDER — SODIUM CHLORIDE 0.9 % IV BOLUS
1000.0000 mL | Freq: Once | INTRAVENOUS | Status: AC
  Administered 2017-12-16: 1000 mL via INTRAVENOUS

## 2017-12-16 NOTE — ED Triage Notes (Signed)
Pt presents with rash/hives to entire body. Eyes noted to be red and swollen. States she was outside yesterday doing landscaping.

## 2017-12-16 NOTE — ED Provider Notes (Signed)
[facility] EMERGENCY DEPARTMENT Provider Note   CSN: [id number] Arrival date & time: 12/16/17  2207     History   Chief Complaint Chief Complaint  Patient presents with  . Allergic Reaction    HPI Marissa Smith is a 32 y.o. female.  32 year old female with a history of bipolar depression presents to the emergency department complaining of hives.  She was doing landscaping work outside of her home yesterday.  She is unsure of whether she [place] have come into contact with poison ivy or similar.  Began to notice swelling around her left eye yesterday.  Awoke with increased redness and swelling to her face as well as hives to her chest and extremities.  Describes the rash is very pruritic.  She has been consistently using Benadryl without improvement to her itching.  Denies any vesicles or pustules.  No associated fever, wheezing, difficulty breathing or swallowing.  No new food ingestions, soaps, lotions, detergents.  No contact with persons with similar rash.  The history is provided by the patient. No language interpreter was used.  Allergic Reaction    Past Medical History:  Diagnosis Date  . Back pain, chronic   . Cat allergies   . Depressed bipolar I disorder (HCC)   . Seasonal allergies     Patient Active Problem List   Diagnosis Date Noted  . Encounter for IUD insertion 08/14/2015  . Dysmenorrhea 10/31/2012  . Abnormal uterine bleeding (AUB) 10/31/2012    Past Surgical History:  Procedure Laterality Date  . CHOLECYSTECTOMY  2010   lap chole     OB History    Gravida  4   Para  0   Term  0   Preterm  0   AB  1   Living  3     SAB  0   TAB  0   Ectopic  0   Multiple  0   Live Births  3            Home Medications    Prior to Admission medications   Medication Sig Start Date End Date Taking? Authorizing Provider  albuterol (PROVENTIL HFA;VENTOLIN HFA) 108 (90 Base) MCG/ACT inhaler Inhale 1-2 puffs into the lungs every 6  (six) hours as needed for wheezing or shortness of breath. 09/09/17   Cathie Hoops, Amy V, PA-C  amoxicillin (AMOXIL) 400 MG/5ML suspension Take 10 mLs (800 mg total) by mouth 2 (two) times daily. Patient not taking: Reported on 12/08/2017 09/12/17   Wallis Bamberg, PA-C  dexamethasone (DECADRON) 0.75 MG tablet Take 2 tablets (1.5 mg total) by mouth 2 (two) times daily with a meal for 2 days, THEN 1 tablet (0.75 mg total) 2 (two) times daily with a meal for 1 day, THEN 1 tablet (0.75 mg total) daily for 2 days. 12/17/17 12/22/17  Antony Madura, PA-C  fluticasone (FLONASE) 50 MCG/ACT nasal spray Place 2 sprays into both nostrils daily. Patient not taking: Reported on 12/08/2017 09/09/17   Belinda Fisher, PA-C  hydrOXYzine (ATARAX/VISTARIL) 25 MG tablet Take 1 tablet (25 mg total) by mouth every 6 (six) hours as needed for itching. 12/17/17   Antony Madura, PA-C  ibuprofen (ADVIL,MOTRIN) 800 MG tablet Take 800 mg by mouth every 8 (eight) hours as needed.    [provider]  ipratropium (ATROVENT) 0.06 % nasal spray Place 2 sprays into both nostrils 4 (four) times daily. Patient not taking: Reported on 12/08/2017 09/09/17   Belinda Fisher, PA-C  lidocaine (  XYLOCAINE) 2 % solution Use as directed 15 mLs in the mouth or throat as needed for mouth pain. Patient not taking: Reported on 12/08/2017 09/12/17   Wallis Bamberg, PA-C  nitrofurantoin, macrocrystal-monohydrate, (MACROBID) 100 MG capsule Take 1 capsule (100 mg total) by mouth 2 (two) times daily. 12/08/17   Brock Bad, MD  Oxycodone HCl 10 MG TABS Take 1 tablet (10 mg total) by mouth every 6 (six) hours as needed (pain). 12/08/17   Brock Bad, MD  promethazine-dextromethorphan (PROMETHAZINE-DM) 6.25-15 MG/5ML syrup Take 5 mLs by mouth 4 (four) times daily as needed for cough. Patient not taking: Reported on 12/08/2017 09/09/17   Lurline Idol    Family History Family History  Problem Relation Age of Onset  . Heart failure Father     Social History Social History     Tobacco Use  . Smoking status: Light Tobacco Smoker    Packs/day: 0.25    Types: Cigarettes  . Smokeless tobacco: Never Used  Substance Use Topics  . Alcohol use: Yes    Alcohol/week: 0.0 standard drinks    Comment: Once a month  . Drug use: No     Allergies   Darvocet [propoxyphene n-acetaminophen] and Miconazole nitrate   Review of Systems Review of Systems Ten systems reviewed and are negative for acute change, except as noted in the HPI.    Physical Exam Updated Vital Signs BP 113/73   Pulse (!) 55   Temp 98.1 F (36.7 C) (Oral)   Resp 14   Ht 5\' 5"  (1.651 m)   Wt 78.9 kg   LMP 12/01/2017   SpO2 99%   BMI 28.96 kg/m   Physical Exam  Constitutional: She is oriented to person, place, and time. She appears well-developed and well-nourished. No distress.  Nontoxic appearing and in no distress  HENT:  Head: Normocephalic and atraumatic.  No angioedema or oral lesions.  Tolerating secretions without difficulty.  Uvula midline.  Eyes: Conjunctivae and EOM are normal. No scleral icterus.  Neck: Normal range of motion.  Cardiovascular: Normal rate, regular rhythm and intact distal pulses.  Pulmonary/Chest: Effort normal. No stridor. No respiratory distress. She has no wheezes. She has no rales.  Lungs clear to auscultation bilaterally  Musculoskeletal: Normal range of motion.  Neurological: She is alert and oriented to person, place, and time.  Skin: Skin is warm and dry. Rash noted. She is not diaphoretic. No erythema. No pallor.  Punctate papular rash noted primarily to chest and anterior abdomen.  This is also scattered on the distal forearms bilaterally.  No vesicles or pustules.  No associated induration.  Rash is pruritic, erythematous, blanching.  Spares palms and soles.  Psychiatric: She has a normal mood and affect. Her behavior is normal.  Nursing note and vitals reviewed.    ED Treatments / Results  Labs (all labs ordered are listed, but only  abnormal results are displayed) Labs Reviewed - No data to display  EKG None  Radiology No results found.  Procedures Procedures (including critical care time)  Medications Ordered in ED Medications  hydrOXYzine (ATARAX/VISTARIL) tablet 25 mg (has no administration in time range)  methylPREDNISolone sodium succinate (SOLU-MEDROL) 125 mg/2 mL injection 125 mg (125 mg Intravenous Given 12/16/17 2307)  diphenhydrAMINE (BENADRYL) injection 25 mg (25 mg Intravenous Given 12/16/17 2307)  famotidine (PEPCID) IVPB 20 mg premix (0 mg Intravenous Stopped 12/17/17 0010)  sodium chloride 0.9 % bolus 1,000 mL (0 mLs Intravenous Stopped 12/17/17 0032)  loratadine (  CLARITIN) tablet 10 mg (10 mg Oral Given 12/16/17 2307)    12:42 AM Symptoms have improved following Claritin, Pepcid, Benadryl, Solu-Medrol.  No difficulty breathing or swallowing.  Rash has lessened in degree of erythema.  Patient states she is feeling much better.   Initial Impression / Assessment and Plan / ED Course  I have reviewed the triage vital signs and the nursing notes.  Pertinent labs & imaging results that were available during my care of the patient were reviewed by me and considered in my medical decision making (see chart for details).     32 year old female presenting for allergic reaction.  Unknown inciting features, but believed to have been caused by work in the yard yesterday.  Patient has had symptomatic improvement following antihistamines and IV steroids.  She denies any difficulty breathing or swallowing.  Vital signs have been stable.  No hypoxia.  Normal phonation.  Tolerating secretions.  Will continue with outpatient management with steroid taper and Atarax.  Encouraged use of daily Claritin or Zyrtec.  Patient to follow-up with her primary care doctor to ensure resolution of symptoms.  Return precautions discussed and provided. Patient discharged in stable condition with no unaddressed concerns.  Vitals:    12/16/17 2300 12/16/17 2315 12/16/17 2345 12/17/17 0000  BP: (!) 105/57 121/76 106/67 113/73  Pulse:   61 (!) 55  Resp:   12 14  Temp:      TempSrc:      SpO2:      Weight:      Height:        Final Clinical Impressions(s) / ED Diagnoses   Final diagnoses:  Allergic reaction, initial encounter    ED Discharge Orders         Ordered    dexamethasone (DECADRON) 0.75 MG tablet     12/17/17 0039    hydrOXYzine (ATARAX/VISTARIL) 25 MG tablet  Every 6 hours PRN     12/17/17 0039           Antony Madura, PA-C 12/17/17 0044    Cathren Laine, MD 12/17/17 1526

## 2017-12-17 ENCOUNTER — Other Ambulatory Visit: Payer: Self-pay

## 2017-12-17 MED ORDER — HYDROXYZINE HCL 25 MG PO TABS
25.0000 mg | ORAL_TABLET | Freq: Once | ORAL | Status: AC
  Administered 2017-12-17: 25 mg via ORAL
  Filled 2017-12-17: qty 1

## 2017-12-17 MED ORDER — HYDROXYZINE HCL 25 MG PO TABS: 25.0000 mg | ORAL_TABLET | Freq: Four times a day (QID) | ORAL | 0 refills | Status: DC | PRN

## 2017-12-17 MED ORDER — DEXAMETHASONE 0.75 MG PO TABS: ORAL_TABLET | ORAL | 0 refills | Status: AC

## 2017-12-17 NOTE — Discharge Instructions (Signed)
Take Decadron as prescribed until finished.  We recommend daily Claritin or Zyrtec.  You have been prescribed Atarax to take for itching.  You [place] supplement this with Benadryl as needed.  You Devin benefit from use of over-the-counter Zanfel.  Can be purchased at your local pharmacy or [facility].  Follow-up with your primary care doctor to ensure resolution of symptoms.

## 2017-12-17 NOTE — ED Notes (Signed)
Patient verbalizes understanding of discharge instructions. Opportunity for questioning and answers were provided. Armband removed by staff, pt discharged from ED.  

## 2018-02-16 ENCOUNTER — Telehealth: Payer: Self-pay

## 2018-02-16 NOTE — Telephone Encounter (Signed)
I returned call to pt Monday on #[phone] VM box was full  Pt called today left message no on has returned her call from Friday or Monday  I have called pt 3x no answer / busy signal. and once on #on file [phone] which is not accepting calls.

## 2018-02-16 NOTE — Telephone Encounter (Signed)
Return call from pt requesting refills on Rx she was prescribed at last visit .pt states she was giving nausea Rx, Ibuprofen and oxycodone 10mg   Still having problems with periods.  Pt is scheduling annual exam   Please advise.

## 2018-02-17 ENCOUNTER — Other Ambulatory Visit: Payer: Self-pay

## 2018-02-17 DIAGNOSIS — R102 Pelvic and perineal pain: Secondary | ICD-10-CM

## 2018-02-17 MED ORDER — IBUPROFEN 800 MG PO TABS: 800.0000 mg | ORAL_TABLET | Freq: Three times a day (TID) | ORAL | 0 refills | Status: DC | PRN

## 2018-02-17 NOTE — Telephone Encounter (Signed)
Confirming no refill on IBP 800mg ?

## 2018-02-17 NOTE — Telephone Encounter (Signed)
Refills denied.  Cannot continue these meds long term.

## 2018-02-17 NOTE — Telephone Encounter (Signed)
She Howat have refill on Ibuprofen only.

## 2018-02-17 NOTE — Progress Notes (Signed)
Rx sent requested by pt Other pain Rx were denied Pt can discuss gyn issues at annual exam .

## 2018-02-18 ENCOUNTER — Encounter (HOSPITAL_COMMUNITY): Payer: Self-pay

## 2018-02-18 ENCOUNTER — Emergency Department (HOSPITAL_COMMUNITY): Payer: Medicaid Other

## 2018-02-18 ENCOUNTER — Encounter (HOSPITAL_COMMUNITY): Payer: Self-pay | Admitting: Emergency Medicine

## 2018-02-18 ENCOUNTER — Ambulatory Visit (HOSPITAL_COMMUNITY)
Admission: EM | Admit: 2018-02-18 | Discharge: 2018-02-18 | Disposition: A | Discharge status: Home / Self Care | Payer: Medicaid Other | Attending: Internal Medicine | Admitting: Internal Medicine

## 2018-02-18 ENCOUNTER — Emergency Department (HOSPITAL_COMMUNITY)
Admission: EM | Admit: 2018-02-18 | Discharge: 2018-02-18 | Disposition: A | Discharge status: Home / Self Care | Payer: Medicaid Other | Attending: Emergency Medicine | Admitting: Emergency Medicine

## 2018-02-18 DIAGNOSIS — F17210 Nicotine dependence, cigarettes, uncomplicated: Secondary | ICD-10-CM | POA: Insufficient documentation

## 2018-02-18 DIAGNOSIS — J3489 Other specified disorders of nose and nasal sinuses: Secondary | ICD-10-CM | POA: Insufficient documentation

## 2018-02-18 DIAGNOSIS — R0981 Nasal congestion: Secondary | ICD-10-CM | POA: Insufficient documentation

## 2018-02-18 DIAGNOSIS — R51 Headache: Secondary | ICD-10-CM | POA: Diagnosis not present

## 2018-02-18 DIAGNOSIS — R519 Headache, unspecified: Secondary | ICD-10-CM

## 2018-02-18 DIAGNOSIS — N3000 Acute cystitis without hematuria: Secondary | ICD-10-CM | POA: Insufficient documentation

## 2018-02-18 DIAGNOSIS — M542 Cervicalgia: Secondary | ICD-10-CM | POA: Diagnosis not present

## 2018-02-18 DIAGNOSIS — R05 Cough: Secondary | ICD-10-CM | POA: Diagnosis not present

## 2018-02-18 LAB — URINALYSIS, ROUTINE W REFLEX MICROSCOPIC
Bilirubin Urine: NEGATIVE
Glucose, UA: NEGATIVE mg/dL
Hgb urine dipstick: NEGATIVE
Ketones, ur: NEGATIVE mg/dL
Nitrite: NEGATIVE
Protein, ur: NEGATIVE mg/dL
Specific Gravity, Urine: 1.016 (ref 1.005–1.030)
pH: 7 (ref 5.0–8.0)

## 2018-02-18 MED ORDER — METOCLOPRAMIDE HCL 5 MG/ML IJ SOLN
10.0000 mg | Freq: Once | INTRAMUSCULAR | Status: AC
  Administered 2018-02-18: 10 mg via INTRAVENOUS
  Filled 2018-02-18: qty 2

## 2018-02-18 MED ORDER — CEPHALEXIN 500 MG PO CAPS: 500.0000 mg | ORAL_CAPSULE | Freq: Two times a day (BID) | ORAL | 0 refills | Status: AC

## 2018-02-18 MED ORDER — MAGNESIUM SULFATE 2 GM/50ML IV SOLN
2.0000 g | Freq: Once | INTRAVENOUS | Status: AC
  Administered 2018-02-18: 2 g via INTRAVENOUS
  Filled 2018-02-18: qty 50

## 2018-02-18 MED ORDER — DEXAMETHASONE SODIUM PHOSPHATE 10 MG/ML IJ SOLN
10.0000 mg | Freq: Once | INTRAMUSCULAR | Status: AC
  Administered 2018-02-18: 10 mg via INTRAVENOUS
  Filled 2018-02-18: qty 1

## 2018-02-18 MED ORDER — SODIUM CHLORIDE 0.9 % IV SOLN
INTRAVENOUS | Status: DC | PRN
  Administered 2018-02-18: 19:00:00 via INTRAVENOUS

## 2018-02-18 MED ORDER — SODIUM CHLORIDE 0.9 % IV BOLUS
500.0000 mL | Freq: Once | INTRAVENOUS | Status: AC
  Administered 2018-02-18: 500 mL via INTRAVENOUS

## 2018-02-18 MED ORDER — DIPHENHYDRAMINE HCL 50 MG/ML IJ SOLN
50.0000 mg | Freq: Once | INTRAMUSCULAR | Status: AC
  Administered 2018-02-18: 50 mg via INTRAVENOUS
  Filled 2018-02-18: qty 1

## 2018-02-18 MED ORDER — KETOROLAC TROMETHAMINE 30 MG/ML IJ SOLN
30.0000 mg | Freq: Once | INTRAMUSCULAR | Status: AC
  Administered 2018-02-18: 30 mg via INTRAVENOUS
  Filled 2018-02-18: qty 1

## 2018-02-18 NOTE — ED Notes (Signed)
Pt reports her pain is remarkably better. Pt does endorse a sharp type pain on the right side of her head when she turns her head to the left.

## 2018-02-18 NOTE — ED Triage Notes (Addendum)
Pt presents to ED for assessment of posterior headache since last night, fever last night 100.3.  Patient seen at [facility] and sent here for further evaluation.  Patient has increase in pain when touching her chin to her chest.  C/o nausea, c/o no new tingling.  Light and sound sensitivity as well.    Patient adds as a side note that she has been having increased urination.  C/o "kind of" burning when she urinates.  Denies discharge.

## 2018-02-18 NOTE — ED Notes (Signed)
Patient verbalizes understanding of discharge instructions. Opportunity for questioning and answers were provided. Armband removed by staff, pt discharged from ED home via POV.  

## 2018-02-18 NOTE — ED Provider Notes (Signed)
[facility]ARE [facility]   [id number] 02/18/18 Arrival Time: 1625  [id number]  SUBJECTIVE:  Marissa Smith is a 32 y.o. female who complains of headache that began last night.  Denies a precipitating event, or recent head trauma.  Patient localizes her pain to the right side of face, frontal aspect and top of head.  Describes the pain as constant and throbbing in character.  Reports "it feels like something hit" the back of her head.  States pain was 10/10 last night, now an 8/10.  Patient has tried ibuprofen, tylenol, and tramadol without relief. Symptoms are made worse with light and loud noises.  Reports similar symptoms in the past with sinus infection, but not this severe.  This is the worst headache of her life.   Patient also mentions nasal congestion, rhinorrhea, sore throat, and intermittent cough for the past couple of days.  Has tried OTC medications without relief.  Denies aggravating factors.    Complains of fever with tmax of 101 at home, nausea, photophobia, phonophobia, watery eyes.  Patient denies vomiting, aura, chest pain, SOB, abdominal pain, weakness, numbness or tingling.     ROS: As per HPI.  Past Medical History:  Diagnosis Date  . Back pain, chronic   . Cat allergies   . Depressed bipolar I disorder (HCC)   . Seasonal allergies    Past Surgical History:  Procedure Laterality Date  . CHOLECYSTECTOMY  2010   lap chole   Allergies  Allergen Reactions  . Darvocet [Propoxyphene N-Acetaminophen] Nausea And Vomiting and Other (See Comments)    Reaction:  Dizziness   . Miconazole Nitrate Swelling and Rash   Current Facility-Administered Medications on File Prior to Encounter  Medication Dose Route Frequency Provider Last Rate Last Dose  . etonogestrel (NEXPLANON) implant 68 mg  68 mg Subdermal Once Brock Bad, MD       Current Outpatient Medications on File Prior to Encounter  Medication Sig Dispense Refill  . albuterol (PROVENTIL HFA;VENTOLIN HFA) 108  (90 Base) MCG/ACT inhaler Inhale 1-2 puffs into the lungs every 6 (six) hours as needed for wheezing or shortness of breath. 1 Inhaler 0  . hydrOXYzine (ATARAX/VISTARIL) 25 MG tablet Take 1 tablet (25 mg total) by mouth every 6 (six) hours as needed for itching. 12 tablet 0  . ibuprofen (ADVIL,MOTRIN) 800 MG tablet Take 1 tablet (800 mg total) by mouth every 8 (eight) hours as needed. 30 tablet 0  . Oxycodone HCl 10 MG TABS Take 1 tablet (10 mg total) by mouth every 6 (six) hours as needed (pain). 20 tablet 0   Social History   Socioeconomic History  . Marital status: Single    Spouse name: Not on file  . Number of children: Not on file  . Years of education: Not on file  . Highest education level: Not on file  Occupational History  . Not on file  Social Needs  . Financial resource strain: Not on file  . Food insecurity:    Worry: Not on file    Inability: Not on file  . Transportation needs:    Medical: Not on file    Non-medical: Not on file  Tobacco Use  . Smoking status: Light Tobacco Smoker    Packs/day: 0.25    Types: Cigarettes  . Smokeless tobacco: Never Used  Substance and Sexual Activity  . Alcohol use: Yes    Alcohol/week: 0.0 standard drinks    Comment: Once a month  . Drug use: No  .  Sexual activity: Yes    Partners: Male    Birth control/protection: Implant  Lifestyle  . Physical activity:    Days per week: Not on file    Minutes per session: Not on file  . Stress: Not on file  Relationships  . Social connections:    Talks on phone: Not on file    Gets together: Not on file    Attends religious service: Not on file    Active member of club or organization: Not on file    Attends meetings of clubs or organizations: Not on file    Relationship status: Not on file  . Intimate partner violence:    Fear of current or ex partner: Not on file    Emotionally abused: Not on file    Physically abused: Not on file    Forced sexual activity: Not on file  Other  Topics Concern  . Not on file  Social History Narrative  . Not on file   Family History  Problem Relation Age of Onset  . Heart failure Father     OBJECTIVE:  Vitals:   02/18/18 1651  BP: 114/77  Pulse: 63  Resp: 18  Temp: 98.3 F (36.8 C)  TempSrc: Oral  SpO2: 95%    General appearance: alert; appears uncomfortable; holding head HEENT: normocephalic; atraumatic; tender to palpation over frontal aspect and top of head; Ears: EACs clear, TMs pearly gray; Eyes: PERRL.  EOM grossly intact.  Right sided maxillary sinus tenderness; Nose: no rhinorrhea; tonsils nonerythematous, uvula midline  Neck: LROM due to discomfort; negative Kernig's test and Brudzinski's test Lungs: clear to auscultation bilaterally Heart: regular rate and rhythm.  Radial pulses 2+ symmetrical bilaterally Extremities: no edema; symmetrical with no gross deformities Skin: warm and dry Neurologic: CN 2-12 grossly intact; finger to nose without difficulty; normal gait; strength and sensation intact bilaterally about the upper and lower extremities; negative pronator drift Psychological: alert and cooperative; normal mood and affect  ASSESSMENT & PLAN:  1. Worst headache of life   2. Sinus pressure   3. Neck pain    No orders of the defined types were placed in this encounter.  Recommending further evaluation and management in the ED for worst headache of life Patient aware and in agreement with this plan  Reviewed expectations re: course of current medical issues. Questions answered. Outlined signs and symptoms indicating need for more acute intervention. Patient verbalized understanding. After Visit Summary given.   Rennis Harding, PA-C 02/18/18 1733

## 2018-02-18 NOTE — Discharge Instructions (Addendum)
Recommending further evaluation and management in the ED for worst headache of life Patient aware and in agreement with this plan

## 2018-02-18 NOTE — ED Provider Notes (Addendum)
Care assumed at shift change from Dr. Jacqulyn Bath pending CT head.  See his note for full HPI and work-up.  Briefly, patient presenting from urgent care with headache as well as UTI symptoms.  Headache is not concerning for meningitis, patient with normal range of motion of the neck, resting comfortably with out meningismus.  Normal neurologic exam.  Pain treated in the ED with migraine cocktail with improvement.  Plan to reevaluate patient after CT head.  Treat UTI. Physical Exam  BP 132/81 (BP Location: Right Arm)   Pulse 62   Temp 98.3 F (36.8 C) (Oral)   Resp 18   SpO2 100%   Physical Exam  Constitutional: She appears well-developed and well-nourished.  HENT:  Head: Normocephalic and atraumatic.  Eyes: Conjunctivae are normal.  Cardiovascular: Normal rate.  Pulmonary/Chest: Effort normal.  Abdominal: Soft.  Neurological: She is alert.  Skin: Skin is warm.  Psychiatric: She has a normal mood and affect. Her behavior is normal.  Nursing note and vitals reviewed.   ED Course/Procedures      Procedures  MDM  CT head is negative for acute pathology.  Patient reevaluated reports further improvement in symptoms and states she is is ready for discharge.  Will discharge with Keflex for mild UTI.  Urine culture sent.  Provided neurology referral for follow-up of headaches.  Discussed strict return precautions.  Patient is well-appearing, agreeable plan, safe for discharge.  Discussed results, findings, treatment and follow up. Patient advised of return precautions. Patient verbalized understanding and agreed with plan.    Robinson, [place] N, PA-C 02/18/18 2006    Robinson, [place] N, PA-C 02/18/18 2112    Maia Plan, MD 02/19/18 [phone]

## 2018-02-18 NOTE — ED Provider Notes (Signed)
Emergency Department Provider Note   I have reviewed the triage vital signs and the nursing notes.   HISTORY  Chief Complaint Headache   HPI Londyn Hotard Schuh is a 32 y.o. female with PMH of seasonal allergies, migraine HA, and chronic back pain presents to the emergency department for evaluation of headache.  Patient has had she describes having pain aspect of her scalp which has gradually worsened throughout the day. She has had runny nose, sinus pressure, congestion, and cough in addition to HA. She also notes some dysuria but has made an appointment for this next week. No vaginal bleeding or discharge. She states that her HA has migrated to the forehead/temple area. She notes some photo and phonophobia. She has had some associated neck tightness. No radiation of neck pain down the spine. States that the [name] does feel similar to her prior migraine HA but the location is different.    Past Medical History:  Diagnosis Date  . Back pain, chronic   . Cat allergies   . Depressed bipolar I disorder (HCC)   . Seasonal allergies     Patient Active Problem List   Diagnosis Date Noted  . Encounter for IUD insertion 08/14/2015  . Dysmenorrhea 10/31/2012  . Abnormal uterine bleeding (AUB) 10/31/2012    Past Surgical History:  Procedure Laterality Date  . CHOLECYSTECTOMY  2010   lap chole   Allergies Darvocet [propoxyphene n-acetaminophen] and Miconazole nitrate  Family History  Problem Relation Age of Onset  . Heart failure Father     Social History Social History   Tobacco Use  . Smoking status: Light Tobacco Smoker    Packs/day: 0.25    Types: Cigarettes  . Smokeless tobacco: Never Used  Substance Use Topics  . Alcohol use: Yes    Alcohol/week: 0.0 standard drinks    Comment: Once a month  . Drug use: No    Review of Systems  Constitutional: Positive fever yesterday.  Eyes: No visual changes. Positive photophobia.  ENT: No sore throat. Positive runny nose and  sinus pressure.  Cardiovascular: Denies chest pain. Respiratory: Denies shortness of breath. Positive dry cough.  Gastrointestinal: No abdominal pain.  No nausea, no vomiting.  No diarrhea.  No constipation. Genitourinary: Positive for dysuria. Musculoskeletal: Negative for back pain. Skin: Negative for rash. Neurological: Negative for focal weakness or numbness. Positive HA.   10-point ROS otherwise negative.  ____________________________________________   PHYSICAL EXAM:  VITAL SIGNS: ED Triage Vitals  Enc Vitals Group     BP 02/18/18 1740 132/81     Pulse Rate 02/18/18 1740 62     Resp 02/18/18 1740 18     Temp 02/18/18 1740 98.3 F (36.8 C)     Temp Source 02/18/18 1740 Oral     SpO2 02/18/18 1740 100 %     Pain Score 02/18/18 1741 10   Constitutional: Alert and oriented. Well appearing and in no acute distress. Sitting up in chair and interacting with family at bedside.  Eyes: Conjunctivae are normal. PERRL.  Head: Atraumatic. Nose: No congestion/rhinnorhea. Mouth/Throat: Mucous membranes are moist.  Neck: No stridor.  No meningeal signs. Patient with some stiffness along the trapezius bilaterally.  Cardiovascular: Normal rate, regular rhythm. Good peripheral circulation. Grossly normal heart sounds.   Respiratory: Normal respiratory effort.  No retractions. Lungs CTAB. Gastrointestinal: Soft and nontender. No distention.  Musculoskeletal: No lower extremity tenderness nor edema. No gross deformities of extremities. Neurologic:  Normal speech and language. No gross focal neurologic  deficits are appreciated.  Skin:  Skin is warm, dry and intact. No rash noted.  ____________________________________________   LABS (all labs ordered are listed, but only abnormal results are displayed)  Labs Reviewed  URINALYSIS, ROUTINE W REFLEX MICROSCOPIC - Abnormal; Notable for the following components:      Result Value   Leukocytes, UA MODERATE (*)    Bacteria, UA RARE (*)     All other components within normal limits  URINE CULTURE   ____________________________________________  RADIOLOGY  Ct Head Wo Contrast  Result Date: 02/18/2018 CLINICAL DATA:  Posterior headache starting last night.  Fever. EXAM: CT HEAD WITHOUT CONTRAST TECHNIQUE: Contiguous axial images were obtained from the base of the skull through the vertex without intravenous contrast. COMPARISON:  Neck CT from 11/18/2012 FINDINGS: Brain: The brainstem, cerebellum, cerebral peduncles, thalami, basal ganglia, basilar cisterns, and ventricular system appear within normal limits. No intracranial hemorrhage, mass lesion, or acute CVA. Vascular: Unremarkable Skull: Unremarkable Sinuses/Orbits: Chronic bilateral maxillary, ethmoid, and sphenoid sinusitis. Other: No supplemental non-categorized findings. IMPRESSION: 1. Chronic paranasal sinusitis. 2.  Otherwise, no significant abnormalities are observed. Electronically Signed   By: [name] M.D.   On: 02/18/2018 19:36    ____________________________________________   PROCEDURES  Procedure(s) performed:   Procedures  None ____________________________________________   INITIAL IMPRESSION / ASSESSMENT AND PLAN / ED COURSE  Pertinent labs & imaging results that were available during my care of the patient were reviewed by me and considered in my medical decision making (see chart for details).  Patient presents to the ED with headache and URI/sinus symptoms. My suspicion for meningitis in this case is exceedingly low. Symptoms, other than location of HA, are typical of her prior migraines. No fever here or at [facility]. Given new location plan for CT head with some likely unrelated UTI symptoms. Will treat with migraine cocktail and reassess.    06:40 PM Patient with improvement of HA but continues to have some mild/moderate temple throbbing. CT head and UA pending. Will add decadron and Magnesium.   UA with evidence of UTI. Suspect this is the  cause of patient's fever. Patient also with dysuria. No flank pain. HA resolving with treatment in the ED. CT pending and care transferred to evening PA. Would not offer LP in this clinical setting. Anticipate discharge with normal CT and improved HA.  ____________________________________________  FINAL CLINICAL IMPRESSION(S) / ED DIAGNOSES  Final diagnoses:  Acute nonintractable headache, unspecified headache type  Acute cystitis without hematuria     MEDICATIONS GIVEN DURING THIS VISIT:  Medications  0.9 %  sodium chloride infusion ( Intravenous Stopped 02/18/18 1955)  sodium chloride 0.9 % bolus 500 mL (0 mLs Intravenous Stopped 02/18/18 1856)  ketorolac (TORADOL) 30 MG/ML injection 30 mg (30 mg Intravenous Given 02/18/18 1807)  metoCLOPramide (REGLAN) injection 10 mg (10 mg Intravenous Given 02/18/18 1807)  diphenhydrAMINE (BENADRYL) injection 50 mg (50 mg Intravenous Given 02/18/18 1805)  dexamethasone (DECADRON) injection 10 mg (10 mg Intravenous Given 02/18/18 1856)  magnesium sulfate IVPB 2 g 50 mL (0 g Intravenous Stopped 02/18/18 1955)     NEW OUTPATIENT MEDICATIONS STARTED DURING THIS VISIT:  New Prescriptions   CEPHALEXIN (KEFLEX) 500 MG CAPSULE    Take 1 capsule (500 mg total) by mouth 2 (two) times daily for 5 days.    Note:  This document was prepared using Dragon voice recognition software and Blacksher include unintentional dictation errors.  Alona Bene, MD Emergency Medicine    Sura Canul, Arlyss Repress, MD 02/18/18  2018  

## 2018-02-18 NOTE — ED Triage Notes (Signed)
Pt presents with ongoing headache/pressure.

## 2018-02-18 NOTE — Discharge Instructions (Signed)
Please read instructions below. Take your antibiotic, keflex, as directed until it is gone. Drink plenty of water. You can take 600 mg of Advil/ibuprofen every 6 hours as needed for headache. Schedule an appointment with your primary care provider to follow up in 1 week.  Schedule an appointment with the Neurologist to follow up on your headache and discuss preventative treatment. Return to the ER for severely worsening headache, vision changes, fever, weakness or numbness, or new or concerning symptoms.

## 2018-02-19 LAB — URINE CULTURE
Culture: NO GROWTH
Special Requests: NORMAL

## 2018-02-21 ENCOUNTER — Encounter: Payer: Self-pay | Admitting: Family Medicine

## 2018-02-21 ENCOUNTER — Encounter (HOSPITAL_COMMUNITY): Payer: Self-pay

## 2018-02-21 ENCOUNTER — Ambulatory Visit (INDEPENDENT_AMBULATORY_CARE_PROVIDER_SITE_OTHER): Payer: Medicaid Other | Admitting: Family Medicine

## 2018-02-21 ENCOUNTER — Inpatient Hospital Stay (HOSPITAL_COMMUNITY)
Admission: AD | Admit: 2018-02-21 | Discharge: 2018-02-21 | Disposition: A | Discharge status: Home / Self Care | Payer: Medicaid Other | Source: Ambulatory Visit | Attending: Obstetrics & Gynecology | Admitting: Obstetrics & Gynecology

## 2018-02-21 VITALS — BP 129/75 | HR 54 | Ht 65.0 in | Wt 175.8 lb

## 2018-02-21 DIAGNOSIS — R109 Unspecified abdominal pain: Secondary | ICD-10-CM | POA: Diagnosis present

## 2018-02-21 DIAGNOSIS — N921 Excessive and frequent menstruation with irregular cycle: Secondary | ICD-10-CM

## 2018-02-21 DIAGNOSIS — Z975 Presence of (intrauterine) contraceptive device: Secondary | ICD-10-CM

## 2018-02-21 DIAGNOSIS — F17210 Nicotine dependence, cigarettes, uncomplicated: Secondary | ICD-10-CM | POA: Insufficient documentation

## 2018-02-21 DIAGNOSIS — Z3202 Encounter for pregnancy test, result negative: Secondary | ICD-10-CM | POA: Diagnosis not present

## 2018-02-21 DIAGNOSIS — Z3049 Encounter for surveillance of other contraceptives: Secondary | ICD-10-CM

## 2018-02-21 DIAGNOSIS — R0789 Other chest pain: Secondary | ICD-10-CM | POA: Insufficient documentation

## 2018-02-21 DIAGNOSIS — Z711 Person with feared health complaint in whom no diagnosis is made: Secondary | ICD-10-CM

## 2018-02-21 DIAGNOSIS — N939 Abnormal uterine and vaginal bleeding, unspecified: Secondary | ICD-10-CM | POA: Diagnosis not present

## 2018-02-21 DIAGNOSIS — Z3046 Encounter for surveillance of implantable subdermal contraceptive: Secondary | ICD-10-CM

## 2018-02-21 HISTORY — DX: Headache: R51

## 2018-02-21 HISTORY — DX: Headache, unspecified: R51.9

## 2018-02-21 LAB — CBC
HCT: 39.6 % (ref 36.0–46.0)
Hemoglobin: 13.1 g/dL (ref 12.0–15.0)
MCH: 31.8 pg (ref 26.0–34.0)
MCHC: 33.1 g/dL (ref 30.0–36.0)
MCV: 96.1 fL (ref 80.0–100.0)
Platelets: 235 10*3/uL (ref 150–400)
RBC: 4.12 MIL/uL (ref 3.87–5.11)
RDW: 13.5 % (ref 11.5–15.5)
WBC: 5.9 10*3/uL (ref 4.0–10.5)
nRBC: 0 % (ref 0.0–0.2)

## 2018-02-21 LAB — URINALYSIS, ROUTINE W REFLEX MICROSCOPIC
Bilirubin Urine: NEGATIVE
Glucose, UA: NEGATIVE mg/dL
Hgb urine dipstick: NEGATIVE
Ketones, ur: NEGATIVE mg/dL
Nitrite: NEGATIVE
Protein, ur: NEGATIVE mg/dL
Specific Gravity, Urine: 1.034 — ABNORMAL HIGH (ref 1.005–1.030)
pH: 5 (ref 5.0–8.0)

## 2018-02-21 LAB — POCT PREGNANCY, URINE: Preg Test, Ur: NEGATIVE

## 2018-02-21 MED ORDER — ONDANSETRON 8 MG PO TBDP
8.0000 mg | ORAL_TABLET | Freq: Once | ORAL | Status: AC
  Administered 2018-02-21: 8 mg via ORAL
  Filled 2018-02-21: qty 1

## 2018-02-21 MED ORDER — KETOROLAC TROMETHAMINE 60 MG/2ML IM SOLN
60.0000 mg | Freq: Once | INTRAMUSCULAR | Status: AC
  Administered 2018-02-21: 60 mg via INTRAMUSCULAR
  Filled 2018-02-21: qty 2

## 2018-02-21 NOTE — MAU Provider Note (Signed)
History     CSN: [id number]  Arrival date and time: 02/21/18 1502   First Provider Initiated Contact with Patient 02/21/18 1531      Chief Complaint  Patient presents with  . Abdominal Pain  . Chest Pain   HPI Marissa Smith is a 32 y.o. [phone] non pregnant female who presents requesting her nexplanon be removed due to various complaints. She states she is having a period 2-3 times a month that is causing cramping that she rates a 5/10. She states she is also having frequent headaches and mood swings. She states it all started after the nexplanon placement and wants it removed. She is also reporting sharp left sided chest pain. She was seen in the ER on 11/8 for headache and is being treated for a UTI.  OB History    Gravida  4   Para  0   Term  0   Preterm  0   AB  1   Living  3     SAB  0   TAB  0   Ectopic  0   Multiple  0   Live Births  3           Past Medical History:  Diagnosis Date  . Back pain, chronic   . Cat allergies   . Depressed bipolar I disorder (HCC)   . Headache   . Seasonal allergies     Past Surgical History:  Procedure Laterality Date  . CHOLECYSTECTOMY  2010   lap chole    Family History  Problem Relation Age of Onset  . Heart failure Father     Social History   Tobacco Use  . Smoking status: Light Tobacco Smoker    Packs/day: 0.25    Types: Cigarettes  . Smokeless tobacco: Never Used  Substance Use Topics  . Alcohol use: Yes    Alcohol/week: 0.0 standard drinks    Comment: Once a month  . Drug use: No    Allergies:  Allergies  Allergen Reactions  . Darvocet [Propoxyphene N-Acetaminophen] Nausea And Vomiting and Other (See Comments)    Reaction:  Dizziness   . Miconazole Nitrate Swelling and Rash    Facility-Administered Medications Prior to Admission  Medication Dose Route Frequency Provider Last Rate Last Dose  . etonogestrel (NEXPLANON) implant 68 mg  68 mg Subdermal Once Brock Bad, MD        Medications Prior to Admission  Medication Sig Dispense Refill Last Dose  . albuterol (PROVENTIL HFA;VENTOLIN HFA) 108 (90 Base) MCG/ACT inhaler Inhale 1-2 puffs into the lungs every 6 (six) hours as needed for wheezing or shortness of breath. 1 Inhaler 0 Taking  . cephALEXin (KEFLEX) 500 MG capsule Take 1 capsule (500 mg total) by mouth 2 (two) times daily for 5 days. 10 capsule 0   . hydrOXYzine (ATARAX/VISTARIL) 25 MG tablet Take 1 tablet (25 mg total) by mouth every 6 (six) hours as needed for itching. 12 tablet 0   . ibuprofen (ADVIL,MOTRIN) 800 MG tablet Take 1 tablet (800 mg total) by mouth every 8 (eight) hours as needed. 30 tablet 0   . Oxycodone HCl 10 MG TABS Take 1 tablet (10 mg total) by mouth every 6 (six) hours as needed (pain). 20 tablet 0     Review of Systems  Constitutional: Negative.  Negative for fatigue and fever.  HENT: Negative.   Respiratory: Negative.  Negative for shortness of breath.   Cardiovascular: Positive for chest pain.  Gastrointestinal: Positive for abdominal pain. Negative for constipation, diarrhea, nausea and vomiting.  Genitourinary: Positive for vaginal bleeding. Negative for dysuria.  Neurological: Negative.  Negative for dizziness and headaches.   Physical Exam   Blood pressure 131/72, pulse 76, temperature 98 F (36.7 C), temperature source Oral, resp. rate 20, height 5\' 5"  (1.651 m), weight 79.8 kg, last menstrual period 02/11/2018, SpO2 99 %.  Physical Exam  Nursing note and vitals reviewed. Constitutional: She is oriented to person, place, and time. She appears well-developed and well-nourished. No distress.  HENT:  Head: Normocephalic.  Eyes: Pupils are equal, round, and reactive to light.  Cardiovascular: Normal rate, regular rhythm and normal heart sounds. Exam reveals no friction rub.  No murmur heard. Respiratory: Effort normal and breath sounds normal. No respiratory distress. She has no wheezes. She has no rales.  GI: Soft.  Bowel sounds are normal. She exhibits no distension and no mass. There is no tenderness. There is no rebound and no guarding.  Neurological: She is alert and oriented to person, place, and time.  Skin: Skin is warm and dry.  Psychiatric: She has a normal mood and affect. Her behavior is normal. Judgment and thought content normal.   MAU Course  Procedures Results for orders placed or performed during the hospital encounter of 02/21/18 (from the past 24 hour(s))  Urinalysis, Routine w reflex microscopic     Status: Abnormal   Collection Time: 02/21/18  3:23 PM  Result Value Ref Range   Color, Urine YELLOW YELLOW   APPearance HAZY (A) CLEAR   Specific Gravity, Urine 1.034 (H) 1.005 - 1.030   pH 5.0 5.0 - 8.0   Glucose, UA NEGATIVE NEGATIVE mg/dL   Hgb urine dipstick NEGATIVE NEGATIVE   Bilirubin Urine NEGATIVE NEGATIVE   Ketones, ur NEGATIVE NEGATIVE mg/dL   Protein, ur NEGATIVE NEGATIVE mg/dL   Nitrite NEGATIVE NEGATIVE   Leukocytes, UA LARGE (A) NEGATIVE   RBC / HPF 0-5 0 - 5 RBC/hpf   WBC, UA 21-50 0 - 5 WBC/hpf   Bacteria, UA RARE (A) NONE SEEN   Squamous Epithelial / LPF 11-20 0 - 5   Mucus PRESENT   Pregnancy, urine POC     Status: None   Collection Time: 02/21/18  3:26 PM  Result Value Ref Range   Preg Test, Ur NEGATIVE NEGATIVE  CBC     Status: None   Collection Time: 02/21/18  4:30 PM  Result Value Ref Range   WBC 5.9 4.0 - 10.5 K/uL   RBC 4.12 3.87 - 5.11 MIL/uL   Hemoglobin 13.1 12.0 - 15.0 g/dL   HCT 16.1 09.6 - 04.5 %   MCV 96.1 80.0 - 100.0 fL   MCH 31.8 26.0 - 34.0 pg   MCHC 33.1 30.0 - 36.0 g/dL   RDW 40.9 81.1 - 91.4 %   Platelets 235 150 - 400 K/uL   nRBC 0.0 0.0 - 0.2 %   MDM UA, UPT CBC ED EKG- consulted with cardiologist Dr. Anne Fu- no acute changes to EKG, no signs of ischemia Toradol IM  Zofran ODT  Patient informed that nexplanons are not removed in MAU but she can go to [facility] after being seen today to have it removed or wait for her appointment  on 11/21. She desires to go to walk in clinic for removal.  Assessment and Plan   1. Breakthrough bleeding on Nexplanon   2. Physically well but worried    -Discharge home in stable condition -Encouraged patient  to use ibuprofen for pain management at home -Chest pain precautions discussed -Patient advised to follow-up with [facility] for nexplanon removal today. -Patient Pouncey return to MAU as needed or if her condition were to change or worsen  Rolm Bookbinder CNM 02/21/2018, 4:42 PM

## 2018-02-21 NOTE — MAU Note (Signed)
Pt has been having cramping for 3 weeks with headache and nausea. Is currently having left sided chest pain that is sharp and tight and is not sure if it's anxiety or not. Thinks that it Thrall be due to her nexplanon. Can't get into her OB until the 21st.

## 2018-02-21 NOTE — Patient Instructions (Signed)
Ask about other options for abnormal bleeding at follow-up visit

## 2018-02-21 NOTE — Progress Notes (Signed)
Pt had Nexplanon inserted on 01/19/2017, states since the Nexplanon Insertion she has been having periods 3 x's per month.Alot of pain for 3 weeks straight, also has been very emotional.

## 2018-02-21 NOTE — Progress Notes (Signed)
GYNECOLOGY OFFICE VISIT NOTE History:  32 y.o. [phone] here today for Nexplanon removal.   - bleeding twice a month - bad cramping, went to MAU today and got Toradol and nausea medication - feels like hormones are "all messed up"  - taking lots of ibuprofen  - does not desire children at this time, considering IUD in future but does not want today  The following portions of the patient's history were reviewed and updated as appropriate: allergies, current medications, past family history, past medical history, past social history, past surgical history and problem list.    Review of Systems:  Pertinent items noted in HPI ROS  Objective:  Physical Exam BP 129/75   Pulse (!) 54   Ht 5\' 5"  (1.651 m)   Wt 175 lb 12.8 oz (79.7 kg)   LMP 02/11/2018   BMI 29.25 kg/m  Physical Exam  Constitutional: She is oriented to person, place, and time.  Uncomfortable appearing, tired   HENT:  Head: Normocephalic and atraumatic.  Cardiovascular: Normal rate.  Pulmonary/Chest: Breath sounds normal.  Musculoskeletal:  Palpable Nexplanon in left upper extremity   Neurological: She is alert and oriented to person, place, and time.  Psychiatric: She has a normal mood and affect. Her behavior is normal.  Nursing note and vitals reviewed.  Labs and Imaging Results for orders placed or performed during the hospital encounter of 02/21/18 (from the past 168 hour(s))  Urinalysis, Routine w reflex microscopic   Collection Time: 02/21/18  3:23 PM  Result Value Ref Range   Color, Urine YELLOW YELLOW   APPearance HAZY (A) CLEAR   Specific Gravity, Urine 1.034 (H) 1.005 - 1.030   pH 5.0 5.0 - 8.0   Glucose, UA NEGATIVE NEGATIVE mg/dL   Hgb urine dipstick NEGATIVE NEGATIVE   Bilirubin Urine NEGATIVE NEGATIVE   Ketones, ur NEGATIVE NEGATIVE mg/dL   Protein, ur NEGATIVE NEGATIVE mg/dL   Nitrite NEGATIVE NEGATIVE   Leukocytes, UA LARGE (A) NEGATIVE   RBC / HPF 0-5 0 - 5 RBC/hpf   WBC, UA 21-50 0  - 5 WBC/hpf   Bacteria, UA RARE (A) NONE SEEN   Squamous Epithelial / LPF 11-20 0 - 5   Mucus PRESENT   Pregnancy, urine POC   Collection Time: 02/21/18  3:26 PM  Result Value Ref Range   Preg Test, Ur NEGATIVE NEGATIVE  CBC   Collection Time: 02/21/18  4:30 PM  Result Value Ref Range   WBC 5.9 4.0 - 10.5 K/uL   RBC 4.12 3.87 - 5.11 MIL/uL   Hemoglobin 13.1 12.0 - 15.0 g/dL   HCT 98.1 19.1 - 47.8 %   MCV 96.1 80.0 - 100.0 fL   MCH 31.8 26.0 - 34.0 pg   MCHC 33.1 30.0 - 36.0 g/dL   RDW 29.5 62.1 - 30.8 %   Platelets 235 150 - 400 K/uL   nRBC 0.0 0.0 - 0.2 %  Results for orders placed or performed during the hospital encounter of 02/18/18 (from the past 168 hour(s))  Urine culture   Collection Time: 02/18/18  5:57 PM  Result Value Ref Range   Specimen Description URINE, CLEAN CATCH    Special Requests Normal    Culture      NO GROWTH Performed at [facility], [address], [place], [place] [address]    Report Status 02/19/2018 FINAL   Urinalysis, Routine w reflex microscopic   Collection Time: 02/18/18  5:57 PM  Result Value Ref Range  Color, Urine YELLOW YELLOW   APPearance CLEAR CLEAR   Specific Gravity, Urine 1.016 1.005 - 1.030   pH 7.0 5.0 - 8.0   Glucose, UA NEGATIVE NEGATIVE mg/dL   Hgb urine dipstick NEGATIVE NEGATIVE   Bilirubin Urine NEGATIVE NEGATIVE   Ketones, ur NEGATIVE NEGATIVE mg/dL   Protein, ur NEGATIVE NEGATIVE mg/dL   Nitrite NEGATIVE NEGATIVE   Leukocytes, UA MODERATE (A) NEGATIVE   RBC / HPF 0-5 0 - 5 RBC/hpf   WBC, UA 6-10 0 - 5 WBC/hpf   Bacteria, UA RARE (A) NONE SEEN   Squamous Epithelial / LPF 0-5 0 - 5   Mucus PRESENT    Ct Head Wo Contrast  Result Date: 02/18/2018 CLINICAL DATA:  Posterior headache starting last night.  Fever. EXAM: CT HEAD WITHOUT CONTRAST TECHNIQUE: Contiguous axial images were obtained from the base of the skull through the vertex without intravenous contrast. COMPARISON:  Neck CT from 11/18/2012 FINDINGS:  Brain: The brainstem, cerebellum, cerebral peduncles, thalami, basal ganglia, basilar cisterns, and ventricular system appear within normal limits. No intracranial hemorrhage, mass lesion, or acute CVA. Vascular: Unremarkable Skull: Unremarkable Sinuses/Orbits: Chronic bilateral maxillary, ethmoid, and sphenoid sinusitis. Other: No supplemental non-categorized findings. IMPRESSION: 1. Chronic paranasal sinusitis. 2.  Otherwise, no significant abnormalities are observed. Electronically Signed   By: [name] M.D.   On: 02/18/2018 19:36    Assessment & Plan:   32yo [id number] with abnormal uterine bleeding in setting of Nexplanon placement. Already seen and evaluated in MAU today, here in clinic for removal of Nexplanon only.   Nexplanon Removal Patient identified, informed consent performed, consent signed.   Appropriate time out taken. Nexplanon site identified.  Area prepped in usual sterile fashon. One ml of 1% lidocaine was used to anesthetize the area at the distal end of the implant. A small stab incision was made right beside the implant on the distal portion.  The Nexplanon rod was grasped using hemostats and removed without difficulty.  There was minimal blood loss. There were no complications. A pressure bandage was applied to reduce any bruising.  The patient tolerated the procedure well and was given post procedure instructions.  Patient is planning to use condoms and eventually IUD for contraception/attempt conception.  Marissa Smith. [name], DO OB Family Medicine Fellow, [facility] for [employer], [facility]

## 2018-02-21 NOTE — Discharge Instructions (Signed)
Contraception Choices Contraception, also called birth control, refers to methods or devices that prevent pregnancy. Hormonal methods Contraceptive implant A contraceptive implant is a thin, plastic tube that contains a hormone. It is inserted into the upper part of the arm. It can remain in place for up to 3 years. Progestin-only injections Progestin-only injections are injections of progestin, a synthetic form of the hormone progesterone. They are given every 3 months by a health care provider. Birth control pills Birth control pills are pills that contain hormones that prevent pregnancy. They must be taken once a day, preferably at the same time each day. Birth control patch The birth control patch contains hormones that prevent pregnancy. It is placed on the skin and must be changed once a week for three weeks and removed on the fourth week. A prescription is needed to use this method of contraception. Vaginal ring A vaginal ring contains hormones that prevent pregnancy. It is placed in the vagina for three weeks and removed on the fourth week. After that, the process is repeated with a new ring. A prescription is needed to use this method of contraception. Emergency contraceptive Emergency contraceptives prevent pregnancy after unprotected sex. They come in pill form and can be taken up to 5 days after sex. They work best the sooner they are taken after having sex. Most emergency contraceptives are available without a prescription. This method should not be used as your only form of birth control. Barrier methods Female condom A female condom is a thin sheath that is worn over the penis during sex. Condoms keep sperm from going inside a woman's body. They can be used with a spermicide to increase their effectiveness. They should be disposed after a single use. Female condom A female condom is a soft, loose-fitting sheath that is put into the vagina before sex. The condom keeps sperm from going  inside a woman's body. They should be disposed after a single use. Diaphragm A diaphragm is a soft, dome-shaped barrier. It is inserted into the vagina before sex, along with a spermicide. The diaphragm blocks sperm from entering the uterus, and the spermicide kills sperm. A diaphragm should be left in the vagina for 6-8 hours after sex and removed within 24 hours. A diaphragm is prescribed and fitted by a health care provider. A diaphragm should be replaced every 1-2 years, after giving birth, after gaining more than 15 lb (6.8 kg), and after pelvic surgery. Cervical cap A cervical cap is a round, soft latex or plastic cup that fits over the cervix. It is inserted into the vagina before sex, along with spermicide. It blocks sperm from entering the uterus. The cap should be left in place for 6-8 hours after sex and removed within 48 hours. A cervical cap must be prescribed and fitted by a health care provider. It should be replaced every 2 years. Sponge A sponge is a soft, circular piece of polyurethane foam with spermicide on it. The sponge helps block sperm from entering the uterus, and the spermicide kills sperm. To use it, you make it wet and then insert it into the vagina. It should be inserted before sex, left in for at least 6 hours after sex, and removed and thrown away within 30 hours. Spermicides Spermicides are chemicals that kill or block sperm from entering the cervix and uterus. They can come as a cream, jelly, suppository, foam, or tablet. A spermicide should be inserted into the vagina with an applicator at least 10-15 minutes before   sex to allow time for it to work. The process must be repeated every time you have sex. Spermicides do not require a prescription. Intrauterine contraception Intrauterine device (IUD) An IUD is a T-shaped device that is put in a woman's uterus. There are two types:  Hormone IUD.This type contains progestin, a synthetic form of the hormone progesterone. This  type can stay in place for 3-5 years.  Copper IUD.This type is wrapped in copper wire. It can stay in place for 10 years.  Permanent methods of contraception Female tubal ligation In this method, a woman's fallopian tubes are sealed, tied, or blocked during surgery to prevent eggs from traveling to the uterus. Hysteroscopic sterilization In this method, a small, flexible insert is placed into each fallopian tube. The inserts cause scar tissue to form in the fallopian tubes and block them, so sperm cannot reach an egg. The procedure takes about 3 months to be effective. Another form of birth control must be used during those 3 months. Female sterilization This is a procedure to tie off the tubes that carry sperm (vasectomy). After the procedure, the man can still ejaculate fluid (semen). Natural planning methods Natural family planning In this method, a couple does not have sex on days when the woman could become pregnant. Calendar method This means keeping track of the length of each menstrual cycle, identifying the days when pregnancy can happen, and not having sex on those days. Ovulation method In this method, a couple avoids sex during ovulation. Symptothermal method This method involves not having sex during ovulation. The woman typically checks for ovulation by watching changes in her temperature and in the consistency of cervical mucus. Post-ovulation method In this method, a couple waits to have sex until after ovulation. Summary  Contraception, also called birth control, means methods or devices that prevent pregnancy.  Hormonal methods of contraception include implants, injections, pills, patches, vaginal rings, and emergency contraceptives.  Barrier methods of contraception can include female condoms, female condoms, diaphragms, cervical caps, sponges, and spermicides.  There are two types of IUDs (intrauterine devices). An IUD can be put in a woman's uterus to prevent pregnancy  for 3-5 years.  Permanent sterilization can be done through a procedure for males, females, or both.  Natural family planning methods involve not having sex on days when the woman could become pregnant. This information is not intended to replace advice given to you by your health care provider. Make sure you discuss any questions you have with your health care provider. Document Released: 03/30/2005 Document Revised: 05/02/2016 Document Reviewed: 05/02/2016 Elsevier Interactive Patient Education  2018 [employer]  

## 2018-03-01 ENCOUNTER — Emergency Department (HOSPITAL_COMMUNITY)
Admission: EM | Admit: 2018-03-01 | Discharge: 2018-03-01 | Disposition: A | Discharge status: Home / Self Care | Payer: Medicaid Other | Attending: Emergency Medicine | Admitting: Emergency Medicine

## 2018-03-01 ENCOUNTER — Emergency Department (HOSPITAL_COMMUNITY): Payer: Medicaid Other

## 2018-03-01 ENCOUNTER — Encounter (HOSPITAL_COMMUNITY): Payer: Self-pay

## 2018-03-01 DIAGNOSIS — R509 Fever, unspecified: Secondary | ICD-10-CM

## 2018-03-01 DIAGNOSIS — F17210 Nicotine dependence, cigarettes, uncomplicated: Secondary | ICD-10-CM | POA: Diagnosis not present

## 2018-03-01 DIAGNOSIS — Z79899 Other long term (current) drug therapy: Secondary | ICD-10-CM | POA: Diagnosis not present

## 2018-03-01 DIAGNOSIS — M791 Myalgia, unspecified site: Secondary | ICD-10-CM | POA: Insufficient documentation

## 2018-03-01 DIAGNOSIS — R05 Cough: Secondary | ICD-10-CM | POA: Insufficient documentation

## 2018-03-01 DIAGNOSIS — R52 Pain, unspecified: Secondary | ICD-10-CM

## 2018-03-01 LAB — INFLUENZA PANEL BY PCR (TYPE A & B)
Influenza A By PCR: NEGATIVE
Influenza B By PCR: NEGATIVE

## 2018-03-01 MED ORDER — ACETAMINOPHEN 325 MG PO TABS
650.0000 mg | ORAL_TABLET | Freq: Once | ORAL | Status: AC
  Administered 2018-03-01: 650 mg via ORAL
  Filled 2018-03-01: qty 2

## 2018-03-01 NOTE — ED Provider Notes (Signed)
[facility]   CSN: [id number] Arrival date & time: 03/01/18  1713     History   Chief Complaint No chief complaint on file.   HPI Marissa EaglesLindsay [name] is a 32 y.o. female.  32 y.o female with a PMH of Chronic back pain presents to the ED with a chief complaint of fever, generalized body aches since this morning. Patient reports feeling unwell this morning and having chills, generalized body aches, along with chills. She has tried taking some tylenol for the fever but reports no relieve in symptoms. Patient also reports a productive cough with clear sputum. She denies getting a flu vaccine this season. Patient denies any shortness of breath expect with coughing. She denies any sick contacts.      Past Medical History:  Diagnosis Date  . Back pain, chronic   . Cat allergies   . Depressed bipolar I disorder (HCC)   . Headache   . Seasonal allergies     Patient Active Problem List   Diagnosis Date Noted  . Encounter for IUD insertion 08/14/2015  . Dysmenorrhea 10/31/2012  . Abnormal uterine bleeding (AUB) 10/31/2012    Past Surgical History:  Procedure Laterality Date  . CHOLECYSTECTOMY  2010   lap chole     OB History    Gravida  4   Para  0   Term  0   Preterm  0   AB  1   Living  3     SAB  0   TAB  0   Ectopic  0   Multiple  0   Live Births  3            Home Medications    Prior to Admission medications   Medication Sig Start Date End Date Taking? Authorizing Provider  albuterol (PROVENTIL HFA;VENTOLIN HFA) 108 (90 Base) MCG/ACT inhaler Inhale 1-2 puffs into the lungs every 6 (six) hours as needed for wheezing or shortness of breath. 09/09/17   Cathie HoopsYu, Amy V, PA-C  hydrOXYzine (ATARAX/VISTARIL) 25 MG tablet Take 1 tablet (25 mg total) by mouth every 6 (six) hours as needed for itching. Patient not taking: Reported on 02/21/2018 12/17/17   Antony MaduraHumes, Kelly, PA-C  ibuprofen (ADVIL,MOTRIN) 800 MG tablet  Take 1 tablet (800 mg total) by mouth every 8 (eight) hours as needed. 02/17/18   Brock BadHarper, Charles A, MD  Oxycodone HCl 10 MG TABS Take 1 tablet (10 mg total) by mouth every 6 (six) hours as needed (pain). Patient not taking: Reported on 02/21/2018 12/08/17   Brock BadHarper, Charles A, MD    Family History Family History  Problem Relation Age of Onset  . Heart failure Father     Social History Social History   Tobacco Use  . Smoking status: Light Tobacco Smoker    Packs/day: 0.25    Types: Cigarettes  . Smokeless tobacco: Never Used  Substance Use Topics  . Alcohol use: Yes    Alcohol/week: 0.0 standard drinks    Comment: Once a month  . Drug use: No     Allergies   Darvocet [propoxyphene n-acetaminophen] and Miconazole nitrate   Review of Systems Review of Systems  Constitutional: Positive for chills and fever.  Respiratory: Positive for cough. Negative for shortness of breath.   Cardiovascular: Negative for chest pain.  Gastrointestinal: Negative for abdominal pain, diarrhea, nausea and vomiting.  Genitourinary: Negative for dysuria and flank pain.  Musculoskeletal: Negative for back pain.  Skin: Negative  for pallor and wound.  Neurological: Negative for light-headedness and headaches.     Physical Exam Updated Vital Signs BP 129/64 (BP Location: Right Arm)   Pulse 90   Temp 100 F (37.8 C) (Oral)   Resp 16   LMP 02/11/2018   SpO2 97%   Physical Exam  Constitutional: She is oriented to person, place, and time. She appears well-developed and well-nourished. No distress.  HENT:  Head: Normocephalic and atraumatic.  Mouth/Throat: Oropharynx is clear and moist. No oropharyngeal exudate.  Eyes: Pupils are equal, round, and reactive to light.  Neck: Normal range of motion.  Cardiovascular: Regular rhythm and normal heart sounds.  Pulmonary/Chest: Effort normal and breath sounds normal. No accessory muscle usage. No respiratory distress. She has no decreased breath  sounds. She has no wheezes. She exhibits no tenderness.  Abdominal: Soft. Bowel sounds are normal. She exhibits no distension. There is no tenderness.  Musculoskeletal: She exhibits no tenderness or deformity.       Right lower leg: She exhibits no edema.       Left lower leg: She exhibits no edema.  Neurological: She is alert and oriented to person, place, and time.  Skin: Skin is warm and dry.  Psychiatric: She has a normal mood and affect.  Nursing note and vitals reviewed.    ED Treatments / Results  Labs (all labs ordered are listed, but only abnormal results are displayed) Labs Reviewed  INFLUENZA PANEL BY PCR (TYPE A & B)    EKG None  Radiology No results found.  Procedures Procedures (including critical care time)  Medications Ordered in ED Medications  acetaminophen (TYLENOL) tablet 650 mg (650 mg Oral Given 03/01/18 1742)     Initial Impression / Assessment and Plan / ED Course  I have reviewed the triage vital signs and the nursing notes.  Pertinent labs & imaging results that were available during my care of the patient were reviewed by me and considered in my medical decision making (see chart for details).  She presents with sudden onset of high fever along with generalized body aches.  PCR flu was negative.  Patient did not get the flu shot this season but reports she feels very weak and overall sick.  DG chest x-ray obtained to rule out any pneumonia, it revealed no acute abnormalities at this time.  She is afebrile after receiving some Tylenol for her fever.  I have advised her that she is likely suffering from a viral illness she is to continue to increase her fluid intake along with take Tylenol for the fever and plenty of fluids.  I will provide her with a work note to be excused.  Patient's vitals stable at discharge, patient stable for discharge.  Final Clinical Impressions(s) / ED Diagnoses   Final diagnoses:  Generalized body aches  Fever,  unspecified fever cause    ED Discharge Orders    None       Claude Manges, Cordelia Poche 03/01/18 2051    Pricilla Loveless, MD 03/09/18 1719

## 2018-03-01 NOTE — ED Notes (Signed)
Pt stable, ambulatory, states understanding of discharge instructions 

## 2018-03-01 NOTE — ED Triage Notes (Signed)
Pt c/o flu like symptoms starting last night says she woke up this morning feeling bad

## 2018-03-01 NOTE — ED Notes (Signed)
Patient transported to X-ray 

## 2018-03-01 NOTE — Discharge Instructions (Signed)
Please purchase an expectorant like Mucinex to help loosen and thin the mucus production.You Reasoner also try some over the counter Robitussin to help with your cough. Continue to hydrate with plenty of fluids and Gatorade.If you experience any shortness of breath, chest pain or fever please return to the ED for reevaluation.  ° °

## 2018-03-03 ENCOUNTER — Encounter: Payer: Self-pay | Admitting: Obstetrics

## 2018-03-03 ENCOUNTER — Other Ambulatory Visit (HOSPITAL_COMMUNITY)
Admission: RE | Admit: 2018-03-03 | Discharge: 2018-03-03 | Disposition: A | Discharge status: Home / Self Care | Payer: Medicaid Other | Source: Ambulatory Visit | Attending: Obstetrics | Admitting: Obstetrics

## 2018-03-03 ENCOUNTER — Encounter

## 2018-03-03 ENCOUNTER — Ambulatory Visit: Payer: Medicaid Other | Admitting: Obstetrics

## 2018-03-03 VITALS — BP 128/89 | HR 84 | Ht 65.0 in | Wt 164.2 lb

## 2018-03-03 DIAGNOSIS — N939 Abnormal uterine and vaginal bleeding, unspecified: Secondary | ICD-10-CM

## 2018-03-03 DIAGNOSIS — Z0000 Encounter for general adult medical examination without abnormal findings: Secondary | ICD-10-CM

## 2018-03-03 DIAGNOSIS — Z3046 Encounter for surveillance of implantable subdermal contraceptive: Secondary | ICD-10-CM

## 2018-03-03 DIAGNOSIS — Z01419 Encounter for gynecological examination (general) (routine) without abnormal findings: Secondary | ICD-10-CM | POA: Insufficient documentation

## 2018-03-03 DIAGNOSIS — N943 Premenstrual tension syndrome: Secondary | ICD-10-CM

## 2018-03-03 DIAGNOSIS — N946 Dysmenorrhea, unspecified: Secondary | ICD-10-CM

## 2018-03-03 NOTE — Addendum Note (Signed)
Addended by: Frutoso ChaseOX, Jaia Alonge on: 03/03/2018 03:05 PM   Modules accepted: Orders

## 2018-03-03 NOTE — Addendum Note (Signed)
Addended byFrutoso Chase: [facility], Augustine Leverette on: 03/03/2018 03:42 PM   Modules accepted: Orders

## 2018-03-03 NOTE — Progress Notes (Signed)
Last pap 10/26/16 normal -hpv. Had Nex[facility]on[facility] complained it was causing "chest pain and bad cramps". Pt is still spotting.

## 2018-03-03 NOTE — Progress Notes (Signed)
Subjective:        Marissa Smith is a 32 y.o. female here for a routine exam.  Current complaints: Continued irregular periods and cramping.  Nexplanon removed on 02-21-2018.  She does not want any contraception at this time.  She feels that the hormones are causing her problems.  Personal health questionnaire:  Is patient Ashkenazi Jewish, have a family history of breast and/or ovarian cancer: no Is there a family history of uterine cancer diagnosed at age < 51, gastrointestinal cancer, urinary tract cancer, family member who is a Personnel officer syndrome-associated carrier: no Is the patient overweight and hypertensive, family history of diabetes, personal history of gestational diabetes, preeclampsia or PCOS: no Is patient over 23, have PCOS,  family history of premature CHD under age 52, diabetes, smoke, have hypertension or peripheral artery disease:  no At any time, has a partner hit, kicked or otherwise hurt or frightened you?: no Over the past 2 weeks, have you felt down, depressed or hopeless?: no Over the past 2 weeks, have you felt little interest or pleasure in doing things?:no   Gynecologic History Patient's last menstrual period was 02/21/2018 (approximate). Contraception: none Last Pap: 2018. Results were: normal Last mammogram: n/a. Results were: n/a  Obstetric History OB History  Gravida Para Term Preterm AB Living  4 0 0 0 1 3  SAB TAB Ectopic Multiple Live Births  0 0 0 0 3    # Outcome Date GA Lbr Len/2nd Weight Sex Delivery Anes PTL Lv  4 AB           3 Gravida         LIV  2 Gravida         LIV  1 Gravida         LIV    Past Medical History:  Diagnosis Date  . Back pain, chronic   . Cat allergies   . Depressed bipolar I disorder (HCC)   . Headache   . Seasonal allergies     Past Surgical History:  Procedure Laterality Date  . CHOLECYSTECTOMY  2010   lap chole     Current Outpatient Medications:  .  albuterol (PROVENTIL HFA;VENTOLIN HFA) 108 (90  Base) MCG/ACT inhaler, Inhale 1-2 puffs into the lungs every 6 (six) hours as needed for wheezing or shortness of breath., Disp: 1 Inhaler, Rfl: 0 .  ibuprofen (ADVIL,MOTRIN) 800 MG tablet, Take 1 tablet (800 mg total) by mouth every 8 (eight) hours as needed., Disp: 30 tablet, Rfl: 0 .  hydrOXYzine (ATARAX/VISTARIL) 25 MG tablet, Take 1 tablet (25 mg total) by mouth every 6 (six) hours as needed for itching. (Patient not taking: Reported on 02/21/2018), Disp: 12 tablet, Rfl: 0 .  Oxycodone HCl 10 MG TABS, Take 1 tablet (10 mg total) by mouth every 6 (six) hours as needed (pain). (Patient not taking: Reported on 02/21/2018), Disp: 20 tablet, Rfl: 0 Allergies  Allergen Reactions  . Darvocet [Propoxyphene N-Acetaminophen] Nausea And Vomiting and Other (See Comments)    Reaction:  Dizziness   . Miconazole Nitrate Swelling and Rash    Social History   Tobacco Use  . Smoking status: Light Tobacco Smoker    Packs/day: 0.25    Types: Cigarettes  . Smokeless tobacco: Never Used  Substance Use Topics  . Alcohol use: Yes    Alcohol/week: 0.0 standard drinks    Comment: Once a month    Family History  Problem Relation Age of Onset  . Heart failure  Father       Review of Systems  Constitutional: negative for fatigue and weight loss Respiratory: negative for cough and wheezing Cardiovascular: negative for chest pain, fatigue and palpitations Gastrointestinal: negative for abdominal pain and change in bowel habits Musculoskeletal:negative for myalgias Neurological: negative for gait problems and tremors Behavioral/Psych: negative for abusive relationship, depression Endocrine: negative for temperature intolerance    Genitourinary:negative for abnormal menstrual periods, genital lesions, hot flashes, sexual problems and vaginal discharge Integument/breast: negative for breast lump, breast tenderness, nipple discharge and skin lesion(s)    Objective:       BP 128/89   Pulse 84   Ht 5'  5" (1.651 m)   Wt 164 lb 3.2 oz (74.5 kg)   LMP 02/21/2018 (Approximate)   BMI 27.32 kg/m  General:   alert  Skin:   no rash or abnormalities  Lungs:   clear to auscultation bilaterally  Heart:   regular rate and rhythm, S1, S2 normal, no murmur, click, rub or gallop  Breasts:   normal without suspicious masses, skin or nipple changes or axillary nodes  Abdomen:  normal findings: no organomegaly, soft, non-tender and no hernia  Pelvis:  External genitalia: normal general appearance Urinary system: urethral meatus normal and bladder without fullness, nontender Vaginal: normal without tenderness, induration or masses Cervix: normal appearance Adnexa: normal bimanual exam Uterus: anteverted and non-tender, normal size   Lab Review Urine pregnancy test Labs reviewed yes Radiologic studies reviewed no  50% of 20 min visit spent on counseling and coordination of care.   Assessment:     1. Encounter for routine gynecological examination with Papanicolaou smear of cervix Rx: - Cervicovaginal ancillary only - Hepatitis B surface antigen - Hepatitis C antibody - HIV Antibody (routine testing w rflx) - RPR - Cytology - PAP  2. Abnormal uterine bleeding (AUB) - will monitor cycles after Nexplanon removal  3. Dysmenorrhea - Ibuprofen prn  4. PMS (premenstrual syndrome) - recommended SSRI, but she refused  5. Encounter for surveillance of Nexplanon subdermal contraceptive - Nexplanon removed    Plan:    Education reviewed: calcium supplements, depression evaluation, low fat, low cholesterol diet, safe sex/STD prevention, self breast exams, smoking cessation and weight bearing exercise. Contraception: none. Follow up in: 6 months.   No orders of the defined types were placed in this encounter.  Orders Placed This Encounter  Procedures  . Hepatitis B surface antigen  . Hepatitis C antibody  . HIV Antibody (routine testing w rflx)  . RPR    Brock BadHARLES A. HARPER  MD 03-03-2018

## 2018-03-04 LAB — CERVICOVAGINAL ANCILLARY ONLY
Bacterial vaginitis: POSITIVE — AB
Candida vaginitis: NEGATIVE
Chlamydia: NEGATIVE
Neisseria Gonorrhea: NEGATIVE
Trichomonas: NEGATIVE

## 2018-03-05 ENCOUNTER — Other Ambulatory Visit: Payer: Self-pay | Admitting: Obstetrics

## 2018-03-05 DIAGNOSIS — N760 Acute vaginitis: Secondary | ICD-10-CM

## 2018-03-05 DIAGNOSIS — B9689 Other specified bacterial agents as the cause of diseases classified elsewhere: Secondary | ICD-10-CM

## 2018-03-05 MED ORDER — TINIDAZOLE 500 MG PO TABS: 1000.0000 mg | ORAL_TABLET | Freq: Every day | ORAL | 2 refills | Status: DC

## 2018-03-08 LAB — CYTOLOGY - PAP
Diagnosis: NEGATIVE
HPV: NOT DETECTED

## 2018-03-14 ENCOUNTER — Other Ambulatory Visit: Payer: Self-pay

## 2018-03-14 NOTE — Progress Notes (Signed)
PA completed and approved on 03-14-18 for Tinidazole 500 mg  Con # [id number] J[place]#[id number]  Approval info faxed to pt pharmacy .

## 2018-04-10 ENCOUNTER — Ambulatory Visit (HOSPITAL_COMMUNITY)
Admission: EM | Admit: 2018-04-10 | Discharge: 2018-04-10 | Disposition: A | Discharge status: Home / Self Care | Payer: Medicaid Other | Attending: Family Medicine | Admitting: Family Medicine

## 2018-04-10 ENCOUNTER — Other Ambulatory Visit: Payer: Self-pay

## 2018-04-10 ENCOUNTER — Encounter (HOSPITAL_COMMUNITY): Payer: Self-pay | Admitting: Emergency Medicine

## 2018-04-10 DIAGNOSIS — J209 Acute bronchitis, unspecified: Secondary | ICD-10-CM | POA: Diagnosis not present

## 2018-04-10 MED ORDER — ALBUTEROL SULFATE HFA 108 (90 BASE) MCG/ACT IN AERS: 1.0000 | INHALATION_SPRAY | Freq: Four times a day (QID) | RESPIRATORY_TRACT | 0 refills | Status: DC | PRN

## 2018-04-10 MED ORDER — IPRATROPIUM-ALBUTEROL 0.5-2.5 (3) MG/3ML IN SOLN
RESPIRATORY_TRACT | Status: AC
  Filled 2018-04-10: qty 3

## 2018-04-10 MED ORDER — AZITHROMYCIN 250 MG PO TABS: 250.0000 mg | ORAL_TABLET | Freq: Every day | ORAL | 0 refills | Status: DC

## 2018-04-10 MED ORDER — IPRATROPIUM-ALBUTEROL 0.5-2.5 (3) MG/3ML IN SOLN
3.0000 mL | Freq: Once | RESPIRATORY_TRACT | Status: AC
  Administered 2018-04-10: 3 mL via RESPIRATORY_TRACT

## 2018-04-10 MED ORDER — PREDNISONE 50 MG PO TABS: 50.0000 mg | ORAL_TABLET | Freq: Every day | ORAL | 0 refills | Status: DC

## 2018-04-10 NOTE — Discharge Instructions (Addendum)
Return if any problems.

## 2018-04-10 NOTE — ED Triage Notes (Signed)
The patient presented to the [facility] with a complaint of a cough and fever x 3 days.  

## 2018-04-11 NOTE — ED Provider Notes (Signed)
[facility]    CSN: [id number] Arrival date & time: 04/10/18  1652     History   Chief Complaint Chief Complaint  Patient presents with  . Cough    HPI Marissa Smith is a 32 y.o. female.   The history is provided by the patient. No language interpreter was used.  Cough  Cough characteristics:  Productive Sputum characteristics:  Nondescript Severity:  Moderate Onset quality:  Gradual Timing:  Constant Progression:  Worsening Chronicity:  New Relieved by:  Nothing Worsened by:  Nothing Ineffective treatments:  None tried Associated symptoms: shortness of breath     Past Medical History:  Diagnosis Date  . Back pain, chronic   . Cat allergies   . Depressed bipolar I disorder (HCC)   . Headache   . Seasonal allergies     Patient Active Problem List   Diagnosis Date Noted  . Encounter for IUD insertion 08/14/2015  . Dysmenorrhea 10/31/2012  . Abnormal uterine bleeding (AUB) 10/31/2012    Past Surgical History:  Procedure Laterality Date  . CHOLECYSTECTOMY  2010   lap chole    OB History    Gravida  4   Para  0   Term  0   Preterm  0   AB  1   Living  3     SAB  0   TAB  0   Ectopic  0   Multiple  0   Live Births  3            Home Medications    Prior to Admission medications   Medication Sig Start Date End Date Taking? Authorizing Provider  acetaminophen (TYLENOL) 325 MG tablet Take 650 mg by mouth every 6 (six) hours as needed.   Yes [provider]  ibuprofen (ADVIL,MOTRIN) 800 MG tablet Take 1 tablet (800 mg total) by mouth every 8 (eight) hours as needed. 02/17/18  Yes Brock BadHarper, Charles A, MD  albuterol (PROVENTIL HFA;VENTOLIN HFA) 108 (90 Base) MCG/ACT inhaler Inhale 1-2 puffs into the lungs every 6 (six) hours as needed for wheezing or shortness of breath. 04/10/18   Elson AreasSofia, Leslie K, PA-C  azithromycin (ZITHROMAX) 250 MG tablet Take 1 tablet (250 mg total) by mouth daily. Take first 2 tablets together,  then 1 every day until finished. 04/10/18   Elson AreasSofia, Leslie K, PA-C  predniSONE (DELTASONE) 50 MG tablet Take 1 tablet (50 mg total) by mouth daily. 04/10/18   Elson AreasSofia, Leslie K, PA-C    Family History Family History  Problem Relation Age of Onset  . Heart failure Father     Social History Social History   Tobacco Use  . Smoking status: Light Tobacco Smoker    Packs/day: 0.25    Types: Cigarettes  . Smokeless tobacco: Never Used  Substance Use Topics  . Alcohol use: Yes    Alcohol/week: 0.0 standard drinks    Comment: Once a month  . Drug use: No     Allergies   Darvocet [propoxyphene n-acetaminophen] and Miconazole nitrate   Review of Systems Review of Systems  Respiratory: Positive for cough and shortness of breath.   All other systems reviewed and are negative.    Physical Exam Triage Vital Signs ED Triage Vitals  Enc Vitals Group     BP 04/10/18 1755 (!) 107/49     Pulse Rate 04/10/18 1755 78     Resp 04/10/18 1755 16     Temp 04/10/18 1755 (!) 101 F (38.3 C)  Temp Source 04/10/18 1755 Oral     SpO2 04/10/18 1755 100 %     Weight --      Height --      Head Circumference --      Peak Flow --      Pain Score 04/10/18 1800 9     Pain Loc --      Pain Edu? --      Excl. in GC? --    No data found.  Updated Vital Signs BP (!) 107/49 (BP Location: Left Arm)   Pulse 78   Temp (!) 101 F (38.3 C) (Oral)   Resp 16   LMP 03/26/2018 (LMP Unknown)   SpO2 100%   Visual Acuity Right Eye Distance:   Left Eye Distance:   Bilateral Distance:    Right Eye Near:   Left Eye Near:    Bilateral Near:     Physical Exam Vitals signs and nursing note reviewed.  Constitutional:      Appearance: She is well-developed.  HENT:     Head: Normocephalic.     Right Ear: Tympanic membrane normal.     Left Ear: Tympanic membrane normal.     Mouth/Throat:     Mouth: Mucous membranes are moist.  Eyes:     Pupils: Pupils are equal, round, and reactive to light.    Neck:     Musculoskeletal: Normal range of motion.  Pulmonary:     Effort: Pulmonary effort is normal.     Breath sounds: Wheezing and rhonchi present.  Abdominal:     General: There is no distension.  Musculoskeletal: Normal range of motion.  Neurological:     Mental Status: She is alert and oriented to person, place, and time.  Psychiatric:        Mood and Affect: Mood normal.      UC Treatments / Results  Labs (all labs ordered are listed, but only abnormal results are displayed) Labs Reviewed - No data to display  EKG None  Radiology No results found.  Procedures Procedures (including critical care time)  Medications Ordered in UC Medications  ipratropium-albuterol (DUONEB) 0.5-2.5 (3) MG/3ML nebulizer solution 3 mL (3 mLs Nebulization Given 04/10/18 1819)    Initial Impression / Assessment and Plan / UC Course  I have reviewed the triage vital signs and the nursing notes.  Pertinent labs & imaging results that were available during my care of the patient were reviewed by me and considered in my medical decision making (see chart for details).      Final Clinical Impressions(s) / UC Diagnoses   Final diagnoses:  Acute bronchitis, unspecified organism     Discharge Instructions     Return if any problems.     ED Prescriptions    Medication Sig Dispense Auth. Provider   predniSONE (DELTASONE) 50 MG tablet Take 1 tablet (50 mg total) by mouth daily. 4 tablet Sofia, Leslie K, PA-C   albuterol (PROVENTIL HFA;VENTOLIN HFA) 108 (90 Base) MCG/ACT inhaler Inhale 1-2 puffs into the lungs every 6 (six) hours as needed for wheezing or shortness of breath. 1 Inhaler Sofia, Leslie K, PA-C   azithromycin (ZITHROMAX) 250 MG tablet Take 1 tablet (250 mg total) by mouth daily. Take first 2 tablets together, then 1 every day until finished. 6 tablet Elson Areas, [place]     Controlled Substance Prescriptions [place] Controlled Substance Registry consulted? Not Applicable   An After Visit Summary was printed and given to the patient.  Elson AreasSofia, Leslie K, [place] 04/11/18 1610

## 2018-06-06 ENCOUNTER — Encounter (HOSPITAL_COMMUNITY): Payer: Self-pay

## 2018-06-06 ENCOUNTER — Emergency Department (HOSPITAL_COMMUNITY): Payer: Medicaid Other

## 2018-06-06 ENCOUNTER — Emergency Department (HOSPITAL_COMMUNITY)
Admission: EM | Admit: 2018-06-06 | Discharge: 2018-06-06 | Disposition: A | Discharge status: Home / Self Care | Payer: Medicaid Other | Attending: Emergency Medicine | Admitting: Emergency Medicine

## 2018-06-06 DIAGNOSIS — Z79899 Other long term (current) drug therapy: Secondary | ICD-10-CM | POA: Diagnosis not present

## 2018-06-06 DIAGNOSIS — F17210 Nicotine dependence, cigarettes, uncomplicated: Secondary | ICD-10-CM | POA: Diagnosis not present

## 2018-06-06 DIAGNOSIS — K6289 Other specified diseases of anus and rectum: Secondary | ICD-10-CM | POA: Diagnosis not present

## 2018-06-06 LAB — CBC WITH DIFFERENTIAL/PLATELET
Abs Immature Granulocytes: 0.01 10*3/uL (ref 0.00–0.07)
Basophils Absolute: 0 10*3/uL (ref 0.0–0.1)
Basophils Relative: 1 %
Eosinophils Absolute: 0.1 10*3/uL (ref 0.0–0.5)
Eosinophils Relative: 2 %
HCT: 41.9 % (ref 36.0–46.0)
Hemoglobin: 13.9 g/dL (ref 12.0–15.0)
Immature Granulocytes: 0 %
Lymphocytes Relative: 26 %
Lymphs Abs: 1.3 10*3/uL (ref 0.7–4.0)
MCH: 31.1 pg (ref 26.0–34.0)
MCHC: 33.2 g/dL (ref 30.0–36.0)
MCV: 93.7 fL (ref 80.0–100.0)
Monocytes Absolute: 0.4 10*3/uL (ref 0.1–1.0)
Monocytes Relative: 8 %
Neutro Abs: 3.3 10*3/uL (ref 1.7–7.7)
Neutrophils Relative %: 63 %
Platelets: 219 10*3/uL (ref 150–400)
RBC: 4.47 MIL/uL (ref 3.87–5.11)
RDW: 13.4 % (ref 11.5–15.5)
WBC: 5.1 10*3/uL (ref 4.0–10.5)
nRBC: 0 % (ref 0.0–0.2)

## 2018-06-06 LAB — BASIC METABOLIC PANEL
Anion gap: 6 (ref 5–15)
BUN: 11 mg/dL (ref 6–20)
CO2: 23 mmol/L (ref 22–32)
Calcium: 8.9 mg/dL (ref 8.9–10.3)
Chloride: 107 mmol/L (ref 98–111)
Creatinine, Ser: 0.71 mg/dL (ref 0.44–1.00)
GFR calc Af Amer: >60 mL/min (ref >60)
GFR calc non Af Amer: >60 mL/min (ref >60)
Glucose, Bld: 94 mg/dL (ref 70–99)
Potassium: 3.9 mmol/L (ref 3.5–5.1)
Sodium: 136 mmol/L (ref 135–145)

## 2018-06-06 LAB — I-STAT BETA HCG BLOOD, ED (MC, WL, AP ONLY): I-stat hCG, quantitative: <5 m[IU]/mL (ref <5)

## 2018-06-06 MED ORDER — IOHEXOL 300 MG/ML  SOLN
100.0000 mL | Freq: Once | INTRAMUSCULAR | Status: AC | PRN
  Administered 2018-06-06: 100 mL via INTRAVENOUS

## 2018-06-06 MED ORDER — OXYCODONE-ACETAMINOPHEN 5-325 MG PO TABS
1.0000 | ORAL_TABLET | Freq: Once | ORAL | Status: AC
  Administered 2018-06-06: 1 via ORAL
  Filled 2018-06-06: qty 1

## 2018-06-06 NOTE — ED Provider Notes (Signed)
[facility] EMERGENCY DEPARTMENT Provider Note   CSN: [id number] Arrival date & time: 06/06/18  1112    History   Chief Complaint Chief Complaint  Patient presents with  . Rectal Pain    HPI Marissa Smith is a 33 y.o. female.      HPI    33 year old female presents today with complaints of rectal pain.  Patient notes approximately 1 week ago she developed pain in the rectal region.  She notes when she has a bowel movement she feels as if something is poking out.  Patient denies any external hemorrhoids.  She has a history hemorrhoids remotely but cannot recall how that felt.  She denies any fever chills nausea or vomiting.  She denies any hard bowel movements but notes pain with having a bowel movement.  She has been using Anusol and warm soaks.  She notes warm soaks have improved her symptoms.  Ibuprofen has not.   Past Medical History:  Diagnosis Date  . Back pain, chronic   . Cat allergies   . Depressed bipolar I disorder (HCC)   . Headache   . Seasonal allergies     Patient Active Problem List   Diagnosis Date Noted  . Encounter for IUD insertion 08/14/2015  . Dysmenorrhea 10/31/2012  . Abnormal uterine bleeding (AUB) 10/31/2012    Past Surgical History:  Procedure Laterality Date  . CHOLECYSTECTOMY  2010   lap chole     OB History    Gravida  4   Para  0   Term  0   Preterm  0   AB  1   Living  3     SAB  0   TAB  0   Ectopic  0   Multiple  0   Live Births  3            Home Medications    Prior to Admission medications   Medication Sig Start Date End Date Taking? Authorizing Provider  acetaminophen (TYLENOL) 325 MG tablet Take 650 mg by mouth every 6 (six) hours as needed.   Yes [provider]  albuterol (PROVENTIL HFA;VENTOLIN HFA) 108 (90 Base) MCG/ACT inhaler Inhale 1-2 puffs into the lungs every 6 (six) hours as needed for wheezing or shortness of breath. 04/10/18  Yes Cheron Schaumann K, PA-C    ibuprofen (ADVIL,MOTRIN) 800 MG tablet Take 1 tablet (800 mg total) by mouth every 8 (eight) hours as needed. 02/17/18  Yes Brock Bad, MD  Multiple Vitamin (MULTIVITAMIN WITH MINERALS) TABS tablet Take 2 tablets by mouth daily.   Yes [provider]  phenylephrine-shark liver oil-mineral oil-petrolatum (PREPARATION H) 0.25-14-74.9 % rectal ointment Place 1 application rectally 2 (two) times daily as needed for hemorrhoids.   Yes [provider]  witch hazel-glycerin (TUCKS) pad Apply 1 application topically as needed for itching.   Yes [provider]  azithromycin (ZITHROMAX) 250 MG tablet Take 1 tablet (250 mg total) by mouth daily. Take first 2 tablets together, then 1 every day until finished. Patient not taking: Reported on 06/06/2018 04/10/18   Elson Areas, PA-C  predniSONE (DELTASONE) 50 MG tablet Take 1 tablet (50 mg total) by mouth daily. Patient not taking: Reported on 06/06/2018 04/10/18   Osie Cheeks    Family History Family History  Problem Relation Age of Onset  . Heart failure Father     Social History Social History   Tobacco Use  . Smoking status:  Light Tobacco Smoker    Packs/day: 0.25    Types: Cigarettes  . Smokeless tobacco: Never Used  Substance Use Topics  . Alcohol use: Yes    Alcohol/week: 0.0 standard drinks    Comment: Once a month  . Drug use: No     Allergies   Darvocet [propoxyphene n-acetaminophen] and Miconazole nitrate   Review of Systems Review of Systems  All other systems reviewed and are negative.   Physical Exam Updated Vital Signs BP 117/71 (BP Location: Right Arm)   Pulse 80   Temp 98 F (36.7 C) (Oral)   Resp 16   LMP 05/17/2018   SpO2 100%   Physical Exam Vitals signs and nursing note reviewed.  Constitutional:      Appearance: She is well-developed.  HENT:     Head: Normocephalic and atraumatic.  Eyes:     General: No scleral icterus.       Right eye: No discharge.         Left eye: No discharge.     Conjunctiva/sclera: Conjunctivae normal.     Pupils: Pupils are equal, round, and reactive to light.  Neck:     Musculoskeletal: Normal range of motion.     Vascular: No JVD.     Trachea: No tracheal deviation.  Pulmonary:     Effort: Pulmonary effort is normal.     Breath sounds: No stridor.  Genitourinary:    Comments: Tenderness palpation of the rectum at the perineal region, no fluctuance induration-unable to complete internal exam secondary to pain, no external hemorrhoids, no erythema Neurological:     Mental Status: She is alert and oriented to person, place, and time.     Coordination: Coordination normal.  Psychiatric:        Behavior: Behavior normal.        Thought Content: Thought content normal.        Judgment: Judgment normal.      ED Treatments / Results  Labs (all labs ordered are listed, but only abnormal results are displayed) Labs Reviewed  CBC WITH DIFFERENTIAL/PLATELET  BASIC METABOLIC PANEL  I-STAT BETA HCG BLOOD, ED ([facility], [facility], [facility] ONLY)    EKG None  Radiology Ct Abdomen Pelvis W Contrast  Result Date: 06/06/2018 CLINICAL DATA:  Rectal pain for the past week. EXAM: CT ABDOMEN AND PELVIS WITH CONTRAST TECHNIQUE: Multidetector CT imaging of the abdomen and pelvis was performed using the standard protocol following bolus administration of intravenous contrast. CONTRAST:  OMNIPAQUE IOHEXOL 300 MG/ML  SOLN COMPARISON:  CT abdomen pelvis dated May 24, 2015. FINDINGS: Lower chest: No acute abnormality. Hepatobiliary: No focal liver abnormality is seen. Status post cholecystectomy. No biliary dilatation. Pancreas: Unremarkable. No pancreatic ductal dilatation or surrounding inflammatory changes. Spleen: Normal in size without focal abnormality. Adrenals/Urinary Tract: Adrenal glands are unremarkable. Kidneys are normal, without renal calculi, focal lesion, or hydronephrosis. Bladder is unremarkable. Stomach/Bowel: Stomach  is within normal limits. Appendix appears normal. No evidence of bowel wall thickening, distention, or inflammatory changes. Vascular/Lymphatic: No significant vascular findings are present. No enlarged abdominal or pelvic lymph nodes. Reproductive: Uterus and bilateral adnexa are unremarkable. Left corpus luteum. Other: Trace free fluid in the pelvis, likely physiologic. Unchanged tiny fat containing umbilical hernia. No pneumoperitoneum. Musculoskeletal: No acute or significant osseous findings. 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 IMPRESSION: 1.  No acute intra-abdominal process.  No perianal abscess. Electronically Signed   By: Obie Dredge M.D.   On: 06/06/2018 12:45  Procedures Procedures (including critical care time)  Medications Ordered in ED Medications  oxyCODONE-acetaminophen (PERCOCET/ROXICET) 5-325 MG per tablet 1 tablet (has no administration in time range)  iohexol (OMNIPAQUE) 300 MG/ML solution 100 mL (100 mLs Intravenous Contrast Given 06/06/18 1226)     Initial Impression / Assessment and Plan / ED Course  I have reviewed the triage vital signs and the nursing notes.  Pertinent labs & imaging results that were available during my care of the patient were reviewed by me and considered in my medical decision making (see chart for details).        Labs: CBC, BMP  Imaging: CT abdomen and pelvis with contrast  Consults:  Therapeutics:  Discharge Meds:   Assessment/Plan: 33 year old female presents today with complaints of rectal pain.  She has no objective findings on visual examination, unable to complete general rectal exam secondary to discomfort.  Patient has reassuring work-up here including CT with no signs of infection no elevation in white count and no fever.  Patient will instructed to continue warm water soaks, Anusol, Tylenol ibuprofen and follow-up with gastroenterology if symptoms persist.  Strict return precautions  given.  She verbalized understanding and agreement to today's plan.    Final Clinical Impressions(s) / ED Diagnoses   Final diagnoses:  None    ED Discharge Orders    None       Rosalio Loud 06/06/18 1307    Geoffery Lyons, MD 06/06/18 [phone]

## 2018-06-06 NOTE — ED Notes (Signed)
Patient transported to CT 

## 2018-06-06 NOTE — Discharge Instructions (Signed)
Please read attached information. If you experience any new or worsening signs or symptoms please return to the emergency room for evaluation. Please follow-up with your primary care provider or specialist as discussed.  °

## 2018-06-06 NOTE — ED Triage Notes (Signed)
Pt presents for evaluation of rectal pain and pelvic pain x 1 week. Reports thought she had hemorrhoid but nothing OTC is working.

## 2018-06-06 NOTE — ED Notes (Signed)
RN went to discharge pt. Pt not in room to get vitals or review paperwork.

## 2018-06-07 ENCOUNTER — Ambulatory Visit: Payer: Medicaid Other | Admitting: Obstetrics

## 2018-07-08 ENCOUNTER — Telehealth: Payer: Self-pay

## 2018-07-08 ENCOUNTER — Other Ambulatory Visit: Payer: Self-pay | Admitting: Obstetrics

## 2018-07-08 DIAGNOSIS — R102 Pelvic and perineal pain: Secondary | ICD-10-CM

## 2018-07-08 MED ORDER — IBUPROFEN 800 MG PO TABS: 800.0000 mg | ORAL_TABLET | Freq: Three times a day (TID) | ORAL | 6 refills | Status: DC | PRN

## 2018-07-08 NOTE — Telephone Encounter (Signed)
TC from patient requesting refill on IBP 800mg  Pt states cycle has started and she has no refills.

## 2018-07-08 NOTE — Telephone Encounter (Signed)
Ibuprofen Rx. 

## 2018-10-17 ENCOUNTER — Telehealth: Payer: Self-pay

## 2018-10-17 NOTE — Telephone Encounter (Signed)
Return call to pt regarding severe pain w/ cycles Pt states she has took 2 IBP 800mg  and still no relief. Pt fearful to go hospital due to COVID-19 I advised pt to take safety measures wearing PPE and social distancing  Pt advised to make appt  Pt info given to scheduling to make appt  Pt voiced understanding,  Per front desk pt did not answer for appt.

## 2018-10-18 ENCOUNTER — Ambulatory Visit: Payer: Medicaid Other | Admitting: Obstetrics

## 2018-10-18 ENCOUNTER — Encounter: Payer: Self-pay | Admitting: Obstetrics

## 2018-10-18 ENCOUNTER — Other Ambulatory Visit: Payer: Self-pay

## 2018-10-18 ENCOUNTER — Other Ambulatory Visit (HOSPITAL_COMMUNITY)
Admission: RE | Admit: 2018-10-18 | Discharge: 2018-10-18 | Disposition: A | Discharge status: Home / Self Care | Payer: Medicaid Other | Source: Ambulatory Visit | Attending: Obstetrics | Admitting: Obstetrics

## 2018-10-18 VITALS — BP 114/76 | HR 63 | Wt 172.0 lb

## 2018-10-18 DIAGNOSIS — N943 Premenstrual tension syndrome: Secondary | ICD-10-CM | POA: Diagnosis not present

## 2018-10-18 DIAGNOSIS — Z3009 Encounter for other general counseling and advice on contraception: Secondary | ICD-10-CM

## 2018-10-18 DIAGNOSIS — N946 Dysmenorrhea, unspecified: Secondary | ICD-10-CM

## 2018-10-18 DIAGNOSIS — Z0000 Encounter for general adult medical examination without abnormal findings: Secondary | ICD-10-CM

## 2018-10-18 DIAGNOSIS — Z30013 Encounter for initial prescription of injectable contraceptive: Secondary | ICD-10-CM

## 2018-10-18 DIAGNOSIS — N898 Other specified noninflammatory disorders of vagina: Secondary | ICD-10-CM

## 2018-10-18 DIAGNOSIS — R102 Pelvic and perineal pain: Secondary | ICD-10-CM

## 2018-10-18 DIAGNOSIS — N939 Abnormal uterine and vaginal bleeding, unspecified: Secondary | ICD-10-CM | POA: Diagnosis not present

## 2018-10-18 DIAGNOSIS — R110 Nausea: Secondary | ICD-10-CM

## 2018-10-18 MED ORDER — OXYCODONE-ACETAMINOPHEN 10-325 MG PO TABS: 1.0000 | ORAL_TABLET | ORAL | 0 refills | Status: DC | PRN

## 2018-10-18 MED ORDER — IBUPROFEN 800 MG PO TABS: 800.0000 mg | ORAL_TABLET | Freq: Three times a day (TID) | ORAL | 6 refills | Status: DC | PRN

## 2018-10-18 MED ORDER — CITRANATAL HARMONY 27-1-260 MG PO CAPS: 1.0000 | ORAL_CAPSULE | Freq: Every day | ORAL | 11 refills | Status: DC

## 2018-10-18 MED ORDER — MEDROXYPROGESTERONE ACETATE 150 MG/ML IM SUSP
150.0000 mg | Freq: Once | INTRAMUSCULAR | Status: AC
  Administered 2018-10-18: 150 mg via INTRAMUSCULAR

## 2018-10-18 MED ORDER — PROMETHAZINE HCL 25 MG PO TABS: 25.0000 mg | ORAL_TABLET | Freq: Four times a day (QID) | ORAL | 2 refills | Status: DC | PRN

## 2018-10-18 MED ORDER — PAROXETINE HCL 20 MG PO TABS: 20.0000 mg | ORAL_TABLET | Freq: Every day | ORAL | 11 refills | Status: DC

## 2018-10-18 NOTE — Progress Notes (Signed)
Patient ID: Marissa EaglesLindsay O Riss, female   DOB: 08/29/1985, 33 y.o.   MRN: [id number]  Chief Complaint  Patient presents with  . Gynecologic Exam    HPI Marissa Smith is a 33 y.o. female.  Severe cramping and heavy bleeding with periods, associated with mood swings, anxiety and anger spells.  She takes Ibuprofen, but gets very little relief. HPI  Past Medical History:  Diagnosis Date  . Back pain, chronic   . Cat allergies   . Depressed bipolar I disorder (HCC)   . Headache   . Seasonal allergies     Past Surgical History:  Procedure Laterality Date  . CHOLECYSTECTOMY  2010   lap chole    Family History  Problem Relation Age of Onset  . Heart failure Father     Social History Social History   Tobacco Use  . Smoking status: Light Tobacco Smoker    Packs/day: 0.25    Types: Cigarettes  . Smokeless tobacco: Never Used  Substance Use Topics  . Alcohol use: Yes    Alcohol/week: 0.0 standard drinks    Comment: Once a month  . Drug use: No    Allergies  Allergen Reactions  . Darvocet [Propoxyphene N-Acetaminophen] Nausea And Vomiting and Other (See Comments)    Reaction:  Dizziness   . Miconazole Nitrate Swelling and Rash    Current Outpatient Medications  Medication Sig Dispense Refill  . ibuprofen (ADVIL) 800 MG tablet Take 1 tablet (800 mg total) by mouth every 8 (eight) hours as needed. 30 tablet 6  . acetaminophen (TYLENOL) 325 MG tablet Take 650 mg by mouth every 6 (six) hours as needed.    Marland Kitchen. albuterol (PROVENTIL HFA;VENTOLIN HFA) 108 (90 Base) MCG/ACT inhaler Inhale 1-2 puffs into the lungs every 6 (six) hours as needed for wheezing or shortness of breath. 1 Inhaler 0  . azithromycin (ZITHROMAX) 250 MG tablet Take 1 tablet (250 mg total) by mouth daily. Take first 2 tablets together, then 1 every day until finished. (Patient not taking: Reported on 06/06/2018) 6 tablet 0  . Multiple Vitamin (MULTIVITAMIN WITH MINERALS) TABS tablet Take 2 tablets by mouth daily.    Marland Kitchen.  oxyCODONE-acetaminophen (PERCOCET) 10-325 MG tablet Take 1 tablet by mouth every 4 (four) hours as needed for pain. 30 tablet 0  . PARoxetine (PAXIL) 20 MG tablet Take 1 tablet (20 mg total) by mouth daily. 30 tablet 11  . phenylephrine-shark liver oil-mineral oil-petrolatum (PREPARATION H) 0.25-14-74.9 % rectal ointment Place 1 application rectally 2 (two) times daily as needed for hemorrhoids.    . predniSONE (DELTASONE) 50 MG tablet Take 1 tablet (50 mg total) by mouth daily. (Patient not taking: Reported on 06/06/2018) 4 tablet 0  . Prenat-FeFmCb-DSS-FA-DHA w/o A (CITRANATAL HARMONY) 27-1-260 MG CAPS Take 1 capsule by mouth daily. 30 capsule 11  . promethazine (PHENERGAN) 25 MG tablet Take 1 tablet (25 mg total) by mouth every 6 (six) hours as needed for nausea. 30 tablet 2  . witch hazel-glycerin (TUCKS) pad Apply 1 application topically as needed for itching.     No current facility-administered medications for this visit.     Review of Systems Review of Systems Constitutional: negative for fatigue and weight loss Respiratory: negative for cough and wheezing Cardiovascular: negative for chest pain, fatigue and palpitations Gastrointestinal: negative for abdominal pain and change in bowel habits Genitourinary:negative Integument/breast: negative for nipple discharge Musculoskeletal:negative for myalgias Neurological: negative for gait problems and tremors Behavioral/Psych: negative for abusive relationship, depression Endocrine: negative  for temperature intolerance      Blood pressure 114/76, pulse 63, weight 172 lb (78 kg), last menstrual period 10/17/2018.  Physical Exam Physical Exam General:   alert  Skin:   no rash or abnormalities  Lungs:   clear to auscultation bilaterally  Heart:   regular rate and rhythm, S1, S2 normal, no murmur, click, rub or gallop  Breasts:   normal without suspicious masses, skin or nipple changes or axillary nodes  Abdomen:  normal findings: no  organomegaly, soft, non-tender and no hernia  Pelvis:  External genitalia: normal general appearance Urinary system: urethral meatus normal and bladder without fullness, nontender Vaginal: normal without tenderness, induration or masses Cervix: normal appearance Adnexa: normal bimanual exam Uterus: anteverted and non-tender, normal size    50% of 20 min visit spent on counseling and coordination of care.   Data Reviewed Medication history Contraceptive histoty  Assessment and Plan:    1. Dysmenorrhea, severe Rx: - oxyCODONE-acetaminophen (PERCOCET) 10-325 MG tablet; Take 1 tablet by mouth every 4 (four) hours as needed for pain.  Dispense: 30 tablet; Refill: 0 - medroxyPROGESTERone (DEPO-PROVERA) injection 150 mg  2. Abnormal uterine bleeding (AUB) Rx: - medroxyPROGESTERone (DEPO-PROVERA) injection 150 mg  3. PMS (premenstrual syndrome) Rx: - PARoxetine (PAXIL) 20 MG tablet; Take 1 tablet (20 mg total) by mouth daily.  Dispense: 30 tablet; Refill: 11  4. Vaginal discharge Rx: - Cervicovaginal ancillary only( )  5. Pelvic pain in female Rx: - ibuprofen (ADVIL) 800 MG tablet; Take 1 tablet (800 mg total) by mouth every 8 (eight) hours as needed.  Dispense: 30 tablet; Refill: 6  6. Encounter for other general counseling and advice on contraception - wants Depo Provera  7. Encounter for initial prescription for injectable contraceptive - Depo Provera Rx  8. Routine adult health maintenance - Citranatal Harmony Rx  9. Nausea Rx: - promethazine (PHENERGAN) 25 MG tablet; Take 1 tablet (25 mg total) by mouth every 6 (six) hours as needed for nausea.  Dispense: 30 tablet; Refill: 2    Follow up in 5 months      Meds ordered this encounter  Medications  . PARoxetine (PAXIL) 20 MG tablet    Sig: Take 1 tablet (20 mg total) by mouth daily.    Dispense:  30 tablet    Refill:  11  . ibuprofen (ADVIL) 800 MG tablet    Sig: Take 1 tablet (800 mg total) by  mouth every 8 (eight) hours as needed.    Dispense:  30 tablet    Refill:  6  . oxyCODONE-acetaminophen (PERCOCET) 10-325 MG tablet    Sig: Take 1 tablet by mouth every 4 (four) hours as needed for pain.    Dispense:  30 tablet    Refill:  0  . Prenat-FeFmCb-DSS-FA-DHA w/o A (CITRANATAL HARMONY) 27-1-260 MG CAPS    Sig: Take 1 capsule by mouth daily.    Dispense:  30 capsule    Refill:  11  . promethazine (PHENERGAN) 25 MG tablet    Sig: Take 1 tablet (25 mg total) by mouth every 6 (six) hours as needed for nausea.    Dispense:  30 tablet    Refill:  2  . medroxyPROGESTERone (DEPO-PROVERA) injection 150 mg    Shelly Bombard MD 10-18-2018

## 2018-10-19 LAB — CERVICOVAGINAL ANCILLARY ONLY
Bacterial vaginitis: NEGATIVE
Candida vaginitis: NEGATIVE
Chlamydia: NEGATIVE
Neisseria Gonorrhea: NEGATIVE
Trichomonas: NEGATIVE

## 2018-10-20 ENCOUNTER — Other Ambulatory Visit: Payer: Self-pay

## 2018-10-20 DIAGNOSIS — Z0000 Encounter for general adult medical examination without abnormal findings: Secondary | ICD-10-CM

## 2018-10-20 MED ORDER — CITRANATAL BLOOM 90-1 MG PO TABS: 1.0000 | ORAL_TABLET | Freq: Every day | ORAL | 8 refills | Status: DC

## 2018-10-20 NOTE — Progress Notes (Signed)
citranatal bloom Rx

## 2018-10-25 ENCOUNTER — Other Ambulatory Visit: Payer: Self-pay

## 2018-10-25 MED ORDER — PRENATAL PLUS/IRON 27-1 MG PO TABS: 30.0000 mg | ORAL_TABLET | Freq: Every day | ORAL | 11 refills | Status: AC

## 2018-10-25 NOTE — Progress Notes (Signed)
RX changed to Prenatal Plus Iron.   Pharmacy cannot get Citranatal Bloom.

## 2018-11-30 ENCOUNTER — Other Ambulatory Visit: Payer: Self-pay

## 2018-11-30 DIAGNOSIS — Z20822 Contact with and (suspected) exposure to covid-19: Secondary | ICD-10-CM

## 2018-12-01 LAB — NOVEL CORONAVIRUS, NAA: SARS-CoV-2, NAA: NOT DETECTED

## 2019-01-09 ENCOUNTER — Ambulatory Visit: Payer: Medicaid Other

## 2019-01-16 ENCOUNTER — Ambulatory Visit (INDEPENDENT_AMBULATORY_CARE_PROVIDER_SITE_OTHER): Payer: Medicaid Other

## 2019-01-16 ENCOUNTER — Telehealth: Payer: Self-pay

## 2019-01-16 ENCOUNTER — Other Ambulatory Visit: Payer: Self-pay

## 2019-01-16 DIAGNOSIS — Z3042 Encounter for surveillance of injectable contraceptive: Secondary | ICD-10-CM

## 2019-01-16 MED ORDER — MEDROXYPROGESTERONE ACETATE 150 MG/ML IM SUSP
150.0000 mg | Freq: Once | INTRAMUSCULAR | Status: AC
  Administered 2019-01-16: 150 mg via INTRAMUSCULAR

## 2019-01-16 MED ORDER — MEDROXYPROGESTERONE ACETATE 150 MG/ML IM SUSP: 150.0000 mg | INTRAMUSCULAR | 6 refills | Status: DC

## 2019-01-16 NOTE — Telephone Encounter (Signed)
Pt would like to know if she can have a refill on her nausea and allergy medication.

## 2019-01-16 NOTE — Progress Notes (Signed)
Pt is here for depo injection. She is on time for injection, given in without difficulty. Next injection due Dec 21- Jan 4. Pt verbalizes understanding.

## 2019-01-17 ENCOUNTER — Other Ambulatory Visit: Payer: Self-pay | Admitting: Obstetrics

## 2019-01-17 DIAGNOSIS — R110 Nausea: Secondary | ICD-10-CM

## 2019-01-17 DIAGNOSIS — J301 Allergic rhinitis due to pollen: Secondary | ICD-10-CM

## 2019-01-17 MED ORDER — PROMETHAZINE HCL 25 MG PO TABS: 25.0000 mg | ORAL_TABLET | Freq: Four times a day (QID) | ORAL | 2 refills | Status: DC | PRN

## 2019-01-17 MED ORDER — LORATADINE 10 MG PO TABS: 10.0000 mg | ORAL_TABLET | Freq: Every day | ORAL | 11 refills | Status: AC

## 2019-01-17 NOTE — Telephone Encounter (Signed)
Phenergan and Claritin Rx

## 2019-01-18 ENCOUNTER — Telehealth: Payer: Self-pay | Admitting: *Deleted

## 2019-01-18 NOTE — Telephone Encounter (Signed)
Pt made aware of refills.   Pt states she is taking Ibuprofen 800 for cramping. Pt states is not helping. Pt was advised to alternate Ibuprofen and Tylenol.  Advised to try 2 Aleve per day for cramping. Any suggestion given, pt states does not work. Pt made aware that a stronger medication/narcotic Riquelme not be filled at this time.  Advised message to be sent to provider for review.    Please review.

## 2019-01-23 ENCOUNTER — Encounter (HOSPITAL_COMMUNITY): Payer: Self-pay | Admitting: Emergency Medicine

## 2019-01-23 ENCOUNTER — Other Ambulatory Visit: Payer: Self-pay

## 2019-01-23 ENCOUNTER — Ambulatory Visit (HOSPITAL_COMMUNITY)
Admission: EM | Admit: 2019-01-23 | Discharge: 2019-01-23 | Disposition: A | Discharge status: Home / Self Care | Payer: Medicaid Other | Attending: Emergency Medicine | Admitting: Emergency Medicine

## 2019-01-23 ENCOUNTER — Ambulatory Visit (INDEPENDENT_AMBULATORY_CARE_PROVIDER_SITE_OTHER): Payer: Medicaid Other

## 2019-01-23 DIAGNOSIS — M5442 Lumbago with sciatica, left side: Secondary | ICD-10-CM | POA: Diagnosis not present

## 2019-01-23 DIAGNOSIS — W108XXA Fall (on) (from) other stairs and steps, initial encounter: Secondary | ICD-10-CM | POA: Diagnosis not present

## 2019-01-23 MED ORDER — DEXAMETHASONE SODIUM PHOSPHATE 10 MG/ML IJ SOLN
INTRAMUSCULAR | Status: AC
  Filled 2019-01-23: qty 1

## 2019-01-23 MED ORDER — METHYLPREDNISOLONE 4 MG PO TABS: ORAL_TABLET | ORAL | 0 refills | Status: DC

## 2019-01-23 MED ORDER — DEXAMETHASONE SODIUM PHOSPHATE 10 MG/ML IJ SOLN
10.0000 mg | Freq: Once | INTRAMUSCULAR | Status: AC
  Administered 2019-01-23: 10 mg via INTRAMUSCULAR

## 2019-01-23 MED ORDER — HYDROCODONE-ACETAMINOPHEN 5-325 MG PO TABS: 1.0000 | ORAL_TABLET | Freq: Four times a day (QID) | ORAL | 0 refills | Status: DC | PRN

## 2019-01-23 NOTE — Discharge Instructions (Signed)
We gave you Decadron today Continue with Medrol Dosepak beginning in the morning-begin with 6 tablets on day 1, decrease by 1 tablet each day until you take 1 tablet on day 6 Apfel continue to supplement with Flexeril or Robaxin as needed I provided you with a few tablets of hydrocodone to use for more severe pain/nighttime pain.  Please use sparingly.  Do not drive or work after taking.  Follow-up with Raliegh Ip if symptoms persist

## 2019-01-23 NOTE — ED Provider Notes (Signed)
[facility]    CSN: [id number] Arrival date & time: 01/23/19  1600      History   Chief Complaint Chief Complaint  Patient presents with  . Back Pain    HPI Marissa Smith is a 33 y.o. female history of chronic back pain, presenting today for evaluation of back pain.  Patient states that yesterday patient slipped going down a set of wet wooden stairs.  Believes she fell approximately 5 stairs length.  Attempted to grab the railing and believes she twisted her back awkwardly.  Since she has had significant pain.  Patient reports history of previous bulging disks and ruptured disks.  Her back pain has been tolerable up until this current injury.  She previously was seen at [facility].  She has been taking ibuprofen 800, has tried Flexeril without relief.  Pain shoots down her left leg and notes that she has some numbness in her toes.  Denies saddle anesthesia.  Denies issues controlling urination or bowel movements.  Feels similar to her previous back pain.  Cannot sit in any particular position for too long.  Has had poor rest due to difficulty getting comfortable.  HPI  Past Medical History:  Diagnosis Date  . Back pain, chronic   . Cat allergies   . Depressed bipolar I disorder (HCC)   . Headache   . Seasonal allergies     Patient Active Problem List   Diagnosis Date Noted  . Encounter for IUD insertion 08/14/2015  . Dysmenorrhea 10/31/2012  . Abnormal uterine bleeding (AUB) 10/31/2012    Past Surgical History:  Procedure Laterality Date  . CHOLECYSTECTOMY  2010   lap chole    OB History    Gravida  4   Para  0   Term  0   Preterm  0   AB  1   Living  3     SAB  0   TAB  0   Ectopic  0   Multiple  0   Live Births  3            Home Medications    Prior to Admission medications   Medication Sig Start Date End Date Taking? Authorizing Provider  acetaminophen (TYLENOL) 325 MG tablet Take 650 mg by mouth every 6 (six) hours as  needed.    [provider]  albuterol (PROVENTIL HFA;VENTOLIN HFA) 108 (90 Base) MCG/ACT inhaler Inhale 1-2 puffs into the lungs every 6 (six) hours as needed for wheezing or shortness of breath. 04/10/18   Elson Areas, PA-C  azithromycin (ZITHROMAX) 250 MG tablet Take 1 tablet (250 mg total) by mouth daily. Take first 2 tablets together, then 1 every day until finished. Patient not taking: Reported on 06/06/2018 04/10/18   Elson Areas, PA-C  HYDROcodone-acetaminophen (NORCO/VICODIN) 5-325 MG tablet Take 1-2 tablets by mouth every 6 (six) hours as needed for severe pain. 01/23/19   Wieters, Hallie C, PA-C  ibuprofen (ADVIL) 800 MG tablet Take 1 tablet (800 mg total) by mouth every 8 (eight) hours as needed. 10/18/18   Brock Bad, MD  loratadine (CLARITIN) 10 MG tablet Take 1 tablet (10 mg total) by mouth daily. 01/17/19   Brock Bad, MD  medroxyPROGESTERone (DEPO-PROVERA) 150 MG/ML injection Inject 1 mL (150 mg total) into the muscle every 3 (three) months. 01/16/19   Brock Bad, MD  methylPREDNISolone (MEDROL) 4 MG tablet Begin with 6 tablets on day, decrease by 1 tablet each  day 01/23/19   Wieters, Hallie C, PA-C  Multiple Vitamin (MULTIVITAMIN WITH MINERALS) TABS tablet Take 2 tablets by mouth daily.    [provider]  oxyCODONE-acetaminophen (PERCOCET) 10-325 MG tablet Take 1 tablet by mouth every 4 (four) hours as needed for pain. 10/18/18   Shelly Bombard, MD  PARoxetine (PAXIL) 20 MG tablet Take 1 tablet (20 mg total) by mouth daily. 10/18/18   Shelly Bombard, MD  phenylephrine-shark liver oil-mineral oil-petrolatum (PREPARATION H) 0.25-14-74.9 % rectal ointment Place 1 application rectally 2 (two) times daily as needed for hemorrhoids.    [provider]  predniSONE (DELTASONE) 50 MG tablet Take 1 tablet (50 mg total) by mouth daily. Patient not taking: Reported on 06/06/2018 04/10/18   Fransico Meadow, PA-C  Prenat-FeFmCb-DSS-FA-DHA w/o A  (CITRANATAL HARMONY) 27-1-260 MG CAPS Take 1 capsule by mouth daily. 10/18/18   Shelly Bombard, MD  Prenatal-DSS-FeCb-FeGl-FA (CITRANATAL BLOOM) 90-1 MG TABS Take 1 tablet by mouth daily. 10/20/18   Shelly Bombard, MD  promethazine (PHENERGAN) 25 MG tablet Take 1 tablet (25 mg total) by mouth every 6 (six) hours as needed for nausea. 01/17/19   Shelly Bombard, MD  witch hazel-glycerin (TUCKS) pad Apply 1 application topically as needed for itching.    [provider]    Family History Family History  Problem Relation Age of Onset  . Heart failure Father     Social History Social History   Tobacco Use  . Smoking status: Light Tobacco Smoker    Packs/day: 0.25    Types: Cigarettes  . Smokeless tobacco: Never Used  Substance Use Topics  . Alcohol use: Yes    Alcohol/week: 0.0 standard drinks    Comment: Once a month  . Drug use: No     Allergies   Darvocet [propoxyphene n-acetaminophen] and Miconazole nitrate   Review of Systems Review of Systems  Constitutional: Negative for fatigue and fever.  HENT: Negative for mouth sores.   Eyes: Negative for visual disturbance.  Respiratory: Negative for shortness of breath.   Cardiovascular: Negative for chest pain.  Gastrointestinal: Negative for abdominal pain, nausea and vomiting.  Genitourinary: Negative for decreased urine volume, difficulty urinating and genital sores.  Musculoskeletal: Positive for back pain and myalgias. Negative for arthralgias and joint swelling.  Skin: Negative for color change, rash and wound.  Neurological: Negative for dizziness, weakness, light-headedness and headaches.     Physical Exam Triage Vital Signs ED Triage Vitals  Enc Vitals Group     BP 01/23/19 1649 121/83     Pulse Rate 01/23/19 1649 71     Resp 01/23/19 1649 16     Temp 01/23/19 1649 98.1 F (36.7 C)     Temp Source 01/23/19 1649 Temporal     SpO2 01/23/19 1649 99 %     Weight --      Height --      Head  Circumference --      Peak Flow --      Pain Score 01/23/19 1659 8     Pain Loc --      Pain Edu? --      Excl. in [place]? --    No data found.  Updated Vital Signs BP 121/83 (BP Location: Left Arm)   Pulse 71   Temp 98.1 F (36.7 C) (Temporal)   Resp 16   SpO2 99%   Visual Acuity Right Eye Distance:   Left Eye Distance:   Bilateral Distance:  Right Eye Near:   Left Eye Near:    Bilateral Near:     Physical Exam Vitals signs and nursing note reviewed.  Constitutional:      Appearance: She is well-developed.     Comments: Appears uncomfortable  HENT:     Head: Normocephalic and atraumatic.     Nose: Nose normal.  Eyes:     Conjunctiva/sclera: Conjunctivae normal.  Neck:     Musculoskeletal: Neck supple.  Cardiovascular:     Rate and Rhythm: Normal rate.  Pulmonary:     Effort: Pulmonary effort is normal. No respiratory distress.  Abdominal:     General: There is no distension.  Musculoskeletal: Normal range of motion.     Comments: Mild bruising noted to central lumbar spine, tenderness to palpation over entire lumbar spine midline, more prominent near bruising, tenderness diffusely throughout left lumbar musculature  Strength 4/5 on left side compared to right, patellar reflex 2+ bilaterally  Frequently changing positions during visit  Skin:    General: Skin is warm and dry.  Neurological:     Mental Status: She is alert and oriented to person, place, and time.      UC Treatments / Results  Labs (all labs ordered are listed, but only abnormal results are displayed) Labs Reviewed - No data to display  EKG   Radiology Dg Lumbar Spine Complete  Result Date: 01/23/2019 CLINICAL DATA:  33 year old female with fall and back pain. EXAM: LUMBAR SPINE - COMPLETE 4+ VIEW COMPARISON:  Lumbar spine radiograph dated 02/18/2016 FINDINGS: Five lumbar type vertebra. There is no acute fracture or subluxation of the lumbar spine. The vertebral body heights and disc  spaces are maintained. The visualized posterior elements appear intact. Right upper quadrant cholecystectomy clips. The soft tissues are unremarkable. IMPRESSION: No acute/traumatic lumbar spine pathology. Electronically Signed   By: Elgie Collard M.D.   On: 01/23/2019 18:52    Procedures Procedures (including critical care time)  Medications Ordered in UC Medications  dexamethasone (DECADRON) injection 10 mg (10 mg Intramuscular Given 01/23/19 1752)  dexamethasone (DECADRON) 10 MG/ML injection (has no administration in time range)    Initial Impression / Assessment and Plan / UC Course  I have reviewed the triage vital signs and the nursing notes.  Pertinent labs & imaging results that were available during my care of the patient were reviewed by me and considered in my medical decision making (see chart for details).     Patient with injury to back with history of previous back issues.  Will obtain x-ray today to ensure no fractures.  Discussed following up with Wendie Agreste if symptoms persisting for MRI is warranted.  Slightly decreased strength on left side, but feel this is more so related to avoiding pain versus true weakness.  No red flags for cauda equina.  Will provide Decadron in clinic today, will continue on Medrol Dosepak as patient has been on NSAIDs.  [place] continue muscle relaxers.  Did provide patient with hydrocodone for severe pain/nighttime pain.  Discussed use sparingly, no driving or working after taking.  Discussed activity modification.Discussed strict return precautions. Patient verbalized understanding and is agreeable with plan.  Final Clinical Impressions(s) / UC Diagnoses   Final diagnoses:  Acute bilateral low back pain with left-sided sciatica     Discharge Instructions     We gave you Decadron today Continue with Medrol Dosepak beginning in the morning-begin with 6 tablets on day 1, decrease by 1 tablet each day until you take  1 tablet on day 6  [place] continue to supplement with Flexeril or Robaxin as needed I provided you with a few tablets of hydrocodone to use for more severe pain/nighttime pain.  Please use sparingly.  Do not drive or work after taking.  Follow-up with Delbert HarnessMurphy Wainer if symptoms persist   ED Prescriptions    Medication Sig Dispense Auth. Provider   methylPREDNISolone (MEDROL) 4 MG tablet Begin with 6 tablets on day, decrease by 1 tablet each day 24 tablet Wieters, Hallie C, PA-C   HYDROcodone-acetaminophen (NORCO/VICODIN) 5-325 MG tablet Take 1-2 tablets by mouth every 6 (six) hours as needed for severe pain. 10 tablet Wieters, [place] C, PA-C     I have reviewed the PDMP during this encounter.   Lew DawesWieters, Hallie C, [place]PA-C 01/23/19 1916

## 2019-01-23 NOTE — ED Triage Notes (Signed)
Pt here for lower back pain after tripping down stairs yesterday

## 2019-04-04 ENCOUNTER — Encounter (HOSPITAL_COMMUNITY): Payer: Self-pay | Admitting: Emergency Medicine

## 2019-04-04 ENCOUNTER — Emergency Department (HOSPITAL_COMMUNITY)
Admission: EM | Admit: 2019-04-04 | Discharge: 2019-04-04 | Disposition: A | Discharge status: Home / Self Care | Payer: Medicaid Other | Attending: Emergency Medicine | Admitting: Emergency Medicine

## 2019-04-04 ENCOUNTER — Other Ambulatory Visit: Payer: Self-pay

## 2019-04-04 DIAGNOSIS — J0111 Acute recurrent frontal sinusitis: Secondary | ICD-10-CM | POA: Diagnosis not present

## 2019-04-04 DIAGNOSIS — Z79899 Other long term (current) drug therapy: Secondary | ICD-10-CM | POA: Insufficient documentation

## 2019-04-04 DIAGNOSIS — F17210 Nicotine dependence, cigarettes, uncomplicated: Secondary | ICD-10-CM | POA: Insufficient documentation

## 2019-04-04 DIAGNOSIS — F319 Bipolar disorder, unspecified: Secondary | ICD-10-CM | POA: Insufficient documentation

## 2019-04-04 DIAGNOSIS — R519 Headache, unspecified: Secondary | ICD-10-CM | POA: Diagnosis not present

## 2019-04-04 MED ORDER — AMOXICILLIN-POT CLAVULANATE 875-125 MG PO TABS: 1.0000 | ORAL_TABLET | Freq: Two times a day (BID) | ORAL | 0 refills | Status: AC

## 2019-04-04 MED ORDER — PROCHLORPERAZINE EDISYLATE 10 MG/2ML IJ SOLN
10.0000 mg | Freq: Once | INTRAMUSCULAR | Status: AC
  Administered 2019-04-04: 10 mg via INTRAVENOUS
  Filled 2019-04-04: qty 2

## 2019-04-04 MED ORDER — DIPHENHYDRAMINE HCL 50 MG/ML IJ SOLN
25.0000 mg | Freq: Once | INTRAMUSCULAR | Status: AC
  Administered 2019-04-04: 25 mg via INTRAVENOUS
  Filled 2019-04-04: qty 1

## 2019-04-04 MED ORDER — SODIUM CHLORIDE 0.9 % IV BOLUS
500.0000 mL | Freq: Once | INTRAVENOUS | Status: AC
  Administered 2019-04-04: 500 mL via INTRAVENOUS

## 2019-04-04 MED ORDER — KETOROLAC TROMETHAMINE 15 MG/ML IJ SOLN
15.0000 mg | Freq: Once | INTRAMUSCULAR | Status: AC
  Administered 2019-04-04: 15 mg via INTRAVENOUS
  Filled 2019-04-04: qty 1

## 2019-04-04 NOTE — ED Notes (Addendum)
Pt resting in bed with lights off.

## 2019-04-04 NOTE — Discharge Instructions (Addendum)
I have prescribed antibiotics to help treat your sinus infection, please know this antibiotics Basilio not be started if her symptoms do not improve within the next 5 days.  Please take 1 tablet twice a day for the next 7 days.  You Reece also use some Afrin to help with sinus relief.  You Najera continue take Tylenol or ibuprofen to help with your headache.  If you experience any changes in vision, worsening headache please return to the emergency department.

## 2019-04-04 NOTE — ED Triage Notes (Signed)
Sx began as runny nose and frontal HA this weekend, took medication yesterday but became worse today. Yesterday, felt suddenly tired and almost wrecked ("it's like I was fine and then my head just felt so sleepy and dipped forward"), after this experienced worsening HA, still frontal today.   Ha tried Midal, tylenol severe sinus (no relief). Does not take medications for chronic HA or migraines.

## 2019-04-04 NOTE — ED Provider Notes (Addendum)
[facility] Provider Note   CSN: [id number] Arrival date & time: 04/04/19  1259     History No chief complaint on file.   Marissa Smith is a 33 y.o. female.  33 y.o female with a PMH of HA, Depressed, recurrent sinus infections presents to the ED with a chief complaint of headache x saturday.  She describes a pressure to the front of her head, this is mostly between her forehead and paranasal, prior history of headaches with her sinus infections.  She reports yesterday suddenly she was at work when the headache began. She has taken Tylenol Sinus today without improvement, she also attempted to take some mild without any improvement.  She does reports this has been worsening.  She does endorse nasal congestion.  There is no exacerbating factors.  No photophobia, changes in vision, weakness, tingling, fevers or neck rigidity.  The history is provided by the patient.       Past Medical History:  Diagnosis Date  . Back pain, chronic   . Cat allergies   . Depressed bipolar I disorder (HCC)   . Headache   . Seasonal allergies     Patient Active Problem List   Diagnosis Date Noted  . Encounter for IUD insertion 08/14/2015  . Dysmenorrhea 10/31/2012  . Abnormal uterine bleeding (AUB) 10/31/2012    Past Surgical History:  Procedure Laterality Date  . CHOLECYSTECTOMY  2010   lap chole     OB History    Gravida  4   Para  0   Term  0   Preterm  0   AB  1   Living  3     SAB  0   TAB  0   Ectopic  0   Multiple  0   Live Births  3           Family History  Problem Relation Age of Onset  . Heart failure Father     Social History   Tobacco Use  . Smoking status: Heavy Tobacco Smoker    Packs/day: 0.50    Types: Cigarettes  . Smokeless tobacco: Never Used  Substance Use Topics  . Alcohol use: Yes    Alcohol/week: 0.0 standard drinks    Comment: Once a month  . Drug use: No    Home Medications Prior to  Admission medications   Medication Sig Start Date End Date Taking? Authorizing Provider  acetaminophen (TYLENOL) 325 MG tablet Take 650 mg by mouth every 6 (six) hours as needed.    [provider]  albuterol (PROVENTIL HFA;VENTOLIN HFA) 108 (90 Base) MCG/ACT inhaler Inhale 1-2 puffs into the lungs every 6 (six) hours as needed for wheezing or shortness of breath. 04/10/18   Elson AreasSofia, Leslie K, PA-C  amoxicillin-clavulanate (AUGMENTIN) 875-125 MG tablet Take 1 tablet by mouth 2 (two) times daily for 7 days. 04/04/19 04/11/19  Claude MangesSoto, Ailani Governale, PA-C  azithromycin (ZITHROMAX) 250 MG tablet Take 1 tablet (250 mg total) by mouth daily. Take first 2 tablets together, then 1 every day until finished. Patient not taking: Reported on 06/06/2018 04/10/18   Elson AreasSofia, Leslie K, PA-C  HYDROcodone-acetaminophen (NORCO/VICODIN) 5-325 MG tablet Take 1-2 tablets by mouth every 6 (six) hours as needed for severe pain. 01/23/19   Wieters, Hallie C, PA-C  ibuprofen (ADVIL) 800 MG tablet Take 1 tablet (800 mg total) by mouth every 8 (eight) hours as needed. 10/18/18   Brock BadHarper, Charles A, MD  loratadine (CLARITIN) 10  MG tablet Take 1 tablet (10 mg total) by mouth daily. 01/17/19   Brock Bad, MD  medroxyPROGESTERone (DEPO-PROVERA) 150 MG/ML injection Inject 1 mL (150 mg total) into the muscle every 3 (three) months. 01/16/19   Brock Bad, MD  methylPREDNISolone (MEDROL) 4 MG tablet Begin with 6 tablets on day, decrease by 1 tablet each day 01/23/19   Wieters, [place] C, PA-C  Multiple Vitamin (MULTIVITAMIN WITH MINERALS) TABS tablet Take 2 tablets by mouth daily.    [provider]  oxyCODONE-acetaminophen (PERCOCET) 10-325 MG tablet Take 1 tablet by mouth every 4 (four) hours as needed for pain. 10/18/18   Brock Bad, MD  PARoxetine (PAXIL) 20 MG tablet Take 1 tablet (20 mg total) by mouth daily. 10/18/18   Brock Bad, MD  phenylephrine-shark liver oil-mineral oil-petrolatum (PREPARATION H)  0.25-14-74.9 % rectal ointment Place 1 application rectally 2 (two) times daily as needed for hemorrhoids.    [provider]  predniSONE (DELTASONE) 50 MG tablet Take 1 tablet (50 mg total) by mouth daily. Patient not taking: Reported on 06/06/2018 04/10/18   Elson Areas, PA-C  Prenat-FeFmCb-DSS-FA-DHA w/o A (CITRANATAL HARMONY) 27-1-260 MG CAPS Take 1 capsule by mouth daily. 10/18/18   Brock Bad, MD  Prenatal-DSS-FeCb-FeGl-FA (CITRANATAL BLOOM) 90-1 MG TABS Take 1 tablet by mouth daily. 10/20/18   Brock Bad, MD  promethazine (PHENERGAN) 25 MG tablet Take 1 tablet (25 mg total) by mouth every 6 (six) hours as needed for nausea. 01/17/19   Brock Bad, MD  witch hazel-glycerin (TUCKS) pad Apply 1 application topically as needed for itching.    [provider]    Allergies    Darvocet [propoxyphene n-acetaminophen] and Miconazole nitrate  Review of Systems   Review of Systems  Constitutional: Negative for fever.  HENT: Positive for rhinorrhea, sinus pressure and sinus pain. Negative for voice change.   Eyes: Negative for photophobia.  Respiratory: Negative for shortness of breath.   Cardiovascular: Negative for chest pain.  Gastrointestinal: Negative for abdominal pain, nausea and vomiting.  Neurological: Positive for headaches. Negative for weakness and light-headedness.  All other systems reviewed and are negative.   Physical Exam Updated Vital Signs BP 110/75   Pulse (!) 58   Temp 98.3 F (36.8 C) (Oral)   Resp 16   SpO2 98%   Physical Exam Vitals and nursing note reviewed.  Constitutional:      Appearance: Normal appearance.  HENT:     Head: Normocephalic and atraumatic.     Nose: Congestion and rhinorrhea present.     Right Sinus: Maxillary sinus tenderness and frontal sinus tenderness present.     Left Sinus: Maxillary sinus tenderness and frontal sinus tenderness present.     Comments: TTP along the frontal > maxillary sinus     Mouth/Throat:     Mouth: Mucous membranes are moist.  Eyes:     Pupils: Pupils are equal, round, and reactive to light.  Neck:     Comments: No neck rigidity, no meningismus.  Cardiovascular:     Rate and Rhythm: Normal rate.  Pulmonary:     Effort: Pulmonary effort is normal.     Breath sounds: No wheezing.     Comments: Lungs are diminished to auscultation.  Abdominal:     General: Abdomen is flat.     Tenderness: There is no abdominal tenderness.  Musculoskeletal:     Cervical back: Normal range of motion and neck supple.  Skin:  General: Skin is warm and dry.  Neurological:     Mental Status: She is alert and oriented to person, place, and time.     Comments: Alert, oriented, thought content appropriate. Speech fluent without evidence of aphasia. Able to follow 2 step commands without difficulty.  Cranial Nerves:  II:  Peripheral visual fields grossly normal, pupils, round, reactive to light III,IV, VI: ptosis not present, extra-ocular motions intact bilaterally  V,VII: smile symmetric, facial light touch sensation equal VIII: hearing grossly normal bilaterally  IX,X: midline uvula rise  XI: bilateral shoulder shrug equal and strong XII: midline tongue extension  Motor:  5/5 in upper and lower extremities bilaterally including strong and equal grip strength and dorsiflexion/plantar flexion Sensory: light touch normal in all extremities.  Cerebellar: normal finger-to-nose with bilateral upper extremities, pronator drift negative Gait: normal gait and balance       ED Results / Procedures / Treatments   Labs (all labs ordered are listed, but only abnormal results are displayed) Labs Reviewed - No data to display  EKG None  Radiology No results found.  Procedures Procedures (including critical care time)  Medications Ordered in ED Medications  sodium chloride 0.9 % bolus 500 mL (0 mLs Intravenous Stopped 04/04/19 1601)  prochlorperazine (COMPAZINE)  injection 10 mg (10 mg Intravenous Given 04/04/19 1404)  diphenhydrAMINE (BENADRYL) injection 25 mg (25 mg Intravenous Given 04/04/19 1402)  ketorolac (TORADOL) 15 MG/ML injection 15 mg (15 mg Intravenous Given 04/04/19 1559)    ED Course  I have reviewed the triage vital signs and the nursing notes.  Pertinent labs & imaging results that were available during my care of the patient were reviewed by me and considered in my medical decision making (see chart for details).    MDM Rules/Calculators/A&P   Patient with a PMH of sinus infections, migraines, smoker presents to the ED with a chief complaint of headache x 3 days. Patient describes this as a constant pressure located to the front of her head, she has taken some over-the-counter Tylenol Sinus without improvement in her symptoms.  She has also taken Midol without improvement.  Reports that headache worsened yesterday while she was at work.  There is no photophobia with this, phonophobia, changes in vision, blurred vision.  She does not have any neck rigidity, no meningismus my exam, lower suspicion for meningitis.  After reviewing patient's chart personally I see a similar visit from February 18, 2018, patient represented with the same symptoms such as sinus pressure, headache, reported the headache with a 10 out of 10. She had a negative CT approximately 1 year ago.  On my exam she is neurologically intact without any focal deficit.  BP is within normal limits, no hypertension noted, lower suspicion for intracranial hemorrhage.  Even that headache has been going on since Saturday, no changes in vision or weakness lower suspicion for [facility].   She does report smoking, will symptomatically treat her headache prior to reassessment. Provided with benadryl, compazine, bolus and Toradol.  Patient was reevaluated by me, she reports headache has resolved after headache cocktail.  She does report her nose feels somewhat very congested, her symptoms  have began 3 days ago however she does have a history of recurrent sinus infection, I have offered her antibiotic therapy but she is not to begin this therapy for the next 5 days unless symptoms do not resolve on their own.  I offered patient a CT to further evaluate her headache however we discussed the risks and  benefits of this and a shared decision making was made to postpone x-ray at this time.  She is well-appearing, vitals are within normal limits.  Return precautions discussed at length.   Portions of this note were generated with Lobbyist. Dictation errors Golladay occur despite best attempts at proofreading.  Final Clinical Impression(s) / ED Diagnoses Final diagnoses:  Nonintractable headache, unspecified chronicity pattern, unspecified headache type  Acute recurrent frontal sinusitis    Rx / DC Orders ED Discharge Orders         Ordered    amoxicillin-clavulanate (AUGMENTIN) 875-125 MG tablet  2 times daily     04/04/19 1727           Janeece Fitting, PA-C 04/04/19 1728    Janeece Fitting, PA-C 04/04/19 Spickard, Sargent, DO 04/05/19 [phone]

## 2019-07-09 ENCOUNTER — Encounter (HOSPITAL_COMMUNITY): Payer: Self-pay | Admitting: *Deleted

## 2019-07-09 ENCOUNTER — Other Ambulatory Visit: Payer: Self-pay

## 2019-07-09 ENCOUNTER — Emergency Department (HOSPITAL_COMMUNITY)
Admission: EM | Admit: 2019-07-09 | Discharge: 2019-07-10 | Disposition: A | Discharge status: Home / Self Care | Payer: Medicaid Other | Attending: Emergency Medicine | Admitting: Emergency Medicine

## 2019-07-09 DIAGNOSIS — Y929 Unspecified place or not applicable: Secondary | ICD-10-CM | POA: Insufficient documentation

## 2019-07-09 DIAGNOSIS — Y939 Activity, unspecified: Secondary | ICD-10-CM | POA: Diagnosis not present

## 2019-07-09 DIAGNOSIS — Y999 Unspecified external cause status: Secondary | ICD-10-CM | POA: Insufficient documentation

## 2019-07-09 DIAGNOSIS — F17290 Nicotine dependence, other tobacco product, uncomplicated: Secondary | ICD-10-CM | POA: Diagnosis not present

## 2019-07-09 DIAGNOSIS — S3992XA Unspecified injury of lower back, initial encounter: Secondary | ICD-10-CM | POA: Insufficient documentation

## 2019-07-09 DIAGNOSIS — W1830XA Fall on same level, unspecified, initial encounter: Secondary | ICD-10-CM | POA: Insufficient documentation

## 2019-07-09 DIAGNOSIS — M533 Sacrococcygeal disorders, not elsewhere classified: Secondary | ICD-10-CM | POA: Diagnosis present

## 2019-07-09 MED ORDER — TRAMADOL HCL 50 MG PO TABS: 50.0000 mg | ORAL_TABLET | Freq: Four times a day (QID) | ORAL | 0 refills | Status: DC | PRN

## 2019-07-09 MED ORDER — IBUPROFEN 800 MG PO TABS: 800.0000 mg | ORAL_TABLET | Freq: Three times a day (TID) | ORAL | 0 refills | Status: DC | PRN

## 2019-07-09 MED ORDER — IBUPROFEN 800 MG PO TABS
800.0000 mg | ORAL_TABLET | Freq: Once | ORAL | Status: AC
  Administered 2019-07-09: 800 mg via ORAL
  Filled 2019-07-09: qty 1

## 2019-07-09 MED ORDER — OXYCODONE-ACETAMINOPHEN 5-325 MG PO TABS
1.0000 | ORAL_TABLET | Freq: Once | ORAL | Status: AC
  Administered 2019-07-09: 1 via ORAL
  Filled 2019-07-09: qty 1

## 2019-07-09 NOTE — ED Triage Notes (Signed)
THE PT FELL ONTO HER COCCYX 2 HOURS AGO WHENEVER SHE FELL BACKWARD OVER SOME POTS.  C.O SEVERE PAIN IN HER COCCYX  FRACTURED THERE IN THE PAST

## 2019-07-09 NOTE — ED Notes (Signed)
Pt outside waiting with 36 year old daughter because her daughter cant come back with her. She states when her daughters ride comes in she will come in to be seen.

## 2019-07-09 NOTE — Discharge Instructions (Addendum)
X-ray today is negative.  Return here as needed.  Follow-up with your primary doctor.  Poteat need to use a cushion such as an inflatable donut to take pressure off while sitting.

## 2019-07-09 NOTE — ED Provider Notes (Signed)
[facility] EMERGENCY DEPARTMENT Provider Note   CSN: [id number] Arrival date & time: 07/09/19  2127     History Chief Complaint  Patient presents with  . Fall    Marissa Smith is a 34 y.o. female.  HPI Patient presents to the emergency department with pain in her coccyx on the fall where she landed directly on her tailbone.  The patient states she is injured this area before.  Patient states that she did not take any medications prior to arrival for her symptoms.  Patient has no other injuries and did not hit her head.  Patient states she did not lose consciousness.  Patient denies any neck pain, abdominal pain, shortness of breath, nausea, vomiting, weakness, dizziness, numbness or syncope.    Past Medical History:  Diagnosis Date  . Back pain, chronic   . Cat allergies   . Depressed bipolar I disorder (HCC)   . Headache   . Seasonal allergies     Patient Active Problem List   Diagnosis Date Noted  . Encounter for IUD insertion 08/14/2015  . Dysmenorrhea 10/31/2012  . Abnormal uterine bleeding (AUB) 10/31/2012    Past Surgical History:  Procedure Laterality Date  . CHOLECYSTECTOMY  2010   lap chole     OB History    Gravida  4   Para  0   Term  0   Preterm  0   AB  1   Living  3     SAB  0   TAB  0   Ectopic  0   Multiple  0   Live Births  3           Family History  Problem Relation Age of Onset  . Heart failure Father     Social History   Tobacco Use  . Smoking status: Heavy Tobacco Smoker    Packs/day: 0.50    Types: Cigarettes  . Smokeless tobacco: Never Used  Substance Use Topics  . Alcohol use: Yes    Alcohol/week: 0.0 standard drinks    Comment: Once a month  . Drug use: No    Home Medications Prior to Admission medications   Medication Sig Start Date End Date Taking? Authorizing Provider  acetaminophen (TYLENOL) 325 MG tablet Take 650 mg by mouth every 6 (six) hours as needed.    [provider]  albuterol (PROVENTIL HFA;VENTOLIN HFA) 108 (90 Base) MCG/ACT inhaler Inhale 1-2 puffs into the lungs every 6 (six) hours as needed for wheezing or shortness of breath. 04/10/18   Elson Areas, PA-C  azithromycin (ZITHROMAX) 250 MG tablet Take 1 tablet (250 mg total) by mouth daily. Take first 2 tablets together, then 1 every day until finished. Patient not taking: Reported on 06/06/2018 04/10/18   Elson Areas, PA-C  HYDROcodone-acetaminophen (NORCO/VICODIN) 5-325 MG tablet Take 1-2 tablets by mouth every 6 (six) hours as needed for severe pain. 01/23/19   Wieters, Hallie C, PA-C  ibuprofen (ADVIL) 800 MG tablet Take 1 tablet (800 mg total) by mouth every 8 (eight) hours as needed. 10/18/18   Brock Bad, MD  loratadine (CLARITIN) 10 MG tablet Take 1 tablet (10 mg total) by mouth daily. 01/17/19   Brock Bad, MD  medroxyPROGESTERone (DEPO-PROVERA) 150 MG/ML injection Inject 1 mL (150 mg total) into the muscle every 3 (three) months. 01/16/19   Brock Bad, MD  methylPREDNISolone (MEDROL) 4 MG tablet Begin with 6 tablets on day, decrease by  1 tablet each day 01/23/19   Wieters, [place] C, PA-C  Multiple Vitamin (MULTIVITAMIN WITH MINERALS) TABS tablet Take 2 tablets by mouth daily.    [provider]  oxyCODONE-acetaminophen (PERCOCET) 10-325 MG tablet Take 1 tablet by mouth every 4 (four) hours as needed for pain. 10/18/18   Shelly Bombard, MD  PARoxetine (PAXIL) 20 MG tablet Take 1 tablet (20 mg total) by mouth daily. 10/18/18   Shelly Bombard, MD  phenylephrine-shark liver oil-mineral oil-petrolatum (PREPARATION H) 0.25-14-74.9 % rectal ointment Place 1 application rectally 2 (two) times daily as needed for hemorrhoids.    [provider]  predniSONE (DELTASONE) 50 MG tablet Take 1 tablet (50 mg total) by mouth daily. Patient not taking: Reported on 06/06/2018 04/10/18   Fransico Meadow, PA-C  Prenat-FeFmCb-DSS-FA-DHA w/o A (CITRANATAL HARMONY)  27-1-260 MG CAPS Take 1 capsule by mouth daily. 10/18/18   Shelly Bombard, MD  Prenatal-DSS-FeCb-FeGl-FA (CITRANATAL BLOOM) 90-1 MG TABS Take 1 tablet by mouth daily. 10/20/18   Shelly Bombard, MD  promethazine (PHENERGAN) 25 MG tablet Take 1 tablet (25 mg total) by mouth every 6 (six) hours as needed for nausea. 01/17/19   Shelly Bombard, MD  witch hazel-glycerin (TUCKS) pad Apply 1 application topically as needed for itching.    [provider]    Allergies    Darvocet [propoxyphene n-acetaminophen] and Miconazole nitrate  Review of Systems   Review of Systems All other systems negative except as documented in the HPI. All pertinent positives and negatives as reviewed in the HPI. Physical Exam Updated Vital Signs BP (!) 129/57 (BP Location: Right Arm)   Pulse 92   Temp 98.9 F (37.2 C) (Oral)   Resp 18   Ht 5\' 5"  (1.651 m)   Wt 80.7 kg   SpO2 99%   BMI 29.62 kg/m   Physical Exam Vitals and nursing note reviewed.  Constitutional:      General: She is not in acute distress.    Appearance: She is well-developed.  HENT:     Head: Normocephalic and atraumatic.  Eyes:     Pupils: Pupils are equal, round, and reactive to light.  Pulmonary:     Effort: Pulmonary effort is normal.  Musculoskeletal:       Legs:  Skin:    General: Skin is warm and dry.  Neurological:     Mental Status: She is alert and oriented to person, place, and time.     ED Results / Procedures / Treatments   Labs (all labs ordered are listed, but only abnormal results are displayed) Labs Reviewed  I-STAT BETA HCG BLOOD, ED ([facility], [facility], [facility] ONLY)    EKG None  Radiology No results found.  Procedures Procedures (including critical care time)  Medications Ordered in ED Medications - No data to display  ED Course  I have reviewed the triage vital signs and the nursing notes.  Pertinent labs & imaging results that were available during my care of the patient were reviewed by me and  considered in my medical decision making (see chart for details).    MDM Rules/Calculators/A&P                      Patient will be x-rayed in this region to look for any fractures.  We will also give her pain control as well.  Patient will be treated for coccyx injury whether it is broken or not.  Patient be advised she will  need to use a donut to sit on. Final Clinical Impression(s) / ED Diagnoses Final diagnoses:  None    Rx / DC Orders ED Discharge Orders    None       Charlestine Night, PA-C 07/09/19 2334    Geoffery Lyons, MD 07/10/19 [phone]

## 2019-07-10 ENCOUNTER — Emergency Department (HOSPITAL_COMMUNITY): Payer: Medicaid Other

## 2019-07-10 LAB — I-STAT BETA HCG BLOOD, ED (MC, WL, AP ONLY): I-stat hCG, quantitative: <5 m[IU]/mL (ref <5)

## 2019-07-15 ENCOUNTER — Encounter (HOSPITAL_COMMUNITY): Payer: Self-pay

## 2019-07-15 ENCOUNTER — Ambulatory Visit (HOSPITAL_COMMUNITY): Payer: Medicaid Other

## 2019-07-15 ENCOUNTER — Ambulatory Visit (HOSPITAL_COMMUNITY)
Admission: EM | Admit: 2019-07-15 | Discharge: 2019-07-15 | Disposition: A | Discharge status: Home / Self Care | Payer: Medicaid Other | Attending: Family Medicine | Admitting: Family Medicine

## 2019-07-15 ENCOUNTER — Other Ambulatory Visit: Payer: Self-pay

## 2019-07-15 DIAGNOSIS — S300XXA Contusion of lower back and pelvis, initial encounter: Secondary | ICD-10-CM

## 2019-07-15 MED ORDER — HYDROCODONE-ACETAMINOPHEN 5-325 MG PO TABS: 1.0000 | ORAL_TABLET | Freq: Four times a day (QID) | ORAL | 0 refills | Status: DC | PRN

## 2019-07-15 MED ORDER — MELOXICAM 15 MG PO TABS: 15.0000 mg | ORAL_TABLET | Freq: Every day | ORAL | 0 refills | Status: DC

## 2019-07-15 NOTE — ED Triage Notes (Signed)
Pt is here from falling last Sunday & states she already when to the ED for this incident. Pt ALL of the pain meds that were given to her arent working at all.

## 2019-07-15 NOTE — Discharge Instructions (Signed)
Ice application.  Meloxicam daily, take with food. Don't take additional ibuprofen.  Hydrocodone for breakthrough pain. [place] cause drowsiness. Please do not take if driving or drinking alcohol. [place] cause constipation.  Use of donut pillow.  Light and regular activity as tolerated.  Continue to follow with your primary care provider as needed for persistent pain

## 2019-07-16 NOTE — ED Provider Notes (Signed)
[place]    CSN: [id number] Arrival date & time: 07/15/19  1738      History   Chief Complaint Chief Complaint  Patient presents with  . Tailbone Pain    HPI Marissa Smith is a 34 y.o. female.   Marissa Smith presents with complaints of pain to coccyx after a fall 3/28. She tripped in the dark on her porch, falling back and landing on her buttocks. She went to the ER that day and had coccyx imaging completed which was negative. She was provided with tramadol for pain management and recommendations for donut pillow. She feels like tramadol has not been helping, causes a headache for her. She has been taking ibuprofen 800mg  which does seem to help some. History of back pain and back issues in the past. She does have bruising to her tailbone from the fall. Pain is worse with certain positions and activity. No saddle symptoms. No new numbness or tingling. No loss of bladder or bowel control.     ROS per HPI, negative if not otherwise mentioned.      Past Medical History:  Diagnosis Date  . Back pain, chronic   . Cat allergies   . Depressed bipolar I disorder ([place])   . Headache   . Seasonal allergies     Patient Active Problem List   Diagnosis Date Noted  . Encounter for IUD insertion 08/14/2015  . Dysmenorrhea 10/31/2012  . Abnormal uterine bleeding (AUB) 10/31/2012    Past Surgical History:  Procedure Laterality Date  . CHOLECYSTECTOMY  2010   lap chole    OB History    Gravida  4   Para  0   Term  0   Preterm  0   AB  1   Living  3     SAB  0   TAB  0   Ectopic  0   Multiple  0   Live Births  3            Home Medications    Prior to Admission medications   Medication Sig Start Date End Date Taking? Authorizing Provider  acetaminophen (TYLENOL) 325 MG tablet Take 650 mg by mouth every 6 (six) hours as needed.    [provider]  albuterol (PROVENTIL HFA;VENTOLIN HFA) 108 (90 Base) MCG/ACT inhaler Inhale 1-2  puffs into the lungs every 6 (six) hours as needed for wheezing or shortness of breath. 04/10/18   Fransico Meadow, PA-C  azithromycin (ZITHROMAX) 250 MG tablet Take 1 tablet (250 mg total) by mouth daily. Take first 2 tablets together, then 1 every day until finished. Patient not taking: Reported on 06/06/2018 04/10/18   Fransico Meadow, PA-C  HYDROcodone-acetaminophen (NORCO/VICODIN) 5-325 MG tablet Take 1 tablet by mouth every 6 (six) hours as needed for severe pain. 07/15/19   Zigmund Gottron, NP  ibuprofen (ADVIL) 800 MG tablet Take 1 tablet (800 mg total) by mouth every 8 (eight) hours as needed. 07/09/19   Lawyer, Harrell Gave, PA-C  loratadine (CLARITIN) 10 MG tablet Take 1 tablet (10 mg total) by mouth daily. 01/17/19   Shelly Bombard, MD  medroxyPROGESTERone (DEPO-PROVERA) 150 MG/ML injection Inject 1 mL (150 mg total) into the muscle every 3 (three) months. 01/16/19   Shelly Bombard, MD  meloxicam (MOBIC) 15 MG tablet Take 1 tablet (15 mg total) by mouth daily. 07/15/19   Zigmund Gottron, NP  methylPREDNISolone (MEDROL) 4 MG tablet Begin with 6 tablets  on day, decrease by 1 tablet each day 01/23/19   Wieters, Jar[place] C, PA-C  Multiple Vitamin (MULTIVITAMIN WITH MINERALS) TABS tablet Take 2 tablets by mouth daily.    [provider]  oxyCODONE-acetaminophen (PERCOCET) 10-325 MG tablet Take 1 tablet by mouth every 4 (four) hours as needed for pain. 10/18/18   Brock Bad, MD  PARoxetine (PAXIL) 20 MG tablet Take 1 tablet (20 mg total) by mouth daily. 10/18/18   Brock Bad, MD  phenylephrine-shark liver oil-mineral oil-petrolatum (PREPARATION H) 0.25-14-74.9 % rectal ointment Place 1 application rectally 2 (two) times daily as needed for hemorrhoids.    [provider]  predniSONE (DELTASONE) 50 MG tablet Take 1 tablet (50 mg total) by mouth daily. Patient not taking: Reported on 06/06/2018 04/10/18   Elson Areas, PA-C  Prenat-FeFmCb-DSS-FA-DHA w/o A (CITRANATAL  HARMONY) 27-1-260 MG CAPS Take 1 capsule by mouth daily. 10/18/18   Brock Bad, MD  Prenatal-DSS-FeCb-FeGl-FA (CITRANATAL BLOOM) 90-1 MG TABS Take 1 tablet by mouth daily. 10/20/18   Brock Bad, MD  promethazine (PHENERGAN) 25 MG tablet Take 1 tablet (25 mg total) by mouth every 6 (six) hours as needed for nausea. 01/17/19   Brock Bad, MD  traMADol (ULTRAM) 50 MG tablet Take 1 tablet (50 mg total) by mouth every 6 (six) hours as needed for severe pain. 07/09/19   Lawyer, Cristal Deer, PA-C  witch hazel-glycerin (TUCKS) pad Apply 1 application topically as needed for itching.    [provider]    Family History Family History  Problem Relation Age of Onset  . Healthy Mother   . Heart failure Father     Social History Social History   Tobacco Use  . Smoking status: Heavy Tobacco Smoker    Packs/day: 0.50    Types: Cigarettes  . Smokeless tobacco: Never Used  Substance Use Topics  . Alcohol use: Yes    Alcohol/week: 0.0 standard drinks    Comment: Once a month  . Drug use: No     Allergies   Darvocet [propoxyphene n-acetaminophen] and Miconazole nitrate   Review of Systems Review of Systems   Physical Exam Triage Vital Signs ED Triage Vitals  Enc Vitals Group     BP 07/15/19 1753 135/83     Pulse Rate 07/15/19 1753 85     Resp 07/15/19 1753 18     Temp 07/15/19 1753 98.6 F (37 C)     Temp Source 07/15/19 1753 Oral     SpO2 07/15/19 1753 99 %     Weight 07/15/19 1752 180 lb (81.6 kg)     Height --      Head Circumference --      Peak Flow --      Pain Score 07/15/19 1752 8     Pain Loc --      Pain Edu? --      Excl. in GC? --    No data found.  Updated Vital Signs BP 135/83 (BP Location: Right Arm)   Pulse 85   Temp 98.6 F (37 C) (Oral)   Resp 18   Wt 180 lb (81.6 kg)   LMP 07/09/2019   SpO2 99%   BMI 29.95 kg/m   Visual Acuity Right Eye Distance:   Left Eye Distance:   Bilateral Distance:    Right Eye Near:   Left  Eye Near:    Bilateral Near:     Physical Exam Constitutional:      General:  She is not in acute distress.    Appearance: She is well-developed.     Comments: Standing due to discomfort   Cardiovascular:     Rate and Rhythm: Normal rate.  Pulmonary:     Effort: Pulmonary effort is normal.  Musculoskeletal:     Comments: Bruising visible to coccyx with significant point tenderness; ambulatory without difficulty; strength equal bilaterally; gross sensation intact to lower extremeties  Skin:    General: Skin is warm and dry.  Neurological:     Mental Status: She is alert and oriented to person, place, and time.      UC Treatments / Results  Labs (all labs ordered are listed, but only abnormal results are displayed) Labs Reviewed - No data to display  EKG   Radiology No results found.  Procedures Procedures (including critical care time)  Medications Ordered in UC Medications - No data to display  Initial Impression / Assessment and Plan / UC Course  I have reviewed the triage vital signs and the nursing notes.  Pertinent labs & imaging results that were available during my care of the patient were reviewed by me and considered in my medical decision making (see chart for details).     coccyx contusion s/p fall 3/28. Imaging completed in ER and reviewed today, negative. No red flag findings. Pain management discussed. 10 tabs of norco provided for breakthrough pain. Return precautions provided. Patient verbalized understanding and agreeable to plan.  Ambulatory out of clinic without difficulty.    Final Clinical Impressions(s) / UC Diagnoses   Final diagnoses:  Contusion of coccyx, initial encounter     Discharge Instructions     Ice application.  Meloxicam daily, take with food. Don't take additional ibuprofen.  Hydrocodone for breakthrough pain. [place] cause drowsiness. Please do not take if driving or drinking alcohol. [place] cause constipation.  Use of donut  pillow.  Light and regular activity as tolerated.  Continue to follow with your primary care provider as needed for persistent pain   ED Prescriptions    Medication Sig Dispense Auth. Provider   meloxicam (MOBIC) 15 MG tablet Take 1 tablet (15 mg total) by mouth daily. 30 tablet Linus Mako B, NP   HYDROcodone-acetaminophen (NORCO/VICODIN) 5-325 MG tablet Take 1 tablet by mouth every 6 (six) hours as needed for severe pain. 10 tablet Georgetta Haber, NP     I have reviewed the PDMP during this encounter.   Georgetta Haber, NP 07/16/19 1010

## 2019-11-01 ENCOUNTER — Ambulatory Visit (HOSPITAL_COMMUNITY)
Admission: EM | Admit: 2019-11-01 | Discharge: 2019-11-01 | Disposition: A | Discharge status: Home / Self Care | Payer: Medicaid Other | Attending: Family Medicine | Admitting: Family Medicine

## 2019-11-01 ENCOUNTER — Other Ambulatory Visit: Payer: Self-pay

## 2019-11-01 ENCOUNTER — Encounter (HOSPITAL_COMMUNITY): Payer: Self-pay

## 2019-11-01 DIAGNOSIS — S30861A Insect bite (nonvenomous) of abdominal wall, initial encounter: Secondary | ICD-10-CM | POA: Diagnosis not present

## 2019-11-01 DIAGNOSIS — W57XXXA Bitten or stung by nonvenomous insect and other nonvenomous arthropods, initial encounter: Secondary | ICD-10-CM | POA: Diagnosis not present

## 2019-11-01 MED ORDER — TRIAMCINOLONE ACETONIDE 0.1 % EX CREA: 1.0000 "application " | TOPICAL_CREAM | Freq: Two times a day (BID) | CUTANEOUS | 0 refills | Status: DC

## 2019-11-01 MED ORDER — HYDROXYZINE HCL 25 MG PO TABS: 25.0000 mg | ORAL_TABLET | Freq: Four times a day (QID) | ORAL | 0 refills | Status: DC

## 2019-11-01 NOTE — ED Provider Notes (Signed)
[facility]    CSN: [id number] Arrival date & time: 11/01/19  1805      History   Chief Complaint Chief Complaint  Patient presents with  . Rash    HPI Marissa Smith is a 34 y.o. female.   Patient does maintenance work under houses.  About a week ago she was under house and felt several stings.  She now has 4-5 bright red areas on her lower abdomen that itch and burn.  She did not see any offending insect or spider.  She has been using Neosporin ointment on these lesions.  HPI  Past Medical History:  Diagnosis Date  . Back pain, chronic   . Cat allergies   . Depressed bipolar I disorder (HCC)   . Headache   . Seasonal allergies     Patient Active Problem List   Diagnosis Date Noted  . Encounter for IUD insertion 08/14/2015  . Dysmenorrhea 10/31/2012  . Abnormal uterine bleeding (AUB) 10/31/2012    Past Surgical History:  Procedure Laterality Date  . CHOLECYSTECTOMY  2010   lap chole    OB History    Gravida  4   Para  0   Term  0   Preterm  0   AB  1   Living  3     SAB  0   TAB  0   Ectopic  0   Multiple  0   Live Births  3            Home Medications    Prior to Admission medications   Medication Sig Start Date End Date Taking? Authorizing Provider  acetaminophen (TYLENOL) 325 MG tablet Take 650 mg by mouth every 6 (six) hours as needed.    [provider]  albuterol (PROVENTIL HFA;VENTOLIN HFA) 108 (90 Base) MCG/ACT inhaler Inhale 1-2 puffs into the lungs every 6 (six) hours as needed for wheezing or shortness of breath. 04/10/18   Elson Areas, PA-C  azithromycin (ZITHROMAX) 250 MG tablet Take 1 tablet (250 mg total) by mouth daily. Take first 2 tablets together, then 1 every day until finished. Patient not taking: Reported on 06/06/2018 04/10/18   Elson Areas, PA-C  HYDROcodone-acetaminophen (NORCO/VICODIN) 5-325 MG tablet Take 1 tablet by mouth every 6 (six) hours as needed for severe pain. 07/15/19    Georgetta Haber, NP  ibuprofen (ADVIL) 800 MG tablet Take 1 tablet (800 mg total) by mouth every 8 (eight) hours as needed. 07/09/19   Lawyer, Cristal Deer, PA-C  loratadine (CLARITIN) 10 MG tablet Take 1 tablet (10 mg total) by mouth daily. 01/17/19   Brock Bad, MD  medroxyPROGESTERone (DEPO-PROVERA) 150 MG/ML injection Inject 1 mL (150 mg total) into the muscle every 3 (three) months. 01/16/19   Brock Bad, MD  meloxicam (MOBIC) 15 MG tablet Take 1 tablet (15 mg total) by mouth daily. 07/15/19   Georgetta Haber, NP  methylPREDNISolone (MEDROL) 4 MG tablet Begin with 6 tablets on day, decrease by 1 tablet each day 01/23/19   Wieters, Hallie C, PA-C  Multiple Vitamin (MULTIVITAMIN WITH MINERALS) TABS tablet Take 2 tablets by mouth daily.    [provider]  oxyCODONE-acetaminophen (PERCOCET) 10-325 MG tablet Take 1 tablet by mouth every 4 (four) hours as needed for pain. 10/18/18   Brock Bad, MD  PARoxetine (PAXIL) 20 MG tablet Take 1 tablet (20 mg total) by mouth daily. 10/18/18   Brock Bad, MD  phenylephrine-shark  liver oil-mineral oil-petrolatum (PREPARATION H) 0.25-14-74.9 % rectal ointment Place 1 application rectally 2 (two) times daily as needed for hemorrhoids.    [provider]  predniSONE (DELTASONE) 50 MG tablet Take 1 tablet (50 mg total) by mouth daily. Patient not taking: Reported on 06/06/2018 04/10/18   Elson Areas, PA-C  Prenat-FeFmCb-DSS-FA-DHA w/o A (CITRANATAL HARMONY) 27-1-260 MG CAPS Take 1 capsule by mouth daily. 10/18/18   Brock Bad, MD  Prenatal-DSS-FeCb-FeGl-FA (CITRANATAL BLOOM) 90-1 MG TABS Take 1 tablet by mouth daily. 10/20/18   Brock Bad, MD  promethazine (PHENERGAN) 25 MG tablet Take 1 tablet (25 mg total) by mouth every 6 (six) hours as needed for nausea. 01/17/19   Brock Bad, MD  traMADol (ULTRAM) 50 MG tablet Take 1 tablet (50 mg total) by mouth every 6 (six) hours as needed for severe pain. 07/09/19    Lawyer, Cristal Deer, PA-C  witch hazel-glycerin (TUCKS) pad Apply 1 application topically as needed for itching.    [provider]    Family History Family History  Problem Relation Age of Onset  . Healthy Mother   . Heart failure Father     Social History Social History   Tobacco Use  . Smoking status: Heavy Tobacco Smoker    Packs/day: 0.50    Types: Cigarettes  . Smokeless tobacco: Never Used  Vaping Use  . Vaping Use: Never used  Substance Use Topics  . Alcohol use: Yes    Alcohol/week: 0.0 standard drinks    Comment: Once a month  . Drug use: No     Allergies   Darvocet [propoxyphene n-acetaminophen] and Miconazole nitrate   Review of Systems Review of Systems  Skin: Positive for rash.  All other systems reviewed and are negative.    Physical Exam Triage Vital Signs ED Triage Vitals  Enc Vitals Group     BP 11/01/19 1829 (!) 146/70     Pulse Rate 11/01/19 1829 68     Resp 11/01/19 1829 18     Temp 11/01/19 1829 98.1 F (36.7 C)     Temp Source 11/01/19 1829 Oral     SpO2 11/01/19 1829 98 %     Weight 11/01/19 1832 178 lb (80.7 kg)     Height 11/01/19 1832 5\' 5"  (1.651 m)     Head Circumference --      Peak Flow --      Pain Score 11/01/19 1832 0     Pain Loc --      Pain Edu? --      Excl. in GC? --    No data found.  Updated Vital Signs BP (!) 146/70   Pulse 68   Temp 98.1 F (36.7 C) (Oral)   Resp 18   Ht 5\' 5"  (1.651 m)   Wt 80.7 kg   SpO2 98%   BMI 29.62 kg/m   Visual Acuity Right Eye Distance:   Left Eye Distance:   Bilateral Distance:    Right Eye Near:   Left Eye Near:    Bilateral Near:     Physical Exam Vitals and nursing note reviewed.  Constitutional:      Appearance: Normal appearance.  Skin:    Comments: 5 bright red circular lesions on lower abdomen.  There is no blistering but patient feels like areas are expanding.  Neurological:     Mental Status: She is alert.      UC Treatments / Results   Labs (all labs ordered  are listed, but only abnormal results are displayed) Labs Reviewed - No data to display  EKG   Radiology No results found.  Procedures Procedures (including critical care time)  Medications Ordered in UC Medications - No data to display  Initial Impression / Assessment and Plan / UC Course  I have reviewed the triage vital signs and the nursing notes.  Pertinent labs & imaging results that were available during my care of the patient were reviewed by me and considered in my medical decision making (see chart for details).     Insect bites, lower abdomen Final Clinical Impressions(s) / UC Diagnoses   Final diagnoses:  None   Discharge Instructions   None    ED Prescriptions    None     PDMP not reviewed this encounter.   Frederica Kuster, MD 11/01/19 1919

## 2019-11-01 NOTE — ED Triage Notes (Signed)
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 Pt has 9 red circles on her lower abdomen that burns when sweat or water hits it.

## 2020-02-12 ENCOUNTER — Other Ambulatory Visit: Payer: Self-pay

## 2020-02-12 ENCOUNTER — Emergency Department (HOSPITAL_COMMUNITY): Payer: Medicaid Other

## 2020-02-12 ENCOUNTER — Emergency Department (HOSPITAL_COMMUNITY)
Admission: EM | Admit: 2020-02-12 | Discharge: 2020-02-12 | Disposition: A | Discharge status: Home / Self Care | Payer: Medicaid Other | Attending: Emergency Medicine | Admitting: Emergency Medicine

## 2020-02-12 ENCOUNTER — Encounter (HOSPITAL_COMMUNITY): Payer: Self-pay

## 2020-02-12 DIAGNOSIS — R0789 Other chest pain: Secondary | ICD-10-CM | POA: Diagnosis not present

## 2020-02-12 DIAGNOSIS — R519 Headache, unspecified: Secondary | ICD-10-CM | POA: Diagnosis not present

## 2020-02-12 DIAGNOSIS — R0602 Shortness of breath: Secondary | ICD-10-CM | POA: Diagnosis not present

## 2020-02-12 DIAGNOSIS — R079 Chest pain, unspecified: Secondary | ICD-10-CM | POA: Diagnosis present

## 2020-02-12 DIAGNOSIS — Z8249 Family history of ischemic heart disease and other diseases of the circulatory system: Secondary | ICD-10-CM | POA: Insufficient documentation

## 2020-02-12 DIAGNOSIS — F17210 Nicotine dependence, cigarettes, uncomplicated: Secondary | ICD-10-CM | POA: Diagnosis not present

## 2020-02-12 DIAGNOSIS — R051 Acute cough: Secondary | ICD-10-CM | POA: Diagnosis not present

## 2020-02-12 LAB — TROPONIN I (HIGH SENSITIVITY)
Troponin I (High Sensitivity): <2 ng/L (ref <18)
Troponin I (High Sensitivity): <2 ng/L (ref <18)

## 2020-02-12 LAB — CBC
HCT: 42.7 % (ref 36.0–46.0)
Hemoglobin: 14.1 g/dL (ref 12.0–15.0)
MCH: 32.2 pg (ref 26.0–34.0)
MCHC: 33 g/dL (ref 30.0–36.0)
MCV: 97.5 fL (ref 80.0–100.0)
Platelets: 287 10*3/uL (ref 150–400)
RBC: 4.38 MIL/uL (ref 3.87–5.11)
RDW: 13.2 % (ref 11.5–15.5)
WBC: 4.9 10*3/uL (ref 4.0–10.5)
nRBC: 0 % (ref 0.0–0.2)

## 2020-02-12 LAB — BASIC METABOLIC PANEL
Anion gap: 9 (ref 5–15)
BUN: 14 mg/dL (ref 6–20)
CO2: 21 mmol/L — ABNORMAL LOW (ref 22–32)
Calcium: 9 mg/dL (ref 8.9–10.3)
Chloride: 107 mmol/L (ref 98–111)
Creatinine, Ser: 0.75 mg/dL (ref 0.44–1.00)
GFR, Estimated: >60 mL/min (ref >60)
Glucose, Bld: 122 mg/dL — ABNORMAL HIGH (ref 70–99)
Potassium: 4.7 mmol/L (ref 3.5–5.1)
Sodium: 137 mmol/L (ref 135–145)

## 2020-02-12 LAB — HEPATIC FUNCTION PANEL
ALT: 19 U/L (ref 0–44)
AST: 22 U/L (ref 15–41)
Albumin: 4 g/dL (ref 3.5–5.0)
Alkaline Phosphatase: 43 U/L (ref 38–126)
Bilirubin, Direct: <0.1 mg/dL (ref 0.0–0.2)
Total Bilirubin: 0.6 mg/dL (ref 0.3–1.2)
Total Protein: 7.1 g/dL (ref 6.5–8.1)

## 2020-02-12 LAB — LIPASE, BLOOD: Lipase: 29 U/L (ref 11–51)

## 2020-02-12 LAB — I-STAT BETA HCG BLOOD, ED (MC, WL, AP ONLY): I-stat hCG, quantitative: <5 m[IU]/mL (ref <5)

## 2020-02-12 MED ORDER — ALBUTEROL SULFATE HFA 108 (90 BASE) MCG/ACT IN AERS
2.0000 | INHALATION_SPRAY | Freq: Once | RESPIRATORY_TRACT | Status: AC
  Administered 2020-02-12: 2 via RESPIRATORY_TRACT
  Filled 2020-02-12: qty 6.7

## 2020-02-12 MED ORDER — ALUM & MAG HYDROXIDE-SIMETH 200-200-20 MG/5ML PO SUSP
30.0000 mL | Freq: Once | ORAL | Status: AC
  Administered 2020-02-12: 30 mL via ORAL
  Filled 2020-02-12: qty 30

## 2020-02-12 MED ORDER — ONDANSETRON HCL 4 MG/2ML IJ SOLN
4.0000 mg | Freq: Once | INTRAMUSCULAR | Status: AC
  Administered 2020-02-12: 4 mg via INTRAVENOUS
  Filled 2020-02-12: qty 2

## 2020-02-12 MED ORDER — FAMOTIDINE 20 MG PO TABS: 20.0000 mg | ORAL_TABLET | Freq: Two times a day (BID) | ORAL | 0 refills | Status: DC

## 2020-02-12 NOTE — ED Provider Notes (Signed)
[facility]   CSN: [id number] Arrival date & time: 02/12/20  1001     History Chief Complaint  Patient presents with  . Chest Pain  . Cough    Marissa Smith is a 34 y.o. female.  HPI  HPI: A 34 year old patient with a history of obesity presents for evaluation of chest pain. Initial onset of pain was more than 6 hours ago. The patient's chest pain is described as heaviness/pressure/tightness and is not worse with exertion. The patient complains of nausea. The patient's chest pain is middle- or left-sided, is not well-localized, is not sharp and does radiate to the arms/jaw/neck. The patient denies diaphoresis. The patient has smoked in the past 90 days and has a family history of coronary artery disease in a first-degree relative with onset less than age 36. The patient has no history of stroke, has no history of peripheral artery disease, denies any history of treated diabetes, is not hypertensive and has no history of hypercholesterolemia. Pt is a 34 y/o female with a h/o chronic back pain, allergies, bipolar disorder, HA, who presents to the ED today for eval of chest pain. Chest pain started last week which started intermittently but became constant for the last several days. States pain located on the left side of the chest. States pain feels like a tight, gripping, burning pain. Pain radiates to the neck and left shoulder. When she lifts up her left shoulder the pain is worse. She has also had some nausea when she eats and some intermittent abd pain.  Also reports headaches, body aches (chronic), and fatigue. She further reports cough, congestion for the last 1-2 weeks when the weather became colder. She has intermittent shortness of breath.  Denies fever.  Denies leg pain/swelling, hemoptysis, recent surgery/trauma, recent long travel, hormone use, personal hx of cancer, or hx of DVT/PE.   Father had MI in his 6840s   Past Medical  History:  Diagnosis Date  . Back pain, chronic   . Cat allergies   . Depressed bipolar I disorder (HCC)   . Headache   . Seasonal allergies     Patient Active Problem List   Diagnosis Date Noted  . Encounter for IUD insertion 08/14/2015  . Dysmenorrhea 10/31/2012  . Abnormal uterine bleeding (AUB) 10/31/2012    Past Surgical History:  Procedure Laterality Date  . CHOLECYSTECTOMY  2010   lap chole     OB History    Gravida  4   Para  0   Term  0   Preterm  0   AB  1   Living  3     SAB  0   TAB  0   Ectopic  0   Multiple  0   Live Births  3           Family History  Problem Relation Age of Onset  . Healthy Mother   . Heart failure Father     Social History   Tobacco Use  . Smoking status: Heavy Tobacco Smoker    Packs/day: 0.50    Types: Cigarettes  . Smokeless tobacco: Never Used  Vaping Use  . Vaping Use: Never used  Substance Use Topics  . Alcohol use: Yes    Alcohol/week: 0.0 standard drinks    Comment: Once a month  . Drug use: No    Home Medications Prior to Admission medications   Medication Sig Start Date End Date Taking? Authorizing  Provider  acetaminophen (TYLENOL) 325 MG tablet Take 650 mg by mouth every 6 (six) hours as needed.    [provider]  albuterol (PROVENTIL HFA;VENTOLIN HFA) 108 (90 Base) MCG/ACT inhaler Inhale 1-2 puffs into the lungs every 6 (six) hours as needed for wheezing or shortness of breath. 04/10/18   Elson Areas, PA-C  azithromycin (ZITHROMAX) 250 MG tablet Take 1 tablet (250 mg total) by mouth daily. Take first 2 tablets together, then 1 every day until finished. Patient not taking: Reported on 06/06/2018 04/10/18   Elson Areas, PA-C  famotidine (PEPCID) 20 MG tablet Take 1 tablet (20 mg total) by mouth 2 (two) times daily. 02/12/20   Kamile Fassler S, PA-C  HYDROcodone-acetaminophen (NORCO/VICODIN) 5-325 MG tablet Take 1 tablet by mouth every 6 (six) hours as needed for severe pain.  07/15/19   Georgetta Haber, NP  hydrOXYzine (ATARAX/VISTARIL) 25 MG tablet Take 1 tablet (25 mg total) by mouth every 6 (six) hours. 1 tab HS 11/01/19   Frederica Kuster, MD  ibuprofen (ADVIL) 800 MG tablet Take 1 tablet (800 mg total) by mouth every 8 (eight) hours as needed. 07/09/19   Lawyer, Cristal Deer, PA-C  loratadine (CLARITIN) 10 MG tablet Take 1 tablet (10 mg total) by mouth daily. 01/17/19   Brock Bad, MD  medroxyPROGESTERone (DEPO-PROVERA) 150 MG/ML injection Inject 1 mL (150 mg total) into the muscle every 3 (three) months. 01/16/19   Brock Bad, MD  meloxicam (MOBIC) 15 MG tablet Take 1 tablet (15 mg total) by mouth daily. 07/15/19   Georgetta Haber, NP  methylPREDNISolone (MEDROL) 4 MG tablet Begin with 6 tablets on day, decrease by 1 tablet each day 01/23/19   Wieters, Hallie C, PA-C  Multiple Vitamin (MULTIVITAMIN WITH MINERALS) TABS tablet Take 2 tablets by mouth daily.    [provider]  oxyCODONE-acetaminophen (PERCOCET) 10-325 MG tablet Take 1 tablet by mouth every 4 (four) hours as needed for pain. 10/18/18   Brock Bad, MD  PARoxetine (PAXIL) 20 MG tablet Take 1 tablet (20 mg total) by mouth daily. 10/18/18   Brock Bad, MD  phenylephrine-shark liver oil-mineral oil-petrolatum (PREPARATION H) 0.25-14-74.9 % rectal ointment Place 1 application rectally 2 (two) times daily as needed for hemorrhoids.    [provider]  predniSONE (DELTASONE) 50 MG tablet Take 1 tablet (50 mg total) by mouth daily. Patient not taking: Reported on 06/06/2018 04/10/18   Elson Areas, PA-C  Prenat-FeFmCb-DSS-FA-DHA w/o A (CITRANATAL HARMONY) 27-1-260 MG CAPS Take 1 capsule by mouth daily. 10/18/18   Brock Bad, MD  Prenatal-DSS-FeCb-FeGl-FA (CITRANATAL BLOOM) 90-1 MG TABS Take 1 tablet by mouth daily. 10/20/18   Brock Bad, MD  promethazine (PHENERGAN) 25 MG tablet Take 1 tablet (25 mg total) by mouth every 6 (six) hours as needed for nausea. 01/17/19    Brock Bad, MD  traMADol (ULTRAM) 50 MG tablet Take 1 tablet (50 mg total) by mouth every 6 (six) hours as needed for severe pain. 07/09/19   Lawyer, Cristal Deer, PA-C  triamcinolone cream (KENALOG) 0.1 % Apply 1 application topically 2 (two) times daily. 11/01/19   Frederica Kuster, MD  witch hazel-glycerin (TUCKS) pad Apply 1 application topically as needed for itching.    [provider]    Allergies    Darvocet [propoxyphene n-acetaminophen] and Miconazole nitrate  Review of Systems   Review of Systems  Constitutional: Negative for fever.  HENT: Negative for ear pain and  sore throat.   Eyes: Negative for visual disturbance.  Respiratory: Negative for cough and shortness of breath.   Cardiovascular: Positive for chest pain.  Gastrointestinal: Positive for abdominal pain and nausea. Negative for diarrhea and vomiting.  Genitourinary: Negative for dysuria and hematuria.  Musculoskeletal: Negative for back pain.  Skin: Negative for color change and rash.  Neurological: Negative for syncope and headaches.  All other systems reviewed and are negative.   Physical Exam Updated Vital Signs BP 109/73   Pulse 69   Temp 98.5 F (36.9 C) (Oral)   Resp 14   Ht 5\' 4"  (1.626 m)   Wt 81.6 kg   LMP 01/29/2020   SpO2 99%   BMI 30.90 kg/m   Physical Exam Vitals and nursing note reviewed.  Constitutional:      General: She is not in acute distress.    Appearance: She is well-developed.  HENT:     Head: Normocephalic and atraumatic.  Eyes:     Conjunctiva/sclera: Conjunctivae normal.  Cardiovascular:     Rate and Rhythm: Normal rate and regular rhythm.     Heart sounds: Normal heart sounds. No murmur heard.   Pulmonary:     Effort: Pulmonary effort is normal. No respiratory distress.     Breath sounds: Normal breath sounds. No decreased breath sounds, wheezing, rhonchi or rales.     Comments: Dry cough Abdominal:     Palpations: Abdomen is soft.     Tenderness:  There is no abdominal tenderness.  Musculoskeletal:     Cervical back: Neck supple.     Right lower leg: No tenderness. No edema.     Left lower leg: No tenderness. No edema.  Skin:    General: Skin is warm and dry.  Neurological:     Mental Status: She is alert.     ED Results / Procedures / Treatments   Labs (all labs ordered are listed, but only abnormal results are displayed) Labs Reviewed  BASIC METABOLIC PANEL - Abnormal; Notable for the following components:      Result Value   CO2 21 (*)    Glucose, Bld 122 (*)    All other components within normal limits  CBC  HEPATIC FUNCTION PANEL  LIPASE, BLOOD  I-STAT BETA HCG BLOOD, ED ([facility], [facility], [facility] ONLY)  TROPONIN I (HIGH SENSITIVITY)  TROPONIN I (HIGH SENSITIVITY)    EKG None  Radiology DG Chest 2 View  Result Date: 02/12/2020 CLINICAL DATA:  Central chest pain and cough for past month, intermittent nausea EXAM: CHEST - 2 VIEW COMPARISON:  03/01/2018 FINDINGS: Normal heart size, mediastinal contours, and pulmonary vascularity. Lungs clear. No pleural effusion or pneumothorax. Bones unremarkable. IMPRESSION: Normal exam. Electronically Signed   By: 03/03/2018 M.D.   On: 02/12/2020 10:35    Procedures Procedures (including critical care time)  Medications Ordered in ED Medications  albuterol (VENTOLIN HFA) 108 (90 Base) MCG/ACT inhaler 2 puff (2 puffs Inhalation Given 02/12/20 2041)  ondansetron (ZOFRAN) injection 4 mg (4 mg Intravenous Given 02/12/20 2041)  alum & mag hydroxide-simeth (MAALOX/MYLANTA) 200-200-20 MG/5ML suspension 30 mL (30 mLs Oral Given 02/12/20 2042)    ED Course  I have reviewed the triage vital signs and the nursing notes.  Pertinent labs & imaging results that were available during my care of the patient were reviewed by me and considered in my medical decision making (see chart for details).    MDM Rules/Calculators/A&P HEAR Score: 4  34 year old female presenting for  evaluation of chest pain.  Reviewed/interpreted labs CBC and BMP are unremarkable.  Delta troponins are negative.  Beta-hCG is negative LFTs and lipase are wnl  EKG shows normal sinus rhythm, no acute ischemic changes  Chest x-ray reviewed/interpreted and is unremarkable  Patient was given a GI cocktail, albuterol in the emergency department.  On reassessment she feels some improvement.  She still has some pain in her chest.  The pain is reproducible on palpation.  I have low suspicion for ACS.  Her heart score is 4.  She does have strong family history but her symptoms today do not seem consistent with ACS therefore will give her a referral to cardiology.  Her symptoms could also be related to acid reflux I will start her on some Pepcid for home.  Have advised close monitoring of symptoms and strict return precautions.  She voices understanding the plan and reasons to return.  All Questions answered.  Patient stable for discharge.   Final Clinical Impression(s) / ED Diagnoses Final diagnoses:  Atypical chest pain    Rx / DC Orders ED Discharge Orders         Ordered    Ambulatory referral to Cardiology        02/12/20 2117    famotidine (PEPCID) 20 MG tablet  2 times daily        02/12/20 2117           Rayne Du 02/12/20 2117    Lorre Nick, MD 02/12/20 2317

## 2020-02-12 NOTE — ED Triage Notes (Signed)
Pt reports centralized chest pain and cough for the past month. Intermittent nausea. Denies fever, pt has had both COVID vaccines.

## 2020-02-12 NOTE — Discharge Instructions (Signed)
Take pepcid as prescribed.   You were given an ambulatory referral to follow-up with a cardiologist.  They should be reaching out to you to schedule an appointment for follow-up.  Please also make an appointment follow-up with your regular doctor in about a week and return to the emergency department for new or worsening symptoms.

## 2020-03-11 ENCOUNTER — Ambulatory Visit: Payer: Medicaid Other | Admitting: Internal Medicine

## 2020-03-11 NOTE — Progress Notes (Deleted)
Cardiology Office Note:    Date:  03/11/2020   ID:  Marissa Smith, DOB 1985-12-07, MRN [id number]  PCP:  Patient, No Pcp Per  [facility] Cardiologist:  No primary care provider on file.  [facility] Electrophysiologist:  None   CC: *** Consulted for the evaluation of possibly cardiac chest pain at the behest of Patient, No Pcp Per   History of Present Illness:    Marissa Smith is a 34 y.o. female with a presentation of chest pain.  Patient notes ***  Ambulatory BP ***   Past Medical History:  Diagnosis Date  . Back pain, chronic   . Cat allergies   . Depressed bipolar I disorder (HCC)   . Headache   . Seasonal allergies     Past Surgical History:  Procedure Laterality Date  . CHOLECYSTECTOMY  2010   lap chole    Current Medications: No outpatient medications have been marked as taking for the 03/11/20 encounter (Appointment) with Christell Constant, MD.     Allergies:   Darvocet [propoxyphene n-acetaminophen] and Miconazole nitrate   Social History   Socioeconomic History  . Marital status: Single    Spouse name: Not on file  . Number of children: Not on file  . Years of education: Not on file  . Highest education level: Not on file  Occupational History  . Not on file  Tobacco Use  . Smoking status: Heavy Tobacco Smoker    Packs/day: 0.50    Types: Cigarettes  . Smokeless tobacco: Never Used  Vaping Use  . Vaping Use: Never used  Substance and Sexual Activity  . Alcohol use: Yes    Alcohol/week: 0.0 standard drinks    Comment: Once a month  . Drug use: No  . Sexual activity: Yes    Partners: Male  Other Topics Concern  . Not on file  Social History Narrative  . Not on file   Social Determinants of Health   Financial Resource Strain:   . Difficulty of Paying Living Expenses: Not on file  Food Insecurity:   . Worried About Programme researcher, broadcasting/film/video in the Last Year: Not on file  . Ran Out of Food in the Last Year: Not on file   Transportation Needs:   . Lack of Transportation (Medical): Not on file  . Lack of Transportation (Non-Medical): Not on file  Physical Activity:   . Days of Exercise per Week: Not on file  . Minutes of Exercise per Session: Not on file  Stress:   . Feeling of Stress : Not on file  Social Connections:   . Frequency of Communication with Friends and Family: Not on file  . Frequency of Social Gatherings with Friends and Family: Not on file  . Attends Religious Services: Not on file  . Active Member of Clubs or Organizations: Not on file  . Attends Banker Meetings: Not on file  . Marital Status: Not on file     Family History: The patient's ***family history includes Healthy in her mother; Heart failure in her father.  ROS:   Please see the history of present illness.    *** All other systems reviewed and are negative.  EKGs/Labs/Other Studies Reviewed:    The following studies were reviewed today:  EKG:  EKG is *** ordered today.  The ekg ordered today demonstrates *** 02/13/20 sinus 72 no ST/T changes or q waves  Recent Labs: 02/12/2020: ALT 19; BUN 14; Creatinine, Ser 0.75;  Hemoglobin 14.1; Platelets 287; Potassium 4.7; Sodium 137  Recent Lipid Panel No results found for: CHOL, TRIG, HDL, CHOLHDL, VLDL, LDLCALC, LDLDIRECT   Risk Assessment/Calculations:       Physical Exam:    VS:  There were no vitals taken for this visit.    Wt Readings from Last 3 Encounters:  02/12/20 180 lb (81.6 kg)  11/01/19 178 lb (80.7 kg)  07/15/19 180 lb (81.6 kg)     GEN: *** Well nourished, well developed in no acute distress HEENT: Normal NECK: No JVD; No carotid bruits LYMPHATICS: No lymphadenopathy CARDIAC: ***RRR, no murmurs, rubs, gallops RESPIRATORY:  Clear to auscultation without rales, wheezing or rhonchi  ABDOMEN: Soft, non-tender, non-distended MUSCULOSKELETAL:  No edema; No deformity  SKIN: Warm and dry NEUROLOGIC:  Alert and oriented x 3 PSYCHIATRIC:   Normal affect   ASSESSMENT:    No diagnosis found. PLAN:    In order of problems listed above:  Chest Pain - The patient presents with cardiac/possibly cardiac/non-cardiac *** - EKG shows *** without evidence of accessory pathway, ventricular pacing, digoxin use, LBBB, or baseline ST changes. - CVD risk factors include ***.  - ASCVD risk estimated at *** - Please obtain lipid profile, A1c, and TSH.  - Remain on telemetry and obtain a stat EKG should CP worsen or change in character/quality.  - Please give 325 mg ASA (if not already given) and continue ASA 81 mg QD, statin, and beta blocker therapies.  - Sublingual nitroglycerin as need for chest pain. *** - Would recommend an echocardiogram to assess LVEF and exclude WMA.  - Would recommend CCTA to exclude obstructive CAD  - Given prior history of revascularization, would recommend a nuclear medicine stress test (NPO at midnight/hold beta blocker in AM); discussed risks, benefits, and alternatives of the diagnostic procedure including chest pain, arrhythmia, and death.  Patient amenable for testing. - if positive, discussed risks and benefits of cardiac catheterization have been discussed with the patient.  These include bleeding, infection, kidney damage, stroke, heart attack, death.  The patient understands these risks and is willing to proceed if necessary     Shared Decision Making/Informed Consent   {Are you ordering a CV Procedure (e.g. stress test, cath, DCCV, TEE, etc)?   Press F2        :[id number]}    Medication Adjustments/Labs and Tests Ordered: Current medicines are reviewed at length with the patient today.  Concerns regarding medicines are outlined above.  No orders of the defined types were placed in this encounter.  No orders of the defined types were placed in this encounter.   There are no Patient Instructions on file for this visit.   Signed, Christell Constant, MD  03/11/2020 8:03 AM    [facility]

## 2020-03-20 NOTE — Progress Notes (Deleted)
Cardiology Office Note:    Date:  03/20/2020   ID:  Marissa Smith, DOB 1985-10-14, MRN [id number]  PCP:  Patient, No Pcp Per  Cardiologist:  No primary care provider on file.  Electrophysiologist:  None   Referring MD: Karrie Meres, PA-C   No chief complaint on file. ***  History of Present Illness:    Marissa Smith is a 34 y.o. female with a hx of obesity, and PPD who presents as an ED follow-up for chest pain.  She was seen in [facility] ED on 02/12/2020 with chest pain.  Pain was reproducible on palpation.  No ischemic EKG changes.  High-sensitivity troponin negative x2.  Past Medical History:  Diagnosis Date  . Back pain, chronic   . Cat allergies   . Depressed bipolar I disorder (HCC)   . Headache   . Seasonal allergies     Past Surgical History:  Procedure Laterality Date  . CHOLECYSTECTOMY  2010   lap chole    Current Medications: No outpatient medications have been marked as taking for the 03/21/20 encounter (Appointment) with Little Ishikawa, MD.     Allergies:   Darvocet [propoxyphene n-acetaminophen] and Miconazole nitrate   Social History   Socioeconomic History  . Marital status: Single    Spouse name: Not on file  . Number of children: Not on file  . Years of education: Not on file  . Highest education level: Not on file  Occupational History  . Not on file  Tobacco Use  . Smoking status: Heavy Tobacco Smoker    Packs/day: 0.50    Types: Cigarettes  . Smokeless tobacco: Never Used  Vaping Use  . Vaping Use: Never used  Substance and Sexual Activity  . Alcohol use: Yes    Alcohol/week: 0.0 standard drinks    Comment: Once a month  . Drug use: No  . Sexual activity: Yes    Partners: Male  Other Topics Concern  . Not on file  Social History Narrative  . Not on file   Social Determinants of Health   Financial Resource Strain:   . Difficulty of Paying Living Expenses: Not on file  Food Insecurity:   . Worried About Brewing technologist in the Last Year: Not on file  . Ran Out of Food in the Last Year: Not on file  Transportation Needs:   . Lack of Transportation (Medical): Not on file  . Lack of Transportation (Non-Medical): Not on file  Physical Activity:   . Days of Exercise per Week: Not on file  . Minutes of Exercise per Session: Not on file  Stress:   . Feeling of Stress : Not on file  Social Connections:   . Frequency of Communication with Friends and Family: Not on file  . Frequency of Social Gatherings with Friends and Family: Not on file  . Attends Religious Services: Not on file  . Active Member of Clubs or Organizations: Not on file  . Attends Banker Meetings: Not on file  . Marital Status: Not on file     Family History: The patient's ***family history includes Healthy in her mother; Heart failure in her father.  ROS:   Please see the history of present illness.    *** All other systems reviewed and are negative.  EKGs/Labs/Other Studies Reviewed:    The following studies were reviewed today: ***  EKG:  EKG is *** ordered today.  The ekg ordered today demonstrates ***  Recent Labs: 02/12/2020: ALT 19; BUN 14; Creatinine, Ser 0.75; Hemoglobin 14.1; Platelets 287; Potassium 4.7; Sodium 137  Recent Lipid Panel No results found for: CHOL, TRIG, HDL, CHOLHDL, VLDL, LDLCALC, LDLDIRECT  Physical Exam:    VS:  There were no vitals taken for this visit.    Wt Readings from Last 3 Encounters:  02/12/20 180 lb (81.6 kg)  11/01/19 178 lb (80.7 kg)  07/15/19 180 lb (81.6 kg)     GEN: *** Well nourished, well developed in no acute distress HEENT: Normal NECK: No JVD; No carotid bruits LYMPHATICS: No lymphadenopathy CARDIAC: ***RRR, no murmurs, rubs, gallops RESPIRATORY:  Clear to auscultation without rales, wheezing or rhonchi  ABDOMEN: Soft, non-tender, non-distended MUSCULOSKELETAL:  No edema; No deformity  SKIN: Warm and dry NEUROLOGIC:  Alert and oriented x  3 PSYCHIATRIC:  Normal affect   ASSESSMENT:    No diagnosis found. PLAN:    Chest pain:   RTC in***  Medication Adjustments/Labs and Tests Ordered: Current medicines are reviewed at length with the patient today.  Concerns regarding medicines are outlined above.  No orders of the defined types were placed in this encounter.  No orders of the defined types were placed in this encounter.   There are no Patient Instructions on file for this visit.   Signed, Little Ishikawa, MD  03/20/2020 11:46 PM    [facility]

## 2020-03-21 ENCOUNTER — Ambulatory Visit: Payer: Medicaid Other | Admitting: Cardiology

## 2020-04-13 ENCOUNTER — Emergency Department (HOSPITAL_COMMUNITY): Payer: Medicaid Other

## 2020-04-13 ENCOUNTER — Encounter (HOSPITAL_COMMUNITY): Payer: Self-pay

## 2020-04-13 ENCOUNTER — Inpatient Hospital Stay (HOSPITAL_COMMUNITY)
Admission: EM | Admit: 2020-04-13 | Discharge: 2020-04-24 | DRG: 958 | Disposition: A | Discharge status: Rehabilitation Facility | Payer: Medicaid Other | Attending: Surgery | Admitting: Surgery

## 2020-04-13 ENCOUNTER — Other Ambulatory Visit: Payer: Self-pay

## 2020-04-13 DIAGNOSIS — D62 Acute posthemorrhagic anemia: Secondary | ICD-10-CM | POA: Diagnosis not present

## 2020-04-13 DIAGNOSIS — S32422A Displaced fracture of posterior wall of left acetabulum, initial encounter for closed fracture: Secondary | ICD-10-CM | POA: Diagnosis present

## 2020-04-13 DIAGNOSIS — G8918 Other acute postprocedural pain: Secondary | ICD-10-CM | POA: Diagnosis not present

## 2020-04-13 DIAGNOSIS — S27322A Contusion of lung, bilateral, initial encounter: Secondary | ICD-10-CM | POA: Diagnosis present

## 2020-04-13 DIAGNOSIS — S32512A Fracture of superior rim of left pubis, initial encounter for closed fracture: Secondary | ICD-10-CM | POA: Diagnosis present

## 2020-04-13 DIAGNOSIS — Z20822 Contact with and (suspected) exposure to covid-19: Secondary | ICD-10-CM | POA: Diagnosis present

## 2020-04-13 DIAGNOSIS — M792 Neuralgia and neuritis, unspecified: Secondary | ICD-10-CM | POA: Diagnosis not present

## 2020-04-13 DIAGNOSIS — R339 Retention of urine, unspecified: Secondary | ICD-10-CM | POA: Diagnosis not present

## 2020-04-13 DIAGNOSIS — T1490XA Injury, unspecified, initial encounter: Secondary | ICD-10-CM | POA: Diagnosis present

## 2020-04-13 DIAGNOSIS — K5903 Drug induced constipation: Secondary | ICD-10-CM | POA: Diagnosis not present

## 2020-04-13 DIAGNOSIS — G90522 Complex regional pain syndrome I of left lower limb: Secondary | ICD-10-CM | POA: Diagnosis not present

## 2020-04-13 DIAGNOSIS — S2241XA Multiple fractures of ribs, right side, initial encounter for closed fracture: Secondary | ICD-10-CM | POA: Diagnosis present

## 2020-04-13 DIAGNOSIS — E46 Unspecified protein-calorie malnutrition: Secondary | ICD-10-CM | POA: Diagnosis not present

## 2020-04-13 DIAGNOSIS — Z79899 Other long term (current) drug therapy: Secondary | ICD-10-CM

## 2020-04-13 DIAGNOSIS — S52009B Unspecified fracture of upper end of unspecified ulna, initial encounter for open fracture type I or II: Secondary | ICD-10-CM

## 2020-04-13 DIAGNOSIS — J939 Pneumothorax, unspecified: Secondary | ICD-10-CM

## 2020-04-13 DIAGNOSIS — Z87828 Personal history of other (healed) physical injury and trauma: Secondary | ICD-10-CM | POA: Diagnosis not present

## 2020-04-13 DIAGNOSIS — S270XXA Traumatic pneumothorax, initial encounter: Secondary | ICD-10-CM | POA: Diagnosis present

## 2020-04-13 DIAGNOSIS — S92001A Unspecified fracture of right calcaneus, initial encounter for closed fracture: Secondary | ICD-10-CM | POA: Diagnosis present

## 2020-04-13 DIAGNOSIS — Z888 Allergy status to other drugs, medicaments and biological substances status: Secondary | ICD-10-CM

## 2020-04-13 DIAGNOSIS — S52022B Displaced fracture of olecranon process without intraarticular extension of left ulna, initial encounter for open fracture type I or II: Secondary | ICD-10-CM | POA: Diagnosis present

## 2020-04-13 DIAGNOSIS — Z8249 Family history of ischemic heart disease and other diseases of the circulatory system: Secondary | ICD-10-CM

## 2020-04-13 DIAGNOSIS — S32409A Unspecified fracture of unspecified acetabulum, initial encounter for closed fracture: Secondary | ICD-10-CM

## 2020-04-13 DIAGNOSIS — Z791 Long term (current) use of non-steroidal anti-inflammatories (NSAID): Secondary | ICD-10-CM

## 2020-04-13 DIAGNOSIS — T07XXXA Unspecified multiple injuries, initial encounter: Secondary | ICD-10-CM

## 2020-04-13 DIAGNOSIS — G8929 Other chronic pain: Secondary | ICD-10-CM | POA: Diagnosis present

## 2020-04-13 DIAGNOSIS — T148XXA Other injury of unspecified body region, initial encounter: Secondary | ICD-10-CM | POA: Diagnosis not present

## 2020-04-13 DIAGNOSIS — Z4682 Encounter for fitting and adjustment of non-vascular catheter: Secondary | ICD-10-CM

## 2020-04-13 DIAGNOSIS — S32402A Unspecified fracture of left acetabulum, initial encounter for closed fracture: Secondary | ICD-10-CM

## 2020-04-13 DIAGNOSIS — M7989 Other specified soft tissue disorders: Secondary | ICD-10-CM | POA: Diagnosis not present

## 2020-04-13 DIAGNOSIS — G5702 Lesion of sciatic nerve, left lower limb: Secondary | ICD-10-CM | POA: Diagnosis present

## 2020-04-13 DIAGNOSIS — M545 Low back pain, unspecified: Secondary | ICD-10-CM | POA: Diagnosis present

## 2020-04-13 DIAGNOSIS — Z419 Encounter for procedure for purposes other than remedying health state, unspecified: Secondary | ICD-10-CM

## 2020-04-13 DIAGNOSIS — Y92481 Parking lot as the place of occurrence of the external cause: Secondary | ICD-10-CM

## 2020-04-13 DIAGNOSIS — R52 Pain, unspecified: Secondary | ICD-10-CM | POA: Diagnosis not present

## 2020-04-13 DIAGNOSIS — S52232A Displaced oblique fracture of shaft of left ulna, initial encounter for closed fracture: Secondary | ICD-10-CM

## 2020-04-13 DIAGNOSIS — Z720 Tobacco use: Secondary | ICD-10-CM | POA: Diagnosis not present

## 2020-04-13 DIAGNOSIS — S52002B Unspecified fracture of upper end of left ulna, initial encounter for open fracture type I or II: Secondary | ICD-10-CM | POA: Diagnosis present

## 2020-04-13 DIAGNOSIS — S73015A Posterior dislocation of left hip, initial encounter: Secondary | ICD-10-CM | POA: Diagnosis present

## 2020-04-13 DIAGNOSIS — Z23 Encounter for immunization: Secondary | ICD-10-CM | POA: Diagnosis not present

## 2020-04-13 DIAGNOSIS — Z885 Allergy status to narcotic agent status: Secondary | ICD-10-CM

## 2020-04-13 DIAGNOSIS — M79605 Pain in left leg: Secondary | ICD-10-CM | POA: Diagnosis not present

## 2020-04-13 DIAGNOSIS — M5442 Lumbago with sciatica, left side: Secondary | ICD-10-CM | POA: Diagnosis not present

## 2020-04-13 DIAGNOSIS — E8809 Other disorders of plasma-protein metabolism, not elsewhere classified: Secondary | ICD-10-CM | POA: Diagnosis not present

## 2020-04-13 DIAGNOSIS — S82891A Other fracture of right lower leg, initial encounter for closed fracture: Secondary | ICD-10-CM

## 2020-04-13 DIAGNOSIS — F17210 Nicotine dependence, cigarettes, uncomplicated: Secondary | ICD-10-CM | POA: Diagnosis present

## 2020-04-13 DIAGNOSIS — F3130 Bipolar disorder, current episode depressed, mild or moderate severity, unspecified: Secondary | ICD-10-CM | POA: Diagnosis present

## 2020-04-13 DIAGNOSIS — G5772 Causalgia of left lower limb: Secondary | ICD-10-CM | POA: Diagnosis not present

## 2020-04-13 LAB — CBC
HCT: 41 % (ref 36.0–46.0)
Hemoglobin: 13.6 g/dL (ref 12.0–15.0)
MCH: 31.5 pg (ref 26.0–34.0)
MCHC: 33.2 g/dL (ref 30.0–36.0)
MCV: 94.9 fL (ref 80.0–100.0)
Platelets: 237 10*3/uL (ref 150–400)
RBC: 4.32 MIL/uL (ref 3.87–5.11)
RDW: 13 % (ref 11.5–15.5)
WBC: 14.8 10*3/uL — ABNORMAL HIGH (ref 4.0–10.5)
nRBC: 0.1 % (ref 0.0–0.2)

## 2020-04-13 LAB — COMPREHENSIVE METABOLIC PANEL
ALT: 103 U/L — ABNORMAL HIGH (ref 0–44)
AST: 230 U/L — ABNORMAL HIGH (ref 15–41)
Albumin: 3.9 g/dL (ref 3.5–5.0)
Alkaline Phosphatase: 42 U/L (ref 38–126)
Anion gap: 13 (ref 5–15)
BUN: 16 mg/dL (ref 6–20)
CO2: 17 mmol/L — ABNORMAL LOW (ref 22–32)
Calcium: 9 mg/dL (ref 8.9–10.3)
Chloride: 105 mmol/L (ref 98–111)
Creatinine, Ser: 0.78 mg/dL (ref 0.44–1.00)
GFR, Estimated: >60 mL/min (ref >60)
Glucose, Bld: 136 mg/dL — ABNORMAL HIGH (ref 70–99)
Potassium: 3.7 mmol/L (ref 3.5–5.1)
Sodium: 135 mmol/L (ref 135–145)
Total Bilirubin: 1 mg/dL (ref 0.3–1.2)
Total Protein: 6.7 g/dL (ref 6.5–8.1)

## 2020-04-13 LAB — I-STAT CHEM 8, ED
BUN: 17 mg/dL (ref 6–20)
Calcium, Ion: 1.16 mmol/L (ref 1.15–1.40)
Chloride: 107 mmol/L (ref 98–111)
Creatinine, Ser: 0.7 mg/dL (ref 0.44–1.00)
Glucose, Bld: 138 mg/dL — ABNORMAL HIGH (ref 70–99)
HCT: 41 % (ref 36.0–46.0)
Hemoglobin: 13.9 g/dL (ref 12.0–15.0)
Potassium: 3.6 mmol/L (ref 3.5–5.1)
Sodium: 137 mmol/L (ref 135–145)
TCO2: 20 mmol/L — ABNORMAL LOW (ref 22–32)

## 2020-04-13 LAB — ETHANOL: Alcohol, Ethyl (B): <10 mg/dL (ref <10)

## 2020-04-13 LAB — RESP PANEL BY RT-PCR (FLU A&B, COVID) ARPGX2
Influenza A by PCR: NEGATIVE
Influenza B by PCR: NEGATIVE
SARS Coronavirus 2 by RT PCR: NEGATIVE

## 2020-04-13 LAB — I-STAT BETA HCG BLOOD, ED (MC, WL, AP ONLY): I-stat hCG, quantitative: <5 m[IU]/mL (ref <5)

## 2020-04-13 MED ORDER — PROPOFOL 10 MG/ML IV BOLUS
1.0000 mg/kg | Freq: Once | INTRAVENOUS | Status: AC
  Administered 2020-04-14: 81.6 mg via INTRAVENOUS
  Filled 2020-04-13: qty 20

## 2020-04-13 MED ORDER — IOHEXOL 300 MG/ML  SOLN
100.0000 mL | Freq: Once | INTRAMUSCULAR | Status: AC | PRN
  Administered 2020-04-13: 100 mL via INTRAVENOUS

## 2020-04-13 MED ORDER — FAMOTIDINE 20 MG PO TABS
20.0000 mg | ORAL_TABLET | Freq: Two times a day (BID) | ORAL | Status: DC
  Administered 2020-04-13 – 2020-04-24 (×21): 20 mg via ORAL
  Filled 2020-04-13 (×21): qty 1

## 2020-04-13 MED ORDER — HYDROMORPHONE HCL 1 MG/ML IJ SOLN
1.0000 mg | Freq: Once | INTRAMUSCULAR | Status: AC
  Administered 2020-04-13: 1 mg via INTRAVENOUS
  Filled 2020-04-13: qty 1

## 2020-04-13 MED ORDER — METHOCARBAMOL 500 MG PO TABS
1000.0000 mg | ORAL_TABLET | Freq: Four times a day (QID) | ORAL | Status: DC
  Administered 2020-04-13 – 2020-04-24 (×39): 1000 mg via ORAL
  Filled 2020-04-13 (×39): qty 2

## 2020-04-13 MED ORDER — OXYCODONE HCL 5 MG PO TABS
5.0000 mg | ORAL_TABLET | ORAL | Status: DC | PRN
  Administered 2020-04-14: 10 mg via ORAL
  Filled 2020-04-13: qty 2

## 2020-04-13 MED ORDER — HYDROXYZINE HCL 25 MG PO TABS
25.0000 mg | ORAL_TABLET | Freq: Four times a day (QID) | ORAL | Status: DC | PRN
  Administered 2020-04-15 – 2020-04-19 (×6): 25 mg via ORAL
  Filled 2020-04-13 (×7): qty 1

## 2020-04-13 MED ORDER — CEFAZOLIN SODIUM-DEXTROSE 2-4 GM/100ML-% IV SOLN
2.0000 g | INTRAVENOUS | Status: AC
  Administered 2020-04-13: 2 g via INTRAVENOUS
  Filled 2020-04-13: qty 100

## 2020-04-13 MED ORDER — TETANUS-DIPHTH-ACELL PERTUSSIS 5-2.5-18.5 LF-MCG/0.5 IM SUSY
0.5000 mL | PREFILLED_SYRINGE | Freq: Once | INTRAMUSCULAR | Status: AC
  Administered 2020-04-13: 0.5 mL via INTRAMUSCULAR

## 2020-04-13 MED ORDER — PAROXETINE HCL 20 MG PO TABS
20.0000 mg | ORAL_TABLET | Freq: Every day | ORAL | Status: DC
  Administered 2020-04-14 – 2020-04-24 (×10): 20 mg via ORAL
  Filled 2020-04-13 (×12): qty 1

## 2020-04-13 MED ORDER — TETANUS-DIPHTH-ACELL PERTUSSIS 5-2.5-18.5 LF-MCG/0.5 IM SUSY
0.5000 mL | PREFILLED_SYRINGE | Freq: Once | INTRAMUSCULAR | Status: DC
  Filled 2020-04-13: qty 0.5

## 2020-04-13 MED ORDER — ONDANSETRON 4 MG PO TBDP: 4.0000 mg | ORAL_TABLET | Freq: Four times a day (QID) | ORAL | Status: DC | PRN

## 2020-04-13 MED ORDER — CEFAZOLIN SODIUM-DEXTROSE 1-4 GM/50ML-% IV SOLN
1.0000 g | Freq: Once | INTRAVENOUS | Status: DC
  Filled 2020-04-13: qty 50

## 2020-04-13 MED ORDER — GABAPENTIN 300 MG PO CAPS
300.0000 mg | ORAL_CAPSULE | Freq: Three times a day (TID) | ORAL | Status: DC
  Administered 2020-04-13: 300 mg via ORAL
  Filled 2020-04-13: qty 1

## 2020-04-13 MED ORDER — ONDANSETRON HCL 4 MG/2ML IJ SOLN: 4.0000 mg | Freq: Four times a day (QID) | INTRAMUSCULAR | Status: DC | PRN

## 2020-04-13 MED ORDER — ACETAMINOPHEN 500 MG PO TABS
1000.0000 mg | ORAL_TABLET | Freq: Three times a day (TID) | ORAL | Status: DC
  Administered 2020-04-13 – 2020-04-15 (×5): 1000 mg via ORAL
  Filled 2020-04-13 (×5): qty 2

## 2020-04-13 MED ORDER — DOCUSATE SODIUM 100 MG PO CAPS
100.0000 mg | ORAL_CAPSULE | Freq: Two times a day (BID) | ORAL | Status: DC
  Administered 2020-04-13 – 2020-04-24 (×20): 100 mg via ORAL
  Filled 2020-04-13 (×19): qty 1

## 2020-04-13 MED ORDER — HYDROMORPHONE HCL 1 MG/ML IJ SOLN: 0.5000 mg | INTRAMUSCULAR | Status: DC | PRN

## 2020-04-13 MED ORDER — ENOXAPARIN SODIUM 30 MG/0.3ML ~~LOC~~ SOLN
30.0000 mg | Freq: Two times a day (BID) | SUBCUTANEOUS | Status: DC
  Administered 2020-04-14: 30 mg via SUBCUTANEOUS
  Filled 2020-04-13: qty 0.3

## 2020-04-13 MED ORDER — HYDROMORPHONE HCL 1 MG/ML IJ SOLN
1.0000 mg | INTRAMUSCULAR | Status: AC | PRN
  Administered 2020-04-13 – 2020-04-14 (×2): 1 mg via INTRAVENOUS
  Filled 2020-04-13 (×2): qty 1

## 2020-04-13 MED ORDER — LACTATED RINGERS IV SOLN: INTRAVENOUS | Status: DC

## 2020-04-13 MED ORDER — ALBUTEROL SULFATE HFA 108 (90 BASE) MCG/ACT IN AERS
1.0000 | INHALATION_SPRAY | Freq: Four times a day (QID) | RESPIRATORY_TRACT | Status: DC | PRN
  Administered 2020-04-17: 2 via RESPIRATORY_TRACT
  Filled 2020-04-13: qty 6.7

## 2020-04-13 MED ORDER — HYDROMORPHONE HCL 1 MG/ML IJ SOLN
1.0000 mg | INTRAMUSCULAR | Status: DC | PRN
Start: 2020-04-13 — End: 2020-04-13

## 2020-04-13 MED ORDER — FENTANYL CITRATE (PF) 100 MCG/2ML IJ SOLN
100.0000 ug | Freq: Once | INTRAMUSCULAR | Status: AC
  Administered 2020-04-13: 100 ug via INTRAVENOUS
  Filled 2020-04-13: qty 2
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
## 2020-04-13 NOTE — ED Notes (Signed)
Pt to CT with TRN  
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
## 2020-04-13 NOTE — ED Provider Notes (Signed)
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 04/15/20 2335

## 2020-04-14 ENCOUNTER — Inpatient Hospital Stay (HOSPITAL_COMMUNITY): Payer: Medicaid Other

## 2020-04-14 DIAGNOSIS — S32402A Unspecified fracture of left acetabulum, initial encounter for closed fracture: Secondary | ICD-10-CM | POA: Insufficient documentation

## 2020-04-14 DIAGNOSIS — S52232A Displaced oblique fracture of shaft of left ulna, initial encounter for closed fracture: Secondary | ICD-10-CM | POA: Insufficient documentation

## 2020-04-14 DIAGNOSIS — S52009B Unspecified fracture of upper end of unspecified ulna, initial encounter for open fracture type I or II: Secondary | ICD-10-CM | POA: Insufficient documentation

## 2020-04-14 DIAGNOSIS — S2249XA Multiple fractures of ribs, unspecified side, initial encounter for closed fracture: Secondary | ICD-10-CM | POA: Insufficient documentation

## 2020-04-14 DIAGNOSIS — S92014A Nondisplaced fracture of body of right calcaneus, initial encounter for closed fracture: Secondary | ICD-10-CM | POA: Insufficient documentation

## 2020-04-14 LAB — BASIC METABOLIC PANEL
Anion gap: 10 (ref 5–15)
BUN: 12 mg/dL (ref 6–20)
CO2: 24 mmol/L (ref 22–32)
Calcium: 8.5 mg/dL — ABNORMAL LOW (ref 8.9–10.3)
Chloride: 102 mmol/L (ref 98–111)
Creatinine, Ser: 0.91 mg/dL (ref 0.44–1.00)
GFR, Estimated: >60 mL/min (ref >60)
Glucose, Bld: 134 mg/dL — ABNORMAL HIGH (ref 70–99)
Potassium: 4.1 mmol/L (ref 3.5–5.1)
Sodium: 136 mmol/L (ref 135–145)

## 2020-04-14 LAB — CBC
HCT: 34.8 % — ABNORMAL LOW (ref 36.0–46.0)
Hemoglobin: 11.9 g/dL — ABNORMAL LOW (ref 12.0–15.0)
MCH: 32.9 pg (ref 26.0–34.0)
MCHC: 34.2 g/dL (ref 30.0–36.0)
MCV: 96.1 fL (ref 80.0–100.0)
Platelets: 203 10*3/uL (ref 150–400)
RBC: 3.62 MIL/uL — ABNORMAL LOW (ref 3.87–5.11)
RDW: 13.3 % (ref 11.5–15.5)
WBC: 12.5 10*3/uL — ABNORMAL HIGH (ref 4.0–10.5)
nRBC: 0 % (ref 0.0–0.2)

## 2020-04-14 MED ORDER — GABAPENTIN 300 MG PO CAPS
600.0000 mg | ORAL_CAPSULE | Freq: Three times a day (TID) | ORAL | Status: DC
  Administered 2020-04-14 – 2020-04-19 (×14): 600 mg via ORAL
  Filled 2020-04-14 (×14): qty 2

## 2020-04-14 MED ORDER — OXYCODONE HCL 5 MG PO TABS
15.0000 mg | ORAL_TABLET | ORAL | Status: DC | PRN
  Administered 2020-04-14: 15 mg via ORAL
  Filled 2020-04-14: qty 3

## 2020-04-14 MED ORDER — GUAIFENESIN ER 600 MG PO TB12
600.0000 mg | ORAL_TABLET | Freq: Two times a day (BID) | ORAL | Status: DC
  Administered 2020-04-14 – 2020-04-22 (×16): 600 mg via ORAL
  Filled 2020-04-14 (×17): qty 1

## 2020-04-14 MED ORDER — KETOROLAC TROMETHAMINE 30 MG/ML IJ SOLN
30.0000 mg | Freq: Four times a day (QID) | INTRAMUSCULAR | Status: DC | PRN
  Administered 2020-04-14 – 2020-04-15 (×3): 30 mg via INTRAVENOUS
  Filled 2020-04-14 (×4): qty 1

## 2020-04-14 MED ORDER — OXYCODONE HCL 5 MG PO TABS
10.0000 mg | ORAL_TABLET | ORAL | Status: DC | PRN
  Administered 2020-04-14 – 2020-04-15 (×5): 15 mg via ORAL
  Filled 2020-04-14 (×5): qty 3

## 2020-04-14 MED ORDER — CEFAZOLIN SODIUM-DEXTROSE 2-4 GM/100ML-% IV SOLN
2.0000 g | Freq: Three times a day (TID) | INTRAVENOUS | Status: AC
  Administered 2020-04-14: 2 g via INTRAVENOUS
  Filled 2020-04-14 (×2): qty 100

## 2020-04-14 MED ORDER — DIAZEPAM 5 MG/ML IJ SOLN
5.0000 mg | Freq: Once | INTRAMUSCULAR | Status: AC
  Administered 2020-04-14: 5 mg via INTRAVENOUS
  Filled 2020-04-14: qty 2

## 2020-04-14 MED ORDER — LIDOCAINE 5 % EX PTCH
1.0000 | MEDICATED_PATCH | CUTANEOUS | Status: DC
  Administered 2020-04-14 – 2020-04-24 (×11): 1 via TRANSDERMAL
  Filled 2020-04-14 (×11): qty 1

## 2020-04-14 MED ORDER — HYDROMORPHONE HCL 1 MG/ML IJ SOLN
0.5000 mg | INTRAMUSCULAR | Status: DC | PRN
Start: 2020-04-14 — End: 2020-04-15
  Administered 2020-04-14 – 2020-04-15 (×8): 1 mg via INTRAVENOUS
  Filled 2020-04-14 (×9): qty 1

## 2020-04-14 NOTE — Progress Notes (Signed)
Patient ID: Marissa Smith, female   DOB: [birth year], 35 y.o.   MRN: [id number] I have discussed patient's case with Dr. Jena Gauss and anticipate he will follow-up with surgery for the left hip and left elbow tomorrow Monday.  Anticipate nonoperative treatment for the right calcaneus fracture.

## 2020-04-14 NOTE — ED Notes (Signed)
MD paged regarding pt's request for additional PRN pain medication

## 2020-04-14 NOTE — ED Notes (Signed)
MD paged again regarding pt's request for pain medication
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
## 2020-04-14 NOTE — ED Notes (Signed)
MD Freida Busman returned page, advised of pt's request for additional pain medication. MD to place order

## 2020-04-14 NOTE — Sedation Documentation (Signed)
MD Lajoyce Corners & Ortho Tech reducing L leg/hip to place in traction
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
## 2020-04-14 NOTE — Progress Notes (Signed)
Orthopedic Tech Progress Note Patient Details:  Marissa Smith 05-16-1985 [id number]  Musculoskeletal Traction Type of Traction: Other (Comment) Traction Location: i assisted dr duda with skeletal traction application. Traction Weight: 20 lbs   Post Interventions Patient Tolerated: Well Instructions Provided: Care of device,Adjustment of device   [name] 04/14/2020, 1:10 AM
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
## 2020-04-14 NOTE — Sedation Documentation (Signed)
Verbal consent of pt & family member obtained & verified by MD Ivor Reining RN, [place] RN & Ortho Tech at bedside

## 2020-04-14 NOTE — ED Notes (Signed)
MD Particia Jasper external urinary catheter in place of internal urinary catheter

## 2020-04-14 NOTE — Progress Notes (Signed)
Orthopedic Tech Progress Note Patient Details:  Marissa Smith 10-15-1985 [id number]  Ortho Devices Type of Ortho Device: Post (short leg) splint,Sugartong splint Ortho Device/Splint Location: lue sugartong, rle posterior short leg splint.applied with dr Burna Sis assist. Ortho Device/Splint Interventions: Ordered,Application,Adjustment   Post Interventions Patient Tolerated: Well Instructions Provided: Care of device,Adjustment of device   [name] 04/14/2020, 1:08 AM

## 2020-04-14 NOTE — ED Notes (Signed)
Pt with little urine output. Pt encouraged to use the purwik. Pt states it is painful when she tries to urinate. Bladder scan reveals 550 in bladder.

## 2020-04-15 ENCOUNTER — Inpatient Hospital Stay (HOSPITAL_COMMUNITY): Payer: Medicaid Other | Admitting: Anesthesiology

## 2020-04-15 ENCOUNTER — Encounter (HOSPITAL_COMMUNITY): Admission: EM | Disposition: A | Discharge status: Rehabilitation Facility | Payer: Self-pay | Source: Home / Self Care

## 2020-04-15 ENCOUNTER — Encounter (HOSPITAL_COMMUNITY): Payer: Self-pay

## 2020-04-15 ENCOUNTER — Inpatient Hospital Stay (HOSPITAL_COMMUNITY): Payer: Medicaid Other

## 2020-04-15 DIAGNOSIS — G5702 Lesion of sciatic nerve, left lower limb: Secondary | ICD-10-CM

## 2020-04-15 HISTORY — DX: Lesion of sciatic nerve, left lower limb: G57.02

## 2020-04-15 HISTORY — PX: ORIF ELBOW FRACTURE: SHX5031

## 2020-04-15 HISTORY — PX: CHEST TUBE INSERTION: SHX231

## 2020-04-15 HISTORY — PX: OPEN REDUCTION INTERNAL FIXATION ACETABULUM FRACTURE POSTERIOR: SHX6833

## 2020-04-15 LAB — VITAMIN D 25 HYDROXY (VIT D DEFICIENCY, FRACTURES): Vit D, 25-Hydroxy: 17.02 ng/mL — ABNORMAL LOW (ref 30–100)

## 2020-04-15 LAB — BASIC METABOLIC PANEL
Anion gap: 10 (ref 5–15)
BUN: 16 mg/dL (ref 6–20)
CO2: 24 mmol/L (ref 22–32)
Calcium: 8.2 mg/dL — ABNORMAL LOW (ref 8.9–10.3)
Chloride: 103 mmol/L (ref 98–111)
Creatinine, Ser: 0.71 mg/dL (ref 0.44–1.00)
GFR, Estimated: >60 mL/min (ref >60)
Glucose, Bld: 109 mg/dL — ABNORMAL HIGH (ref 70–99)
Potassium: 4.1 mmol/L (ref 3.5–5.1)
Sodium: 137 mmol/L (ref 135–145)

## 2020-04-15 LAB — CBC
HCT: 30.4 % — ABNORMAL LOW (ref 36.0–46.0)
Hemoglobin: 9.7 g/dL — ABNORMAL LOW (ref 12.0–15.0)
MCH: 31.4 pg (ref 26.0–34.0)
MCHC: 31.9 g/dL (ref 30.0–36.0)
MCV: 98.4 fL (ref 80.0–100.0)
Platelets: 126 10*3/uL — ABNORMAL LOW (ref 150–400)
RBC: 3.09 MIL/uL — ABNORMAL LOW (ref 3.87–5.11)
RDW: 13.2 % (ref 11.5–15.5)
WBC: 7.3 10*3/uL (ref 4.0–10.5)
nRBC: 0 % (ref 0.0–0.2)

## 2020-04-15 LAB — PREPARE RBC (CROSSMATCH)

## 2020-04-15 SURGERY — OPEN REDUCTION INTERNAL FIXATION ACETABULUM FRACTURE POSTERIOR
Anesthesia: General | Site: Elbow | Laterality: Right

## 2020-04-15 MED ORDER — LIDOCAINE 2% (20 MG/ML) 5 ML SYRINGE
INTRAMUSCULAR | Status: AC
  Filled 2020-04-15: qty 5

## 2020-04-15 MED ORDER — LACTATED RINGERS IV SOLN: INTRAVENOUS | Status: DC | PRN

## 2020-04-15 MED ORDER — ROCURONIUM BROMIDE 10 MG/ML (PF) SYRINGE
PREFILLED_SYRINGE | INTRAVENOUS | Status: AC
  Filled 2020-04-15: qty 10

## 2020-04-15 MED ORDER — KETOROLAC TROMETHAMINE 30 MG/ML IJ SOLN
30.0000 mg | Freq: Four times a day (QID) | INTRAMUSCULAR | Status: DC | PRN
  Administered 2020-04-16 – 2020-04-17 (×2): 30 mg via INTRAVENOUS
  Filled 2020-04-15 (×3): qty 1

## 2020-04-15 MED ORDER — ACETAMINOPHEN 500 MG PO TABS: 1000.0000 mg | ORAL_TABLET | Freq: Once | ORAL | Status: AC

## 2020-04-15 MED ORDER — ROCURONIUM BROMIDE 10 MG/ML (PF) SYRINGE
PREFILLED_SYRINGE | INTRAVENOUS | Status: DC | PRN
  Administered 2020-04-15: 70 mg via INTRAVENOUS
  Administered 2020-04-15: 50 mg via INTRAVENOUS
  Administered 2020-04-15: 30 mg via INTRAVENOUS
  Administered 2020-04-15: 50 mg via INTRAVENOUS
  Administered 2020-04-15: 30 mg via INTRAVENOUS
  Administered 2020-04-15: 50 mg via INTRAVENOUS

## 2020-04-15 MED ORDER — ACETAMINOPHEN 10 MG/ML IV SOLN
INTRAVENOUS | Status: AC
  Filled 2020-04-15: qty 100

## 2020-04-15 MED ORDER — CHLORHEXIDINE GLUCONATE 0.12 % MT SOLN
OROMUCOSAL | Status: AC
  Administered 2020-04-15: 15 mL via OROMUCOSAL
  Filled 2020-04-15: qty 15

## 2020-04-15 MED ORDER — TOBRAMYCIN SULFATE 1.2 G IJ SOLR
INTRAMUSCULAR | Status: AC
  Filled 2020-04-15: qty 1.2

## 2020-04-15 MED ORDER — PROPOFOL 10 MG/ML IV BOLUS
INTRAVENOUS | Status: DC | PRN
Start: 2020-04-15 — End: 2020-04-15
  Administered 2020-04-15: 160 mg via INTRAVENOUS

## 2020-04-15 MED ORDER — VANCOMYCIN HCL 1000 MG IV SOLR
INTRAVENOUS | Status: AC
  Filled 2020-04-15: qty 1000

## 2020-04-15 MED ORDER — CEFAZOLIN SODIUM-DEXTROSE 2-3 GM-%(50ML) IV SOLR
INTRAVENOUS | Status: DC | PRN
  Administered 2020-04-15 (×2): 2 g via INTRAVENOUS

## 2020-04-15 MED ORDER — ONDANSETRON HCL 4 MG/2ML IJ SOLN
INTRAMUSCULAR | Status: AC
  Filled 2020-04-15: qty 2

## 2020-04-15 MED ORDER — FENTANYL CITRATE (PF) 250 MCG/5ML IJ SOLN
INTRAMUSCULAR | Status: AC
  Filled 2020-04-15: qty 5

## 2020-04-15 MED ORDER — HYDROMORPHONE HCL 1 MG/ML IJ SOLN
0.2500 mg | INTRAMUSCULAR | Status: DC | PRN
  Administered 2020-04-15 (×4): 0.5 mg via INTRAVENOUS

## 2020-04-15 MED ORDER — OXYCODONE HCL 5 MG/5ML PO SOLN: 5.0000 mg | Freq: Once | ORAL | Status: DC | PRN

## 2020-04-15 MED ORDER — ACETAMINOPHEN 10 MG/ML IV SOLN
1000.0000 mg | Freq: Once | INTRAVENOUS | Status: AC
  Administered 2020-04-15: 1000 mg via INTRAVENOUS

## 2020-04-15 MED ORDER — CEFAZOLIN SODIUM-DEXTROSE 2-4 GM/100ML-% IV SOLN
2.0000 g | Freq: Three times a day (TID) | INTRAVENOUS | Status: AC
  Administered 2020-04-15 – 2020-04-16 (×3): 2 g via INTRAVENOUS
  Filled 2020-04-15 (×3): qty 100

## 2020-04-15 MED ORDER — ENOXAPARIN SODIUM 30 MG/0.3ML ~~LOC~~ SOLN
30.0000 mg | Freq: Two times a day (BID) | SUBCUTANEOUS | Status: DC
  Administered 2020-04-16 – 2020-04-24 (×17): 30 mg via SUBCUTANEOUS
  Filled 2020-04-15 (×17): qty 0.3

## 2020-04-15 MED ORDER — TRANEXAMIC ACID-NACL 1000-0.7 MG/100ML-% IV SOLN
INTRAVENOUS | Status: DC | PRN
  Administered 2020-04-15: 1000 mg via INTRAVENOUS

## 2020-04-15 MED ORDER — PROPOFOL 10 MG/ML IV BOLUS
INTRAVENOUS | Status: AC
  Filled 2020-04-15: qty 20

## 2020-04-15 MED ORDER — OXYCODONE HCL 5 MG PO TABS: 5.0000 mg | ORAL_TABLET | Freq: Once | ORAL | Status: DC | PRN

## 2020-04-15 MED ORDER — OXYCODONE HCL 5 MG PO TABS
10.0000 mg | ORAL_TABLET | ORAL | Status: DC | PRN
  Administered 2020-04-15: 10 mg via ORAL
  Administered 2020-04-16: 15 mg via ORAL
  Filled 2020-04-15: qty 2
  Filled 2020-04-15: qty 3

## 2020-04-15 MED ORDER — LIDOCAINE 2% (20 MG/ML) 5 ML SYRINGE
INTRAMUSCULAR | Status: DC | PRN
  Administered 2020-04-15: 100 mg via INTRAVENOUS

## 2020-04-15 MED ORDER — SCOPOLAMINE 1 MG/3DAYS TD PT72: 1.0000 | MEDICATED_PATCH | Freq: Once | TRANSDERMAL | Status: DC

## 2020-04-15 MED ORDER — 0.9 % SODIUM CHLORIDE (POUR BTL) OPTIME
TOPICAL | Status: DC | PRN
  Administered 2020-04-15: 1000 mL

## 2020-04-15 MED ORDER — KETOROLAC TROMETHAMINE 30 MG/ML IJ SOLN
INTRAMUSCULAR | Status: AC
  Filled 2020-04-15: qty 1

## 2020-04-15 MED ORDER — SODIUM CHLORIDE 0.9% IV SOLUTION: Freq: Once | INTRAVENOUS | Status: DC

## 2020-04-15 MED ORDER — VANCOMYCIN HCL 1 G IV SOLR
INTRAVENOUS | Status: DC | PRN
  Administered 2020-04-15 (×2): 1000 mg via TOPICAL

## 2020-04-15 MED ORDER — EPHEDRINE SULFATE-NACL 50-0.9 MG/10ML-% IV SOSY
PREFILLED_SYRINGE | INTRAVENOUS | Status: DC | PRN
  Administered 2020-04-15 (×2): 5 mg via INTRAVENOUS

## 2020-04-15 MED ORDER — PHENYLEPHRINE 40 MCG/ML (10ML) SYRINGE FOR IV PUSH (FOR BLOOD PRESSURE SUPPORT)
PREFILLED_SYRINGE | INTRAVENOUS | Status: DC | PRN
  Administered 2020-04-15 (×2): 80 ug via INTRAVENOUS
  Administered 2020-04-15 (×2): 120 ug via INTRAVENOUS

## 2020-04-15 MED ORDER — HYDROMORPHONE HCL 1 MG/ML IJ SOLN
0.5000 mg | INTRAMUSCULAR | Status: DC | PRN
  Administered 2020-04-15 (×2): 1 mg via INTRAVENOUS
  Filled 2020-04-15 (×2): qty 1

## 2020-04-15 MED ORDER — DEXAMETHASONE SODIUM PHOSPHATE 10 MG/ML IJ SOLN
INTRAMUSCULAR | Status: AC
  Filled 2020-04-15: qty 1

## 2020-04-15 MED ORDER — MIDAZOLAM HCL 5 MG/5ML IJ SOLN
INTRAMUSCULAR | Status: DC | PRN
  Administered 2020-04-15: 2 mg via INTRAVENOUS

## 2020-04-15 MED ORDER — PHENYLEPHRINE 40 MCG/ML (10ML) SYRINGE FOR IV PUSH (FOR BLOOD PRESSURE SUPPORT)
PREFILLED_SYRINGE | INTRAVENOUS | Status: AC
  Filled 2020-04-15: qty 10

## 2020-04-15 MED ORDER — ACETAMINOPHEN 500 MG PO TABS
ORAL_TABLET | ORAL | Status: AC
  Administered 2020-04-15: 1000 mg via ORAL
  Filled 2020-04-15: qty 2

## 2020-04-15 MED ORDER — EPHEDRINE 5 MG/ML INJ
INTRAVENOUS | Status: AC
  Filled 2020-04-15: qty 10

## 2020-04-15 MED ORDER — HYDROMORPHONE HCL 1 MG/ML IJ SOLN
INTRAMUSCULAR | Status: AC
  Filled 2020-04-15: qty 1

## 2020-04-15 MED ORDER — PROMETHAZINE HCL 25 MG/ML IJ SOLN: 6.2500 mg | INTRAMUSCULAR | Status: DC | PRN

## 2020-04-15 MED ORDER — FENTANYL CITRATE (PF) 100 MCG/2ML IJ SOLN
INTRAMUSCULAR | Status: DC | PRN
  Administered 2020-04-15 (×7): 50 ug via INTRAVENOUS

## 2020-04-15 MED ORDER — LACTATED RINGERS IV SOLN: INTRAVENOUS | Status: DC

## 2020-04-15 MED ORDER — KETOROLAC TROMETHAMINE 30 MG/ML IJ SOLN
30.0000 mg | Freq: Once | INTRAMUSCULAR | Status: AC
  Administered 2020-04-15: 30 mg via INTRAVENOUS

## 2020-04-15 MED ORDER — TOBRAMYCIN SULFATE 1.2 G IJ SOLR
INTRAMUSCULAR | Status: DC | PRN
  Administered 2020-04-15: 1.2 g via TOPICAL

## 2020-04-15 MED ORDER — CHLORHEXIDINE GLUCONATE 0.12% ORAL RINSE (MEDLINE KIT): 15.0000 mL | Freq: Once | OROMUCOSAL | Status: AC

## 2020-04-15 MED ORDER — SUGAMMADEX SODIUM 200 MG/2ML IV SOLN
INTRAVENOUS | Status: DC | PRN
  Administered 2020-04-15: 200 mg via INTRAVENOUS

## 2020-04-15 MED ORDER — MIDAZOLAM HCL 2 MG/2ML IJ SOLN
INTRAMUSCULAR | Status: AC
  Filled 2020-04-15: qty 2

## 2020-04-15 MED ORDER — OXYCODONE HCL 5 MG PO TABS: 5.0000 mg | ORAL_TABLET | ORAL | Status: DC | PRN

## 2020-04-15 MED ORDER — DEXAMETHASONE SODIUM PHOSPHATE 10 MG/ML IJ SOLN
INTRAMUSCULAR | Status: DC | PRN
  Administered 2020-04-15: 10 mg via INTRAVENOUS

## 2020-04-15 MED ORDER — DIAZEPAM 5 MG PO TABS
5.0000 mg | ORAL_TABLET | Freq: Four times a day (QID) | ORAL | Status: DC | PRN
  Administered 2020-04-15 – 2020-04-24 (×11): 5 mg via ORAL
  Filled 2020-04-15 (×13): qty 1

## 2020-04-15 MED ORDER — SCOPOLAMINE 1 MG/3DAYS TD PT72
MEDICATED_PATCH | TRANSDERMAL | Status: AC
  Administered 2020-04-15: 1.5 mg via TRANSDERMAL
  Filled 2020-04-15: qty 1

## 2020-04-15 MED ORDER — ONDANSETRON HCL 4 MG/2ML IJ SOLN
INTRAMUSCULAR | Status: DC | PRN
  Administered 2020-04-15: 4 mg via INTRAVENOUS

## 2020-04-15 SURGICAL SUPPLY — 132 items
ADH SKN CLS LQ APL DERMABOND (GAUZE/BANDAGES/DRESSINGS) ×3
APL PRP STRL LF DISP 70% ISPRP (MISCELLANEOUS) ×6
APL SKNCLS STERI-STRIP NONHPOA (GAUZE/BANDAGES/DRESSINGS) ×3
BENZOIN TINCTURE PRP APPL 2/3 (GAUZE/BANDAGES/DRESSINGS) ×1 IMPLANT
BIT DRILL 2.5X300 (BIT) IMPLANT
BIT DRILL QC 2.5X135 (BIT) ×1 IMPLANT
BIT DRILL QC 2X140 (BIT) ×1 IMPLANT
BLADE AVERAGE 25X9 (BLADE) IMPLANT
BLADE CLIPPER SURG (BLADE) ×4 IMPLANT
BLADE SURG 10 STRL SS (BLADE) IMPLANT
BLADE SURG 11 STRL SS (BLADE) ×4 IMPLANT
BNDG CMPR 9X4 STRL LF SNTH (GAUZE/BANDAGES/DRESSINGS)
BNDG CMPR MED 10X6 ELC LF (GAUZE/BANDAGES/DRESSINGS) ×3
BNDG COHESIVE 4X5 TAN STRL (GAUZE/BANDAGES/DRESSINGS) ×4 IMPLANT
BNDG ELASTIC 3X5.8 VLCR STR LF (GAUZE/BANDAGES/DRESSINGS) ×4 IMPLANT
BNDG ELASTIC 4X5.8 VLCR STR LF (GAUZE/BANDAGES/DRESSINGS) ×4 IMPLANT
BNDG ELASTIC 6X10 VLCR STRL LF (GAUZE/BANDAGES/DRESSINGS) ×1 IMPLANT
BNDG ELASTIC 6X5.8 VLCR STR LF (GAUZE/BANDAGES/DRESSINGS) ×4 IMPLANT
BNDG ESMARK 4X9 LF (GAUZE/BANDAGES/DRESSINGS) IMPLANT
BNDG GAUZE ELAST 4 BULKY (GAUZE/BANDAGES/DRESSINGS) ×4 IMPLANT
BRUSH SCRUB EZ PLAIN DRY (MISCELLANEOUS) ×8 IMPLANT
CHLORAPREP W/TINT 26 (MISCELLANEOUS) ×8 IMPLANT
CLEANER TIP ELECTROSURG 2X2 (MISCELLANEOUS) ×4 IMPLANT
CLSR STERI-STRIP ANTIMIC 1/2X4 (GAUZE/BANDAGES/DRESSINGS) ×1 IMPLANT
COVER SURGICAL LIGHT HANDLE (MISCELLANEOUS) ×8 IMPLANT
COVER WAND RF STERILE (DRAPES) ×4 IMPLANT
CUFF TOURN SGL QUICK 18X4 (TOURNIQUET CUFF) IMPLANT
CUFF TOURN SGL QUICK 24 (TOURNIQUET CUFF)
CUFF TRNQT CYL 24X4X16.5-23 (TOURNIQUET CUFF) IMPLANT
DECANTER SPIKE VIAL GLASS SM (MISCELLANEOUS) IMPLANT
DEFOGGER ANTIFOG KIT (MISCELLANEOUS) ×4 IMPLANT
DERMABOND ADHESIVE PROPEN (GAUZE/BANDAGES/DRESSINGS) ×1
DERMABOND ADVANCED .7 DNX6 (GAUZE/BANDAGES/DRESSINGS) IMPLANT
DRAPE C-ARM 42X72 X-RAY (DRAPES) ×4 IMPLANT
DRAPE C-ARMOR (DRAPES) ×4 IMPLANT
DRAPE EXTREMITY T 121X128X90 (DISPOSABLE) ×1 IMPLANT
DRAPE INCISE IOBAN 66X45 STRL (DRAPES) IMPLANT
DRAPE INCISE IOBAN 85X60 (DRAPES) ×4 IMPLANT
DRAPE ORTHO SPLIT 77X108 STRL (DRAPES) ×8
DRAPE SURG 17X23 STRL (DRAPES) ×24 IMPLANT
DRAPE SURG ORHT 6 SPLT 77X108 (DRAPES) ×6 IMPLANT
DRAPE U-SHAPE 47X51 STRL (DRAPES) ×4 IMPLANT
DRILL BIT 2.5X300 (BIT) ×4
DRSG ADAPTIC 3X8 NADH LF (GAUZE/BANDAGES/DRESSINGS) IMPLANT
DRSG MEPILEX BORDER 4X12 (GAUZE/BANDAGES/DRESSINGS) ×1 IMPLANT
DRSG MEPILEX BORDER 4X8 (GAUZE/BANDAGES/DRESSINGS) IMPLANT
ELECT BLADE 4.0 EZ CLEAN MEGAD (MISCELLANEOUS) ×4
ELECT BLADE 6.5 EXT (BLADE) IMPLANT
ELECT REM PT RETURN 9FT ADLT (ELECTROSURGICAL) ×8
ELECTRODE BLDE 4.0 EZ CLN MEGD (MISCELLANEOUS) ×3 IMPLANT
ELECTRODE REM PT RTRN 9FT ADLT (ELECTROSURGICAL) ×3 IMPLANT
GAUZE SPONGE 4X4 12PLY STRL (GAUZE/BANDAGES/DRESSINGS) ×4 IMPLANT
GAUZE XEROFORM 5X9 LF (GAUZE/BANDAGES/DRESSINGS) ×4 IMPLANT
GLOVE BIO SURGEON STRL SZ 6.5 (GLOVE) ×14 IMPLANT
GLOVE BIO SURGEON STRL SZ7.5 (GLOVE) ×17 IMPLANT
GLOVE BIOGEL PI IND STRL 6.5 (GLOVE) ×3 IMPLANT
GLOVE BIOGEL PI IND STRL 7.5 (GLOVE) ×3 IMPLANT
GLOVE BIOGEL PI INDICATOR 6.5 (GLOVE) ×1
GLOVE BIOGEL PI INDICATOR 7.5 (GLOVE) ×1
GOWN STRL REUS W/ TWL LRG LVL3 (GOWN DISPOSABLE) ×6 IMPLANT
GOWN STRL REUS W/TWL LRG LVL3 (GOWN DISPOSABLE) ×16
HANDPIECE INTERPULSE COAX TIP (DISPOSABLE) ×4
K-WIRE .062 (WIRE) ×8
K-WIRE FX7.25X.062XNS KRSH (WIRE) ×6
KIT BASIN OR (CUSTOM PROCEDURE TRAY) ×4 IMPLANT
KIT TURNOVER KIT B (KITS) ×4 IMPLANT
KWIRE FX7.25X.062XNS KRSH (WIRE) IMPLANT
MANIFOLD NEPTUNE II (INSTRUMENTS) ×4 IMPLANT
NS IRRIG 1000ML POUR BTL (IV SOLUTION) ×4 IMPLANT
PACK ORTHO EXTREMITY (CUSTOM PROCEDURE TRAY) ×4 IMPLANT
PACK TOTAL JOINT (CUSTOM PROCEDURE TRAY) ×4 IMPLANT
PACK UNIVERSAL I (CUSTOM PROCEDURE TRAY) ×4 IMPLANT
PAD ABD 8X10 STRL (GAUZE/BANDAGES/DRESSINGS) ×1 IMPLANT
PAD ARMBOARD 7.5X6 YLW CONV (MISCELLANEOUS) ×8 IMPLANT
PAD CAST 4YDX4 CTTN HI CHSV (CAST SUPPLIES) ×3 IMPLANT
PADDING CAST COTTON 4X4 STRL (CAST SUPPLIES) ×4
PADDING CAST COTTON 6X4 STRL (CAST SUPPLIES) ×1 IMPLANT
PLATE 4HOLE LEFT (Plate) ×1 IMPLANT
PLATE BONE 91MM 7HOLE PELVIC (Plate) ×1 IMPLANT
PLATE BONE LOCK 65MM 5 HOLE (Plate) ×1 IMPLANT
PLATE LCP 6H 2.7 (Plate) ×1 IMPLANT
RETRIEVER SUT HEWSON (MISCELLANEOUS) ×1 IMPLANT
SCREW CORTEX 2.7X14 (Screw) ×4 IMPLANT
SCREW CORTEX 3.5 18MM (Screw) ×3 IMPLANT
SCREW CORTEX 3.5 20MM (Screw) ×1 IMPLANT
SCREW CORTEX 3.5X40MM (Screw) ×1 IMPLANT
SCREW CORTEX 3.5X45MM (Screw) ×1 IMPLANT
SCREW CORTEX 3.5X50MM (Screw) ×3 IMPLANT
SCREW LOCK CORT ST 3.5X18 (Screw) IMPLANT
SCREW LOCK CORT ST 3.5X20 (Screw) IMPLANT
SCREW LOCK ST 2.7X16 (Screw) ×1 IMPLANT
SCREW LOCK T8 22X2.7XST VA (Screw) IMPLANT
SCREW LOCK T8 24X2.7XSTVA (Screw) IMPLANT
SCREW LOCK VA ST 2.7X10 (Screw) ×1 IMPLANT
SCREW LOCK VA ST 2.7X18 (Screw) ×1 IMPLANT
SCREW LOCKING 2.7X22MM (Screw) ×4 IMPLANT
SCREW LOCKING 2.7X24MM (Screw) ×4 IMPLANT
SCREW LOCKING VA 2.7X30MM (Screw) ×1 IMPLANT
SCREW LOCKING VA 2.7X42 (Screw) ×1 IMPLANT
SCREW METAPHYSCAL 18MM (Screw) ×1 IMPLANT
SCREW METAPHYSEAL 2.7X28 (Screw) ×1 IMPLANT
SCREW NON LOCK 2.7X16MM (Screw) ×1 IMPLANT
SCREW PELVIC CORT ST 3.5X50 (Screw) IMPLANT
SCREW PELVIC CORT ST 3.5X60 (Screw) IMPLANT
SCREW PELVIC CORT ST 3.5X70 (Screw) IMPLANT
SCREW SELF TAP 3.5 60M (Screw) ×2 IMPLANT
SCREW SELF TAP 3.5 70M (Screw) ×1 IMPLANT
SET HNDPC FAN SPRY TIP SCT (DISPOSABLE) ×3 IMPLANT
SLING ARM FOAM STRAP LRG (SOFTGOODS) ×1 IMPLANT
SPONGE LAP 18X18 RF (DISPOSABLE) ×8 IMPLANT
STAPLER VISISTAT 35W (STAPLE) ×4 IMPLANT
STRIP CLOSURE SKIN 1/2X4 (GAUZE/BANDAGES/DRESSINGS) IMPLANT
SUCTION FRAZIER HANDLE 10FR (MISCELLANEOUS) ×4
SUCTION TUBE FRAZIER 10FR DISP (MISCELLANEOUS) ×3 IMPLANT
SUT FIBERWIRE #2 38 T-5 BLUE (SUTURE) ×8
SUT MNCRL AB 3-0 PS2 18 (SUTURE) ×7 IMPLANT
SUT PDS AB 2-0 CT1 27 (SUTURE) IMPLANT
SUT VIC AB 1 CT1 27 (SUTURE) ×12
SUT VIC AB 1 CT1 27XBRD ANBCTR (SUTURE) ×3 IMPLANT
SUT VIC AB 2-0 CT1 27 (SUTURE) ×4
SUT VIC AB 2-0 CT1 TAPERPNT 27 (SUTURE) IMPLANT
SUT VIC AB 2-0 CT2 27 (SUTURE) ×1 IMPLANT
SUTURE FIBERWR #2 38 T-5 BLUE (SUTURE) IMPLANT
SYR CONTROL 10ML LL (SYRINGE) ×4 IMPLANT
TOWEL GREEN STERILE (TOWEL DISPOSABLE) ×8 IMPLANT
TOWEL GREEN STERILE FF (TOWEL DISPOSABLE) ×8 IMPLANT
TRAY FOLEY MTR SLVR 16FR STAT (SET/KITS/TRAYS/PACK) IMPLANT
TRAY WAYNE PNEUMOTHORAX 14X18 (TRAY / TRAY PROCEDURE) ×1 IMPLANT
TUBE CONNECTING 12X1/4 (SUCTIONS) ×5 IMPLANT
UNDERPAD 30X36 HEAVY ABSORB (UNDERPADS AND DIAPERS) ×4 IMPLANT
WATER STERILE IRR 1000ML POUR (IV SOLUTION) ×4 IMPLANT
YANKAUER SUCT BULB TIP NO VENT (SUCTIONS) ×5 IMPLANT
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
## 2020-04-15 NOTE — Interval H&P Note (Signed)
History and Physical Interval Note:  04/15/2020 10:21 AM  Marissa Smith  has presented today for surgery, with the diagnosis of Left acetabular fracture/left ulna fracture.  The various methods of treatment have been discussed with the patient and family. After consideration of risks, benefits and other options for treatment, the patient has consented to  Procedure(s): OPEN REDUCTION INTERNAL FIXATION ACETABULUM FRACTURE POSTERIOR (Left) OPEN REDUCTION INTERNAL FIXATION (ORIF) ELBOW/OLECRANON FRACTURE (Left) as a surgical intervention.  The patient's history has been reviewed, patient examined, no change in status, stable for surgery.  I have reviewed the patient's chart and labs.  Questions were answered to the patient's satisfaction.     Caryn Bee P Amorah [place]

## 2020-04-15 NOTE — Anesthesia Postprocedure Evaluation (Signed)
Anesthesia Post Note  Patient: Marissa Smith  Procedure(s) Performed: OPEN REDUCTION INTERNAL FIXATION ACETABULUM FRACTURE POSTERIOR (Left ) OPEN REDUCTION INTERNAL FIXATION (ORIF) ELBOW/OLECRANON FRACTURE (Left Elbow) CHEST TUBE INSERTION (Right Chest)     Patient location during evaluation: PACU Anesthesia Type: General Level of consciousness: awake Pain management: pain level controlled Vital Signs Assessment: post-procedure vital signs reviewed and stable Respiratory status: spontaneous breathing, nonlabored ventilation, respiratory function stable and patient connected to nasal cannula oxygen Cardiovascular status: blood pressure returned to baseline and stable Postop Assessment: no apparent nausea or vomiting Anesthetic complications: no   No complications documented.  Last Vitals:  Vitals:   04/15/20 1815 04/15/20 1822  BP: 123/83 123/83  Pulse: 80 78  Resp: 15 16  Temp:  36.7 C  SpO2: 94% 97%    Last Pain:  Vitals:   04/15/20 1815  TempSrc:   PainSc: 7                  Shon Indelicato P Gyanna Jarema

## 2020-04-15 NOTE — Transfer of Care (Signed)
Immediate Anesthesia Transfer of Care Note  Patient: Manuella Blackson Davidoff  Procedure(s) Performed: OPEN REDUCTION INTERNAL FIXATION ACETABULUM FRACTURE POSTERIOR (Left ) OPEN REDUCTION INTERNAL FIXATION (ORIF) ELBOW/OLECRANON FRACTURE (Left Elbow) CHEST TUBE INSERTION (Right Chest)  Patient Location: PACU  Anesthesia Type:General  Level of Consciousness: awake, alert  and oriented  Airway & Oxygen Therapy: Patient Spontanous Breathing and Patient connected to face mask oxygen  Post-op Assessment: Report given to RN and Post -op Vital signs reviewed and stable  Post vital signs: Reviewed and stable  Last Vitals:  Vitals Value Taken Time  BP    Temp    Pulse 106 04/15/20 1624  Resp 33 04/15/20 1624  SpO2 96 % 04/15/20 1624  Vitals shown include unvalidated device data.  Last Pain:  Vitals:   04/15/20 0812  TempSrc: Temporal  PainSc:          Complications: No complications documented.
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
## 2020-04-15 NOTE — Op Note (Signed)
  04/13/2020 - 04/15/2020  11:57 AM  PATIENT:  Marissa Smith  34 y.o. female  PRE-OPERATIVE DIAGNOSIS:  R pneumothorax  POST-OPERATIVE DIAGNOSIS:  R pneumothorax  PROCEDURE:  Procedure(s): R chest tube placement 23fr  SURGEON:  Violeta Gelinas, MD  ASSISTANTS: Leary Roca, PA-C  ANESTHESIA:   general  EBL:  No intake/output data recorded.  BLOOD ADMINISTERED:none  DRAINS: chest tube   SPECIMEN:  No Specimen  DISPOSITION OF SPECIMEN:  N/A  COUNTS:  YES  DICTATION: .Dragon Dictation Informed consent was obtained.  She received intravenous antibiotics.  Her site was marked.  She was brought to the operating room and general endotracheal anesthesia was administered by the anesthesia staff.  Her right chest was prepped and draped in sterile fashion.  We did a timeout procedure.  Angiocath was inserted along the anterior axillary line cephalad to the nipple.  A guidewire was passed followed by a dilator.  Next, a 14 French chest tube was placed.  This was hooked up to a Pleur-evac.  It was sutured in place and a sterile dressing was applied.  She tolerated this well.  She remained in the operating room with Dr. Jena Gauss at the completion of the procedure.  Our counts were correct. PATIENT DISPOSITION:  remains in OR with Dr. Jena Gauss   Delay start of Pharmacological VTE agent (>24hrs) due to surgical blood loss or risk of bleeding:  no  Violeta Gelinas, MD, MPH, FACS Pager: [phone]  1/3/202211:57 AM
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
## 2020-04-15 NOTE — Anesthesia Procedure Notes (Signed)
Procedure Name: Intubation Date/Time: 04/15/2020 11:31 AM Performed by: Candis Shine, CRNA Pre-anesthesia Checklist: Patient identified, Emergency Drugs available, Suction available and Patient being monitored Patient Re-evaluated:Patient Re-evaluated prior to induction Oxygen Delivery Method: Circle System Utilized Preoxygenation: Pre-oxygenation with 100% oxygen Induction Type: IV induction Ventilation: Mask ventilation without difficulty Laryngoscope Size: Mac and 3 Grade View: Grade I Tube type: Oral Tube size: 7.5 mm Number of attempts: 1 Airway Equipment and Method: Stylet Placement Confirmation: ETT inserted through vocal cords under direct vision,  positive ETCO2 and breath sounds checked- equal and bilateral Secured at: 22 cm Tube secured with: Tape Dental Injury: Teeth and Oropharynx as per pre-operative assessment

## 2020-04-15 NOTE — Anesthesia Procedure Notes (Signed)
Arterial Line Insertion Start/End1/06/2020 11:53 AM, 04/15/2020 11:56 AM Performed by: Kaylyn Layer, MD, anesthesiologist  Patient location: Pre-op. Preanesthetic checklist: patient identified, IV checked, site marked, risks and benefits discussed, surgical consent, monitors and equipment checked, pre-op evaluation, timeout performed and anesthesia consent Patient sedated Right, radial was placed Catheter size: 20 Fr Hand hygiene performed  and maximum sterile barriers used   Attempts: 2 Procedure performed using ultrasound guided technique. Ultrasound Notes:anatomy identified, needle tip was noted to be adjacent to the nerve/plexus identified and no ultrasound evidence of intravascular and/or intraneural injection Following insertion, dressing applied and Biopatch. Post procedure assessment: normal and unchanged  Patient tolerated the procedure well with no immediate complications.

## 2020-04-15 NOTE — Progress Notes (Signed)
Orthopedic Tech Progress Note Patient Details:  Marissa Smith [birth year] [id number] OR RN called requesting a CAM WALKER BOOT. Dropped off to OR DESK Ortho Devices Type of Ortho Device: CAM walker Ortho Device/Splint Location: lue sugartong, rle posterior short leg splint.applied with dr Burna Sis assist. Ortho Device/Splint Interventions: Other (comment)   Post Interventions Patient Tolerated: Other (comment) Instructions Provided: Other (comment)   Donald Pore 04/15/2020, 10:27 AM
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
## 2020-04-16 ENCOUNTER — Inpatient Hospital Stay (HOSPITAL_COMMUNITY): Payer: Medicaid Other

## 2020-04-16 LAB — BASIC METABOLIC PANEL
Anion gap: 8 (ref 5–15)
BUN: 15 mg/dL (ref 6–20)
CO2: 25 mmol/L (ref 22–32)
Calcium: 7.7 mg/dL — ABNORMAL LOW (ref 8.9–10.3)
Chloride: 103 mmol/L (ref 98–111)
Creatinine, Ser: 0.68 mg/dL (ref 0.44–1.00)
GFR, Estimated: >60 mL/min (ref >60)
Glucose, Bld: 124 mg/dL — ABNORMAL HIGH (ref 70–99)
Potassium: 3.7 mmol/L (ref 3.5–5.1)
Sodium: 136 mmol/L (ref 135–145)

## 2020-04-16 LAB — CBC
HCT: 24.7 % — ABNORMAL LOW (ref 36.0–46.0)
Hemoglobin: 8 g/dL — ABNORMAL LOW (ref 12.0–15.0)
MCH: 31.5 pg (ref 26.0–34.0)
MCHC: 32.4 g/dL (ref 30.0–36.0)
MCV: 97.2 fL (ref 80.0–100.0)
Platelets: 133 10*3/uL — ABNORMAL LOW (ref 150–400)
RBC: 2.54 MIL/uL — ABNORMAL LOW (ref 3.87–5.11)
RDW: 13.1 % (ref 11.5–15.5)
WBC: 7.6 10*3/uL (ref 4.0–10.5)
nRBC: 0 % (ref 0.0–0.2)

## 2020-04-16 MED ORDER — VITAMIN D 25 MCG (1000 UNIT) PO TABS
2000.0000 [IU] | ORAL_TABLET | Freq: Every day | ORAL | Status: DC
  Administered 2020-04-16 – 2020-04-24 (×9): 2000 [IU] via ORAL
  Filled 2020-04-16 (×9): qty 2

## 2020-04-16 MED ORDER — BETHANECHOL CHLORIDE 10 MG PO TABS
10.0000 mg | ORAL_TABLET | Freq: Three times a day (TID) | ORAL | Status: DC
  Administered 2020-04-16 (×3): 10 mg via ORAL
  Filled 2020-04-16 (×3): qty 1

## 2020-04-16 MED ORDER — HYDROMORPHONE HCL 1 MG/ML IJ SOLN
0.5000 mg | INTRAMUSCULAR | Status: DC | PRN
  Administered 2020-04-16 (×2): 2 mg via INTRAVENOUS
  Filled 2020-04-16 (×2): qty 2

## 2020-04-16 MED ORDER — OXYCODONE HCL 5 MG PO TABS
10.0000 mg | ORAL_TABLET | ORAL | Status: DC | PRN
  Administered 2020-04-16 – 2020-04-22 (×26): 15 mg via ORAL
  Administered 2020-04-22 – 2020-04-23 (×3): 10 mg via ORAL
  Administered 2020-04-23 – 2020-04-24 (×4): 15 mg via ORAL
  Administered 2020-04-24: 10 mg via ORAL
  Filled 2020-04-16 (×15): qty 3
  Filled 2020-04-16: qty 2
  Filled 2020-04-16 (×2): qty 3
  Filled 2020-04-16: qty 2
  Filled 2020-04-16 (×6): qty 3
  Filled 2020-04-16: qty 2
  Filled 2020-04-16 (×2): qty 3
  Filled 2020-04-16: qty 2
  Filled 2020-04-16 (×5): qty 3
  Filled 2020-04-16: qty 2
  Filled 2020-04-16: qty 3

## 2020-04-16 MED ORDER — HYDROMORPHONE HCL 1 MG/ML IJ SOLN
0.5000 mg | INTRAMUSCULAR | Status: DC | PRN
  Administered 2020-04-16 (×3): 1 mg via INTRAVENOUS
  Administered 2020-04-16: 0.5 mg via INTRAVENOUS
  Administered 2020-04-16 – 2020-04-17 (×3): 1 mg via INTRAVENOUS
  Filled 2020-04-16 (×7): qty 1

## 2020-04-16 MED ORDER — POLYETHYLENE GLYCOL 3350 17 G PO PACK
17.0000 g | PACK | Freq: Every day | ORAL | Status: DC
  Administered 2020-04-16: 17 g via ORAL
  Filled 2020-04-16: qty 1

## 2020-04-16 MED ORDER — TRAMADOL HCL 50 MG PO TABS
50.0000 mg | ORAL_TABLET | Freq: Four times a day (QID) | ORAL | Status: DC
  Administered 2020-04-16 – 2020-04-17 (×3): 50 mg via ORAL
  Filled 2020-04-16 (×3): qty 1

## 2020-04-16 MED ORDER — CHLORHEXIDINE GLUCONATE CLOTH 2 % EX PADS
6.0000 | MEDICATED_PAD | Freq: Every day | CUTANEOUS | Status: DC
  Administered 2020-04-16 – 2020-04-23 (×6): 6 via TOPICAL
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
## 2020-04-16 NOTE — Progress Notes (Signed)
Dr. paged and clarified orders on chest tube suction to the wall at 20 per verbal order.
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
## 2020-04-16 NOTE — Progress Notes (Signed)
Orthopedic Tech Progress Note Patient Details:  Marissa Smith [birth year] [id number] Removed splint on patient and then applied a CAM WALKER BOOT.  Ortho Devices Type of Ortho Device: CAM walker Ortho Device/Splint Location: RLE Ortho Device/Splint Interventions: Ordered,Application,Adjustment   Post Interventions Patient Tolerated: Well Instructions Provided: Care of device   Donald Pore 04/16/2020, 11:21 AM

## 2020-04-16 NOTE — Plan of Care (Signed)
  Problem: Education: Goal: Knowledge of General Education information will improve Description: Including pain rating scale, medication(s)/side effects and non-pharmacologic comfort measures Outcome: Progressing   Problem: Health Behavior/Discharge Planning: Goal: Ability to manage health-related needs will improve Outcome: Progressing   Problem: Clinical Measurements: Goal: Ability to maintain clinical measurements within normal limits will improve Outcome: Progressing Goal: Will remain free from infection Outcome: Progressing Goal: Diagnostic test results will improve Outcome: Progressing Goal: Respiratory complications will improve Outcome: Progressing Goal: Cardiovascular complication will be avoided Outcome: Progressing   Problem: Activity: Goal: Risk for activity intolerance will decrease Outcome: Progressing   Problem: Nutrition: Goal: Adequate nutrition will be maintained Outcome: Progressing   Problem: Coping: Goal: Level of anxiety will decrease Outcome: Progressing   Problem: Elimination: Goal: Will not experience complications related to bowel motility Outcome: Progressing Goal: Will not experience complications related to urinary retention Outcome: Progressing   Problem: Pain Managment: Goal: General experience of comfort will improve Outcome: Progressing   Problem: Safety: Goal: Ability to remain free from injury will improve Outcome: Progressing   Problem: Skin Integrity: Goal: Risk for impaired skin integrity will decrease Outcome: Progressing   Problem: Activity: Goal: Ability to avoid complications of mobility impairment will improve Outcome: Progressing Goal: Ability to tolerate increased activity will improve Outcome: Progressing   Problem: Education: Goal: Verbalization of understanding the information provided will improve Outcome: Progressing   Problem: Coping: Goal: Level of anxiety will decrease Outcome: Progressing   Problem:  Physical Regulation: Goal: Postoperative complications will be avoided or minimized Outcome: Progressing   Problem: Respiratory: Goal: Ability to maintain a clear airway will improve Outcome: Progressing   Problem: Pain Management: Goal: Pain level will decrease Outcome: Progressing   Problem: Skin Integrity: Goal: Signs of wound healing will improve Outcome: Progressing   Problem: Tissue Perfusion: Goal: Ability to maintain adequate tissue perfusion will improve Outcome: Progressing   
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
## 2020-04-17 ENCOUNTER — Inpatient Hospital Stay (HOSPITAL_COMMUNITY): Payer: Medicaid Other

## 2020-04-17 LAB — POCT I-STAT 7, (LYTES, BLD GAS, ICA,H+H)
Acid-Base Excess: 0 mmol/L (ref 0.0–2.0)
Acid-Base Excess: 0 mmol/L (ref 0.0–2.0)
Acid-base deficit: 1 mmol/L (ref 0.0–2.0)
Bicarbonate: 24.9 mmol/L (ref 20.0–28.0)
Bicarbonate: 25.2 mmol/L (ref 20.0–28.0)
Bicarbonate: 25.5 mmol/L (ref 20.0–28.0)
Calcium, Ion: 1.16 mmol/L (ref 1.15–1.40)
Calcium, Ion: 1.17 mmol/L (ref 1.15–1.40)
Calcium, Ion: 1.15 mmol/L (ref 1.15–1.40)
HCT: 23 % — ABNORMAL LOW (ref 36.0–46.0)
HCT: 23 % — ABNORMAL LOW (ref 36.0–46.0)
HCT: 24 % — ABNORMAL LOW (ref 36.0–46.0)
Hemoglobin: 7.8 g/dL — ABNORMAL LOW (ref 12.0–15.0)
Hemoglobin: 7.8 g/dL — ABNORMAL LOW (ref 12.0–15.0)
Hemoglobin: 8.2 g/dL — ABNORMAL LOW (ref 12.0–15.0)
O2 Saturation: 99 %
O2 Saturation: 99 %
O2 Saturation: 99 %
Patient temperature: 36
Potassium: 3.6 mmol/L (ref 3.5–5.1)
Potassium: 3.8 mmol/L (ref 3.5–5.1)
Potassium: 4.2 mmol/L (ref 3.5–5.1)
Sodium: 138 mmol/L (ref 135–145)
Sodium: 137 mmol/L (ref 135–145)
Sodium: 136 mmol/L (ref 135–145)
TCO2: 26 mmol/L (ref 22–32)
TCO2: 26 mmol/L (ref 22–32)
TCO2: 27 mmol/L (ref 22–32)
pCO2 arterial: 44.1 mmHg (ref 32.0–48.0)
pCO2 arterial: 41.6 mmHg (ref 32.0–48.0)
pCO2 arterial: 43 mmHg (ref 32.0–48.0)
pH, Arterial: 7.36 (ref 7.350–7.450)
pH, Arterial: 7.385 (ref 7.350–7.450)
pH, Arterial: 7.381 (ref 7.350–7.450)
pO2, Arterial: 155 mmHg — ABNORMAL HIGH (ref 83.0–108.0)
pO2, Arterial: 116 mmHg — ABNORMAL HIGH (ref 83.0–108.0)
pO2, Arterial: 120 mmHg — ABNORMAL HIGH (ref 83.0–108.0)

## 2020-04-17 LAB — TYPE AND SCREEN
ABO/RH(D): O POS
Antibody Screen: NEGATIVE
Unit division: 0
Unit division: 0

## 2020-04-17 LAB — BPAM RBC
Blood Product Expiration Date: 202202082359
Blood Product Expiration Date: 202202082359
ISSUE DATE / TIME: 202201031242
ISSUE DATE / TIME: 202201031242
Unit Type and Rh: 5100
Unit Type and Rh: 5100

## 2020-04-17 LAB — CBC
HCT: 23.3 % — ABNORMAL LOW (ref 36.0–46.0)
Hemoglobin: 7.7 g/dL — ABNORMAL LOW (ref 12.0–15.0)
MCH: 32.2 pg (ref 26.0–34.0)
MCHC: 33 g/dL (ref 30.0–36.0)
MCV: 97.5 fL (ref 80.0–100.0)
Platelets: 142 10*3/uL — ABNORMAL LOW (ref 150–400)
RBC: 2.39 MIL/uL — ABNORMAL LOW (ref 3.87–5.11)
RDW: 13.1 % (ref 11.5–15.5)
WBC: 6 10*3/uL (ref 4.0–10.5)
nRBC: 0 % (ref 0.0–0.2)

## 2020-04-17 MED ORDER — IPRATROPIUM-ALBUTEROL 0.5-2.5 (3) MG/3ML IN SOLN
3.0000 mL | Freq: Once | RESPIRATORY_TRACT | Status: AC
  Administered 2020-04-17: 3 mL via RESPIRATORY_TRACT
  Filled 2020-04-17: qty 3

## 2020-04-17 MED ORDER — KETOROLAC TROMETHAMINE 30 MG/ML IJ SOLN
30.0000 mg | Freq: Three times a day (TID) | INTRAMUSCULAR | Status: DC
  Administered 2020-04-17 – 2020-04-19 (×6): 30 mg via INTRAVENOUS
  Filled 2020-04-17 (×6): qty 1

## 2020-04-17 MED ORDER — HYDROMORPHONE HCL 1 MG/ML IJ SOLN: 0.5000 mg | Freq: Four times a day (QID) | INTRAMUSCULAR | Status: DC | PRN

## 2020-04-17 MED ORDER — BETHANECHOL CHLORIDE 25 MG PO TABS
25.0000 mg | ORAL_TABLET | Freq: Three times a day (TID) | ORAL | Status: DC
Start: 2020-04-17 — End: 2020-04-23
  Administered 2020-04-17 – 2020-04-22 (×18): 25 mg via ORAL
  Filled 2020-04-17 (×6): qty 3
  Filled 2020-04-17 (×4): qty 1
  Filled 2020-04-17: qty 3
  Filled 2020-04-17 (×2): qty 1
  Filled 2020-04-17: qty 3
  Filled 2020-04-17 (×3): qty 1
  Filled 2020-04-17: qty 3
  Filled 2020-04-17: qty 1

## 2020-04-17 MED ORDER — TRAMADOL HCL 50 MG PO TABS
100.0000 mg | ORAL_TABLET | Freq: Four times a day (QID) | ORAL | Status: DC
  Administered 2020-04-17 – 2020-04-24 (×28): 100 mg via ORAL
  Filled 2020-04-17 (×29): qty 2

## 2020-04-17 MED ORDER — ACETAMINOPHEN 500 MG PO TABS
1000.0000 mg | ORAL_TABLET | Freq: Three times a day (TID) | ORAL | Status: DC
  Administered 2020-04-17 – 2020-04-19 (×6): 1000 mg via ORAL
  Filled 2020-04-17 (×6): qty 2

## 2020-04-17 MED ORDER — HYDROMORPHONE HCL 1 MG/ML IJ SOLN
0.5000 mg | INTRAMUSCULAR | Status: DC | PRN
  Administered 2020-04-17 – 2020-04-18 (×6): 1 mg via INTRAVENOUS
  Filled 2020-04-17 (×6): qty 1

## 2020-04-17 MED ORDER — POLYETHYLENE GLYCOL 3350 17 G PO PACK
17.0000 g | PACK | Freq: Two times a day (BID) | ORAL | Status: DC
  Administered 2020-04-17 – 2020-04-23 (×12): 17 g via ORAL
  Filled 2020-04-17 (×14): qty 1

## 2020-04-17 NOTE — TOC Progression Note (Signed)
Transition of Care [facility]) - Progression Note    Patient Details  Name: Marissa Smith MRN: [id number] Date of Birth: 07-04-1985  Transition of Care [facility]) CM/SW Contact  Glennon Mac, RN Phone Number: 04/17/2020, 10:02 AM  Clinical Narrative:  Referral to [facility] for home health follow up.  Agency able to staff physical therapist only in the home, but states they will assess for OT needs as well.        Expected Discharge Plan: Home w Home Health Services Barriers to Discharge: Continued Medical Work up  Expected Discharge Plan and Services Expected Discharge Plan: Home w Home Health Services   Discharge Planning Services: CM Consult Post Acute Care Choice: Home Health Living arrangements for the past 2 months: Single Family Home                 DME Arranged: 3-N-1,Tub bench DME Agency: [employer] Date DME Agency Contacted: 04/16/20 Time DME Agency Contacted: 1710 Representative spoke with at DME Agency: Velna Hatchet HH Arranged: PT,OT HH Agency: [facility] Date HH Agency Contacted: 04/17/20 Time HH Agency Contacted: 1002 Representative spoke with at [facility] Agency: Alcario Drought   Social Determinants of Health (SDOH) Interventions    Readmission Risk Interventions No flowsheet data found.  Quintella Baton, RN, BSN  Trauma/Neuro ICU Case Manager [phone]
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
## 2020-04-17 NOTE — Progress Notes (Signed)
Orthopaedic Trauma Progress Note  SUBJECTIVE: Doing fair this morning. Reports moderate pain about operative sites in left upper and lower extremities, elbow hurting more than hip. Is currently due for some pain medication.   Has been tolerating Tramadol, notes a small headache currently but no other issues noted with it.   Patient also notes some chest/rib pain this morning, particularly with deep breathing and coughing. Feeling some tingling through her left leg, feels like it is nerve related. No SOB. No nausea/vomiting. No other complaints. Has not been back up out of bed since being transitioned to Cam boot on RLE yesterday. Has asked we hold off on dressing changed on LUE/LLE for now due to pain.   OBJECTIVE:  Vitals:   04/17/20 0828 04/17/20 0907  BP: (!) 97/51   Pulse: 82 76  Resp:    Temp: 99.3 F (37.4 C)   SpO2: 96% 97%    General: Sitting up on edge of bed, no acute distress Respiratory: No increased work of breathing.  LLE: Dressing over hip is clean, dry, intact.  Tender with palpation. Minimal sensation to light touch over the plantar aspect of foot.  Sensation intact over dorsal aspect but notes this is slightly diminished compared to contralateral side.+ EHL but cannot elicit FHL.  Is unable to actively dorsiflex ankle but does tolerate passive motion.  Compartments soft and compressible.  Skin warm and dry.+ DP pulse LUE: Dressing clean, dry, intact, sling in place.  Able to wiggle fingers.  Tolerates very small amount of wrist and elbow motion, but is limited secondary to pain.  Compartments soft and compressible.  Skin warm and dry.  Fingers well-perfused RLE: Splint in place.  Endorses sensation to light touch over all aspects of toes as well as dorsum plantar aspect of the forefoot.  Able to wiggle toes.  Foot warm and dry, toes well-perfused  IMAGING: Stable post op imaging.   LABS:  Results for orders placed or performed during the hospital encounter of 04/13/20 (from  the past 24 hour(s))  CBC     Status: Abnormal   Collection Time: 04/17/20  3:28 AM  Result Value Ref Range   WBC 6.0 4.0 - 10.5 K/uL   RBC 2.39 (L) 3.87 - 5.11 MIL/uL   Hemoglobin 7.7 (L) 12.0 - 15.0 g/dL   HCT 32.2 (L) 02.5 - 42.7 %   MCV 97.5 80.0 - 100.0 fL   MCH 32.2 26.0 - 34.0 pg   MCHC 33.0 30.0 - 36.0 g/dL   RDW 06.2 37.6 - 28.3 %   Platelets 142 (L) 150 - 400 K/uL   nRBC 0.0 0.0 - 0.2 %    ASSESSMENT: Marissa Smith is a 35 y.o. female, 2 Days Post-Op  Injuries: 1. Left posterior column/posterior wall acetabular fracture dislocation with sciatic nerve palsy s/p ORIF with sciatic neuroplasty 2. Left open proximal ulnar fracture s/p I&D and ORIF 3. Left ulna shaft fracture s/p ORIF 4. Right calcaneus fracture s/p non-op treatment  CV/Blood loss: Acute blood loss anemia, Hgb 7.7 this morning. Continue to monitor CBC  PLAN: Weightbearing: NWB LUE and LLE. WB for transfers only RLE Incisional and dressing care:  Plan to change dressings tomorrow Showering: Hold off on showering for now Orthopedic device(s): CAM boot RLE Pain management:  1. Robaxin 1000 mg QID 2. Oxycodone 10-15 mg q 4 hours PRN 3. Neurontin 600 mg TID 4. Dilaudid 0.5-1 mg q 6 hours PRN 5. Tramadol  mg q 6 hours 6. Toradol 30  mg q 8 hours VTE prophylaxis: Lovenox, SCDs ID:  Ancef 2gm post op completed Foley/Lines:  D/C foley today. KVO IVFs Impediments to Fracture Healing: Polytrauma.  Vitamin D level 17, will start on supplementation today. Dispo: Therapies as tolerated Follow - up plan: 2 weeks after discharge for repeat x-rays  Contact information:  Truitt Merle MD, Ulyses Southward PA-C. After hours and holidays please check [email or web address] for group call information for [facility]   Charmane Protzman A. Michaelyn Barter, PA-C [phone] (office) [email or web address]

## 2020-04-17 NOTE — Progress Notes (Signed)
Pt foley removed per order and peri care completed at 2030. Pt due to void. Will continue to closely monitor. Dionne Bucy RN
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
## 2020-04-18 ENCOUNTER — Inpatient Hospital Stay (HOSPITAL_COMMUNITY): Payer: Medicaid Other

## 2020-04-18 LAB — BASIC METABOLIC PANEL
Anion gap: 6 (ref 5–15)
BUN: 17 mg/dL (ref 6–20)
CO2: 26 mmol/L (ref 22–32)
Calcium: 8.3 mg/dL — ABNORMAL LOW (ref 8.9–10.3)
Chloride: 105 mmol/L (ref 98–111)
Creatinine, Ser: 0.64 mg/dL (ref 0.44–1.00)
GFR, Estimated: >60 mL/min (ref >60)
Glucose, Bld: 108 mg/dL — ABNORMAL HIGH (ref 70–99)
Potassium: 4.1 mmol/L (ref 3.5–5.1)
Sodium: 137 mmol/L (ref 135–145)

## 2020-04-18 LAB — CBC
HCT: 22.8 % — ABNORMAL LOW (ref 36.0–46.0)
Hemoglobin: 7.7 g/dL — ABNORMAL LOW (ref 12.0–15.0)
MCH: 32.9 pg (ref 26.0–34.0)
MCHC: 33.8 g/dL (ref 30.0–36.0)
MCV: 97.4 fL (ref 80.0–100.0)
Platelets: 178 10*3/uL (ref 150–400)
RBC: 2.34 MIL/uL — ABNORMAL LOW (ref 3.87–5.11)
RDW: 13.3 % (ref 11.5–15.5)
WBC: 5.9 10*3/uL (ref 4.0–10.5)
nRBC: 0 % (ref 0.0–0.2)

## 2020-04-18 MED ORDER — HYDROMORPHONE HCL 1 MG/ML IJ SOLN
0.5000 mg | INTRAMUSCULAR | Status: DC | PRN
  Administered 2020-04-18 – 2020-04-19 (×4): 1 mg via INTRAVENOUS
  Administered 2020-04-19: 0.5 mg via INTRAVENOUS
  Administered 2020-04-19 – 2020-04-22 (×10): 1 mg via INTRAVENOUS
  Filled 2020-04-18 (×15): qty 1
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
## 2020-04-18 NOTE — Progress Notes (Signed)
Foley catheter (16 Fr.) placed using sterile technique and two person assist d/t retention. 10 cc balloon filled.  Inserted by Kirt Boys, NT+3. Bladder scan performed before insertion showing residual amount of 308 mL. Amount voided via foley, 450 mL

## 2020-04-18 NOTE — Progress Notes (Signed)
Orthopaedic Trauma Progress Note  SUBJECTIVE: Continues to have pain in left elbow, left hip, right foot.  No severe pain is improving some.  Had chest tube removed this morning.  Foley was removed last night patient was unable to void, had I/O catheter this morning.  OBJECTIVE:  Vitals:   04/18/20 0334 04/18/20 0847  BP: (!) 107/57 (!) 101/53  Pulse: 71 66  Resp: 18 18  Temp: 98.1 F (36.7 C) 97.6 F (36.4 C)  SpO2: 95% 96%    General: Sitting up in bed, no acute distress Respiratory: No increased work of breathing.  LLE: Dressing over hip removed, incision is clean, dry, intact.  Tender with palpation. Minimal sensation to light touch over the plantar aspect of foot.  Sensation intact over dorsal aspect but notes this is slightly diminished compared to contralateral side.+ EHL but cannot elicit FHL.  Is unable to actively dorsiflex ankle but does tolerate passive motion.  Compartments soft and compressible.  Skin warm and dry.+ DP pulse LUE: Dressing and sling removed, incisions are clean, dry, intact with Steri-Strips in place.  Able to wiggle fingers.  Tolerates very small amount of wrist and elbow motion, but is limited secondary to pain.  Compartments soft and compressible.  Skin warm and dry.  Fingers well-perfused RLE: Cam boot in place.  Endorses sensation to light touch over all aspects of toes as well as dorsum plantar aspect of the forefoot.  Able to wiggle toes.  Foot warm and dry, toes well-perfused  IMAGING: Stable post op imaging.   LABS:  Results for orders placed or performed during the hospital encounter of 04/13/20 (from the past 24 hour(s))  CBC     Status: Abnormal   Collection Time: 04/18/20  3:09 AM  Result Value Ref Range   WBC 5.9 4.0 - 10.5 K/uL   RBC 2.34 (L) 3.87 - 5.11 MIL/uL   Hemoglobin 7.7 (L) 12.0 - 15.0 g/dL   HCT 93.7 (L) 90.2 - 40.9 %   MCV 97.4 80.0 - 100.0 fL   MCH 32.9 26.0 - 34.0 pg   MCHC 33.8 30.0 - 36.0 g/dL   RDW 73.5 32.9 - 92.4 %    Platelets 178 150 - 400 K/uL   nRBC 0.0 0.0 - 0.2 %  Basic metabolic panel     Status: Abnormal   Collection Time: 04/18/20  3:09 AM  Result Value Ref Range   Sodium 137 135 - 145 mmol/L   Potassium 4.1 3.5 - 5.1 mmol/L   Chloride 105 98 - 111 mmol/L   CO2 26 22 - 32 mmol/L   Glucose, Bld 108 (H) 70 - 99 mg/dL   BUN 17 6 - 20 mg/dL   Creatinine, Ser 2.68 0.44 - 1.00 mg/dL   Calcium 8.3 (L) 8.9 - 10.3 mg/dL   GFR, Estimated >34 >19 mL/min   Anion gap 6 5 - 15    ASSESSMENT: Marissa Smith is a 35 y.o. female, 3 Days Post-Op  Injuries: 1. Left posterior column/posterior wall acetabular fracture dislocation with sciatic nerve palsy s/p ORIF with sciatic neuroplasty 2. Left open proximal ulnar fracture s/p I&D and ORIF 3. Left ulna shaft fracture s/p ORIF 4. Right calcaneus fracture s/p non-op treatment  CV/Blood loss: Acute blood loss anemia, Hgb 7.7 this morning. Continue to monitor CBC  PLAN: Weightbearing: NWB LUE and LLE. WB for transfers only RLE Incisional and dressing care: Okay to leave incisions open to air  Showering: Okay to begin showering with assistance,  Steri-Strips on left upper and lower extremities Hobbins get wet. Orthopedic device(s): CAM boot RLE Pain management: Per trauma VTE prophylaxis: Lovenox, SCDs ID:  Ancef 2gm post op completed Foley/Lines: No Foley.  KVO IVFs Impediments to Fracture Healing: Polytrauma.  Vitamin D level 17, continue  Dispo: Therapies as tolerated, PT/OT recommending CIR.  Patient okay for discharge next venue for orthopedic standpoint once bed available Follow - up plan: 2 weeks after discharge for repeat x-rays  Contact information:  Truitt Merle MD, Ulyses Southward PA-C. After hours and holidays please check [email or web address] for group call information for [facility]   Darcy Cordner A. Michaelyn Barter, PA-C [phone] (office) [email or web address]

## 2020-04-18 NOTE — Progress Notes (Signed)
Pt attempted to void during shift since removal of foley but unsuccessful after multiple attempt on the bedpan. Pt bladder scanned and I&O cath. Will report off to oncoming RN. Arabella Merles Dawnell Bryant RN   04/18/20 0700  Output (mL)  Urine 350 mL  Urine Characteristics  Urinary Incontinence No  Urine Color Amber  Urine Appearance Clear  Urinary Interventions Bladder scan;Intermittent/Straight cath  Bladder Scan Volume (mL) 285 mL  Intermittent/Straight Cath (mL) 350 mL  Intermittent Catheter Size 16
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
## 2020-04-18 NOTE — Plan of Care (Signed)
  Problem: Education: Goal: Knowledge of General Education information will improve Description: Including pain rating scale, medication(s)/side effects and non-pharmacologic comfort measures Outcome: Progressing   Problem: Health Behavior/Discharge Planning: Goal: Ability to manage health-related needs will improve Outcome: Progressing   Problem: Clinical Measurements: Goal: Ability to maintain clinical measurements within normal limits will improve Outcome: Progressing Goal: Will remain free from infection Outcome: Progressing Goal: Diagnostic test results will improve Outcome: Progressing Goal: Respiratory complications will improve Outcome: Progressing Goal: Cardiovascular complication will be avoided Outcome: Progressing   Problem: Activity: Goal: Risk for activity intolerance will decrease Outcome: Progressing   Problem: Nutrition: Goal: Adequate nutrition will be maintained Outcome: Progressing   Problem: Coping: Goal: Level of anxiety will decrease Outcome: Progressing   Problem: Elimination: Goal: Will not experience complications related to bowel motility Outcome: Progressing Goal: Will not experience complications related to urinary retention Outcome: Progressing   Problem: Safety: Goal: Ability to remain free from injury will improve Outcome: Progressing   Problem: Pain Managment: Goal: General experience of comfort will improve Outcome: Progressing   Problem: Skin Integrity: Goal: Risk for impaired skin integrity will decrease Outcome: Progressing   Problem: Activity: Goal: Ability to avoid complications of mobility impairment will improve Outcome: Progressing Goal: Ability to tolerate increased activity will improve Outcome: Progressing
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
## 2020-04-19 ENCOUNTER — Ambulatory Visit: Payer: Medicaid Other | Admitting: Internal Medicine

## 2020-04-19 ENCOUNTER — Inpatient Hospital Stay (HOSPITAL_COMMUNITY): Payer: Medicaid Other

## 2020-04-19 ENCOUNTER — Encounter (HOSPITAL_COMMUNITY): Payer: Self-pay | Admitting: Student

## 2020-04-19 LAB — BASIC METABOLIC PANEL
Anion gap: 9 (ref 5–15)
BUN: 14 mg/dL (ref 6–20)
CO2: 23 mmol/L (ref 22–32)
Calcium: 8.2 mg/dL — ABNORMAL LOW (ref 8.9–10.3)
Chloride: 104 mmol/L (ref 98–111)
Creatinine, Ser: 0.56 mg/dL (ref 0.44–1.00)
GFR, Estimated: >60 mL/min (ref >60)
Glucose, Bld: 107 mg/dL — ABNORMAL HIGH (ref 70–99)
Potassium: 4.2 mmol/L (ref 3.5–5.1)
Sodium: 136 mmol/L (ref 135–145)

## 2020-04-19 MED ORDER — PREGABALIN 100 MG PO CAPS
100.0000 mg | ORAL_CAPSULE | Freq: Two times a day (BID) | ORAL | Status: DC
  Administered 2020-04-19 – 2020-04-22 (×6): 100 mg via ORAL
  Filled 2020-04-19 (×6): qty 1

## 2020-04-19 MED ORDER — KETOROLAC TROMETHAMINE 30 MG/ML IJ SOLN
30.0000 mg | Freq: Four times a day (QID) | INTRAMUSCULAR | Status: AC
  Administered 2020-04-19 – 2020-04-23 (×16): 30 mg via INTRAVENOUS
  Filled 2020-04-19 (×16): qty 1

## 2020-04-19 MED ORDER — ACETAMINOPHEN 500 MG PO TABS
1000.0000 mg | ORAL_TABLET | Freq: Four times a day (QID) | ORAL | Status: DC
  Administered 2020-04-19 – 2020-04-24 (×19): 1000 mg via ORAL
  Filled 2020-04-19 (×21): qty 2
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
## 2020-04-19 NOTE — Discharge Instructions (Signed)
PNEUMOTHORAX OR HEMOTHORAX +/- RIB FRACTURES  HOME INSTRUCTIONS   1. PAIN CONTROL:  1. Pain is best controlled by a usual combination of three different methods TOGETHER:  i. Ice/Heat ii. Over the counter pain medication iii. Prescription pain medication 2. You Guaman experience some swelling and bruising in area of broken ribs. Ice packs or heating pads (30-60 minutes up to 6 times a day) will help. Use ice for the first few days to help decrease swelling and bruising, then switch to heat to help relax tight/sore spots and speed recovery. Some people prefer to use ice alone, heat alone, alternating between ice & heat. Experiment to what works for you. Swelling and bruising can take several weeks to resolve.  3. It is helpful to take an over-the-counter pain medication regularly for the first few weeks. Choose one of the following that works best for you:  i. Naproxen (Aleve, etc) Two 220mg tabs twice a day ii. Ibuprofen (Advil, etc) Three 200mg tabs four times a day (every meal & bedtime) iii. Acetaminophen (Tylenol, etc) 500-650mg four times a day (every meal & bedtime) 4. A prescription for pain medication (such as oxycodone, hydrocodone, etc) Conaty be given to you upon discharge. Take your pain medication as prescribed.  i. If you are having problems/concerns with the prescription medicine (does not control pain, nausea, vomiting, rash, itching, etc), please call us [phone] to see if we need to switch you to a different pain medicine that will work better for you and/or control your side effect better. ii. If you need a refill on your pain medication, please contact your pharmacy. They will contact our office to request authorization. Prescriptions will not be filled after 5 pm or on week-ends. 1. Avoid getting constipated. When taking pain medications, it is common to experience some constipation. Increasing fluid intake and taking a fiber supplement (such as Metamucil, Citrucel, FiberCon,  MiraLax, etc) 1-2 times a day regularly will usually help prevent this problem from occurring. A mild laxative (prune juice, Milk of Magnesia, MiraLax, etc) should be taken according to package directions if there are no bowel movements after 48 hours.  2. Watch out for diarrhea. If you have many loose bowel movements, simplify your diet to bland foods & liquids for a few days. Stop any stool softeners and decrease your fiber supplement. Switching to mild anti-diarrheal medications (Kayopectate, Pepto Bismol) can help. If this worsens or does not improve, please call us. 3. Chest tube site wound: you Wellman remove the dressing from your chest tube site 3 days after the removal of your chest tube. DO NOT shower over the dressing. Once   removed, you Aleshire shower as normal. Do not submerge your wound in water for 2-3 weeks.  4. FOLLOW UP  a. Please call our office to set up or confirm an appointment for follow up for 2 weeks after discharge. You will need to get a chest xray at either [facility] or [facility]. This will be outlined in your follow up instructions. Please call [facility] at [phone] if you have any questions about follow up.  b. If you have any orthopedic or other injuries you will need to follow up as outlined in your follow up instructions.   WHEN TO CALL US [phone]:  1. Poor pain control 2. Reactions / problems with new medications (rash/itching, nausea, etc)  3. Fever over 101.5 F (38.5 C) 4. Worsening swelling or bruising 5. Redness, drainage, pain or swelling around chest   tube site 6. Worsening pain, productive cough, difficulty breathing or any other concerning symptoms  The clinic staff is available to answer your questions during regular business hours (8:30am-5pm). Please don't hesitate to call and ask to speak to one of our nurses for clinical concerns.  If you have a medical emergency, go to the nearest emergency room or call 911.  A surgeon from [facility] is always on call at the hospitals   [facility]  [address], [address], [place], [place] [address] ?  MAIN: [phone] ? TOLL FREE: [phone] ?  FAX [phone]  [email or web address]      Information on Rib Fractures  A rib fracture is a break or crack in one of the bones of the ribs. The ribs are long, curved bones that wrap around your chest and attach to your spine and your breastbone. The ribs protect your heart, lungs, and other organs in the chest. A broken or cracked rib is often painful but is not usually serious. Most rib fractures heal on their own over time. However, rib fractures can be more serious if multiple ribs are broken or if broken ribs move out of place and push against other structures or organs. What are the causes? This condition is caused by:  Repetitive movements with high force, such as pitching a baseball or having severe coughing spells.  A direct blow to the chest, such as a sports injury, a car accident, or a fall.  Cancer that has spread to the bones, which can weaken bones and cause them to break. What are the signs or symptoms? Symptoms of this condition include:  Pain when you breathe in or cough.  Pain when someone presses on the injured area.  Feeling short of breath. How is this diagnosed? This condition is diagnosed with a physical exam and medical history. Imaging tests Stegman also be done, such as:  Chest X-ray.  CT scan.  MRI.  Bone scan.  Chest ultrasound. How is this treated? Treatment for this condition depends on the severity of the fracture. Most rib fractures usually heal on their own in 1-3 months. Sometimes healing takes longer if there is a cough that does not stop or if there are other activities that make the injury worse (aggravating factors). While you heal, you will be given medicines to control the pain. You will also be taught deep breathing  exercises. Severe injuries Aries require hospitalization or surgery. Follow these instructions at home: Managing pain, stiffness, and swelling  If directed, apply ice to the injured area. ? Put ice in a plastic bag. ? Place a towel between your skin and the bag. ? Leave the ice on for 20 minutes, 2-3 times a day.  Take over-the-counter and prescription medicines only as told by your health care provider. Activity  Avoid a lot of activity and any activities or movements that cause pain. Be careful during activities and avoid bumping the injured rib.  Slowly increase your activity as told by your health care provider. General instructions  Do deep breathing exercises as told by your health care provider. This helps prevent pneumonia, which is a common complication of a broken rib. Your health care provider Diedrich instruct you to: ? Take deep breaths several times a day. ? Try to cough several times a day, holding a pillow against the injured area. ? Use a device called incentive spirometer to practice deep breathing several times a day.  Drink enough   fluid to keep your urine pale yellow.  Do not wear a rib belt or binder. These restrict breathing, which can lead to pneumonia.  Keep all follow-up visits as told by your health care provider. This is important. Contact a health care provider if:  You have a fever. Get help right away if:  You have difficulty breathing or you are short of breath.  You develop a cough that does not stop, or you cough up thick or bloody sputum.  You have nausea, vomiting, or pain in your abdomen.  Your pain gets worse and medicine does not help. Summary  A rib fracture is a break or crack in one of the bones of the ribs.  A broken or cracked rib is often painful but is not usually serious.  Most rib fractures heal on their own over time.  Treatment for this condition depends on the severity of the fracture.  Avoid a lot of activity and any  activities or movements that cause pain. This information is not intended to replace advice given to you by your health care provider. Make sure you discuss any questions you have with your health care provider. Document Released: 03/30/2005 Document Revised: 06/29/2016 Document Reviewed: 06/29/2016 Elsevier Interactive Patient Education  2019 Elsevier Inc.    Pneumothorax A pneumothorax is commonly called a collapsed lung. It is a condition in which air leaks from a lung and builds up between the thin layer of tissue that covers the lungs (visceral pleura) and the interior wall of the chest cavity (parietal pleura). The air gets trapped outside the lung, between the lung and the chest wall (pleural space). The air takes up space and prevents the lung from fully expanding. This condition sometimes occurs suddenly with no apparent cause. The buildup of air Knobel be small or large. A small pneumothorax Vieau go away on its own. A large pneumothorax will require treatment and hospitalization. What are the causes? This condition Grimley be caused by:  Trauma and injury to the chest wall.  Surgery and other medical procedures.  A complication of an underlying lung problem, especially chronic obstructive pulmonary disease (COPD) or emphysema. Sometimes the cause of this condition is not known. What increases the risk? You are more likely to develop this condition if:  You have an underlying lung problem.  You smoke.  You are 20-40 years old, female, tall, and underweight.  You have a personal or family history of pneumothorax.  You have an eating disorder (anorexia nervosa). This condition can also happen quickly, even in people with no history of lung problems. What are the signs or symptoms? Sometimes a pneumothorax will have no symptoms. When symptoms are present, they can include:  Chest pain.  Shortness of breath.  Increased rate of breathing.  Bluish color to your lips or skin  (cyanosis). How is this diagnosed? This condition Zabawa be diagnosed by:  A medical history and physical exam.  A chest X-ray, chest CT scan, or ultrasound. How is this treated? Treatment depends on how severe your condition is. The goal of treatment is to remove the extra air and allow your lung to expand back to its normal size.  For a small pneumothorax: ? No treatment Ammirati be needed. ? Extra oxygen is sometimes used to make it go away more quickly.  For a large pneumothorax or a pneumothorax that is causing symptoms, a procedure is done to drain the air from your lungs. To do this, a health care provider Mohammad   use: ? A needle with a syringe. This is used to suck air from a pleural space where no additional leakage is taking place. ? A chest tube. This is used to suck air where there is ongoing leakage into the pleural space. The chest tube Amano need to remain in place for several days until the air leak has healed.  In more severe cases, surgery Mahurin be needed to repair the damage that is causing the leak.  If you have multiple pneumothorax episodes or have an air leak that will not heal, a procedure called a pleurodesis Abramovich be done. A medicine is placed in the pleural space to irritate the tissues around the lung so that the lung will stick to the chest wall, seal any leaks, and stop any buildup of air in that space. If you have an underlying lung problem, severe symptoms, or a large pneumothorax you will usually need to stay in the hospital. Follow these instructions at home: Lifestyle  Do not use any products that contain nicotine or tobacco, such as cigarettes and e-cigarettes. These are major risk factors in pneumothorax. If you need help quitting, ask your health care provider.  Do not lift anything that is heavier than 10 lb (4.5 kg), or the limit that your health care provider tells you, until he or she says that it is safe.  Avoid activities that take a lot of effort (strenuous)  for as long as told by your health care provider.  Return to your normal activities as told by your health care provider. Ask your health care provider what activities are safe for you.  Do not fly in an airplane or scuba dive until your health care provider says it is okay. General instructions  Take over-the-counter and prescription medicines only as told by your health care provider.  If a cough or pain makes it difficult for you to sleep at night, try sleeping in a semi-upright position in a recliner or by using 2 or 3 pillows.  If you had a chest tube and it was removed, ask your health care provider when you can remove the bandage (dressing). While the dressing is in place, do not allow it to get wet.  Keep all follow-up visits as told by your health care provider. This is important. Contact a health care provider if:  You cough up thick mucus (sputum) that is yellow or green in color.  You were treated with a chest tube, and you have redness, increasing pain, or discharge at the site where it was placed. Get help right away if:  You have increasing chest pain or shortness of breath.  You have a cough that will not go away.  You begin coughing up blood.  You have pain that is getting worse or is not controlled with medicines.  The site where your chest tube was located opens up.  You feel air coming out of the site where the chest tube was placed.  You have a fever or persistent symptoms for more than 2-3 days.  You have a fever and your symptoms suddenly get worse. These symptoms Polimeni represent a serious problem that is an emergency. Do not wait to see if the symptoms will go away. Get medical help right away. Call your local emergency services (911 in the [place] Do not drive yourself to the hospital. Summary  A pneumothorax, commonly called a collapsed lung, is a condition in which air leaks from a lung and gets trapped between the   lung and the chest wall (pleural  space).  The buildup of air Clock be small or large. A small pneumothorax Alipio go away on its own. A large pneumothorax will require treatment and hospitalization.  Treatment for this condition depends on how severe the pneumothorax is. The goal of treatment is to remove the extra air and allow the lung to expand back to its normal size. This information is not intended to replace advice given to you by your health care provider. Make sure you discuss any questions you have with your health care provider. Document Released: 03/30/2005 Document Revised: 03/08/2017 Document Reviewed: 03/08/2017 Elsevier Interactive Patient Education  2019 Elsevier Inc.   

## 2020-04-19 NOTE — Progress Notes (Signed)
Lower extremity venous attempted. Patient in chair per physical therapy instructions. Will attempt again as schedule permits.   Jean Rosenthal, RDMS

## 2020-04-19 NOTE — Progress Notes (Signed)
Attempted to call report to 5N. Unit did not answer. Will try again later.
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
## 2020-04-20 ENCOUNTER — Encounter (HOSPITAL_COMMUNITY): Payer: Medicaid Other

## 2020-04-20 LAB — BASIC METABOLIC PANEL
Anion gap: 7 (ref 5–15)
BUN: 16 mg/dL (ref 6–20)
CO2: 27 mmol/L (ref 22–32)
Calcium: 8.4 mg/dL — ABNORMAL LOW (ref 8.9–10.3)
Chloride: 102 mmol/L (ref 98–111)
Creatinine, Ser: 0.64 mg/dL (ref 0.44–1.00)
GFR, Estimated: >60 mL/min (ref >60)
Glucose, Bld: 96 mg/dL (ref 70–99)
Potassium: 5.1 mmol/L (ref 3.5–5.1)
Sodium: 136 mmol/L (ref 135–145)

## 2020-04-20 MED ORDER — BISACODYL 10 MG RE SUPP
10.0000 mg | Freq: Once | RECTAL | Status: AC
  Administered 2020-04-20: 10 mg via RECTAL
  Filled 2020-04-20: qty 1

## 2020-04-20 NOTE — Progress Notes (Signed)
Orthopedic Tech Progress Note Patient Details:  Marissa Smith [birth year] [id number]  Ortho Devices Type of Ortho Device: Prafo boot/shoe Ortho Device/Splint Location: LLE Ortho Device/Splint Interventions: Application,Ordered   Post Interventions Patient Tolerated: Well Instructions Provided: Care of device   Christophor Eick A Aditri Louischarles 04/20/2020, 1:05 PM

## 2020-04-20 NOTE — Plan of Care (Signed)
  Problem: Activity: Goal: Risk for activity intolerance will decrease Outcome: Progressing   Problem: Elimination: Goal: Will not experience complications related to bowel motility Outcome: Progressing   Problem: Pain Managment: Goal: General experience of comfort will improve Outcome: Progressing   Problem: Safety: Goal: Ability to remain free from injury will improve Outcome: Progressing   Problem: Skin Integrity: Goal: Signs of wound healing will improve Outcome: Progressing   Problem: Tissue Perfusion: Goal: Ability to maintain adequate tissue perfusion will improve Outcome: Progressing

## 2020-04-20 NOTE — Plan of Care (Signed)
  Problem: Coping: Goal: Level of anxiety will decrease Outcome: Progressing   Problem: Pain Management: Goal: Pain level will decrease Outcome: Progressing   Problem: Skin Integrity: Goal: Signs of wound healing will improve Outcome: Progressing   Problem: Tissue Perfusion: Goal: Ability to maintain adequate tissue perfusion will improve Outcome: Progressing
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
## 2020-04-21 ENCOUNTER — Encounter (HOSPITAL_COMMUNITY): Payer: Medicaid Other

## 2020-04-21 NOTE — Progress Notes (Signed)
RN notified MD on call of pt complaints of left calf pain. No orders at this time. MD plans to evaluate on rounding. Will continue to monitor.

## 2020-04-21 NOTE — Plan of Care (Signed)
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
## 2020-04-22 ENCOUNTER — Inpatient Hospital Stay (HOSPITAL_COMMUNITY): Payer: Medicaid Other

## 2020-04-22 DIAGNOSIS — R52 Pain, unspecified: Secondary | ICD-10-CM

## 2020-04-22 DIAGNOSIS — M7989 Other specified soft tissue disorders: Secondary | ICD-10-CM | POA: Diagnosis not present

## 2020-04-22 MED ORDER — HYDROMORPHONE HCL 1 MG/ML IJ SOLN
0.5000 mg | Freq: Three times a day (TID) | INTRAMUSCULAR | Status: DC | PRN
  Administered 2020-04-22 – 2020-04-23 (×3): 1 mg via INTRAVENOUS
  Filled 2020-04-22 (×4): qty 1

## 2020-04-22 MED ORDER — PREGABALIN 75 MG PO CAPS
150.0000 mg | ORAL_CAPSULE | Freq: Two times a day (BID) | ORAL | Status: DC
  Administered 2020-04-22 – 2020-04-24 (×4): 150 mg via ORAL
  Filled 2020-04-22 (×4): qty 2
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
## 2020-04-22 NOTE — Progress Notes (Signed)
Lower extremity venous bilateral study completed.   Please see CV Proc for preliminary results.   Brynn Mulgrew, RDMS  

## 2020-04-22 NOTE — Progress Notes (Signed)
Inpatient Rehab Admissions Coordinator:   Insurance has approved inpatient rehab.  Will follow for timing of admission pending bed availability and pain control on PO medication.  Met with patient and her mom at bedside to give her an update.   Shann Medal, PT, DPT Admissions Coordinator [phone] 04/22/20  3:40 PM
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
## 2020-04-22 NOTE — Plan of Care (Signed)

## 2020-04-22 NOTE — Plan of Care (Signed)
?  Problem: Education: ?Goal: Knowledge of General Education information will improve ?Description: Including pain rating scale, medication(s)/side effects and non-pharmacologic comfort measures ?Outcome: Progressing ?  ?Problem: Clinical Measurements: ?Goal: Respiratory complications will improve ?Outcome: Progressing ?  ?Problem: Activity: ?Goal: Risk for activity intolerance will decrease ?Outcome: Progressing ?  ?Problem: Nutrition: ?Goal: Adequate nutrition will be maintained ?Outcome: Progressing ?  ?Problem: Coping: ?Goal: Level of anxiety will decrease ?Outcome: Progressing ?  ?Problem: Elimination: ?Goal: Will not experience complications related to bowel motility ?Outcome: Progressing ?Goal: Will not experience complications related to urinary retention ?Outcome: Progressing ?  ?Problem: Safety: ?Goal: Ability to remain free from injury will improve ?Outcome: Progressing ?  ?

## 2020-04-23 ENCOUNTER — Encounter: Payer: Self-pay | Admitting: General Practice

## 2020-04-23 LAB — BASIC METABOLIC PANEL
Anion gap: 9 (ref 5–15)
BUN: 22 mg/dL — ABNORMAL HIGH (ref 6–20)
CO2: 26 mmol/L (ref 22–32)
Calcium: 8.8 mg/dL — ABNORMAL LOW (ref 8.9–10.3)
Chloride: 102 mmol/L (ref 98–111)
Creatinine, Ser: 0.77 mg/dL (ref 0.44–1.00)
GFR, Estimated: >60 mL/min (ref >60)
Glucose, Bld: 82 mg/dL (ref 70–99)
Potassium: 4.6 mmol/L (ref 3.5–5.1)
Sodium: 137 mmol/L (ref 135–145)

## 2020-04-23 MED ORDER — HYDROMORPHONE HCL 1 MG/ML IJ SOLN
0.5000 mg | Freq: Every day | INTRAMUSCULAR | Status: DC | PRN
  Administered 2020-04-23 – 2020-04-24 (×2): 1 mg via INTRAVENOUS
  Filled 2020-04-23 (×2): qty 1

## 2020-04-23 MED ORDER — GUAIFENESIN ER 600 MG PO TB12
600.0000 mg | ORAL_TABLET | Freq: Two times a day (BID) | ORAL | Status: DC | PRN
  Administered 2020-04-23: 600 mg via ORAL

## 2020-04-23 MED ORDER — MELATONIN 3 MG PO TABS
3.0000 mg | ORAL_TABLET | Freq: Every day | ORAL | Status: DC
  Administered 2020-04-23: 3 mg via ORAL
  Filled 2020-04-23: qty 1
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
## 2020-04-23 NOTE — Plan of Care (Signed)

## 2020-04-23 NOTE — Progress Notes (Signed)
Inpatient Rehab Admissions Coordinator:   I have no beds available for this patient to admit to CIR today.  Will continue to follow for timing of potential admission pending bed availability.   Estill Dooms, PT, DPT Admissions Coordinator [phone] 04/23/20  12:18 PM
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
## 2020-04-23 NOTE — Progress Notes (Signed)
Patient ID: Marissa Smith, female   DOB: June 16, 1985, 35 y.o.   MRN: [id number] See other note. Weaning Dilaudid off. D/C tele. Eichler shower in chair with assist. Await CIR bed.  Violeta Gelinas, MD, MPH, FACS Please use [email or web address] to contact on call provider 04/23/2020 10:38 AM
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
## 2020-04-23 NOTE — Progress Notes (Signed)
   04/23/20 1100  Clinical Encounter Type  Visited With Patient  Visit Type Follow-up   Chaplain followed-up from visit last week. Pt was in much better spirits today. Chaplain engaged in active listening and emotional support. Pt is looking forward to a shower later today. Chaplain will follow-up as needed.  This note was prepared by Chaplain Resident, Tacy Learn, MDiv. For questions, please contact by phone at [phone].

## 2020-04-24 ENCOUNTER — Encounter (HOSPITAL_COMMUNITY): Payer: Self-pay | Admitting: Physical Medicine & Rehabilitation

## 2020-04-24 ENCOUNTER — Inpatient Hospital Stay (HOSPITAL_COMMUNITY)
Admission: RE | Admit: 2020-04-24 | Discharge: 2020-05-10 | DRG: 560 | Disposition: A | Discharge status: Home / Self Care | Payer: Medicaid Other | Source: Intra-hospital | Attending: Physical Medicine & Rehabilitation | Admitting: Physical Medicine & Rehabilitation

## 2020-04-24 ENCOUNTER — Other Ambulatory Visit: Payer: Self-pay

## 2020-04-24 DIAGNOSIS — S92009D Unspecified fracture of unspecified calcaneus, subsequent encounter for fracture with routine healing: Secondary | ICD-10-CM

## 2020-04-24 DIAGNOSIS — S52202D Unspecified fracture of shaft of left ulna, subsequent encounter for closed fracture with routine healing: Secondary | ICD-10-CM | POA: Diagnosis present

## 2020-04-24 DIAGNOSIS — E46 Unspecified protein-calorie malnutrition: Secondary | ICD-10-CM

## 2020-04-24 DIAGNOSIS — Z79899 Other long term (current) drug therapy: Secondary | ICD-10-CM

## 2020-04-24 DIAGNOSIS — M79605 Pain in left leg: Secondary | ICD-10-CM | POA: Diagnosis not present

## 2020-04-24 DIAGNOSIS — T148XXA Other injury of unspecified body region, initial encounter: Secondary | ICD-10-CM | POA: Diagnosis not present

## 2020-04-24 DIAGNOSIS — R252 Cramp and spasm: Secondary | ICD-10-CM | POA: Diagnosis not present

## 2020-04-24 DIAGNOSIS — Z720 Tobacco use: Secondary | ICD-10-CM

## 2020-04-24 DIAGNOSIS — D62 Acute posthemorrhagic anemia: Secondary | ICD-10-CM | POA: Diagnosis present

## 2020-04-24 DIAGNOSIS — F3130 Bipolar disorder, current episode depressed, mild or moderate severity, unspecified: Secondary | ICD-10-CM | POA: Diagnosis present

## 2020-04-24 DIAGNOSIS — K5903 Drug induced constipation: Secondary | ICD-10-CM

## 2020-04-24 DIAGNOSIS — I959 Hypotension, unspecified: Secondary | ICD-10-CM | POA: Diagnosis not present

## 2020-04-24 DIAGNOSIS — M5116 Intervertebral disc disorders with radiculopathy, lumbar region: Secondary | ICD-10-CM | POA: Diagnosis present

## 2020-04-24 DIAGNOSIS — S2241XD Multiple fractures of ribs, right side, subsequent encounter for fracture with routine healing: Secondary | ICD-10-CM

## 2020-04-24 DIAGNOSIS — M5442 Lumbago with sciatica, left side: Secondary | ICD-10-CM | POA: Diagnosis not present

## 2020-04-24 DIAGNOSIS — G8918 Other acute postprocedural pain: Secondary | ICD-10-CM | POA: Diagnosis not present

## 2020-04-24 DIAGNOSIS — R339 Retention of urine, unspecified: Secondary | ICD-10-CM

## 2020-04-24 DIAGNOSIS — T07XXXA Unspecified multiple injuries, initial encounter: Secondary | ICD-10-CM

## 2020-04-24 DIAGNOSIS — S32592D Other specified fracture of left pubis, subsequent encounter for fracture with routine healing: Secondary | ICD-10-CM

## 2020-04-24 DIAGNOSIS — E8809 Other disorders of plasma-protein metabolism, not elsewhere classified: Secondary | ICD-10-CM | POA: Diagnosis present

## 2020-04-24 DIAGNOSIS — Z23 Encounter for immunization: Secondary | ICD-10-CM | POA: Diagnosis not present

## 2020-04-24 DIAGNOSIS — F17210 Nicotine dependence, cigarettes, uncomplicated: Secondary | ICD-10-CM | POA: Diagnosis present

## 2020-04-24 DIAGNOSIS — Z6830 Body mass index (BMI) 30.0-30.9, adult: Secondary | ICD-10-CM | POA: Diagnosis not present

## 2020-04-24 DIAGNOSIS — S32402D Unspecified fracture of left acetabulum, subsequent encounter for fracture with routine healing: Secondary | ICD-10-CM

## 2020-04-24 DIAGNOSIS — Z885 Allergy status to narcotic agent status: Secondary | ICD-10-CM

## 2020-04-24 DIAGNOSIS — G5772 Causalgia of left lower limb: Secondary | ICD-10-CM

## 2020-04-24 DIAGNOSIS — G8929 Other chronic pain: Secondary | ICD-10-CM

## 2020-04-24 DIAGNOSIS — G90522 Complex regional pain syndrome I of left lower limb: Secondary | ICD-10-CM

## 2020-04-24 DIAGNOSIS — M792 Neuralgia and neuritis, unspecified: Secondary | ICD-10-CM

## 2020-04-24 DIAGNOSIS — R52 Pain, unspecified: Secondary | ICD-10-CM

## 2020-04-24 DIAGNOSIS — G5702 Lesion of sciatic nerve, left lower limb: Secondary | ICD-10-CM

## 2020-04-24 DIAGNOSIS — N943 Premenstrual tension syndrome: Secondary | ICD-10-CM

## 2020-04-24 LAB — CBC
HCT: 27 % — ABNORMAL LOW (ref 36.0–46.0)
Hemoglobin: 9 g/dL — ABNORMAL LOW (ref 12.0–15.0)
MCH: 32 pg (ref 26.0–34.0)
MCHC: 33.3 g/dL (ref 30.0–36.0)
MCV: 96.1 fL (ref 80.0–100.0)
Platelets: 587 10*3/uL — ABNORMAL HIGH (ref 150–400)
RBC: 2.81 MIL/uL — ABNORMAL LOW (ref 3.87–5.11)
RDW: 13.4 % (ref 11.5–15.5)
WBC: 6.7 10*3/uL (ref 4.0–10.5)
nRBC: 0 % (ref 0.0–0.2)

## 2020-04-24 LAB — CREATININE, SERUM
Creatinine, Ser: 0.69 mg/dL (ref 0.44–1.00)
GFR, Estimated: >60 mL/min (ref >60)

## 2020-04-24 MED ORDER — POLYETHYLENE GLYCOL 3350 17 G PO PACK
17.0000 g | PACK | Freq: Two times a day (BID) | ORAL | Status: DC
  Administered 2020-04-24 – 2020-05-10 (×29): 17 g via ORAL
  Filled 2020-04-24 (×32): qty 1

## 2020-04-24 MED ORDER — DIAZEPAM 5 MG PO TABS
5.0000 mg | ORAL_TABLET | Freq: Four times a day (QID) | ORAL | Status: DC | PRN
  Administered 2020-04-25 – 2020-05-10 (×14): 5 mg via ORAL
  Filled 2020-04-24 (×15): qty 1

## 2020-04-24 MED ORDER — ONDANSETRON HCL 4 MG/2ML IJ SOLN: 4.0000 mg | Freq: Four times a day (QID) | INTRAMUSCULAR | Status: DC | PRN

## 2020-04-24 MED ORDER — MELATONIN 3 MG PO TABS
3.0000 mg | ORAL_TABLET | Freq: Every day | ORAL | Status: DC
  Administered 2020-04-24 – 2020-05-09 (×12): 3 mg via ORAL
  Filled 2020-04-24 (×18): qty 1

## 2020-04-24 MED ORDER — METHOCARBAMOL 500 MG PO TABS
1000.0000 mg | ORAL_TABLET | Freq: Four times a day (QID) | ORAL | Status: DC
  Administered 2020-04-24 – 2020-05-10 (×63): 1000 mg via ORAL
  Filled 2020-04-24 (×63): qty 2

## 2020-04-24 MED ORDER — PREGABALIN 75 MG PO CAPS
150.0000 mg | ORAL_CAPSULE | Freq: Two times a day (BID) | ORAL | Status: DC
  Administered 2020-04-24 – 2020-04-27 (×6): 150 mg via ORAL
  Filled 2020-04-24 (×6): qty 2

## 2020-04-24 MED ORDER — ENOXAPARIN SODIUM 30 MG/0.3ML ~~LOC~~ SOLN
30.0000 mg | Freq: Two times a day (BID) | SUBCUTANEOUS | Status: DC
  Administered 2020-04-24 – 2020-05-10 (×32): 30 mg via SUBCUTANEOUS
  Filled 2020-04-24 (×32): qty 0.3

## 2020-04-24 MED ORDER — GUAIFENESIN ER 600 MG PO TB12: 600.0000 mg | ORAL_TABLET | Freq: Two times a day (BID) | ORAL | Status: DC | PRN

## 2020-04-24 MED ORDER — LIDOCAINE 5 % EX PTCH
1.0000 | MEDICATED_PATCH | CUTANEOUS | Status: DC
  Administered 2020-04-25 – 2020-05-01 (×7): 1 via TRANSDERMAL
  Filled 2020-04-24 (×7): qty 1

## 2020-04-24 MED ORDER — ONDANSETRON 4 MG PO TBDP: 4.0000 mg | ORAL_TABLET | Freq: Four times a day (QID) | ORAL | Status: DC | PRN

## 2020-04-24 MED ORDER — ALBUTEROL SULFATE HFA 108 (90 BASE) MCG/ACT IN AERS
1.0000 | INHALATION_SPRAY | Freq: Four times a day (QID) | RESPIRATORY_TRACT | Status: DC | PRN
  Filled 2020-04-24: qty 6.7

## 2020-04-24 MED ORDER — PAROXETINE HCL 20 MG PO TABS
20.0000 mg | ORAL_TABLET | Freq: Every day | ORAL | Status: DC
  Administered 2020-04-25 – 2020-05-10 (×16): 20 mg via ORAL
  Filled 2020-04-24 (×16): qty 1

## 2020-04-24 MED ORDER — ENOXAPARIN SODIUM 30 MG/0.3ML ~~LOC~~ SOLN: 30.0000 mg | Freq: Two times a day (BID) | SUBCUTANEOUS | Status: DC

## 2020-04-24 MED ORDER — VITAMIN D 25 MCG (1000 UNIT) PO TABS
2000.0000 [IU] | ORAL_TABLET | Freq: Every day | ORAL | Status: DC
  Administered 2020-04-25 – 2020-05-10 (×16): 2000 [IU] via ORAL
  Filled 2020-04-24 (×17): qty 2

## 2020-04-24 MED ORDER — HYDROXYZINE HCL 25 MG PO TABS: 25.0000 mg | ORAL_TABLET | Freq: Four times a day (QID) | ORAL | Status: DC | PRN

## 2020-04-24 MED ORDER — ENSURE ENLIVE PO LIQD
237.0000 mL | Freq: Two times a day (BID) | ORAL | Status: DC
  Administered 2020-04-24: 237 mL via ORAL

## 2020-04-24 MED ORDER — FAMOTIDINE 20 MG PO TABS
20.0000 mg | ORAL_TABLET | Freq: Two times a day (BID) | ORAL | Status: DC
  Administered 2020-04-24 – 2020-05-10 (×32): 20 mg via ORAL
  Filled 2020-04-24 (×32): qty 1

## 2020-04-24 MED ORDER — DOCUSATE SODIUM 100 MG PO CAPS
100.0000 mg | ORAL_CAPSULE | Freq: Two times a day (BID) | ORAL | Status: DC
  Administered 2020-04-24 – 2020-05-10 (×32): 100 mg via ORAL
  Filled 2020-04-24 (×32): qty 1

## 2020-04-24 MED ORDER — OXYCODONE HCL 5 MG PO TABS
10.0000 mg | ORAL_TABLET | ORAL | Status: DC | PRN
  Administered 2020-04-24 – 2020-04-26 (×10): 15 mg via ORAL
  Administered 2020-04-26: 10 mg via ORAL
  Administered 2020-04-26: 15 mg via ORAL
  Administered 2020-04-27 (×4): 10 mg via ORAL
  Administered 2020-04-27: 15 mg via ORAL
  Administered 2020-04-28 – 2020-04-29 (×7): 10 mg via ORAL
  Administered 2020-04-29: 15 mg via ORAL
  Administered 2020-04-29: 10 mg via ORAL
  Administered 2020-04-29: 15 mg via ORAL
  Administered 2020-04-30: 10 mg via ORAL
  Administered 2020-04-30: 15 mg via ORAL
  Administered 2020-04-30 (×2): 10 mg via ORAL
  Administered 2020-04-30 – 2020-05-01 (×2): 15 mg via ORAL
  Administered 2020-05-01: 10 mg via ORAL
  Administered 2020-05-01 – 2020-05-09 (×49): 15 mg via ORAL
  Administered 2020-05-10: 10 mg via ORAL
  Administered 2020-05-10: 15 mg via ORAL
  Administered 2020-05-10: 10 mg via ORAL
  Filled 2020-04-24 (×4): qty 3
  Filled 2020-04-24 (×2): qty 2
  Filled 2020-04-24 (×4): qty 3
  Filled 2020-04-24: qty 2
  Filled 2020-04-24 (×3): qty 3
  Filled 2020-04-24: qty 2
  Filled 2020-04-24 (×3): qty 3
  Filled 2020-04-24: qty 2
  Filled 2020-04-24 (×5): qty 3
  Filled 2020-04-24: qty 2
  Filled 2020-04-24: qty 3
  Filled 2020-04-24 (×2): qty 2
  Filled 2020-04-24: qty 3
  Filled 2020-04-24: qty 2
  Filled 2020-04-24 (×3): qty 3
  Filled 2020-04-24: qty 2
  Filled 2020-04-24 (×8): qty 3
  Filled 2020-04-24: qty 2
  Filled 2020-04-24 (×10): qty 3
  Filled 2020-04-24 (×2): qty 2
  Filled 2020-04-24 (×2): qty 3
  Filled 2020-04-24: qty 2
  Filled 2020-04-24 (×8): qty 3
  Filled 2020-04-24: qty 2
  Filled 2020-04-24 (×6): qty 3
  Filled 2020-04-24: qty 2
  Filled 2020-04-24 (×7): qty 3
  Filled 2020-04-24 (×2): qty 2
  Filled 2020-04-24 (×6): qty 3

## 2020-04-24 MED ORDER — ACETAMINOPHEN 325 MG PO TABS
325.0000 mg | ORAL_TABLET | ORAL | Status: DC | PRN
  Administered 2020-04-24 – 2020-05-05 (×8): 650 mg via ORAL
  Filled 2020-04-24 (×9): qty 2

## 2020-04-24 MED ORDER — TRAMADOL HCL 50 MG PO TABS
100.0000 mg | ORAL_TABLET | Freq: Four times a day (QID) | ORAL | Status: DC
Start: 2020-04-24 — End: 2020-04-25
  Administered 2020-04-24 – 2020-04-25 (×2): 100 mg via ORAL
  Filled 2020-04-24 (×2): qty 2

## 2020-04-24 MED ORDER — ENSURE ENLIVE PO LIQD
237.0000 mL | Freq: Two times a day (BID) | ORAL | Status: DC
  Administered 2020-04-25 – 2020-05-10 (×24): 237 mL via ORAL

## 2020-04-24 NOTE — Progress Notes (Signed)
Pt in bed VSS call light in reach. Pt educated to Rehab safety precautions and protocols. Questions answered.

## 2020-04-24 NOTE — Plan of Care (Signed)
  Problem: Education: Goal: Knowledge of General Education information will improve Description: Including pain rating scale, medication(s)/side effects and non-pharmacologic comfort measures Outcome: Progressing   Problem: Clinical Measurements: Goal: Respiratory complications will improve Outcome: Progressing   Problem: Activity: Goal: Risk for activity intolerance will decrease Outcome: Progressing   Problem: Nutrition: Goal: Adequate nutrition will be maintained Outcome: Progressing   Problem: Coping: Goal: Level of anxiety will decrease Outcome: Progressing   Problem: Elimination: Goal: Will not experience complications related to urinary retention Outcome: Progressing   
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
## 2020-04-24 NOTE — Progress Notes (Signed)
Inpatient Rehabilitation Medication Review by a Pharmacist  A complete drug regimen review was completed for this patient to identify any potential clinically significant medication issues.  Clinically significant medication issues were identified:  no  Check AMION for pharmacist assigned to patient if future medication questions/issues arise during this admission.   Time spent performing this drug regimen review (minutes):  5   Noah Delaine, [place] Clinical Pharmacist 04/24/2020 9:22 PM
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
## 2020-04-24 NOTE — TOC Transition Note (Signed)
Transition of Care [facility]) - CM/SW Discharge Note   Patient Details  Name: Marissa Smith MRN: [id number] Date of Birth: 1986-02-22  [facility] CM/SW Contact:  Glennon Mac, RN Phone Number: 04/24/2020, 5:34 PM   Clinical Narrative: Pt has been accepted for admission to [facility] CIR, for admission today.  Plan DC to inpatient rehab when bed available.    Final next level of care: IP Rehab Facility Barriers to Discharge: Barriers Resolved   Patient Goals and CMS Choice Patient states their goals for this hospitalization and ongoing recovery are:: to get home CMS Medicare.gov Compare Post Acute Care list provided to:: Patient Choice offered to / list presented to : Patient  Discharge Placement                       Discharge Plan and Services   Discharge Planning Services: CM Consult Post Acute Care Choice: IP Rehab          DME Arranged: 3-N-1,Tub bench DME Agency: [employer] Date DME Agency Contacted: 04/16/20 Time DME Agency Contacted: 1710 Representative spoke with at DME Agency: Velna Hatchet HH Arranged: PT,OT HH Agency: [facility] Date HH Agency Contacted: 04/17/20 Time HH Agency Contacted: 1002 Representative spoke with at [facility] Agency: Alcario Drought  Social Determinants of Health (SDOH) Interventions     Readmission Risk Interventions No flowsheet data found.  Quintella Baton, RN, BSN  Trauma/Neuro ICU Case Manager [phone]

## 2020-04-24 NOTE — Progress Notes (Signed)
Inpatient Rehab Admissions Coordinator:    I have insurance approval and a bed available for pt to admit to CIR today. Bailey Mech, PA-C, in agreement.  Will let pt/family and TOC team know.   Estill Dooms, PT, DPT Admissions Coordinator [phone] 04/24/20  11:00 AM
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
## 2020-04-25 ENCOUNTER — Inpatient Hospital Stay (HOSPITAL_COMMUNITY): Payer: Medicaid Other

## 2020-04-25 ENCOUNTER — Inpatient Hospital Stay (HOSPITAL_COMMUNITY): Payer: Medicaid Other | Admitting: Occupational Therapy

## 2020-04-25 DIAGNOSIS — D62 Acute posthemorrhagic anemia: Secondary | ICD-10-CM

## 2020-04-25 DIAGNOSIS — T07XXXA Unspecified multiple injuries, initial encounter: Secondary | ICD-10-CM

## 2020-04-25 DIAGNOSIS — R339 Retention of urine, unspecified: Secondary | ICD-10-CM

## 2020-04-25 DIAGNOSIS — G8918 Other acute postprocedural pain: Secondary | ICD-10-CM

## 2020-04-25 DIAGNOSIS — E8809 Other disorders of plasma-protein metabolism, not elsewhere classified: Secondary | ICD-10-CM

## 2020-04-25 DIAGNOSIS — K5903 Drug induced constipation: Secondary | ICD-10-CM

## 2020-04-25 DIAGNOSIS — E46 Unspecified protein-calorie malnutrition: Secondary | ICD-10-CM

## 2020-04-25 LAB — CBC WITH DIFFERENTIAL/PLATELET
Abs Immature Granulocytes: 0.1 10*3/uL — ABNORMAL HIGH (ref 0.00–0.07)
Basophils Absolute: 0 10*3/uL (ref 0.0–0.1)
Basophils Relative: 1 %
Eosinophils Absolute: 0.3 10*3/uL (ref 0.0–0.5)
Eosinophils Relative: 6 %
HCT: 27.9 % — ABNORMAL LOW (ref 36.0–46.0)
Hemoglobin: 9.3 g/dL — ABNORMAL LOW (ref 12.0–15.0)
Immature Granulocytes: 2 %
Lymphocytes Relative: 25 %
Lymphs Abs: 1.5 10*3/uL (ref 0.7–4.0)
MCH: 31.6 pg (ref 26.0–34.0)
MCHC: 33.3 g/dL (ref 30.0–36.0)
MCV: 94.9 fL (ref 80.0–100.0)
Monocytes Absolute: 0.7 10*3/uL (ref 0.1–1.0)
Monocytes Relative: 12 %
Neutro Abs: 3.2 10*3/uL (ref 1.7–7.7)
Neutrophils Relative %: 54 %
Platelets: 582 10*3/uL — ABNORMAL HIGH (ref 150–400)
RBC: 2.94 MIL/uL — ABNORMAL LOW (ref 3.87–5.11)
RDW: 13.7 % (ref 11.5–15.5)
WBC: 5.8 10*3/uL (ref 4.0–10.5)
nRBC: 0 % (ref 0.0–0.2)

## 2020-04-25 LAB — COMPREHENSIVE METABOLIC PANEL
ALT: 32 U/L (ref 0–44)
AST: 28 U/L (ref 15–41)
Albumin: 2.8 g/dL — ABNORMAL LOW (ref 3.5–5.0)
Alkaline Phosphatase: 98 U/L (ref 38–126)
Anion gap: 11 (ref 5–15)
BUN: 19 mg/dL (ref 6–20)
CO2: 25 mmol/L (ref 22–32)
Calcium: 9 mg/dL (ref 8.9–10.3)
Chloride: 101 mmol/L (ref 98–111)
Creatinine, Ser: 0.78 mg/dL (ref 0.44–1.00)
GFR, Estimated: >60 mL/min (ref >60)
Glucose, Bld: 95 mg/dL (ref 70–99)
Potassium: 3.9 mmol/L (ref 3.5–5.1)
Sodium: 137 mmol/L (ref 135–145)
Total Bilirubin: 0.5 mg/dL (ref 0.3–1.2)
Total Protein: 6.5 g/dL (ref 6.5–8.1)

## 2020-04-25 MED ORDER — PROSOURCE PLUS PO LIQD
30.0000 mL | Freq: Two times a day (BID) | ORAL | Status: DC
  Administered 2020-04-25 – 2020-05-10 (×32): 30 mL via ORAL
  Filled 2020-04-25 (×32): qty 30
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
## 2020-04-25 NOTE — Progress Notes (Signed)
Pt expressed concerned about tramadol causing her to have headaches. Pt requested to skip the 0600 dose of tramadol. Provider will be made aware during morning rounds.

## 2020-04-25 NOTE — Progress Notes (Signed)
Physical Therapy Session Note  Patient Details  Name: Marissa Smith MRN: [id number] Date of Birth: 05-Feb-1986  Today's Date: 04/25/2020 PT Individual Time: 1415-1550 PT Individual Time Calculation (min): 95 min   Short Term Goals: Week 1:  PT Short Term Goal 1 (Week 1): pt will perform bed mobility with supervision PT Short Term Goal 2 (Week 1): pt will transfer bed<>chair with LRAD and CGA PT Short Term Goal 3 (Week 1): pt will transfer sit<>stand with LRAD and min A  Skilled Therapeutic Interventions/Progress Updates:    Patient received supine in bed, agreeable to PT. She reports 8/10 pain, primarily in R ankle, premedicated. PT providing rest breaks, distractions, repositioning to assist with pain management. She was able to come sit edge of bed with CGA and PT donned CAM boot with MaxA. Patient transferring to wc via slideboard with MinA. PT propelling patient in wc to therapy gym for time management and energy conservation. PT noting that CAM boot strap makes direct contact to anterior surface of patients ankle and that square pad is missing. PT constructing a pad to optimize patient comfort and protect skin in this area. PT lengthening R foot rest for greater comfort in chair as well. She was able to transfer into sedan-height car with MinA via slideboard. Patient requesting to review xrays- PT orienting patient to anatomy and location of injuries and functional impact of injuries. Patient without further questions at this time. Upon returning to room, patient requesting to use bathroom. She was able to transfer to toilet via slideboard with MinA and doff pants/underwear with lateral leans and ModA. ModI for perihygiene seated. MaxA to don pants and MinA to transfer back to wc. Patient transferring back to bed via slideboard with MinA and returning to supine with CGA for L LE management. Bed alarm on, call light within reach, mom at bedside.   Therapy Documentation Precautions:   Precautions Precautions: Fall Required Braces or Orthoses: Other Brace Other Brace: R CAM boot, L sling for comfort. Restrictions Weight Bearing Restrictions: Yes RUE Weight Bearing: Weight bearing as tolerated LUE Weight Bearing: Non weight bearing RLE Weight Bearing: Weight bearing as tolerated (transfers only) LLE Weight Bearing: Non weight bearing Other Position/Activity Restrictions: RLE WBAT transfers only    Therapy/Group: Individual Therapy  Elizebeth Koller, PT, DPT, CBIS  04/25/2020, 7:53 AM

## 2020-04-25 NOTE — Progress Notes (Signed)
Inpatient Rehabilitation  Patient information reviewed and entered into eRehab system by Emaline Karnes M. Sister Carbone, M.A., CCC/SLP, PPS Coordinator.  Information including medical coding, functional ability and quality indicators will be reviewed and updated through discharge.    
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
## 2020-04-26 ENCOUNTER — Inpatient Hospital Stay (HOSPITAL_COMMUNITY): Payer: Medicaid Other

## 2020-04-26 ENCOUNTER — Inpatient Hospital Stay (HOSPITAL_COMMUNITY): Payer: Medicaid Other | Admitting: Occupational Therapy

## 2020-04-26 MED ORDER — OXYCODONE HCL ER 10 MG PO T12A
10.0000 mg | EXTENDED_RELEASE_TABLET | Freq: Two times a day (BID) | ORAL | Status: DC
  Administered 2020-04-26 – 2020-04-29 (×7): 10 mg via ORAL
  Filled 2020-04-26 (×7): qty 1

## 2020-04-26 NOTE — Progress Notes (Signed)
Physical Therapy Session Note  Patient Details  Name: Marissa Smith MRN: [id number] Date of Birth: Mar 10, 1986  Today's Date: 04/26/2020 PT Individual Time: 0800-0900 PT Individual Time Calculation (min): 60 min   Short Term Goals: Week 1:  PT Short Term Goal 1 (Week 1): pt will perform bed mobility with supervision PT Short Term Goal 2 (Week 1): pt will transfer bed<>chair with LRAD and CGA PT Short Term Goal 3 (Week 1): pt will transfer sit<>stand with LRAD and min A  Skilled Therapeutic Interventions/Progress Updates:    Session 1 :Patient received sitting up in bed, agreeable to PT. She reports 9-10/10 pain in L arm, L hip and R ribs. RN present and aware, but Rx not available yet. PT providing rest breaks, repositioning and distractions to assist with pain management. She was able to don pants in bed with MinA and come to sit edge of bed with supervision and verbal cues to maintain NWB to L UE.PT providing patient with low profile ROHO cushion to assist with L hip pain management. Patient transferring to wc via slideboard with set up assist + supervision. PT propelling patient in wc to therapy gym for time management and energy conservation. Patient noting L plantar surface N/T. PT applying kinesiotape in pattern to potentially help alleviate some of this pain. Patient reported noted improvement in pain from this. Patient becoming very tearful due to pain and frustration given her circumstances. Requesting to return to room. Okay to remain up in wc, chair alarm on, call light within reach. PT providing bags of ice to assist with pain management as well.   Session 2: Patient received asleep in bed, initially difficult to rouse. She reports "significant" pain in L hip primarily. Patient not agreeable to out of bed therapy and requests to not reposition due to "just getting comfortable." PT providing patient with ice and education on proper L LE alignment when supine in bed for greatest comfort and  positioning. Patient remaining in bed, bed alarm on, call light within reach.   Therapy Documentation Precautions:  Precautions Precautions: Fall Required Braces or Orthoses: Other Brace Other Brace: R CAM boot, L sling for comfort. Restrictions Weight Bearing Restrictions: Yes RUE Weight Bearing: Weight bearing as tolerated LUE Weight Bearing: Non weight bearing RLE Weight Bearing: Weight bearing as tolerated LLE Weight Bearing: Non weight bearing Other Position/Activity Restrictions: RLE WBAT transfers only    Therapy/Group: Individual Therapy  Elizebeth Koller, PT, DPT, CBIS  04/26/2020, 7:33 AM

## 2020-04-26 NOTE — Progress Notes (Signed)
Occupational Therapy Session Note  Patient Details  Name: Marissa Smith MRN: [id number] Date of Birth: [birth year]  Today's Date: 04/26/2020 OT Individual Time: 1030-1130 OT Individual Time Calculation (min): 60 min    Short Term Goals: Week 1:  OT Short Term Goal 1 (Week 1): Pt will be able to wash UB with S. OT Short Term Goal 2 (Week 1): Pt will be able to wash LB with min A. OT Short Term Goal 3 (Week 1): Pt will be able to complete toilet transfer with min A.  Skilled Therapeutic Interventions/Progress Updates:    Pt received semi-reclined in bed reporting in excruciating pain but agreeable to therapy session.  Pt provided therapist with recap of circumstances overnight and increased pain today.  Pt reports discussion with MD regarding pain management and understanding of increased pain with increased activity.  Pt completed bed mobility with supervision to come to sitting EOB.  Therapist educated on placement and positioning of slide board with pt able to place with min assist.  Pt completed slide board transfer with min assist due to increased pain and up small incline.  Pt set up and sink to complete oral care and grooming tasks.  Pt reports feeling more refreshed afterwards.  Therapist fixed hair to get it out of her face.  Pt reports persistent pain and "cramping" sensation in calf.  Pt requested to return to bed.  Completed transfer to L with therapist assisting with setup of slide board due to decreased use of LUE and then therapist providing CGA during transfer to L.  Pt required assistance to lift LLE in to bed and to doff CAM boot for comfort.  Therapist applied ice to L hip and R ankle per pt request.  Pt remained semi-reclined with all needs in reach.  Therapy Documentation Precautions:  Precautions Precautions: Fall Required Braces or Orthoses: Other Brace Other Brace: R CAM boot, L sling for comfort. Restrictions Weight Bearing Restrictions: Yes RUE Weight Bearing: Weight  bearing as tolerated LUE Weight Bearing: Non weight bearing RLE Weight Bearing: Weight bearing as tolerated LLE Weight Bearing: Non weight bearing Other Position/Activity Restrictions: RLE WBAT transfers only Pain: Pain Assessment Pain Scale: 0-10 Pain Score: 10-Worst pain ever Pain Type: Surgical pain Pain Location: Leg Pain Orientation: Left Pain Descriptors / Indicators: Cramping Pain Frequency: Constant Pain Onset: On-going Patients Stated Pain Goal: 2 Pain Intervention(s): Medication (See eMAR) Multiple Pain Sites: No   Therapy/Group: Individual Therapy  Rosalio Loud 04/26/2020, 12:32 PM

## 2020-04-26 NOTE — Plan of Care (Signed)
  Problem: Consults Goal: Skin Care Protocol Initiated - if Braden Score 18 or less Description: If consults are not indicated, leave blank or document N/A Outcome: Progressing   Problem: RH BOWEL ELIMINATION Goal: RH STG MANAGE BOWEL WITH ASSISTANCE Description: STG Manage Bowel with mod I Assistance. Outcome: Progressing   Problem: RH BLADDER ELIMINATION Goal: RH STG MANAGE BLADDER WITH ASSISTANCE Description: STG Manage Bladder With mod I Assistance Outcome: Progressing Goal: RH STG MANAGE BLADDER WITH MEDICATION WITH ASSISTANCE Description: STG Manage Bladder With Medication With mod I Assistance. Outcome: Progressing   Problem: RH SKIN INTEGRITY Goal: RH STG SKIN FREE OF INFECTION/BREAKDOWN Outcome: Progressing Goal: RH STG ABLE TO PERFORM INCISION/WOUND CARE W/ASSISTANCE Description: STG Able To Perform Incision/Wound Care With mod I Assistance. Outcome: Progressing   Problem: RH SAFETY Goal: RH STG ADHERE TO SAFETY PRECAUTIONS W/ASSISTANCE/DEVICE Description: STG Adhere to Safety Precautions With Assistance/Device. Outcome: Progressing   Problem: RH PAIN MANAGEMENT Goal: RH STG PAIN MANAGED AT OR BELOW PT'S PAIN GOAL Description: Pain scale <4/10 Outcome: Progressing   Problem: RH KNOWLEDGE DEFICIT GENERAL Goal: RH STG INCREASE KNOWLEDGE OF SELF CARE AFTER HOSPITALIZATION Description: Patient will acknowledge measures for safety at home prior to discharge Outcome: Progressing
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
## 2020-04-27 ENCOUNTER — Inpatient Hospital Stay (HOSPITAL_COMMUNITY): Payer: Medicaid Other | Admitting: Occupational Therapy

## 2020-04-27 ENCOUNTER — Inpatient Hospital Stay (HOSPITAL_COMMUNITY): Payer: Medicaid Other

## 2020-04-27 MED ORDER — PREGABALIN 50 MG PO CAPS
200.0000 mg | ORAL_CAPSULE | Freq: Two times a day (BID) | ORAL | Status: DC
  Administered 2020-04-27 – 2020-05-03 (×12): 200 mg via ORAL
  Filled 2020-04-27 (×3): qty 2
  Filled 2020-04-27: qty 1
  Filled 2020-04-27 (×5): qty 2
  Filled 2020-04-27 (×2): qty 1
  Filled 2020-04-27: qty 2
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
## 2020-04-27 NOTE — Plan of Care (Signed)
  Problem: Consults Goal: Skin Care Protocol Initiated - if Braden Score 18 or less Description: If consults are not indicated, leave blank or document N/A Outcome: Progressing   Problem: RH BOWEL ELIMINATION Goal: RH STG MANAGE BOWEL WITH ASSISTANCE Description: STG Manage Bowel with mod I Assistance. Outcome: Progressing   Problem: RH BLADDER ELIMINATION Goal: RH STG MANAGE BLADDER WITH ASSISTANCE Description: STG Manage Bladder With mod I Assistance Outcome: Progressing Goal: RH STG MANAGE BLADDER WITH MEDICATION WITH ASSISTANCE Description: STG Manage Bladder With Medication With mod I Assistance. Outcome: Progressing   Problem: RH SKIN INTEGRITY Goal: RH STG SKIN FREE OF INFECTION/BREAKDOWN Outcome: Progressing Goal: RH STG ABLE TO PERFORM INCISION/WOUND CARE W/ASSISTANCE Description: STG Able To Perform Incision/Wound Care With mod I Assistance. Outcome: Progressing   Problem: RH SAFETY Goal: RH STG ADHERE TO SAFETY PRECAUTIONS W/ASSISTANCE/DEVICE Description: STG Adhere to Safety Precautions With Assistance/Device. Outcome: Progressing   Problem: RH PAIN MANAGEMENT Goal: RH STG PAIN MANAGED AT OR BELOW PT'S PAIN GOAL Description: Pain scale <4/10 Outcome: Progressing   Problem: RH KNOWLEDGE DEFICIT GENERAL Goal: RH STG INCREASE KNOWLEDGE OF SELF CARE AFTER HOSPITALIZATION Description: Patient will acknowledge measures for safety at home prior to discharge Outcome: Progressing   

## 2020-04-27 NOTE — Progress Notes (Addendum)
Physical Therapy Session Note  Patient Details  Name: Marissa Smith MRN: [id number] Date of Birth: 03-Jul-1985  Today's Date: 04/27/2020 PT Individual Time: 0800-0900; 1430-1500 PT Individual Time Calculation (min): 60 min , 30 min  Short Term Goals: Week 1:  PT Short Term Goal 1 (Week 1): pt will perform bed mobility with supervision PT Short Term Goal 2 (Week 1): pt will transfer bed<>chair with LRAD and CGA PT Short Term Goal 3 (Week 1): pt will transfer sit<>stand with LRAD and min A  Skilled Therapeutic Interventions/Progress Updates:  tx 1:  Pt resting in bed.  She rated pain 8/10 R ribs, LLE.    Therapeutic exercises performed with LEs to increase strength for functional mobility: supine- 20 x 1 bil ankle pumps, R quad sets; sitting- 10 x 1 each  R/L hip flexion .    PT donned L sock.  LPN arrived with pain meds.  PT educated pt about ensuring intake when she is in pain.  Pt agreed to eat some breakfast.  With HOB raised, supine> sit with extra time, supervision.  Pt donned R UE sling; PT donned R TED and CAM boot. Pt sat EOB to eat breakfast.   Transfer training via R lateral leans x 5 in order to make more room for placing SB. SBT to R, close supervision, set up and max cues for placing board.  PT replaced ELRs with regular footrests, which pt likes better.  wc propulsion using RUE only, x 50' mod assist for steering.  Pt Simer benefit from on- arm- drive w/c.  At end of session, pt seated in wc with needs at hand.  PT discussed falls risk and pt verbalized understanding and need to call for staff if she needs to use toilet.  tx 2:  Pt resting in bed.  She rated pain LLE 8/10, medicated during session.  Dr. Dalene Carrow in the room to discuss pain meds/muscle relaxers.    Supine> sit with HOB raised, supervision, using leg loop for LLE.  CAM boot already donned RLE.  Slide board transfer to R bed> w/c with mod cues for head/hips relationship,  min assist for board placement.    Core strengthening for transfers: use of peg board and pic for design, requiring trunk flexion/extension to reach pegs and place/remove them on board, x 25 pegs.  Slide board transfer as above to return to bed.  At end of session, pt resting in bed with needs at hand and bed alarm set.     Therapy Documentation Precautions:  Precautions Precautions: Fall Required Braces or Orthoses: Other Brace Other Brace: R CAM boot, L sling for comfort. Restrictions Weight Bearing Restrictions: Yes RUE Weight Bearing: Weight bearing as tolerated LUE Weight Bearing: Non weight bearing RLE Weight Bearing: Weight bearing as tolerated LLE Weight Bearing: Non weight bearing Other Position/Activity Restrictions: RLE WBAT transfers only       Therapy/Group: Individual Therapy  Marissa Smith 04/27/2020, 10:15 AM

## 2020-04-27 NOTE — Progress Notes (Signed)
Occupational Therapy Session Note  Patient Details  Name: Marissa Smith MRN: [id number] Date of Birth: 1985-05-27  Today's Date: 04/27/2020 OT Individual Time: 6283-1517 and 1101-1201 OT Individual Time Calculation (min): 42 min and 60 min  Short Term Goals: Week 1:  OT Short Term Goal 1 (Week 1): Pt will be able to wash UB with S. OT Short Term Goal 2 (Week 1): Pt will be able to wash LB with min A. OT Short Term Goal 3 (Week 1): Pt will be able to complete toilet transfer with min A.  Skilled Therapeutic Interventions/Progress Updates:    Pt greeted in the w/c, c/o significant neuropathic pain in the Lt LE. She reported she already tried the ELR and this did not help, also told RN she wanted to wait until after therapy for pain medicine. OT set her up with a small strap and taught her gentle LE ROM exercises and pt reported that this helped "a lot." Pt requesting strap to be fashioned to her pants and we did this using a small clip. We discussed emotional coping strategies a great deal, OT emphasizing use of holistic methods such as aromatherapy, music listening, and journaling. OT provided her with lavender aromatherapy and also a personal journal during session. Pt appreciative. She completed face washing and UB bathing beneath shirt with setup assistance after w/c was parked in front of the sink. Pt remained at the sink to complete oral care, left with call bell so that she could notify nursing staff when she was finished for toileting needs.   2nd Session 1:1 tx (60 min) Pt greeted in bed and agreeable to tx. Discussed d/c plans and problem solving w/c accessibility given her home layout. Encouraged pt to look into portable foldable ramps to negotiate 3-4 stairs of her grandmother's split level. She also does not want a hospital bed for home due to high cost. We practiced supine<sit from flat bed without bedrails, though we did discuss that she could independently purchase bedrails to fit  under the mattress. Pt able to transition to EOB, pushing up through the Rt arm with a pillow pressed to chest using the Lt arm to manage her rib pain. Pt also used her strap to move the Lt LE. Pt very proud that she could complete supine<sit with supervision assist alone while adhering to multiple precautions! We celebrated! Slideboard<w/c going towards her Lt side completed with CGA and vcs once OT placed the board. After OT washed her hair using the hair washing tray, worked on Rt UE strengthening and activity tolerance by combing, blow-drying, and styling hair given Min A. She is interested in purchasing a hair washing tray for home use. Pt remained sitting up for lunch, affect bright, all needs within reach. Tx focus placed on psychosocial health, d/c planning, transfers, and pt education.   Pt was also premedicated for pain   Therapy Documentation Precautions:  Precautions Precautions: Fall Required Braces or Orthoses: Other Brace Other Brace: R CAM boot, L sling for comfort. Restrictions Weight Bearing Restrictions: Yes RUE Weight Bearing: Weight bearing as tolerated LUE Weight Bearing: Non weight bearing RLE Weight Bearing: Weight bearing as tolerated LLE Weight Bearing: Non weight bearing Other Position/Activity Restrictions: RLE WBAT transfers only Pain: Pain Assessment Pain Scale: 0-10 Pain Score: 4  Pain Type: Acute pain Pain Location: Calf Pain Orientation: Left Pain Descriptors / Indicators: Shooting Pain Frequency: Constant Pain Onset: Sudden Patients Stated Pain Goal: 2 Pain Intervention(s): Medication (See eMAR) Multiple Pain Sites: No ADL:  ADL Eating: Set up Grooming: Setup Upper Body Bathing: Minimal assistance Lower Body Bathing: Moderate assistance Upper Body Dressing: Supervision/safety Lower Body Dressing: Moderate assistance Toileting: Moderate assistance Where Assessed-Toileting: Toilet (BSC over toilet) Toilet Transfer: Moderate assistance Toilet  Transfer Method: Squat pivot (small scoots) Toilet Transfer Equipment: Bedside commode,Grab bars      Therapy/Group: Individual Therapy  Vonna Brabson A Arieh Bogue 04/27/2020, 12:45 PM

## 2020-04-28 NOTE — Progress Notes (Signed)
PRN Oxy IR 10mg 's given at 2257 & 0313. PRN valium given at 0603. Patient feels increase in Lyrica has helped. Refused scheduled miralax. 0314 A
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
## 2020-04-28 NOTE — Plan of Care (Signed)
  Problem: Consults Goal: Skin Care Protocol Initiated - if Braden Score 18 or less Description: If consults are not indicated, leave blank or document N/A Outcome: Progressing   Problem: RH BOWEL ELIMINATION Goal: RH STG MANAGE BOWEL WITH ASSISTANCE Description: STG Manage Bowel with mod I Assistance. Outcome: Progressing   Problem: RH BLADDER ELIMINATION Goal: RH STG MANAGE BLADDER WITH ASSISTANCE Description: STG Manage Bladder With mod I Assistance Outcome: Progressing Goal: RH STG MANAGE BLADDER WITH MEDICATION WITH ASSISTANCE Description: STG Manage Bladder With Medication With mod I Assistance. Outcome: Progressing   Problem: RH SKIN INTEGRITY Goal: RH STG SKIN FREE OF INFECTION/BREAKDOWN Outcome: Progressing Goal: RH STG ABLE TO PERFORM INCISION/WOUND CARE W/ASSISTANCE Description: STG Able To Perform Incision/Wound Care With mod I Assistance. Outcome: Progressing   Problem: RH SAFETY Goal: RH STG ADHERE TO SAFETY PRECAUTIONS W/ASSISTANCE/DEVICE Description: STG Adhere to Safety Precautions With Assistance/Device. Outcome: Progressing   Problem: RH PAIN MANAGEMENT Goal: RH STG PAIN MANAGED AT OR BELOW PT'S PAIN GOAL Description: Pain scale <4/10 Outcome: Progressing   Problem: RH KNOWLEDGE DEFICIT GENERAL Goal: RH STG INCREASE KNOWLEDGE OF SELF CARE AFTER HOSPITALIZATION Description: Patient will acknowledge measures for safety at home prior to discharge Outcome: Progressing   

## 2020-04-29 ENCOUNTER — Inpatient Hospital Stay (HOSPITAL_COMMUNITY): Payer: Medicaid Other

## 2020-04-29 ENCOUNTER — Encounter (HOSPITAL_COMMUNITY): Payer: Medicaid Other | Admitting: Psychology

## 2020-04-29 ENCOUNTER — Inpatient Hospital Stay (HOSPITAL_COMMUNITY): Payer: Medicaid Other | Admitting: Occupational Therapy

## 2020-04-29 LAB — CBC WITH DIFFERENTIAL/PLATELET
Abs Immature Granulocytes: 0.03 10*3/uL (ref 0.00–0.07)
Basophils Absolute: 0 10*3/uL (ref 0.0–0.1)
Basophils Relative: 1 %
Eosinophils Absolute: 0.3 10*3/uL (ref 0.0–0.5)
Eosinophils Relative: 6 %
HCT: 31.9 % — ABNORMAL LOW (ref 36.0–46.0)
Hemoglobin: 9.9 g/dL — ABNORMAL LOW (ref 12.0–15.0)
Immature Granulocytes: 1 %
Lymphocytes Relative: 33 %
Lymphs Abs: 1.6 10*3/uL (ref 0.7–4.0)
MCH: 30.2 pg (ref 26.0–34.0)
MCHC: 31 g/dL (ref 30.0–36.0)
MCV: 97.3 fL (ref 80.0–100.0)
Monocytes Absolute: 0.6 10*3/uL (ref 0.1–1.0)
Monocytes Relative: 12 %
Neutro Abs: 2.3 10*3/uL (ref 1.7–7.7)
Neutrophils Relative %: 47 %
Platelets: 555 10*3/uL — ABNORMAL HIGH (ref 150–400)
RBC: 3.28 MIL/uL — ABNORMAL LOW (ref 3.87–5.11)
RDW: 13.9 % (ref 11.5–15.5)
WBC: 4.7 10*3/uL (ref 4.0–10.5)
nRBC: 0 % (ref 0.0–0.2)

## 2020-04-29 LAB — MAGNESIUM: Magnesium: 1.9 mg/dL (ref 1.7–2.4)

## 2020-04-29 MED ORDER — OXYCODONE HCL ER 10 MG PO T12A
20.0000 mg | EXTENDED_RELEASE_TABLET | Freq: Two times a day (BID) | ORAL | Status: DC
  Administered 2020-04-29 – 2020-05-03 (×8): 20 mg via ORAL
  Filled 2020-04-29 (×8): qty 2
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
## 2020-04-29 NOTE — Progress Notes (Signed)
Occupational Therapy Session Note  Patient Details  Name: Marissa Smith MRN: [id number] Date of Birth: June 28, 1985  Today's Date: 04/29/2020 OT Individual Time: 0800-0900 OT Individual Time Calculation (min): 60 min    Short Term Goals: Week 1:  OT Short Term Goal 1 (Week 1): Pt will be able to wash UB with S. OT Short Term Goal 2 (Week 1): Pt will be able to wash LB with min A. OT Short Term Goal 3 (Week 1): Pt will be able to complete toilet transfer with min A.  Skilled Therapeutic Interventions/Progress Updates:    Patient in bed, alert and aware of needs.  She is pleasant and cooperative t/o session.  She is able to maintain WB precautions.  Lower body bathing and dressing completed bed level.  She is able to roll with min A to right, mod I to left.  She requires min A for bathing left lower leg and buttocks.  Mod A for pants over both feet, she is able to pull over hips with min A.  Donning teds and slipper socks max / dep.  Supine to sitting edge of bed with CS.  SB transfer bed to w/c with CGA.  She completed UB bathing and dressing w/c level with set up/supervision.  Grooming/oral care set up level.   She remained seated at sink at close of session, seat alarm set and call bell in reach.    Therapy Documentation Precautions:  Precautions Precautions: Fall Required Braces or Orthoses: Other Brace Other Brace: R CAM boot, L sling for comfort. Restrictions Weight Bearing Restrictions: Yes RUE Weight Bearing: Weight bearing as tolerated LUE Weight Bearing: Non weight bearing RLE Weight Bearing: Weight bearing as tolerated LLE Weight Bearing: Non weight bearing Other Position/Activity Restrictions: RLE WBAT transfers only   Therapy/Group: Individual Therapy  Barrie Lyme 04/29/2020, 7:46 AM
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
## 2020-04-29 NOTE — Plan of Care (Signed)
  Problem: RH Wheelchair Mobility Goal: LTG Patient will propel w/c in controlled environment (PT) Description: LTG: Patient will propel wheelchair in controlled environment, # of feet with assist (PT) Outcome: Not Applicable Flowsheets (Taken 04/29/2020 0749) LTG: Pt will propel w/c in controlled environ  assist needed:: (D/C) -- Note: D/C Goal: LTG Patient will propel w/c in home environment (PT) Description: LTG: Patient will propel wheelchair in home environment, # of feet with assistance (PT). Outcome: Not Applicable Flowsheets (Taken 04/29/2020 0749) LTG: Pt will propel w/c in home environ  assist needed:: (D/C) -- Note: D/C

## 2020-04-29 NOTE — Progress Notes (Signed)
Physical Therapy Session Note  Patient Details  Name: Marissa Smith MRN: [id number] Date of Birth: [birth year]  Today's Date: 04/29/2020 PT Individual Time: 2505-3976 PT Individual Time Calculation (min): 70 min   Short Term Goals: Week 1:  PT Short Term Goal 1 (Week 1): pt will perform bed mobility with supervision PT Short Term Goal 2 (Week 1): pt will transfer bed<>chair with LRAD and CGA PT Short Term Goal 3 (Week 1): pt will transfer sit<>stand with LRAD and min A  Skilled Therapeutic Interventions/Progress Updates:   Received pt supine in bed, pt agreeable to therapy, and reported pain 8/10 in LLE/hip and 7/10 pain in L shoulder. RN notified and present to administer pain medication during session. Session with emphasis on functional mobility/transfers, generalized strengthening, dynamic standing balance/coordination, and improved activity tolerance. R CAM boot donned throughout session. Pt reported urge to use restroom. Pt transferred supine<>sitting EOB with HOB elevated and use of bedrails with supervision. Donned sling with min A and pt transferred bed<>WC via slideboard with CGA with total A to place board. Pt transferred WC<>toilet with bedside commode over top with CGA and total A to place board. Pt able to doff pants via lateral leans with supervision and able to void. Pt performed peri-care with supervision but required min A to pull pants over hips. Pt transferred bedside commode<>WC via slideboard with min A. Pt washed hands sitting in WC at sink with supervision. Pt transported to therapy gym in [facility] total A for energy conservation purposes and transferred sit<>stand in // bars x 3 trials. Trial 1 with max A with therapist's foot placed underneath to maintain weight bearing precautions. Although pt putting ~25% weight through LLE. Returned to sitting and applied gait belt on LLE and hung it over parallel bar. Pt transferred sit<>stand x 2 additional trials with mod A using gait belt to  maintain LLE NWB status. Pt with improvements in adherence to WB precautions and reporting using gait belt was easier. Pt only able to remain standing ~15 seconds prior to sitting. Pt performed the following exercises sitting in WC with supervision and verbal cues for technique: -bicep curls with 5lb dumbbell 2x10 on RUE -overhead shoulder press with 5lb dumbbell 2x10 on RUE -overhead tricep extensions with 5lb dumbbell 2x10 on RUE -hip flexion with 3lb ankle weight on RLE and unweighted on LLE 2x10 bilaterally -LAQ with 3lb ankle weight on RLE and unweighted on LLE 2x10 bilaterally -hip adduction ball squeezes x10 with 3 second isometric hold Pt transported back to room in [facility] total A and transferred WC<>bed via slideboard with close supervision and total A to place board. Pt transferred sit<>supine with supervision using gait belt as leg lifter. Concluded session with pt supine in bed, needs within reach, and bed alarm on. RN present at bedside.   Therapy Documentation Precautions:  Precautions Precautions: Fall Required Braces or Orthoses: Other Brace Other Brace: R CAM boot, L sling for comfort. Restrictions Weight Bearing Restrictions: Yes RUE Weight Bearing: Weight bearing as tolerated LUE Weight Bearing: Non weight bearing RLE Weight Bearing: Weight bearing as tolerated LLE Weight Bearing: Non weight bearing Other Position/Activity Restrictions: RLE WBAT transfers only  Therapy/Group: Individual Therapy Martin Majestic PT, DPT   04/29/2020, 7:28 AM

## 2020-04-30 ENCOUNTER — Inpatient Hospital Stay (HOSPITAL_COMMUNITY): Payer: Medicaid Other

## 2020-04-30 ENCOUNTER — Inpatient Hospital Stay (HOSPITAL_COMMUNITY): Payer: Medicaid Other | Admitting: Physical Therapy

## 2020-04-30 ENCOUNTER — Inpatient Hospital Stay (HOSPITAL_COMMUNITY): Payer: Medicaid Other | Admitting: Occupational Therapy

## 2020-04-30 DIAGNOSIS — M792 Neuralgia and neuritis, unspecified: Secondary | ICD-10-CM

## 2020-04-30 NOTE — Progress Notes (Signed)
Physical Therapy Session Note  Patient Details  Name: Marissa Smith MRN: [id number] Date of Birth: [birth year]  Today's Date: 04/30/2020 PT Individual Time: 4098-1191 and 1500-1526  PT Individual Time Calculation (min): 24 min and 26 min  Short Term Goals: Week 1:  PT Short Term Goal 1 (Week 1): pt will perform bed mobility with supervision PT Short Term Goal 2 (Week 1): pt will transfer bed<>chair with LRAD and CGA PT Short Term Goal 3 (Week 1): pt will transfer sit<>stand with LRAD and min A  Skilled Therapeutic Interventions/Progress Updates:   Treatment Session 1: 1300-1324 24 min Received pt sitting in WC, pt agreeable to therapy, and reported pain 5/10 in L hip/LE (premedicated). Repositioning, rest breaks, and distraction done for pain management. Session with emphasis on functional mobility, generalized strengthening, and improved activity tolerance. Donned L sling with set up assist and pt transported to dayroom in [facility] total A and therapist measured pt for 16x16 manual WC. Pt performed seated hip flexion with 3lb ankle weight on RLE and unweighted on LLE 2x12 bilaterally. Pt extremely verbose this afternoon and required increased time with mobility. Pt transported back to room in [facility] total A. Concluded session with pt sitting in WC, needs within reach, and chair pad alarm on.   Treatment Session 2: 1500-1526 26 min Received pt supine in bed, pt agreeable to therapy, and did not state pain level during session. Pt continues to remain verbose and reported practicing pivoting during previous PT session and was feeing sore; however pt agreeable to bed level exercises. Session with emphasis on generalized strengthening and improved activity tolerance. Pt performed the following exercises supine in bed with supervision and verbal cues for technique: -SLR 2x10 bilaterally (AAROM on LLE) -hip adduction towel squeezes 2x12 -ankle circles x20 bilaterally clockwise/counterclockwise -SAQ 2x10  bilaterally (pt reported this exercise felt good on LLE) -glute squeezes 2x12 with 5 second hold Concluded session with pt supine in bed, needs within reach, and bed alarm on.  Therapy Documentation Precautions:  Precautions Precautions: Fall Required Braces or Orthoses: Other Brace Other Brace: R CAM boot, L sling for comfort. Restrictions Weight Bearing Restrictions: Yes RUE Weight Bearing: Weight bearing as tolerated LUE Weight Bearing: Non weight bearing RLE Weight Bearing: Weight bearing as tolerated LLE Weight Bearing: Non weight bearing Other Position/Activity Restrictions: RLE WBAT transfers only  Therapy/Group: Individual Therapy Martin Majestic PT, DPT   04/30/2020, 7:14 AM
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
## 2020-04-30 NOTE — Progress Notes (Signed)
Occupational Therapy Session Note  Patient Details  Name: Marissa Smith MRN: [id number] Date of Birth: 12/10/1985  Today's Date: 04/30/2020 OT Individual Time: 1130-1210 OT Individual Time Calculation (min): 40 min    Short Term Goals: Week 1:  OT Short Term Goal 1 (Week 1): Pt will be able to wash UB with S. OT Short Term Goal 2 (Week 1): Pt will be able to wash LB with min A. OT Short Term Goal 3 (Week 1): Pt will be able to complete toilet transfer with min A.  Skilled Therapeutic Interventions/Progress Updates:    Patient in bed, she notes getting into bed within the past half hour due to pain.  Nursing provided pain meds during this session.  Reviewed left arm fracture location and completed scapular/shoulder AROM - reviewed postioning for edema management.  Provided stress ball and reviewed and completed hand ROM.  Light mobility for elbow and forearm - she has good carryover of NWB.  She remained in bed, call bell in reach and alarm set.    Therapy Documentation Precautions:  Precautions Precautions: Fall Required Braces or Orthoses: Other Brace Other Brace: R CAM boot, L sling for comfort. Restrictions Weight Bearing Restrictions: Yes RUE Weight Bearing: Weight bearing as tolerated LUE Weight Bearing: Non weight bearing RLE Weight Bearing: Weight bearing as tolerated LLE Weight Bearing: Non weight bearing Other Position/Activity Restrictions: RLE WBAT transfers only   Therapy/Group: Individual Therapy  Barrie Lyme 04/30/2020, 7:39 AM
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
## 2020-04-30 NOTE — Progress Notes (Addendum)
Physical Therapy Session Note  Patient Details  Name: Marissa Smith MRN: [id number] Date of Birth: Jun 26, 1985  Today's Date: 04/30/2020 PT Individual Time: 1400-1458 PT Individual Time Calculation (min): 58 min   Short Term Goals: Week 1:  PT Short Term Goal 1 (Week 1): pt will perform bed mobility with supervision PT Short Term Goal 2 (Week 1): pt will transfer bed<>chair with LRAD and CGA PT Short Term Goal 3 (Week 1): pt will transfer sit<>stand with LRAD and min A  Skilled Therapeutic Interventions/Progress Updates:    Patient seated in w/c able to relate focus of previous PT session.  Donned L/S shirt and R UE sling seated with S.  Camboot already donned on R LE.  Pushed in w/c to dayroom.  Patient educated and PT demonstrated squat pivot transfers.  Patient performed with min A x 4 reps struggling going towards R side needing further demonstration and assist to hold L LE for NWB.  Seated at table for L UE therex to including forward flexion table top slides, pron/sup AROM, elbow flex/ext AROM, shoulder horizontal abduction/adduction.  Patient propelled w/c x 93' with S using R LE/UE.  Patient performed slide board transfer back to bed with min A for board placement and holding board.  Sit to supine with S using belt to lift L LE.  Doffed camboot and left pt in supine with needs in reach and bed alarm activated.   Therapy Documentation Precautions:  Precautions Precautions: Fall Required Braces or Orthoses: Other Brace Other Brace: R CAM boot, L sling for comfort. Restrictions Weight Bearing Restrictions: Yes RUE Weight Bearing: Weight bearing as tolerated LUE Weight Bearing: Non weight bearing RLE Weight Bearing: Weight bearing as tolerated LLE Weight Bearing: Non weight bearing Other Position/Activity Restrictions: RLE WBAT transfers only Pain: Pain Assessment Pain Scale: 0-10 Pain Score: 7  Pain Type: Acute pain Pain Location: Hip Pain Orientation: Left Pain Descriptors /  Indicators: Aching;Burning Pain Onset: On-going Pain Intervention(s): Repositioned;Rest   Therapy/Group: Individual Therapy  Elray Mcgregor  [place], [place] 04/30/2020, 3:01 PM

## 2020-04-30 NOTE — Progress Notes (Signed)
Pt explained that she did not get any relief from pain medication. Pt describes the pain as throbbing not relieved by medicine, elevation, or heat. RN offered valium around 2am. Pt opt for oxycodone. RN will give oxy when time. RN explained to pt to let MD know about the different pain. Upon assessment, left leg +2 pulse, warm, pt able to move leg. RN will continue to monitor.

## 2020-05-01 ENCOUNTER — Inpatient Hospital Stay (HOSPITAL_COMMUNITY): Payer: Medicaid Other | Admitting: Occupational Therapy

## 2020-05-01 ENCOUNTER — Inpatient Hospital Stay (HOSPITAL_COMMUNITY): Payer: Medicaid Other

## 2020-05-01 LAB — CREATININE, SERUM
Creatinine, Ser: 0.66 mg/dL (ref 0.44–1.00)
GFR, Estimated: >60 mL/min (ref >60)

## 2020-05-01 MED ORDER — LIDOCAINE 5 % EX PTCH
2.0000 | MEDICATED_PATCH | CUTANEOUS | Status: DC
  Administered 2020-05-02 – 2020-05-08 (×7): 2 via TRANSDERMAL
  Filled 2020-05-01 (×7): qty 2

## 2020-05-01 NOTE — Progress Notes (Signed)
Orthopaedic Trauma Progress Note  SUBJECTIVE: Doing well with therapies, having a good experience in CIR. Continues to have some stabbing pains in the bottom of her left foot, Lyrica was increased so hopefully this will help. Moving her left foot and ankle better. Elbow motion improving. Is unsure how long she will be in CIR, team meeting scheduled for today.  OBJECTIVE:  General: Sitting up in wheelchair, no acute distress Respiratory: No increased work of breathing.  LLE: Incision clean, dry, intact.  Mild tenderness over hip, otherwise no significant tenderness throughout extremity. Sensation improving over plantar aspect of foot, endorses pins and needle sensation.  Sensation intact over dorsal aspect.+ EHL,  FHL weak but improving. Active ankle dorsiflexion intact but still weak. Compartments soft and compressible. +DP pulse LUE: Incisions are clean, dry, intact with Steri-Strips in place.  Able to wiggle fingers.  Near full extension of elbow. Flexion about 90 degrees. Compartments soft and compressible.  Skin warm and dry.  Fingers well-perfused RLE: CAM boot in place. Endorses sensation to light touch over all aspects of foot.  Able to wiggle toes.  Foot warm and dry, toes well-perfused  IMAGING: Stable post op imaging.   ASSESSMENT: Marissa Smith is a 35 y.o. female, 16 days post-op Injuries: 1. Left posterior column/posterior wall acetabular fracture dislocation with sciatic nerve palsy s/p ORIF with sciatic neuroplasty 2. Left open proximal ulnar fracture s/p I&D and ORIF 3. Left ulna shaft fracture s/p ORIF 4. Right calcaneus fracture s/p non-op treatment  PLAN: Weightbearing: NWB LUE and LLE. Ok for unrestricted ROM. WB for transfers only RLE Incisional and dressing care: Okay to leave incisions open to air  Showering: Okay to shower with assistance, Steri-Strips on left upper and lower extremities Marissa Smith get wet. Orthopedic device(s): CAM boot RLE when weightbearing Pain  management: Continue current regimen VTE prophylaxis: Lovenox, SCDs ID:  Ancef 2gm post op completed Foley/Lines: No Foley.  KVO IVFs Impediments to Fracture Healing: Polytrauma.  Vitamin D level 17, continue supplementation Dispo: Continue with therapies. Plan for repeat x-rays later this week. Follow - up plan: Will continue to follow while in hospital. Schedule outpatient f/u 2 weeks after discharge  Contact information:  Marissa Merle MD, Marissa Southward PA-C. After hours and holidays please check [email or web address] for group call information for [facility]   Marissa Tankard A. Michaelyn Barter, PA-C [phone] (office) [email or web address]

## 2020-05-01 NOTE — Progress Notes (Signed)
Occupational Therapy Session Note  Patient Details  Name: Marissa Smith MRN: [id number] Date of Birth: Oct 07, 1985  Today's Date: 05/01/2020 OT Individual Time: 2947-6546 OT Individual Time Calculation (min): 65 min    Short Term Goals: Week 1:  OT Short Term Goal 1 (Week 1): Pt will be able to wash UB with S. OT Short Term Goal 2 (Week 1): Pt will be able to wash LB with min A. OT Short Term Goal 3 (Week 1): Pt will be able to complete toilet transfer with min A.  Skilled Therapeutic Interventions/Progress Updates:    Treatment session with focus on self-care retraining and functional transfers.  Pt received upright in w/c reporting pain but agreeable to shower this session. Pt reports need to toilet before shower.  Completed squat pivot transfer min assist w/c > drop arm BSC over toilet.  Pt able to complete clothing management and hygiene with lateral leans.  Squat pivot transfer BSC to w/c to tub bench in room shower with Min assist.  Pt completed all bathing with setup assist and lateral leans to wash buttocks, therapist assisting to wash LLE.  Pt donned CAM boot prior to transfer back out of shower.  Min assist lateral scoot to w/c.  Pt engaged in dressing seated at sink in w/c.  Setup for UB dressing and min assist for LB dressing.  Pt completed lateral scoot min assist back to bed and then with lateral leans on EOB pt able to pull pants fully over hips.  Pt reports feeling better after shower, but now fatigued and hoping for nap before PM therapy sessions.  RN notified to apply lidocane patch post shower.  Therapy Documentation Precautions:  Precautions Precautions: Fall Required Braces or Orthoses: Other Brace Other Brace: R CAM boot, L sling for comfort. Restrictions Weight Bearing Restrictions: Yes RUE Weight Bearing: Weight bearing as tolerated LUE Weight Bearing: Non weight bearing RLE Weight Bearing: Weight bearing as tolerated LLE Weight Bearing: Non weight bearing Other  Position/Activity Restrictions: RLE WBAT transfers only Pain: Pain Assessment Pain Scale: 0-10 Pain Score: 8  Pain Type: Acute pain Pain Location: Hip Pain Orientation: Left   Therapy/Group: Individual Therapy  Rosalio Loud 05/01/2020, 12:28 PM

## 2020-05-01 NOTE — Progress Notes (Signed)
Pt still in a lot of pain even with having pain meds q4hr. RN explained to pt that due to therapy it will cause soreness. No changes in assessment. RN will continue to monitor.

## 2020-05-01 NOTE — Progress Notes (Signed)
Occupational Therapy Session Note  Patient Details  Name: Marissa Smith MRN: [id number] Date of Birth: [birth year]  Today's Date: 05/01/2020 OT Individual Time: 1430-1500 OT Individual Time Calculation (min): 30 min    Short Term Goals: Week 1:  OT Short Term Goal 1 (Week 1): Pt will be able to wash UB with S. OT Short Term Goal 2 (Week 1): Pt will be able to wash LB with min A. OT Short Term Goal 3 (Week 1): Pt will be able to complete toilet transfer with min A.  Skilled Therapeutic Interventions/Progress Updates:    Treatment session with focus on d/c planning and gentle ROM to LUE.  Pt received semi-reclined in bed with TR present discussing TR services.  Discussed DME at home as well as recommended DME as pt will require drop arm BSC due to inability to stand and pivot.  Engaged in gentle ROM to LUE with focus on shoulder and elbow mobility.  Therapist directed pt in forward and backward shoulder rolls as well as horizontal abduction/adduction with elbow bent to further facilitate increased mobility and range as needed for self-care tasks when allowed to bare weight.  Engaged in internal and external rotation and elbow flexion, again discussing functional carryover of each movement.  Pt reports minimal discomfort with mobility, reporting mostly just "stiff".    Therapy Documentation Precautions:  Precautions Precautions: Fall Required Braces or Orthoses: Other Brace Other Brace: R CAM boot, L sling for comfort. Restrictions Weight Bearing Restrictions: Yes RUE Weight Bearing: Weight bearing as tolerated LUE Weight Bearing: Non weight bearing RLE Weight Bearing: Weight bearing as tolerated LLE Weight Bearing: Non weight bearing Other Position/Activity Restrictions: RLE WBAT transfers only General:   Vital Signs: Therapy Vitals Temp: 98.3 F (36.8 C) Pulse Rate: 80 Resp: 18 BP: (!) 105/50 Patient Position (if appropriate): Lying Oxygen Therapy SpO2: 100 % O2 Device: Room  Air Pain:  Pt with c/o pain in L elbow and BLE. Premedicated   Therapy/Group: Individual Therapy  Rosalio Loud 05/01/2020, 3:12 PM
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
## 2020-05-01 NOTE — Progress Notes (Signed)
Physical Therapy Session Note  Patient Details  Name: Marissa Smith MRN: [id number] Date of Birth: March 05, 1986  Today's Date: 05/01/2020 PT Individual Time: 0800-0909 and 1300-1327 PT Individual Time Calculation (min): 69 min and 27 min  Short Term Goals: Week 1:  PT Short Term Goal 1 (Week 1): pt will perform bed mobility with supervision PT Short Term Goal 2 (Week 1): pt will transfer bed<>chair with LRAD and CGA PT Short Term Goal 3 (Week 1): pt will transfer sit<>stand with LRAD and min A  Skilled Therapeutic Interventions/Progress Updates:   Treatment Session 1: 0800-0909 69 min Received pt semi-reclined in bed, pt agreeable to therapy, and reported pain 7/10 in L hip/LE (premedicated). Repositioning, rest breaks, and distraction done to reduce pain levels. Pt continues to remain verbose and requires increased time with all mobility. Session with emphasis on functional mobility/transfers, generalized strengthening, dynamic standing balance/coordination, and improved activity tolerance. Pt transferred supine<>sitting EOB with supervision with HOB elevated and donned R ted hose and L non-skid sock total A. MD present for morning rounds. Donned LUE sling with supervision and CAM boot with supervision. Pt transferred bed<>WC squat<>pivot with CGA and cues for technique and hand placement. Pt brushed teeth, washed face, and applied deodorant sitting in WC with supervision. Pt transported to dayroom in [facility] total A and worked on blocked practice squat<>pivot transfers x 4 reps. Pt initially placing 25% weight on LLE during first transfer but with remaining transfers able to maintain LLE NWB status. Pt able to transfer to L easier than to R and prefers to push up on R first for leverage. Pt was hesitant and over-thinking transfers and will require more practice to improve confidence; however pt was able to transfer with as little as CGA. Pt required cues for anterior weight shifting, LLE foot placement,  and hand placement when pivoting. Pt performed the following exercises sitting in [facility] with supervision and verbal cues for technique: -pronation/supination with elbow extended with 3lb dumbbell 2x10 each direction -bicep curls 2x12 with 7lb dumbbell -hammer curls 2x12 with 7lb dumbbell Pt transported back to room in [facility] total A. Concluded session with pt sitting in WC, needs within reach, and chair pad alarm on.   Treatment Session 2: 1300-1327 27 min  Received pt supine in bed eating lunch, pt agreeable to therapy, and reported recently receiving pain medications and did not state pain level. Session with emphasis on generalized strengthening and improved activity tolerance. Pt performed the following exercises supine in bed with supervision and verbal cues for technique and emphasis on eccentric control: -ankle circles x 20 clockwise/counterclockwise bilaterally  -DF with blue TB 2x12 -hip abduction with blue TB 2x15 -SAQ 2x15 on LLE -SLR 2x15 on RLE Concluded session with pt supine in bed, needs within reach, and NT present at bedside assessing vitals.   Therapy Documentation Precautions:  Precautions Precautions: Fall Required Braces or Orthoses: Other Brace Other Brace: R CAM boot, L sling for comfort. Restrictions Weight Bearing Restrictions: Yes RUE Weight Bearing: Weight bearing as tolerated LUE Weight Bearing: Non weight bearing RLE Weight Bearing: Weight bearing as tolerated LLE Weight Bearing: Non weight bearing Other Position/Activity Restrictions: RLE WBAT transfers only  Therapy/Group: Individual Therapy Martin Majestic PT, DPT   05/01/2020, 7:20 AM
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
## 2020-05-02 ENCOUNTER — Inpatient Hospital Stay (HOSPITAL_COMMUNITY): Payer: Medicaid Other | Admitting: Physical Therapy

## 2020-05-02 ENCOUNTER — Inpatient Hospital Stay (HOSPITAL_COMMUNITY): Payer: Medicaid Other | Admitting: *Deleted

## 2020-05-02 ENCOUNTER — Inpatient Hospital Stay (HOSPITAL_COMMUNITY): Payer: Medicaid Other

## 2020-05-02 ENCOUNTER — Inpatient Hospital Stay (HOSPITAL_COMMUNITY): Payer: Medicaid Other | Admitting: Occupational Therapy

## 2020-05-02 DIAGNOSIS — T148XXA Other injury of unspecified body region, initial encounter: Secondary | ICD-10-CM

## 2020-05-02 LAB — BASIC METABOLIC PANEL
Anion gap: 11 (ref 5–15)
BUN: 16 mg/dL (ref 6–20)
CO2: 27 mmol/L (ref 22–32)
Calcium: 8.9 mg/dL (ref 8.9–10.3)
Chloride: 102 mmol/L (ref 98–111)
Creatinine, Ser: 0.61 mg/dL (ref 0.44–1.00)
GFR, Estimated: >60 mL/min (ref >60)
Glucose, Bld: 67 mg/dL — ABNORMAL LOW (ref 70–99)
Potassium: 3.8 mmol/L (ref 3.5–5.1)
Sodium: 140 mmol/L (ref 135–145)

## 2020-05-02 NOTE — Progress Notes (Signed)
Physical Therapy Weekly Progress Note  Patient Details  Name: Marissa Smith MRN: [id number] Date of Birth: 10/18/1985  Beginning of progress report period: April 25, 2020 End of progress report period: May 02, 2020  Today's Date: 05/02/2020 PT Individual Time: 1300-1409 PT Individual Time Calculation (min): 69 min   Patient has met 3 of 3 short term goals. Pt demonstrates steady progress towards long term goals. Pt is currently able to perform bed mobility with supervision, squat<>pivot and slideboard transfers with CGA, and transfer sit<>stand inside parallel bars with min A. Pt is able to don R CAM boot and L sling with supervision/set up assist and demonstrates good adherence to LUE/LE and RLE weight bearing precautions. Pt continues to be limited by pain, fear of falling, and generalized weakness.   Patient continues to demonstrate the following deficits muscle weakness and muscle joint tightness and decreased standing balance, decreased postural control, decreased balance strategies and difficulty maintaining precautions and therefore will continue to benefit from skilled PT intervention to increase functional independence with mobility.  Patient progressing toward long term goals..  Continue plan of care.  PT Short Term Goals Week 1:  PT Short Term Goal 1 (Week 1): pt will perform bed mobility with supervision PT Short Term Goal 1 - Progress (Week 1): Met PT Short Term Goal 2 (Week 1): pt will transfer bed<>chair with LRAD and CGA PT Short Term Goal 2 - Progress (Week 1): Met PT Short Term Goal 3 (Week 1): pt will transfer sit<>stand with LRAD and min A PT Short Term Goal 3 - Progress (Week 1): Met Week 2:  PT Short Term Goal 1 (Week 2): STG=LTG due to LOS  Skilled Therapeutic Interventions/Progress Updates:  Ambulation/gait training;Discharge planning;Functional mobility training;Psychosocial support;Therapeutic Activities;Balance/vestibular training;Disease  management/prevention;Neuromuscular re-education;Skin care/wound management;Therapeutic Exercise;Wheelchair propulsion/positioning;DME/adaptive equipment instruction;Pain management;Splinting/orthotics;UE/LE Strength taining/ROM;Community reintegration;Functional electrical stimulation;Patient/family education;Stair training;UE/LE Coordination activities   Today's Interventions: Received pt supine in bed, pt agreeable to therapy, and reported pain 6/10 in LLE but reported having to wait until 2pm for pain medication. Session with emphasis on functional mobility/transfers, generalized strengthening, dynamic standing balance/coordinaiton, simulated car transfers, and improved activity tolerance. Pt continues to remain verbose and requires increased time with all mobility. Pt performed bed mobility with supervision x 2 trials throughout session and donned LUE sling with set up assist. Pt transferred bed<>WC squat<>pivot with CGA. Pt transported to laundry room and therapist placed pt's laundry in washer per OT request. Pt performed WC mobility 16f x 1 and 770fx 1 using RUE/LE and supervision using RLE mainly to steer WC. Pt transferred sit<>stand inside // bars x 4 trials with min A while maintaining LLE NWB precautions. Pt able to remain standing ~15 seconds prior to sitting and reported mild R heel pain with transfer. Attempted to use WB alarm shoe for auditory feedback but shoe not working. [facility] and performed [place] via squat<>pivot with CGA x 1 trial and x 1 trial via lateral scoot due to fatigue and pain with CGA getting into car and min A getting out of car. Pt with increased fear of falling and of pain and therapist provided emotional support and encouragement during session. [facility] and transferred WC<>bed squat<>pivot with CGA. Concluded session with pt supine in bed, needs within reach, and NT  present assessing vitals. Therapist provided ice pack for pt's R heel and RN notified to administer  pain medication.   Therapy Documentation Precautions:  Precautions Precautions: Fall Required Braces or Orthoses: Other Brace Other Brace: R CAM boot, L sling for comfort. Restrictions Weight Bearing Restrictions: Yes RUE Weight Bearing: Weight bearing as tolerated LUE Weight Bearing: Non weight bearing RLE Weight Bearing: Weight bearing as tolerated LLE Weight Bearing: Non weight bearing Other Position/Activity Restrictions: RLE WBAT transfers only  Therapy/Group: Individual Therapy Alfonse Alpers PT, DPT   05/02/2020, 7:24 AM

## 2020-05-02 NOTE — Progress Notes (Signed)
Physical Therapy Session Note  Patient Details  Name: Marissa Smith MRN: [id number] Date of Birth: [birth year]  Today's Date: 05/02/2020 PT Individual Time: 4696-2952 PT Individual Time Calculation (min): 54 min   Short Term Goals: Week 2:  PT Short Term Goal 1 (Week 2): STG=LTG due to LOS  Skilled Therapeutic Interventions/Progress Updates:  Pt received in supported long sitting on bed and agreeable to therapy session. Pt already wearing R LE CAM boot. Assessed vitals: BP 120/67 (MAP 82), HR 63bpm. Transitioned to sitting EOB independently. Donned L UE sling with assist. Educated pt on Orthopedic note stating "Ok for unrestricted ROM" in reference to L UE and L LE. Pt reports she has been performing solely squat pivot transfers with nursing staff today and states she is still not confident with them. Performed block practice EOB<>w/c squat pivot transfers x4 with therapist ensuring L LE NWBing but otherwise pt completing transfer with close supervision - discussed when transferring to R pt not reaching all the way to the armrest but instead pushing with R hand in middle of the w/c seat to increase her leverage to improve hip clearance, pt reports feeling more comfortable with this hand placement - pt states having difficulty keeping L LE off ground during transfer due to decreased strength to keep LE extended and lifted up at the same time. Educated pt on ensuring she has HEP to address these strength impairments. Pt states she has already asked her mother for home measurements including bed height and car seat height. Pt reports her family currently has COVID and so she [place] have to D/C to her sister's 1story house that has 2STE (can be bumped up in w/c) - pt demos excellent problem solving abilities to set-up home environment safely while allowing her to function at wheelchair level. Performed R hemi-technique w/c propulsion ~149ft in/out of room with supervision and cuing for technique to turn in  smaller spaces. Performed seated oral care at sink without assist - discussed home sink/counters [place] have cabinets limitting her ability to get close to the counter top. Misty Stanley, Rec Therapist, present to provide pt with pet therapy to assist with stress management - pt very appreciative of this. Transported back to room and pt reports need to use bathroom - left in care of NT.  Therapy Documentation Precautions:  Precautions Precautions: Fall Required Braces or Orthoses: Other Brace Other Brace: R CAM boot, L sling for comfort. Restrictions Weight Bearing Restrictions: Yes RUE Weight Bearing: Weight bearing as tolerated LUE Weight Bearing: Non weight bearing RLE Weight Bearing: Weight bearing as tolerated LLE Weight Bearing: Non weight bearing Other Position/Activity Restrictions: RLE WBAT transfers only  Pain: Reports R ankle feels sore from yesterday - states performing too many transfers causes it to become sore - otherwise no complaints of pain during session. Pt does state she is nervous about MD plan to adjust her medications in preparation for D/C.   Therapy/Group: Individual Therapy  Ginny Forth , PT, DPT, CSRS  05/02/2020, 7:58 AM

## 2020-05-02 NOTE — Progress Notes (Signed)
Occupational Therapy Weekly Progress Note  Patient Details  Name: Marissa Smith MRN: [id number] Date of Birth: July 27, 1985  Beginning of progress report period: April 25, 2020 End of progress report period: May 02, 2020  Today's Date: 05/02/2020 OT Individual Time: 4709-6283 OT Individual Time Calculation (min): 90 min    Patient has met 3 of 3 short term goals.  Pt is making steady progress towards goals.  Pt is able to complete squat pivot and lateral scoot transfers with CGA to close supervision.  Pt is able to complete bathing and dressing with lateral leans with min A to CGA with improved weight shifting and ability to compensate for injuries.  Pt is able to complete toilet transfers with CGA with UE support on [facility] handle and is able to complete hygiene with leans but requires assistance for clothing management from toilet.    Patient continues to demonstrate the following deficits: muscle weakness and muscle joint tightness, decreased cardiorespiratoy endurance and decreased balance strategies and therefore will continue to benefit from skilled OT intervention to enhance overall performance with BADL and Reduce care partner burden.  Patient progressing toward long term goals..  Continue plan of care.  OT Short Term Goals Week 1:  OT Short Term Goal 1 (Week 1): Pt will be able to wash UB with S. OT Short Term Goal 1 - Progress (Week 1): Met OT Short Term Goal 2 (Week 1): Pt will be able to wash LB with min A. OT Short Term Goal 2 - Progress (Week 1): Met OT Short Term Goal 3 (Week 1): Pt will be able to complete toilet transfer with min A. OT Short Term Goal 3 - Progress (Week 1): Met Week 2:  OT Short Term Goal 1 (Week 2): STG = LTGs due to remaining LOS  Skilled Therapeutic Interventions/Progress Updates:    Treatment session with focus on functional transfers, endurance, and homemaking activities.  Pt received semi-reclined in bed with TR present.  Pt reports laundry in  washing machine.  Pt transferred bed > w/c with squat pivot with CGA.  Pt able to apply leg rests with min cues.  Pt propelled to laundry room for endurance and UB strengthening.  Problem solved how to retrieve laundry from top loader washing machine.  Pt reports increased pain in LLE, therefore deferred standing attempts.  Pt utilized reacher to obtain clothing and placed clothes in dryer.  Engaged in [place] sports activities from seated level with focus on activity tolerance and endurance.  Pt propelled w/c with ADL tubroom, where therapist educated on lateral scoot vs squat pivot transfer to tub bench.  Pt able to complete x2 with CGA.  Discussed typical routine with bathing to increase safety and independence.  Pt returned to laundry room via w/c and retrieved items from dryer.  Pt propelled back to room and engaged in folding laundry and placing items in dresser.  Pt reports need to toilet.  Completed squat pivot transfer on to drop arm BSC with CGA.  Pt able to complete clothing management and hygiene however was unable to advance pants back over hips post toileting.  Pt able to complete partial stand to allow therapist to pull pants over hips.  Pt completed squat pivot BSC > w/c > bed with CGA.  Pt returned to supine and left with all needs in reach.  RN present to administer pain meds.  Therapy Documentation Precautions:  Precautions Precautions: Fall Required Braces or Orthoses: Other Brace Other Brace: R CAM boot, L  sling for comfort. Restrictions Weight Bearing Restrictions: Yes RUE Weight Bearing: Weight bearing as tolerated LUE Weight Bearing: Non weight bearing RLE Weight Bearing: Weight bearing as tolerated LLE Weight Bearing: Non weight bearing Other Position/Activity Restrictions: RLE WBAT transfers only General:   Vital Signs: Therapy Vitals Temp: 98.4 F (36.9 C) Temp Source: Oral Pulse Rate: 73 Resp: 16 BP: (!) 115/54 Patient Position (if appropriate): Lying Oxygen  Therapy SpO2: 99 % O2 Device: Room Air Pain: Pain Assessment Pain Scale: 0-10 Pain Score: 5  Pain Location: Hip Pain Orientation: Left Pain Radiating Towards: down full side Pain Descriptors / Indicators: Aching Pain Frequency: Constant Pain Onset: With Activity Pain Intervention(s): Medication (See eMAR) ADL: ADL Eating: Set up Grooming: Setup Upper Body Bathing: Minimal assistance Lower Body Bathing: Moderate assistance Upper Body Dressing: Supervision/safety Lower Body Dressing: Moderate assistance Toileting: Moderate assistance Where Assessed-Toileting: Toilet ([place] over toilet) Toilet Transfer: Moderate assistance Toilet Transfer Method: Squat pivot (small scoots) Toilet Transfer Equipment: Bedside commode,Grab bars Vision   Perception    Praxis   Exercises:   Other Treatments:     Therapy/Group: Individual Therapy  Simonne Come 05/02/2020, 4:18 PM
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
## 2020-05-03 ENCOUNTER — Inpatient Hospital Stay (HOSPITAL_COMMUNITY): Payer: Medicaid Other | Admitting: Occupational Therapy

## 2020-05-03 ENCOUNTER — Inpatient Hospital Stay (HOSPITAL_COMMUNITY): Payer: Medicaid Other

## 2020-05-03 DIAGNOSIS — G90522 Complex regional pain syndrome I of left lower limb: Secondary | ICD-10-CM

## 2020-05-03 MED ORDER — PREGABALIN 75 MG PO CAPS
150.0000 mg | ORAL_CAPSULE | Freq: Three times a day (TID) | ORAL | Status: DC
  Administered 2020-05-03 – 2020-05-10 (×22): 150 mg via ORAL
  Filled 2020-05-03 (×22): qty 2

## 2020-05-03 MED ORDER — OXYCODONE HCL ER 10 MG PO T12A
10.0000 mg | EXTENDED_RELEASE_TABLET | Freq: Two times a day (BID) | ORAL | Status: DC
  Administered 2020-05-03 – 2020-05-09 (×12): 10 mg via ORAL
  Filled 2020-05-03 (×12): qty 1

## 2020-05-03 NOTE — Progress Notes (Signed)
Patient ID: Marissa Smith, female   DOB: 1985-11-08, 35 y.o.   MRN: [id number]  Pt reports her son has  COVID and daughter and Mom are being tested for. Wanted to give [place] the heads up, MD is aware. Pt is not discharging until 1/26.

## 2020-05-03 NOTE — Progress Notes (Signed)
Physical Therapy Session Note  Patient Details  Name: Marissa Smith MRN: [id number] Date of Birth: [birth year]  Today's Date: 05/03/2020 PT Individual Time: 0800-0900 PT Individual Time Calculation (min): 60 min   Short Term Goals: Week 2:  PT Short Term Goal 1 (Week 2): STG=LTG due to LOS  Skilled Therapeutic Interventions/Progress Updates:   Patient received sitting up in bed, agreeable to PT. She reports 9/10 pain in L LE, 7/10 pain in R ankle/L elbow. PT alerting RN and RN able to provide pain rx. PT providing rest breaks, distractions and repositioning to assist with pain management. She was able to don CAM boot ModI and come to sit edge of bed ModI. Lateral scoot to wc with supervision. Patient transferring to toilet with CGA and supervision for clothing management/perihygiene. PT propelling patient in wc to therapy gym and she transferred to therapy mat with supervision via lateral scoot. Supine mat exercises including L heel slides, SAQ, quad sets. PT providing HEP and patient verbalizing understanding of completing exercises within pain-tolerable range. Patient returning to room in wc, requesting to return to bed. Supervision lateral scoot to bed. Bed alarm on, call light within reach.     Therapy Documentation Precautions:  Precautions Precautions: Fall Required Braces or Orthoses: Other Brace Other Brace: R CAM boot, L sling for comfort. Restrictions Weight Bearing Restrictions: Yes RUE Weight Bearing: Weight bearing as tolerated LUE Weight Bearing: Non weight bearing RLE Weight Bearing: Weight bearing as tolerated LLE Weight Bearing: Non weight bearing Other Position/Activity Restrictions: RLE WBAT transfers only    Therapy/Group: Individual Therapy  Elizebeth Koller, PT, DPT, CBIS  05/03/2020, 7:36 AM

## 2020-05-03 NOTE — Progress Notes (Signed)
Occupational Therapy Session Note  Patient Details  Name: Marissa Smith MRN: [id number] Date of Birth: Oct 14, 1985  Today's Date: 05/03/2020 OT Individual Time: 1406-1506 OT Individual Time Calculation (min): 60 min    Short Term Goals: Week 2:  OT Short Term Goal 1 (Week 2): STG = LTGs due to remaining LOS  Skilled Therapeutic Interventions/Progress Updates:    Treatment session with focus on self-care retraining and functional transfers.  Pt received supine in bed expressing desire to wash up and go to the bathroom. Pt donned CAM boot and completed squat pivot transfer to w/c CGA.  Pt completed squat pivot transfer to [facility] over toilet with CGA.  Pt able to complete clothing management with lateral leans and completed hygiene.  Pt maintained pants doffed as planning to wash up at sink.  Pt engaged in bathing with setup for items and lateral leans to wash buttocks and perineal area.  Pt able to doff and don CAM boot to allow to doff and don pants.  Pt returned to supine in bed to pull pants fully over hips as unable to achieve with lateral leans in bed.  Pt completed grooming tasks seated at sink prior to return to bed.  Discussed d/c planning and recommended equipment for home.  Pt pleased with continued progress. Pt remained semi-reclined in bed with all needs in reach.  Therapy Documentation Precautions:  Precautions Precautions: Fall Required Braces or Orthoses: Other Brace Other Brace: R CAM boot, L sling for comfort. Restrictions Weight Bearing Restrictions: Yes RUE Weight Bearing: Weight bearing as tolerated LUE Weight Bearing: Non weight bearing RLE Weight Bearing: Weight bearing as tolerated LLE Weight Bearing: Non weight bearing Other Position/Activity Restrictions: RLE WBAT transfers only General:   Vital Signs: Therapy Vitals Temp: 97.9 F (36.6 C) Pulse Rate: 73 Resp: 16 BP: 119/62 Patient Position (if appropriate): Lying Oxygen Therapy SpO2: 100 % O2 Device: Room  Air Pain:  Pt with c/o pain in R ankle, premedicated.   Therapy/Group: Individual Therapy  Rosalio Loud 05/03/2020, 3:48 PM

## 2020-05-03 NOTE — Progress Notes (Signed)
Patient transfer via bed to xray, pain score remains at 8/10, po Tytlenol administered

## 2020-05-03 NOTE — Progress Notes (Signed)
Physical Therapy Session Note ° °Patient Details  °Name: Marissa Smith °MRN: [id number] °Date of Birth: 06/28/1985 ° °Today's Date: 05/03/2020 °PT Individual Time: 1000-1056 °PT Individual Time Calculation (min): 56 min  ° °Short Term Goals: °Week 1:  PT Short Term Goal 1 (Week 1): pt will perform bed mobility with supervision °PT Short Term Goal 1 - Progress (Week 1): Met °PT Short Term Goal 2 (Week 1): pt will transfer bed<>chair with LRAD and CGA °PT Short Term Goal 2 - Progress (Week 1): Met °PT Short Term Goal 3 (Week 1): pt will transfer sit<>stand with LRAD and min A °PT Short Term Goal 3 - Progress (Week 1): Met °Week 2:  PT Short Term Goal 1 (Week 2): STG=LTG due to LOS ° °Skilled Therapeutic Interventions/Progress Updates:  ° Received pt supine in bed, pt agreeable to therapy, and reported pain 4/10 in LLE (premedicated). Repositioning, rest breaks, and distraction done to reduce pain levels. Pt reported feeling sore from all the pivoting from yesterday. Session with emphasis on functional mobility/transfers, generalized strengthening, simulated car transfers, and improved activity tolerance. Donned bilateral ted hose with total A and pt donned R CAM boot and LUE sling with set up assist. Pt transferred bed<>WC via lateral scoot instead of squat<>pivot without AD due to soreness with close supervision and performed WC mobility >350ft x 2 trials using RUE/LE and supervision throughout session. Pt performed simulated car transfer with slideboard due to pain and soreness today. Pt transferred into car with CGA and total A to place board and supervision to transfer out of car with pt able to place slideboard with supervision and verbal cues. Pt able to set up transfers and manage WC parts with supervision x 3 trials throughout session. Pt transferred WC<>mat via lateral scoot with supervision and therapist reviewed x-ray results with pt for L forearm, R ankle, and R hip/pelvis.  Pt performed the following  exercises sitting on mat with supervision and verbal cues for technique: °-hip flexion 2x20 with 3lb ankle weight on RLE and 2x15 with 1lb ankle weight on LLE. °-LAQ 2x20 with 3lb ankle weight on RLE and 2x20 with 1lb ankle weight on LLE °Pt transferred mat<>WC via lateral scoot with supervision and transported back to room in WC total A and transferred back to bed with supervision via lateral scoot. Concluded session with pt supine in bed with all needs within reach.  ° °Therapy Documentation °Precautions:  °Precautions °Precautions: Fall °Required Braces or Orthoses: Other Brace °Other Brace: R CAM boot, L sling for comfort. °Restrictions °Weight Bearing Restrictions: Yes °RUE Weight Bearing: Weight bearing as tolerated °LUE Weight Bearing: Non weight bearing °RLE Weight Bearing: Weight bearing as tolerated °LLE Weight Bearing: Non weight bearing °Other Position/Activity Restrictions: RLE WBAT transfers only ° °Therapy/Group: Individual Therapy ° M   °  PT, DPT  ° °05/03/2020, 7:29 AM  °
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
## 2020-05-04 ENCOUNTER — Inpatient Hospital Stay (HOSPITAL_COMMUNITY): Payer: Medicaid Other | Admitting: Physical Therapy

## 2020-05-04 ENCOUNTER — Inpatient Hospital Stay (HOSPITAL_COMMUNITY): Payer: Medicaid Other | Admitting: Occupational Therapy
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
## 2020-05-05 MED ORDER — ACETAMINOPHEN 325 MG PO TABS
650.0000 mg | ORAL_TABLET | Freq: Four times a day (QID) | ORAL | Status: DC
  Administered 2020-05-05 – 2020-05-10 (×20): 650 mg via ORAL
  Filled 2020-05-05 (×21): qty 2

## 2020-05-05 MED ORDER — COVID-19 MRNA VAC-TRIS(PFIZER) 30 MCG/0.3ML IM SUSP
0.3000 mL | Freq: Once | INTRAMUSCULAR | Status: AC
  Administered 2020-05-05: 0.3 mL via INTRAMUSCULAR
  Filled 2020-05-05: qty 0.3
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
## 2020-05-06 ENCOUNTER — Inpatient Hospital Stay (HOSPITAL_COMMUNITY): Payer: Medicaid Other | Admitting: Occupational Therapy

## 2020-05-06 ENCOUNTER — Inpatient Hospital Stay (HOSPITAL_COMMUNITY): Payer: Medicaid Other

## 2020-05-06 DIAGNOSIS — G5772 Causalgia of left lower limb: Secondary | ICD-10-CM

## 2020-05-06 LAB — CBC
HCT: 35.5 % — ABNORMAL LOW (ref 36.0–46.0)
Hemoglobin: 11.7 g/dL — ABNORMAL LOW (ref 12.0–15.0)
MCH: 31 pg (ref 26.0–34.0)
MCHC: 33 g/dL (ref 30.0–36.0)
MCV: 93.9 fL (ref 80.0–100.0)
Platelets: 352 10*3/uL (ref 150–400)
RBC: 3.78 MIL/uL — ABNORMAL LOW (ref 3.87–5.11)
RDW: 13.5 % (ref 11.5–15.5)
WBC: 4.8 10*3/uL (ref 4.0–10.5)
nRBC: 0 % (ref 0.0–0.2)
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
## 2020-05-06 NOTE — Progress Notes (Signed)
Occupational Therapy Session Note  Patient Details  Name: Marissa Smith MRN: [id number] Date of Birth: 08/25/1985  Today's Date: 05/06/2020 OT Individual Time: 1045-1200 OT Individual Time Calculation (min): 75 min    Short Term Goals: Week 2:  OT Short Term Goal 1 (Week 2): STG = LTGs due to remaining LOS  Skilled Therapeutic Interventions/Progress Updates:    Treatment session with focus on self-care retraining and functional transfers.  Pt received upright in w/c agreeable to shower this session.  Pt completed squat pivot transfer w/c > drop arm BSC with CGA.  Pt able to complete lateral leans to doff pants prior to toileting.  Pt completed hygiene with lateral leans.  Squat pivot transfer BSC > w/c > tub bench in room shower with CGA.  Pt able to complete all bathing with supervision this session.  Completed UB dressing with setup from seated level.  Pt engaged in grooming tasks seated at sink at Mod I level.  Pt donned pants, L sock, and CAM boot on RLE with supervision, then completed lateral scoot to bed.  Pt pulled pants over hips from semi-reclined at EOB without assistance.  Pt remained semi-reclined in bed with all needs in reach.  Discussed d/c plans as pt with sick family members, awaiting possible change in d/c plan/date.  Therapy Documentation Precautions:  Precautions Precautions: Fall Required Braces or Orthoses: Other Brace Other Brace: R CAM boot, L sling for comfort. Restrictions Weight Bearing Restrictions: Yes RUE Weight Bearing: Weight bearing as tolerated LUE Weight Bearing: Non weight bearing RLE Weight Bearing: Weight bearing as tolerated LLE Weight Bearing: Non weight bearing Other Position/Activity Restrictions: RLE WBAT transfers only Pain: Pain Assessment Pain Score: 6  Faces Pain Scale: Hurts little more   Therapy/Group: Individual Therapy  Rosalio Loud 05/06/2020, 12:33 PM

## 2020-05-06 NOTE — Progress Notes (Signed)
Patient ID: Marissa Smith, female   DOB: 11-Feb-1986, 35 y.o.   MRN: [id number]  Pt reports her children have COVID by 1/29 they will be out of quarantine and she can go home. MD aware and agreeable to this plan. Will make team aware.

## 2020-05-06 NOTE — Progress Notes (Signed)
Physical Therapy Session Note  Patient Details  Name: Marissa Smith MRN: [id number] Date of Birth: [birth year]  Today's Date: 05/06/2020 PT Individual Time: 1500-1600 PT Individual Time Calculation (min): 60 min   Short Term Goals: Week 2:  PT Short Term Goal 1 (Week 2): STG=LTG due to LOS  Skilled Therapeutic Interventions/Progress Updates:    Patient in room in w/c with visitor present.  Already with camboot donned, put on sling independent and propelled w/c to ortho gym x 250'.  Performed ramp negotiation with min A.  Pivot transfer to mat with S including w/c set up.  Patient sit to supine with S and performed stretch to hip flexors laying flat on mat, glut sets x 5, R sidelying for assisted hip extension on L.  Patient performed hooklying post pelvic tilt with marching LE's mod cues for technique, then tilt with hooklying hip abduction/adduction x 5, then double knee lifts x 5.  Patient supine to sit with S.  Transferred to w/c with S after set up.  Pushed in w/c to dayroom for energy conservation & to avoid repetitive use injury to L UE (pt educated).  Performed seated reaching activity with R UE to play Wii bowling w/c level.  Patient propelled to room with S, transferred to bed with S and left with needs in reach and bed alarm active.   Therapy Documentation Precautions:  Precautions Precautions: Fall Required Braces or Orthoses: Other Brace Other Brace: R CAM boot, L sling for comfort. Restrictions Weight Bearing Restrictions: Yes RUE Weight Bearing: Weight bearing as tolerated LUE Weight Bearing: Non weight bearing RLE Weight Bearing: Weight bearing as tolerated LLE Weight Bearing: Non weight bearing Other Position/Activity Restrictions: RLE WBAT transfers only Pain: Pain Assessment Pain Score: 6  Faces Pain Scale: Hurts little more   Therapy/Group: Individual Therapy  Elray Mcgregor  Sheran Lawless, PT 05/06/2020, 12:50 PM
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
## 2020-05-06 NOTE — Progress Notes (Signed)
Physical Therapy Session Note  Patient Details  Name: Marissa Smith MRN: [id number] Date of Birth: 20-Sep-1985  Today's Date: 05/06/2020 PT Individual Time: 0900-0956 PT Individual Time Calculation (min): 56 min   Short Term Goals: Week 1:  PT Short Term Goal 1 (Week 1): pt will perform bed mobility with supervision PT Short Term Goal 1 - Progress (Week 1): Met PT Short Term Goal 2 (Week 1): pt will transfer bed<>chair with LRAD and CGA PT Short Term Goal 2 - Progress (Week 1): Met PT Short Term Goal 3 (Week 1): pt will transfer sit<>stand with LRAD and min A PT Short Term Goal 3 - Progress (Week 1): Met Week 2:  PT Short Term Goal 1 (Week 2): STG=LTG due to LOS  Skilled Therapeutic Interventions/Progress Updates:   Received pt sitting in WC getting dressed, pt agreeable to therapy, and reported pain 7/10 in LLE and reported MD decreased her pain medication yesterday. Repositioning and rest breaks done to reduce pain levels. Session with emphasis on functional mobility/transfers, generalized strengthening, simulated car transfers, and improved activity tolerance. Therapist did pt's hair dependently and donned bilateral ted hose and L non-skid sock with total A for edema management. Pt donned CAM boot and LUE sling with set up assist. Pt performed WC mobility >376f x 2 trials using RUE/LE with supervision to/from ortho gym. Pt able to set up transfer to car and manage WC parts with supervision. Pt performed simulated car transfer via lateral scoot/squat<>pivot with CGA/close supervision with cues for hand placement and anterior weight shifting while maintaining WB precautions. Pt performed WC mobility 175fon uneven surfaces (ramp) with RUE/LE and mod A to ascend ramp and CGA to descend ramp. Pt performed the following exercises [facility] and verbal cues for technique: -bicep curls on RUE 3x15 with 10lb dumbbell  -LAQ 3x20 bilaterally with 4lb ankle weight on RLE and 1lb ankle  weight on LLE Pt performed WC mobility additional 5038fsing RUE/LE back to room with supervision. Concluded session with pt sitting in [facility] all needs within reach. Pt agreeable to continue working on leg exercises that she did not get to during session due to time constraints.   Therapy Documentation Precautions:  Precautions Precautions: Fall Required Braces or Orthoses: Other Brace Other Brace: R CAM boot, L sling for comfort. Restrictions Weight Bearing Restrictions: Yes RUE Weight Bearing: Weight bearing as tolerated LUE Weight Bearing: Non weight bearing RLE Weight Bearing: Weight bearing as tolerated LLE Weight Bearing: Non weight bearing Other Position/Activity Restrictions: RLE WBAT transfers only  Therapy/Group: Individual Therapy AnnAlfonse Alpers, DPT   05/06/2020, 7:22 AM

## 2020-05-07 ENCOUNTER — Inpatient Hospital Stay (HOSPITAL_COMMUNITY): Payer: Medicaid Other | Admitting: Occupational Therapy

## 2020-05-07 ENCOUNTER — Inpatient Hospital Stay (HOSPITAL_COMMUNITY): Payer: Medicaid Other

## 2020-05-07 MED ORDER — AMITRIPTYLINE HCL 10 MG PO TABS
10.0000 mg | ORAL_TABLET | Freq: Every day | ORAL | Status: DC
  Administered 2020-05-07 – 2020-05-09 (×3): 10 mg via ORAL
  Filled 2020-05-07 (×4): qty 1

## 2020-05-07 MED ORDER — METHYLPREDNISOLONE 4 MG PO TABS
8.0000 mg | ORAL_TABLET | Freq: Four times a day (QID) | ORAL | Status: DC
  Administered 2020-05-07 – 2020-05-10 (×13): 8 mg via ORAL
  Filled 2020-05-07 (×15): qty 2

## 2020-05-07 MED ORDER — METHYLPREDNISOLONE 4 MG PO TABS: 8.0000 mg | ORAL_TABLET | Freq: Every day | ORAL | Status: DC

## 2020-05-07 MED ORDER — METHYLPREDNISOLONE 2 MG PO TABS: 2.0000 mg | ORAL_TABLET | Freq: Every day | ORAL | Status: DC

## 2020-05-07 MED ORDER — METHYLPREDNISOLONE 2 MG PO TABS: 2.0000 mg | ORAL_TABLET | Freq: Two times a day (BID) | ORAL | Status: DC

## 2020-05-07 MED ORDER — METHYLPREDNISOLONE 4 MG PO TABS: 8.0000 mg | ORAL_TABLET | Freq: Two times a day (BID) | ORAL | Status: DC

## 2020-05-07 MED ORDER — METHYLPREDNISOLONE 4 MG PO TABS: 4.0000 mg | ORAL_TABLET | Freq: Two times a day (BID) | ORAL | Status: DC

## 2020-05-07 NOTE — Plan of Care (Signed)
  Problem: RH Bed to Chair Transfers Goal: LTG Patient will perform bed/chair transfers w/assist (PT) Description: LTG: Patient will perform bed to chair transfers with assistance (PT). Flowsheets (Taken 05/07/2020 0751) LTG: Pt will perform Bed to Chair Transfers with assistance level: (upgraded due to imporved strength and balance) Independent with assistive device  Note: upgraded due to imporved strength and balance   Problem: RH Wheelchair Mobility Goal: LTG Patient will propel w/c in controlled environment (PT) Description: LTG: Patient will propel wheelchair in controlled environment, # of feet with assist (PT) 05/07/2020 0751 by Alfonso Patten, PT Flowsheets (Taken 05/07/2020 [phone]) LTG: Pt will propel w/c in controlled environ  assist needed:: Independent with assistive device LTG: Propel w/c distance in controlled environment: 132ft 05/07/2020 0751 by Alfonso Patten, PT Reactivated Goal: LTG Patient will propel w/c in home environment (PT) Description: LTG: Patient will propel wheelchair in home environment, # of feet with assistance (PT). 05/07/2020 0751 by Alfonso Patten, PT Flowsheets (Taken 05/07/2020 [phone]) LTG: Pt will propel w/c in home environ  assist needed:: Independent with assistive device LTG: Propel w/c distance in home environment: 66ft 05/07/2020 0751 by Alfonso Patten, PT Reactivated

## 2020-05-07 NOTE — Progress Notes (Signed)
Lovenox teaching started this morning. Patient understood. Need to reinforce in am. Reported to incoming nurse.

## 2020-05-07 NOTE — Progress Notes (Signed)
Occupational Therapy Session Note  Patient Details  Name: Marissa Smith MRN: [id number] Date of Birth: 1985/10/11  Today's Date: 05/07/2020 OT Individual Time: 0730-0830 OT Individual Time Calculation (min): 60 min    Short Term Goals: Week 2:  OT Short Term Goal 1 (Week 2): STG = LTGs due to remaining LOS  Skilled Therapeutic Interventions/Progress Updates:    Treatment session with focus on self-care retraining and functional mobility.  Pt received upright in bed talking to her children on the phone.  Pt expressing desire to do laundry.  Pt doffed clothing seated EOB and donned hospital disposable scrubs with supervision/setup with lateral leans for pants.  Pt donned CAM boot without assistance and donned gripper sock.  Pt completed squat pivot transfer CGA to w/c.  Pt propelled w/c to laundry room without assistance. Pt positioned close enough to place clothing in washing machine and manage controls.  Pt returned to room and completed grooming and UB bathing seated at sink Mod I.  Discussed modified d/c date due to family members having COVID, pt reports emotional about extension but understanding.  Pt continues to c/o pain and nerve pain in LLE, pt spoke with MD about alternative treatments and further discussed with therapist after MD departed.  Pt remained upright in w/c to finish breakfast.  RN arriving to provide morning meds.  Therapy Documentation Precautions:  Precautions Precautions: Fall Required Braces or Orthoses: Other Brace Other Brace: R CAM boot, L sling for comfort. Restrictions Weight Bearing Restrictions: Yes RUE Weight Bearing: Weight bearing as tolerated LUE Weight Bearing: Non weight bearing RLE Weight Bearing: Non weight bearing LLE Weight Bearing: Non weight bearing Other Position/Activity Restrictions: RLE WBAT transfers only Pain:  Pt with c/o pain 7-8 in LLE.  MD aware, plans to modify pain meds.   Therapy/Group: Individual Therapy  Rosalio Loud 05/07/2020, 9:43 AM

## 2020-05-07 NOTE — Progress Notes (Signed)
Physical Therapy Session Note  Patient Details  Name: Marissa Smith MRN: [id number] Date of Birth: 01/12/1986  Today's Date: 05/07/2020 PT Individual Time: 1000-1054 PT Individual Time Calculation (min): 54 min   Short Term Goals: Week 1:  PT Short Term Goal 1 (Week 1): pt will perform bed mobility with supervision PT Short Term Goal 1 - Progress (Week 1): Met PT Short Term Goal 2 (Week 1): pt will transfer bed<>chair with LRAD and CGA PT Short Term Goal 2 - Progress (Week 1): Met PT Short Term Goal 3 (Week 1): pt will transfer sit<>stand with LRAD and min A PT Short Term Goal 3 - Progress (Week 1): Met Week 2:  PT Short Term Goal 1 (Week 2): STG=LTG due to LOS  Skilled Therapeutic Interventions/Progress Updates:   Received pt sitting in bed with Chaplin present at bedside, pt agreeable to therapy, and reported pain 7/10 in LLE (premedicated). Repositioning and rest breaks done to reduce pain levels. Session with emphasis on functional mobility/transfers, generalized strengthening, dynamic standing balance/coordination, and improved activity tolerance. Donned L non-skid sock total A and pt transferred bed<>WC squat<>pivot with close supervision. Pt performed WC mobility 135f using RUE/LE to therapy gym with supervision. Pt transferred sit<>stand in parallel bars x 6 trials with close supervision while maintaining WB precautions. Pt performed the following exercises standing with 1 UE support and close supervision: -LLE hip flexion 3x5 -LLE hip abduction 1x5 and 1x10 [facility] and [facility]<>mat squat<>pivot with close supervision and sit<>supine with supervision. Pt performed the following exercises supine on mat with supervision and verbal cues for technique: -SLR 2x8 on LLE (decreased ROM) and 2x10 on RLE -marching 2x15 bilaterally  Pt rolled L and R and transferred into prone with supervision while maintaining LUE NWB precautions and performed R  sidelying LLE clamshells x5 reps with increased difficulty. In prone pt performed the following exercises with supervision and verbal cues for technique: -hamstring curls x10 bilaterally -hip extension 2x8 bilaterally  Pt transferred prone<>R sidelying<>sitting with supervision and transferred mat<>WC lateral scoot with supervision. Pt able to get laundry from dryer and performed WC mobility additional 51fusing RUE/LE back to room and performed lateral scoot back to bed with supervision. Concluded session with pt supine in bed with all needs within reach.   Therapy Documentation Precautions:  Precautions Precautions: Fall Required Braces or Orthoses: Other Brace Other Brace: R CAM boot, L sling for comfort. Restrictions Weight Bearing Restrictions: Yes RUE Weight Bearing: Weight bearing as tolerated LUE Weight Bearing: Non weight bearing RLE Weight Bearing: Non weight bearing LLE Weight Bearing: Non weight bearing Other Position/Activity Restrictions: RLE WBAT transfers only  Therapy/Group: Individual Therapy AnAlfonse AlpersT, DPT   05/07/2020, 7:31 AM
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
## 2020-05-07 NOTE — Progress Notes (Signed)
   05/07/20 0958  Clinical Encounter Type  Visited With Patient  Visit Type Follow-up   Chaplain followed-up with Pt. Pt was in better spirits today, even though she was tired from lack of sleep due to pain. Pt is looking forward to discharge this coming Saturday. Pt is very happy with the rehab team.   This note was prepared by Chaplain Resident, Tacy Learn, MDiv. For questions, please contact by phone at [phone].

## 2020-05-07 NOTE — Progress Notes (Signed)
Physical Therapy Session Note  Patient Details  Name: Marissa Smith MRN: [id number] Date of Birth: 1986/01/18  Today's Date: 05/07/2020 PT Individual Time: 0300-9233 PT Individual Time Calculation (min): 60 min   Short Term Goals: Week 2:  PT Short Term Goal 1 (Week 2): STG=LTG due to LOS  Skilled Therapeutic Interventions/Progress Updates:    Patient in supine and agreeable to PT.  Reports poor sleep due to pain in L leg and now taking prednisone.  Patient transferred to w/c with S after applying sling to L UE and w/c set up.  Patient applied L legrest and propelled with S to ADL apartment.  Performed transfer to couch with mod cues and close S/CGA for safety due to far to reach to armrest, noted placing weight on L LE and discussed moving to L if safer and able to use L armrest on w/c.  Performed with improved technique with S.  Patient transferred in gym to armchair and discussed increased anterior weight shift onto R leg to improve safety with less "plopping" and less risk for knocking over chair.  Patient seated in w/c for hip flexion with 3# on R and 1.5# on L 3 x 20 reps, seated 2 x 10 reps abduction with green t-band.  Transferred to mat and seated to bounce ball on rebounder catching and tossing with R hand only.  Laying back on mat for stretch to hips and shoulders.  Returned to w/c with S and assisted partway to room.  Transferred to bed with S.  Left in supine with RN in the room and needs in reach.  Therapy Documentation Precautions:  Precautions Precautions: Fall Required Braces or Orthoses: Other Brace Other Brace: R CAM boot, L sling for comfort. Restrictions Weight Bearing Restrictions: Yes RUE Weight Bearing: Weight bearing as tolerated LUE Weight Bearing: Non weight bearing RLE Weight Bearing: Non weight bearing LLE Weight Bearing: Non weight bearing Other Position/Activity Restrictions: RLE WBAT transfers only Pain: Pain Assessment Pain Scale: 0-10 Pain Score: 9   Faces Pain Scale: Hurts even more Pain Type: Acute pain Pain Location: Leg Pain Orientation: Left Pain Descriptors / Indicators: Aching Pain Onset: On-going Pain Intervention(s): Distraction   Therapy/Group: Individual Therapy  Elray Mcgregor  Sheran Lawless, PT 05/07/2020, 5:39 PM

## 2020-05-07 NOTE — Progress Notes (Signed)
Occupational Therapy Session Note  Patient Details  Name: Marissa Smith MRN: [id number] Date of Birth: 10/08/1985  Today's Date: 05/07/2020 OT Individual Time: 1300-1345 OT Individual Time Calculation (min): 45 min    Short Term Goals: Week 2:  OT Short Term Goal 1 (Week 2): STG = LTGs due to remaining LOS  Skilled Therapeutic Interventions/Progress Updates:    Treatment session with focus on functional mobility over uneven surfaces, on/off elevators, and through doorways.  Pt received semi-reclined in bed expressing desire to get some "fresh air".  Discussed w/c mobility outside hospital entrance.  Pt completed squat pivot transfer supervision to w/c.  Pt propelled w/c 50% of time due to long distance.  Pt did propel w/c on/off elevator with min cues for technique, over doorway thresholds and outside hospital entrance.  Engaged in discussion regarding upcoming d/c and preparation.  Pt continues to have a positive outlook about her recovery, despite continued pain.  Therapy Documentation Precautions:  Precautions Precautions: Fall Required Braces or Orthoses: Other Brace Other Brace: R CAM boot, L sling for comfort. Restrictions Weight Bearing Restrictions: Yes RUE Weight Bearing: Weight bearing as tolerated LUE Weight Bearing: Non weight bearing RLE Weight Bearing: Non weight bearing LLE Weight Bearing: Non weight bearing Other Position/Activity Restrictions: RLE WBAT transfers only General:   Vital Signs: Therapy Vitals Temp: 97.6 F (36.4 C) Pulse Rate: (!) 57 Resp: 18 BP: (!) 105/56 Patient Position (if appropriate): Lying Oxygen Therapy SpO2: 100 % O2 Device: Room Air Pain: Pain Assessment Pain Scale: 0-10 Pain Score: 7  Pain Type: Acute pain;Surgical pain Pain Location: Leg Pain Orientation: Left Pain Descriptors / Indicators: Aching Pain Frequency: Constant Pain Onset: On-going Pain Intervention(s): Medication (See eMAR)   Therapy/Group: Individual  Therapy  Rosalio Loud 05/07/2020, 3:43 PM

## 2020-05-07 NOTE — Plan of Care (Signed)
  Problem: RH Dressing Goal: LTG Patient will perform lower body dressing w/assist (OT) Description: LTG: Patient will perform lower body dressing with assist, with/without cues in positioning using equipment (OT) Flowsheets (Taken 05/07/2020 0809) LTG: Pt will perform lower body dressing with assistance level of: (upgraded) Supervision/Verbal cueing Note: Upgraded due to progress   Problem: RH Toileting Goal: LTG Patient will perform toileting task (3/3 steps) with assistance level (OT) Description: LTG: Patient will perform toileting task (3/3 steps) with assistance level (OT)  Flowsheets (Taken 05/07/2020 0809) LTG: Pt will perform toileting task (3/3 steps) with assistance level: (upgraded) Supervision/Verbal cueing Note: Upgraded due to progress   Problem: RH Tub/Shower Transfers Goal: LTG Patient will perform tub/shower transfers w/assist (OT) Description: LTG: Patient will perform tub/shower transfers with assist, with/without cues using equipment (OT) Flowsheets (Taken 05/07/2020 0809) LTG: Pt will perform tub/shower stall transfers with assistance level of: (upgraded) Contact Guard/Touching assist Note: Upgraded due to progress towards goals

## 2020-05-08 ENCOUNTER — Inpatient Hospital Stay (HOSPITAL_COMMUNITY): Payer: Medicaid Other | Admitting: Occupational Therapy

## 2020-05-08 ENCOUNTER — Inpatient Hospital Stay (HOSPITAL_COMMUNITY): Payer: Medicaid Other

## 2020-05-08 ENCOUNTER — Inpatient Hospital Stay (HOSPITAL_COMMUNITY): Payer: Medicaid Other | Admitting: Physical Therapy

## 2020-05-08 DIAGNOSIS — M79605 Pain in left leg: Secondary | ICD-10-CM

## 2020-05-08 LAB — CREATININE, SERUM
Creatinine, Ser: 0.57 mg/dL (ref 0.44–1.00)
GFR, Estimated: >60 mL/min (ref >60)

## 2020-05-08 MED ORDER — LIDOCAINE 5 % EX PTCH
3.0000 | MEDICATED_PATCH | CUTANEOUS | Status: DC
  Administered 2020-05-08 – 2020-05-10 (×3): 3 via TRANSDERMAL
  Filled 2020-05-08 (×3): qty 3

## 2020-05-08 NOTE — Progress Notes (Deleted)
Have attempted to contact floor multiple times.  Left on hold or no one answers.

## 2020-05-08 NOTE — Progress Notes (Signed)
Occupational Therapy Session Note  Patient Details  Name: Marissa Smith MRN: [id number] Date of Birth: [birth year]  Today's Date: 05/08/2020 OT Individual Time: 1400-1500 OT Individual Time Calculation (min): 60 min    Short Term Goals: Week 2:  OT Short Term Goal 1 (Week 2): STG = LTGs due to remaining LOS  Skilled Therapeutic Interventions/Progress Updates:    Treatment session with focus on functional mobility and transfers in ADL apt to simulate home environment.  Pt received semi-reclined in bed reporting increased pain in LLE this session compared to this AM.  Pt discussing concerns in regards to d/c home.  Discussed home routine to increase independence and encouraging pt to advocate for self in regards to smoking cessation, Mod I at w/c level, and not being overwhelmed by visitors and too much assistance.  Pt encouraged to use her resources and family supports while also continuing to do for herself as she is able.  Pt completed all transfers Supervision this session. Pt transferred bed <> w/c and w/c <> couch in ADL apt.  Pt completed mobility in ADL apt via w/c while accessing items in refrigerator as well as countertops and lower cabinets.  Problem solved access to various items and when to complete tasks Mod I vs receiving assist from family.  Engaged in w/c mobility with focus on improved positioning to more safely access items in kitchen.  Pt returned to room and transferred back to bed supervision.  Pt remained semi-reclined in bed with all needs in reach.  Therapy Documentation Precautions:  Precautions Precautions: Fall Required Braces or Orthoses: Other Brace Other Brace: R CAM boot, L sling for comfort. Restrictions Weight Bearing Restrictions: Yes RUE Weight Bearing: Weight bearing as tolerated LUE Weight Bearing: Non weight bearing RLE Weight Bearing: Non weight bearing LLE Weight Bearing: Non weight bearing Other Position/Activity Restrictions: RLE WBAT transfers  only General:   Vital Signs: Therapy Vitals Temp: 98 F (36.7 C) Pulse Rate: 62 Resp: 17 BP: (!) 103/54 Patient Position (if appropriate): Lying Oxygen Therapy SpO2: 98 % O2 Device: Room Air Pain: Pain Assessment Pain Scale: 0-10 Pain Score: 6  Faces Pain Scale: Hurts little more Pain Type: Acute pain Pain Location: Leg Pain Orientation: Left Pain Descriptors / Indicators: Aching Pain Frequency: Constant Pain Onset: On-going Pain Intervention(s): Medication (See eMAR)   Therapy/Group: Individual Therapy  Rosalio Loud 05/08/2020, 3:53 PM

## 2020-05-08 NOTE — Progress Notes (Signed)
Occupational Therapy Session Note  Patient Details  Name: Marissa Smith MRN: [id number] Date of Birth: April 21, 1985  Today's Date: 05/08/2020 OT Individual Time: 0730-0830 OT Individual Time Calculation (min): 60 min    Short Term Goals: Week 2:  OT Short Term Goal 1 (Week 2): STG = LTGs due to remaining LOS  Skilled Therapeutic Interventions/Progress Updates:    Treatment session with focus on self-care retraining, functional transfers, and sit > stand.  Pt received upright in bed finishing breakfast.  Pt reports sleeping better overnight and reports that her pain in LLE feels "different" after change in medications and start of prednisone.  Pt reports feeling overall better and well rested with less c/o pain.  Pt agreeable to shower this session.  Pt completed bed mobility Mod I and squat pivot transfer to w/c Supervision.  Pt completed transfer to [facility] over toilet with supervision and setup for appropriate positioning.  Pt completed squat pivot transfer BSC > w/c > tub bench in room shower with supervision/setup.  Pt completed all bathing at supervision level, demonstrating increased ROM in B hips and even LUE to assist with washing back.  Pt completed dressing with setup for UB dressing and donned pants and CAM boot with setup assist.  Pt able to pull pants over hips at sit > stand level this session with RUE support on bed rail.  Discussed lateral leans vs sit > stand with UE support to maintain NWB through LLE.  Pt transferred back to bed at end of session supervision and then Mod I bed mobility.    Therapy Documentation Precautions:  Precautions Precautions: Fall Required Braces or Orthoses: Other Brace Other Brace: R CAM boot, L sling for comfort. Restrictions Weight Bearing Restrictions: Yes RUE Weight Bearing: Weight bearing as tolerated LUE Weight Bearing: Non weight bearing RLE Weight Bearing: Non weight bearing LLE Weight Bearing: Non weight bearing Other Position/Activity  Restrictions: RLE WBAT transfers only General:   Vital Signs: Therapy Vitals Temp: 97.7 F (36.5 C) Pulse Rate: (!) 58 Resp: 18 BP: (!) 105/56 Patient Position (if appropriate): Lying Oxygen Therapy SpO2: 99 % O2 Device: Room Air Pain: Pt with c/o pain in LLE, reports "different" than previous days.    Therapy/Group: Individual Therapy  Rosalio Loud 05/08/2020, 8:51 AM
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
## 2020-05-08 NOTE — Progress Notes (Signed)
Patient ID: Marissa Smith, female   DOB: [birth year], 35 y.o.   MRN: [id number]  Met with pt who is aware of the team conference and new date of 1/29 due to her families COVID. Pt is having pain in her legs MD and RN aware of this. Work toward discharge 1/29. Will not need follow up until her WB is increased.

## 2020-05-08 NOTE — Progress Notes (Signed)
Physical Therapy Session Note  Patient Details  Name: Marissa Smith MRN: [id number] Date of Birth: May 16, 1985  Today's Date: 05/08/2020 PT Individual Time: 1000-1041 PT Individual Time Calculation (min): 41 min   Short Term Goals: Week 1:  PT Short Term Goal 1 (Week 1): pt will perform bed mobility with supervision PT Short Term Goal 1 - Progress (Week 1): Met PT Short Term Goal 2 (Week 1): pt will transfer bed<>chair with LRAD and CGA PT Short Term Goal 2 - Progress (Week 1): Met PT Short Term Goal 3 (Week 1): pt will transfer sit<>stand with LRAD and min A PT Short Term Goal 3 - Progress (Week 1): Met Week 2:  PT Short Term Goal 1 (Week 2): STG=LTG due to LOS  Skilled Therapeutic Interventions/Progress Updates:   Received pt sitting in bed, pt agreeable to therapy, and reported pain 6/10 in LLE (premedicated). Pt reported feeling anxious as she was informed that she will have to give herself 2 shots/day upon D/C. Therapist provided emotional support and repositioning and rest breaks done to reduce pain levels. Session with emphasis on functional mobility/transfers, generalized strengthening, simulated car transfers, dynamic sitting balance/coordination, and improved activity tolerance. Donned bilateral ted hose and L non-skid sock with total A. Pt donned R Cam boot with set up assist and transferred bed<>WC squat<>pivot with close supervision x 2 trials throughout session. Pt performed WC mobility 370f x 2 trials and 1044fx 1 trial using RUE/LE and supervision. Pt performed simulated car transfer via squat<>pivot with close supervision with cues to avoid pulling on movable parts of car. Pt performed RLE strengthening on Kinetron at 20 cm/sec for 1 minute x 1 trial with therapist providing manual counter resistance with emphasis on glute/quad strengthening. Concluded session with pt sitting EOB with all needs within reach.   Therapy Documentation Precautions:  Precautions Precautions:  Fall Required Braces or Orthoses: Other Brace Other Brace: R CAM boot, L sling for comfort. Restrictions Weight Bearing Restrictions: Yes RUE Weight Bearing: Weight bearing as tolerated LUE Weight Bearing: Non weight bearing RLE Weight Bearing: Non weight bearing LLE Weight Bearing: Non weight bearing Other Position/Activity Restrictions: RLE WBAT transfers only  Therapy/Group: Individual Therapy AnAlfonse AlpersT, DPT   05/08/2020, 7:26 AM

## 2020-05-08 NOTE — Progress Notes (Signed)
Left lower extremity venous study completed.     Please see CV Proc for preliminary results.   Janyiah Silveri, RVT  
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
## 2020-05-08 NOTE — Progress Notes (Signed)
Physical Therapy Session Note  Patient Details  Name: Marissa Smith MRN: [id number] Date of Birth: [birth year]  Today's Date: 05/08/2020 PT Individual Time: 1615-1704 PT Individual Time Calculation (min): 49 min   Short Term Goals: Week 2:  PT Short Term Goal 1 (Week 2): STG=LTG due to LOS  Skilled Therapeutic Interventions/Progress Updates:    Pt received supine, resting in bed and agreeable to therapy session. R LE CAM boot donned during session. Supine>sitting L EOB independently. Donned L UE sling without assist. R squat pivot EOB>w/c with supervision - pt demos and reports much more confidence with this transfer technique compared to previous session with this therapist. R hemi-technique w/c propulsion ~129ft x2 to/from therapy gym with supervision. R squat pivot w/c>EOM with supervision - cuing to ensure proper w/c set-up and locking brakes. Sit<>supine<>rolling prone without physical assist but cuing for sequencing to maintain L hemibody WBing precautions.  Performed the following exercises: - L LE ankle DF stretch using gait belt 2x20minute with cuing for proper form/technique to avoid excessive inversion (of note: initially attempted this in long sitting but pt reports significant nerve pain in foot - much more tolerable in supine - possible neural tension?)  - educated pt on importance of maintaining full ankle DF ROM in preparation for WBing during transfers when approved by MD - L LE active assist straight leg raise x10 - cuing to maintain quad and ankle DF activation throughout movement - sidelying L LE hip abduction active assist x10 reps - cuing to maintain proper hip alignment and to activate quads and ankle DF during the movement  - prone L LE hip extension with knee flexed targeting glutes x10reps   L squat pivot EOM>w/c with supervision. Educated pt on ensuring she leaves w/c set-up within reach so she can lock brakes and manage armrests when planning to transfer back from the bed  or other surfaces at home. W/c mobility back to room and pt requesting to return back to bed. Set-up and performed L squat pivot transfer w/c>EOB with supervision and cuing for w/c part management (flipping back armrest) for increased safety of transfer. Sit>supine mod-I using bedrails. Assisted with doffing TED hose and R CAM boot for pain management. Pt left supine in bed with needs in reach and bed alarm on.  Therapy Documentation Precautions:  Precautions Precautions: Fall Required Braces or Orthoses: Other Brace Other Brace: R CAM boot, L sling for comfort. Restrictions Weight Bearing Restrictions: Yes RUE Weight Bearing: Weight bearing as tolerated LUE Weight Bearing: Non weight bearing RLE Weight Bearing: Non weight bearing LLE Weight Bearing: Non weight bearing Other Position/Activity Restrictions: RLE WBAT transfers only  Pain: Reports pain is 7/10 - states that she received her morning medications late and hasn't been able to catch back up on the pain.   Therapy/Group: Individual Therapy  Ginny Forth , PT, DPT, CSRS  05/08/2020, 4:10 PM

## 2020-05-09 ENCOUNTER — Inpatient Hospital Stay (HOSPITAL_COMMUNITY): Payer: Medicaid Other

## 2020-05-09 ENCOUNTER — Inpatient Hospital Stay (HOSPITAL_COMMUNITY): Payer: Medicaid Other | Admitting: Physical Therapy

## 2020-05-09 ENCOUNTER — Other Ambulatory Visit (HOSPITAL_COMMUNITY): Payer: Self-pay | Admitting: Physician Assistant

## 2020-05-09 ENCOUNTER — Inpatient Hospital Stay (HOSPITAL_COMMUNITY): Payer: Medicaid Other | Admitting: Occupational Therapy

## 2020-05-09 DIAGNOSIS — M5442 Lumbago with sciatica, left side: Secondary | ICD-10-CM

## 2020-05-09 DIAGNOSIS — G8929 Other chronic pain: Secondary | ICD-10-CM

## 2020-05-09 LAB — BASIC METABOLIC PANEL
Anion gap: 8 (ref 5–15)
BUN: 13 mg/dL (ref 6–20)
CO2: 27 mmol/L (ref 22–32)
Calcium: 9.1 mg/dL (ref 8.9–10.3)
Chloride: 104 mmol/L (ref 98–111)
Creatinine, Ser: 0.59 mg/dL (ref 0.44–1.00)
GFR, Estimated: >60 mL/min (ref >60)
Glucose, Bld: 99 mg/dL (ref 70–99)
Potassium: 4.4 mmol/L (ref 3.5–5.1)
Sodium: 139 mmol/L (ref 135–145)

## 2020-05-09 MED ORDER — LIDOCAINE 5 % EX PTCH: 3.0000 | MEDICATED_PATCH | CUTANEOUS | 0 refills | Status: DC

## 2020-05-09 MED ORDER — FAMOTIDINE 20 MG PO TABS: 20.0000 mg | ORAL_TABLET | Freq: Two times a day (BID) | ORAL | 0 refills | Status: DC

## 2020-05-09 MED ORDER — POLYETHYLENE GLYCOL 3350 17 G PO PACK: 17.0000 g | PACK | Freq: Two times a day (BID) | ORAL | 0 refills | Status: DC

## 2020-05-09 MED ORDER — OXYCODONE HCL 10 MG PO TABS: 10.0000 mg | ORAL_TABLET | ORAL | 0 refills | Status: DC | PRN

## 2020-05-09 MED ORDER — HYDROXYZINE HCL 25 MG PO TABS: 25.0000 mg | ORAL_TABLET | Freq: Four times a day (QID) | ORAL | 0 refills | Status: DC | PRN

## 2020-05-09 MED ORDER — DOCUSATE SODIUM 100 MG PO CAPS: 100.0000 mg | ORAL_CAPSULE | Freq: Two times a day (BID) | ORAL | 0 refills | Status: DC

## 2020-05-09 MED ORDER — DIAZEPAM 5 MG PO TABS
5.0000 mg | ORAL_TABLET | Freq: Four times a day (QID) | ORAL | 0 refills | Status: DC | PRN
Start: 2020-05-09 — End: 2020-06-10

## 2020-05-09 MED ORDER — MELATONIN 3 MG PO TABS: 3.0000 mg | ORAL_TABLET | Freq: Every day | ORAL | 0 refills | Status: DC

## 2020-05-09 MED ORDER — METHYLPREDNISOLONE 4 MG PO TABS
4.0000 mg | ORAL_TABLET | ORAL | 0 refills | Status: DC
Start: 2020-05-09 — End: 2020-06-10

## 2020-05-09 MED ORDER — VITAMIN D3 25 MCG PO TABS: 2000.0000 [IU] | ORAL_TABLET | Freq: Every day | ORAL | 0 refills | Status: DC

## 2020-05-09 MED ORDER — PAROXETINE HCL 20 MG PO TABS: 20.0000 mg | ORAL_TABLET | Freq: Every day | ORAL | 11 refills | Status: DC

## 2020-05-09 MED ORDER — PREGABALIN 150 MG PO CAPS: 150.0000 mg | ORAL_CAPSULE | Freq: Three times a day (TID) | ORAL | 0 refills | Status: DC

## 2020-05-09 MED ORDER — AMITRIPTYLINE HCL 10 MG PO TABS: 10.0000 mg | ORAL_TABLET | Freq: Every day | ORAL | 0 refills | Status: DC

## 2020-05-09 MED ORDER — ENOXAPARIN SODIUM 30 MG/0.3ML ~~LOC~~ SOLN: SUBCUTANEOUS | 0 refills | Status: DC

## 2020-05-09 MED ORDER — ACETAMINOPHEN 325 MG PO TABS: 650.0000 mg | ORAL_TABLET | Freq: Four times a day (QID) | ORAL | Status: DC | PRN

## 2020-05-09 MED ORDER — METHOCARBAMOL 500 MG PO TABS: 1000.0000 mg | ORAL_TABLET | Freq: Four times a day (QID) | ORAL | 0 refills | Status: DC

## 2020-05-09 MED ORDER — IBUPROFEN 400 MG PO TABS: 600.0000 mg | ORAL_TABLET | Freq: Four times a day (QID) | ORAL | Status: DC | PRN

## 2020-05-09 MED ORDER — ALBUTEROL SULFATE HFA 108 (90 BASE) MCG/ACT IN AERS: 1.0000 | INHALATION_SPRAY | Freq: Four times a day (QID) | RESPIRATORY_TRACT | 0 refills | Status: DC | PRN

## 2020-05-09 MED FILL — MELATONIN 3 MG TABS: 3 | 30 days supply | Qty: 30 | Fill #0

## 2020-05-09 MED FILL — METHOCARBAMOL 500 MG TABS: 500 | 15 days supply | Qty: 120 | Fill #0

## 2020-05-09 MED FILL — hydrOXYzine HCL 25 MG TABS: 25 | 2 days supply | Qty: 10 | Fill #0

## 2020-05-09 MED FILL — PREGABALIN 150 MG CAPS: 150 | 30 days supply | Qty: 90 | Fill #0

## 2020-05-09 MED FILL — FAMOTIDINE 20 MG TABS: 20 | 30 days supply | Qty: 60 | Fill #0

## 2020-05-09 MED FILL — diazePAM 5 MG TABS: 5 | 7 days supply | Qty: 30 | Fill #0

## 2020-05-09 MED FILL — VITAMIN D3 25 MCG TABS: 25 | 30 days supply | Qty: 60 | Fill #0

## 2020-05-09 MED FILL — oxyCODONE HCL 10 MG TABS: 10 | 5 days supply | Qty: 30 | Fill #0

## 2020-05-09 MED FILL — AMITRIPTYLINE HCL 10 MG TAB: 10 | 30 days supply | Qty: 30 | Fill #0

## 2020-05-09 MED FILL — methylPREDNISolone 4 MG dos: 4 | 23 days supply | Qty: 111 | Fill #0

## 2020-05-09 MED FILL — PARoxetine HCL 20 MG TABS: 20 | 30 days supply | Qty: 30 | Fill #0

## 2020-05-09 MED FILL — ENOXAPARIN SODIUM 30 MG/0.3: 30 | 6 days supply | Qty: 4 | Fill #0

## 2020-05-09 MED FILL — PROAIR HFA 90 MCG INHALER: 108 (90 BAS | 16 days supply | Qty: 9 | Fill #0

## 2020-05-09 NOTE — Progress Notes (Signed)
Occupational Therapy Session Note  Patient Details  Name: Marissa Smith MRN: [id number] Date of Birth: May 17, 1985  Today's Date: 05/09/2020 OT Individual Time: 6144-3154 OT Individual Time Calculation (min): 44 min    Short Term Goals: Week 2:  OT Short Term Goal 1 (Week 2): STG = LTGs due to remaining LOS  Skilled Therapeutic Interventions/Progress Updates:    Pt greeted seated in wc and agreeable to OT treatment session. Pt propelled wc to the sink and completed grooming tasks mod I. OT assist for braiding hair. Worked on wc propulsion in hallway as pt stated her rib pain was now under control with pain patches. Discussed dc plan and home modifications for safe BADL participation from wc level. Pt needed 1 rest breaks after wc propulsion 2/2 UE and LE fatigue. Pt propelled wc back to room and completed squat-pivot back to bed with supervision. Pt left semi-reclined in bed with bed alarm on, call bell in reach, and needs met.   Therapy Documentation Precautions:  Precautions Precautions: Fall Required Braces or Orthoses: Other Brace Other Brace: R CAM boot, L sling for comfort. Restrictions Weight Bearing Restrictions: Yes RUE Weight Bearing: Weight bearing as tolerated LUE Weight Bearing: Non weight bearing RLE Weight Bearing: Non weight bearing LLE Weight Bearing: Non weight bearing Other Position/Activity Restrictions: RLE WBAT transfers only Pain: Pain Assessment Pain Score: 5  Faces Pain Scale: Hurts a little bit Ribs- repositioned for comfot  Therapy/Group: Individual Therapy  Valma Cava 05/09/2020, 1:50 PM

## 2020-05-09 NOTE — Progress Notes (Signed)
Physical Therapy Session Note  Patient Details  Name: Marissa Smith MRN: [id number] Date of Birth: 1985-08-06  Today's Date: 05/09/2020 PT Individual Time: 1100-1153 and 1440-1518 PT Individual Time Calculation (min): 53 min and 38 min  Short Term Goals: Week 1:  PT Short Term Goal 1 (Week 1): pt will perform bed mobility with supervision PT Short Term Goal 1 - Progress (Week 1): Met PT Short Term Goal 2 (Week 1): pt will transfer bed<>chair with LRAD and CGA PT Short Term Goal 2 - Progress (Week 1): Met PT Short Term Goal 3 (Week 1): pt will transfer sit<>stand with LRAD and min A PT Short Term Goal 3 - Progress (Week 1): Met Week 2:  PT Short Term Goal 1 (Week 2): STG=LTG due to LOS  Skilled Therapeutic Interventions/Progress Updates:   Treatment Session 1: 1100-1153 53 min Received pt supine in bed, pt agreeable to therapy, and reported pain 9.5/10 in LLE and R ribs (premedicated). RN reported pt just received pain medication. Repositioning, rest breaks, and distraction done to reduce pain levels. Session with emphasis on functional mobility/transfers, generalized strengthening, dynamic standing balance/coordination, and improved activity tolerance. Donned bilateral ted hose total A and donned R Cam boot and LUE sling independently. Pt transferred bed<>WC squat<>pivot with supervision. Pt transported to ortho gym in [facility] total A due to increased rib pain. Worked on dynamic standing balance with emphasis on maintaining LLE/UE NWB precautions while performing the following activities on BITS: -Geoboard with emphasis on fine motor control, memory, and pattern making -Trail Making part B for 1 minute and 8 seconds with 2 errors with emphasis on visual scanning, fine motor control, and sequencing -Bell Cancellation test to assess visual scanning Pt required min cues to maintain LUE NWB precautions but pt reported using using LUE for balance and denied any increased pain. Pt transported to dayroom  in [facility] total A and transferred sit<>stand at rail in dayroom with close supervision x 4 trials while maintaining precautions and performed the following exercises: -L hip flexion 2x10 -L hip abduction 2x10 Pt transported back to room in [facility] total A and requested to return to bed. Lateral scoot WC<>bed with supervision. Concluded session with pt supine in bed with all needs within reach.   Treatment Session 2: 3790-2409 38 min Received pt supine in bed, pt agreeable to therapy, and reported pain 7/10 in LLE. RN notified and present to administer pain medication during session. Repositioning, rest breaks, and distraction done to reduce pain levels. Session with emphasis on functional mobility/transfers, generalized strengthening, and improved activity tolerance. Donned bilateral ted hose and L non-skid sock with total A and pt donned R CAM boot and LUE sling independently. Pt reported urge to use restroom and transferred bed<>WC via squat<>pivot with supervision and WC<>bedside commode via squat<>pivot with supervision. Pt able to perform clothing management and void with supervision. Pt sat in [place] and washed hands independently. Pt performed WC mobility 125f using RUE/LE and supervision to dayroom. Pt transferred WC<>mat via lateral scoot with supervision with pt able to set up transfer independently. Pt transferred sit<>supine and performed the following exercises with supervision and verbal cues for technique: -Tabletop heel taps x10 bilaterally -Tabletop straight leg drops x10 bilaterally  -R shoulder extension isometric into physioball with knees in hooklying 10x5 second isometric hold and 10x5 second isometric hold with knees in tabletop with emphasis on transverse abdominis engagement -RLE single leg bridge 2x10 -RLE single leg bridge on physioball 2x10 Pt transferred supine<>sitting EOB and transferred  back into WC with supervision. Radiology present to take pt for x-ray.    Therapy  Documentation Precautions:  Precautions Precautions: Fall Required Braces or Orthoses: Other Brace Other Brace: R CAM boot, L sling for comfort. Restrictions Weight Bearing Restrictions: Yes RUE Weight Bearing: Weight bearing as tolerated LUE Weight Bearing: Non weight bearing RLE Weight Bearing: Non weight bearing LLE Weight Bearing: Non weight bearing Other Position/Activity Restrictions: RLE WBAT transfers only  Therapy/Group: Individual Therapy Alfonse Alpers PT, DPT   05/09/2020, 7:29 AM
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
## 2020-05-09 NOTE — Progress Notes (Signed)
Physical Therapy Session Note  Patient Details  Name: Valeen Borys Spurlock MRN: [id number] Date of Birth: 1985/08/17  Today's Date: 05/09/2020 PT Individual Time: 1005-1100 PT Individual Time Calculation (min): 55 min   Short Term Goals: Week 2:  PT Short Term Goal 1 (Week 2): STG=LTG due to LOS  Skilled Therapeutic Interventions/Progress Updates: Pt presented in w/c with nsg present agreeable to therapy. Pt receiving pain meds but not immediate release therefore pt requesting more stretching intervention this session due to pain. Pt propelled to dayroom via hemi-technique and performed squat pivot transfer to mat with supervision. Pt participated in DF stretch with use of gait belt 20 sec x 5 with PTA providing cues for maintaining neutral alignment and minimizing inversion. Pt then performed SLR initially AA then progressing to AROM x 10 with x 5 performed with ankle pump to incorporate neural glide. Pt also performed supine active hamstring stretch x5. Rolled to sidelying and performed hip abd/add AROM x 10 with PTA providing cues for avoiding rolling and increased hip extension. Pt able to roll to prone and performed passive hip flexion stretch x 5 min then performed active hamstring curls x 10. Pt returned to sitting EOM and performed squat pivot transfer back to w/c in same manner as prior. Pt propelled back to room and remained in w/c at end of session with NT present and current needs met.      Therapy Documentation Precautions:  Precautions Precautions: Fall Required Braces or Orthoses: Other Brace Other Brace: R CAM boot, L sling for comfort. Restrictions Weight Bearing Restrictions: Yes RUE Weight Bearing: Weight bearing as tolerated LUE Weight Bearing: Non weight bearing RLE Weight Bearing: Non weight bearing LLE Weight Bearing: Non weight bearing Other Position/Activity Restrictions: RLE WBAT transfers only General:   Vital Signs:  Pain:   Mobility:   Locomotion :     Trunk/Postural Assessment :    Balance:   Exercises:   Other Treatments:      Therapy/Group: Individual Therapy  Christyn Gutkowski 05/09/2020, 4:19 PM

## 2020-05-09 NOTE — Progress Notes (Addendum)
Inpatient Rehabilitation Care Coordinator Discharge Note  The overall goal for the admission was met for:   Discharge location: Yes-HOME WITH MOM [place]  Length of Stay: No-16 DAYS-2 EXTRA DAYS DUE TO [place]  Discharge activity level: Yes-MOD/I [place]  Home/community participation: Yes  Services provided included: MD, RD, PT, OT, RN, CM, TR, Pharmacy, Neuropsych and SW  Financial Services: Medicaid and Other: MED PAY  Choices offered to/list presented to:YES  Follow-up services arranged: DME: [facility]-DROP-ARM BEDSIDE COMMODE, ROLLING WALKER, WHEELCHAIR AND TRANSFER BOARD and Patient/Family has no preference for HH/DME agencies  Comments (or additional information):WILL NEED FOLLOW UP OUTPATIENT THERAPIES ONCE CAN WB. PT AWARE AND AGREEABLE  Patient/Family verbalized understanding of follow-up arrangements: Yes  Individual responsible for coordination of the follow-up plan: PATIENT [phone]  Confirmed correct DME delivered: Elease Hashimoto 05/09/2020    Katrese Shell, Gardiner Rhyme

## 2020-05-10 ENCOUNTER — Other Ambulatory Visit (HOSPITAL_COMMUNITY): Payer: Self-pay | Admitting: Physician Assistant

## 2020-05-10 MED ORDER — DICLOFENAC SODIUM 1 % EX GEL: 2.0000 g | Freq: Four times a day (QID) | CUTANEOUS | 0 refills | Status: DC

## 2020-05-10 MED ORDER — DICLOFENAC SODIUM 1 % EX GEL
2.0000 g | Freq: Four times a day (QID) | CUTANEOUS | Status: DC
  Administered 2020-05-10: 2 g via TOPICAL
  Filled 2020-05-10: qty 100

## 2020-05-10 MED FILL — DICLOFENAC SODIUM 1 % GEL: 1 | 10 days supply | Qty: 200 | Fill #0

## 2020-05-10 NOTE — Progress Notes (Signed)
Patient discharged to home with mother and brother. Patient stated that she understood all discharge instructions. Cletis Media, LPN

## 2020-05-10 NOTE — Discharge Instructions (Signed)
Inpatient Rehab Discharge Instructions  Marissa Smith Discharge date and time: No discharge date for patient encounter.   Activities/Precautions/ Functional Status: Activity: Nonweightbearing left lower extremity with posterior hip precautions. Nonweightbearing left upper extremity. Weightbearing as tolerated right lower extremity transfers only with Cam boot Diet: regular diet Wound Care: Routine skin checks Functional status:  ___ No restrictions     ___ Walk up steps independently ___ 24/7 supervision/assistance   ___ Walk up steps with assistance ___ Intermittent supervision/assistance  ___ Bathe/dress independently ___ Walk with walker     _x__ Bathe/dress with assistance ___ Walk Independently    ___ Shower independently ___ Walk with assistance    ___ Shower with assistance ___ No alcohol     ___ Return to work/school ________  Special Instructions: No driving smoking or alcohol    COMMUNITY REFERRALS UPON DISCHARGE:    Home EXERCISE PROGRAM UNTIL SHE CAN WB ON HER LEGS THAN OUTPATIENT REHAB  Medical Equipment/Items Ordered:WHEELCHAIR, DROP-ARM BEDSIDE COMMODE, TUB BENCH AND 30 TRANSFER BOARD                                                 Agency/Supplier:[facility]  [phone]   My questions have been answered and I understand these instructions. I will adhere to these goals and the provided educational materials after my discharge from the hospital.  Patient/Caregiver Signature _______________________________ Date __________  Clinician Signature _______________________________________ Date __________  Please bring this form and your medication list with you to all your follow-up doctor's appointments.

## 2020-05-10 NOTE — Progress Notes (Signed)
Occupational Therapy Session Note  Patient Details  Name: Marissa Smith MRN: [id number] Date of Birth: 1985-12-01  Today's Date: 05/10/2020 OT Individual Time: 3762-8315 OT Individual Time Calculation (min): 53 min    Short Term Goals: Week 2:  OT Short Term Goal 1 (Week 2): STG = LTGs due to remaining LOS  Skilled Therapeutic Interventions/Progress Updates:    Pt in bed to start session requesting meds for left LE nerve pain.  She was able to donn her gripper sock over the LLE and then complete squat pivot transfer to the wheelchair from bed with supervision.  She then requested use of the toilet and completed squat pivot transfer over to the drop arm commode at the same level.  Supervision with lateral leans was needed for clothing management to push pants down.  She reports leaning on the grab bar on the right side to pull them up with slight sit to stand transition on the RLE.  Next, she was able to complete transfer back to the wheelchair with supervision and then roll herself down to the tub room for practice with tub shower transfers.  She completed transfer on and off of the tub bench at supervision level with min instructional cueing for wheelchair placement and technique.  She does require assist with rotating the left foot rest out of the way as well as for putting it back on the wheelchair.  Therapist provided soft elbow pad for the LUE during session as pt reports hitting her left elbow on the arm rest frequently with increased pain.  After donning this she reports that it feels better when bumping her elbow on the arm rest.  Finished session with brief education on meal prep with use of the wheelchair.  Discussed use of the counter tops for transporting food and larger items while pushing her wheelchair.   Also simulated heating something up on top of the stove and positioning herself with her right side next to the stove to increase ease of reaching the burners as well as the knobs for  controlling temperature without increased risk of burning herself.  She returned to the room where she waited for PT to come in for follow-up session.  Call button and phone in reach.    Therapy Documentation Precautions:  Precautions Precautions: Fall Required Braces or Orthoses: Other Brace Other Brace: R CAM boot, L sling for comfort. Restrictions Weight Bearing Restrictions: Yes RUE Weight Bearing: Weight bearing as tolerated LUE Weight Bearing: Non weight bearing RLE Weight Bearing: Non weight bearing (weightbearing for transfers only with cam boot in place) LLE Weight Bearing: Non weight bearing Other Position/Activity Restrictions: RLE WBAT transfers only  Pain: Pain Assessment Pain Scale: Faces Faces Pain Scale: Hurts even more Pain Type: Acute pain Pain Location: Foot Pain Orientation: Left Pain Descriptors / Indicators: Discomfort;Pins and needles Pain Onset: On-going Pain Intervention(s): Repositioned ADL: See Care Tool Section for some details of mobility and selfcare  Therapy/Group: Individual Therapy  Derin Matthes OTR/L 05/10/2020, 2:17 PM

## 2020-05-10 NOTE — Progress Notes (Addendum)
Patient ID: Marissa Smith, female   DOB: May 14, 1985, 35 y.o.   MRN: [id number]  Pt is asking to discharge after therapies today instead of tomorrow due to weather coming. MD to talk with pt and decide.  9:16 AM per MD pt can discharge home after therapies today. Team aware.

## 2020-05-10 NOTE — Progress Notes (Signed)
Physical Therapy Session Note  Patient Details  Name: Marissa Smith MRN: [id number] Date of Birth: 08/21/1985  Today's Date: 05/10/2020 PT Individual Time: 1586-8257 and 4935-5217  PT Individual Time Calculation (min): 27 min and 42 min  Short Term Goals: Week 1:  PT Short Term Goal 1 (Week 1): pt will perform bed mobility with supervision PT Short Term Goal 1 - Progress (Week 1): Met PT Short Term Goal 2 (Week 1): pt will transfer bed<>chair with LRAD and CGA PT Short Term Goal 2 - Progress (Week 1): Met PT Short Term Goal 3 (Week 1): pt will transfer sit<>stand with LRAD and min A PT Short Term Goal 3 - Progress (Week 1): Met Week 2:  PT Short Term Goal 1 (Week 2): STG=LTG due to LOS  Skilled Therapeutic Interventions/Progress Updates:   Treatment Session 1: 1015-1042 27 min Received pt sitting in WC, pt agreeable to therapy, and did not state pain level during session. Session with emphasis on discharge planning, functional mobility/transfers, generalized strengthening, dynamic sitting balance, and improved activity tolerance. Therapist adjusted pt's new WC and pt transferred WC<>WC squat<>pivot with mod I while maintaining WB precautions. PA present during session and pt received multiple phone calls and remains verbose, requiring increased time with all functional tasks. Educated pt on removable brake extenders for WC, otherwise pt with good understanding of WC parts. Doffed R CAM boot and applied bilateral ted hose for edema management and R non-skid sock with total A. Pt eager to D/C home this afternoon after therapies. Concluded session with pt sitting in [facility] with all needs within reach.   Treatment Session 2: 4715-9539 42 min Received pt sitting in WC, pt agreeable to therapy, and reported pain 7/10 in LLE (premedicated). Repositioning, rest breaks, and distraction done to reduce pain levels. Session with emphasis on discharge preparation, functional mobility/transfers, generalized  strengthening, dynamic sitting balance/coordinaiton, and improved activity tolerance. Therapist adjusted pt's elevating legrests for comfort as pt reports burning pins and needles sensation along plantar surface of LLE. Pt with questions regarding X-ray results. Therapist reviewed results with pt and pt appreciative. NT present to take vitals and pt's brother arrived during session. Therapist wrapped towel and co-band around L elevating legrest and pt reported improved nerve pain. Pt transferred WC<>bed mod I and sit<>supine independently and performed the following exercises with min cues for technique: -L SLR 2x10 -R sidelying L hip abduction 2x10 -R sidelying L clamshells 2x10 -R single leg bridge 2x12 Concluded session with pt sitting EOB with brother and mother present prepared to take pt home. NT made aware that pt is ready to D/C.   Therapy Documentation Precautions:  Precautions Precautions: Fall Required Braces or Orthoses: Other Brace Other Brace: R CAM boot, L sling for comfort. Restrictions Weight Bearing Restrictions: Yes RUE Weight Bearing: Weight bearing as tolerated LUE Weight Bearing: Weight bearing as tolerated RLE Weight Bearing: Weight bearing as tolerated LLE Weight Bearing: Non weight bearing Other Position/Activity Restrictions: RLE WBAT transfers only  Therapy/Group: Individual Therapy Alfonse Alpers PT, DPT   05/10/2020, 7:22 AM

## 2020-05-10 NOTE — Progress Notes (Signed)
Occupational Therapy Discharge Summary  Patient Details  Name: Marissa Smith MRN: [id number] Date of Birth: [birth year]   Patient has met 5 of 5 long term goals due to improved activity tolerance, improved balance, postural control, ability to compensate for deficits and improved awareness.  Patient to discharge at overall Supervision level.  Patient's care partner is independent to provide the necessary intermittent assistance at discharge.  No family present for any family education as family had had COVID and were unable to visit.  Pt able to direct care and has demonstrated awareness of positioning of w/c and able to adhere to WB precautions during transfers and self-care tasks.  Reasons goals not met: N/A  Recommendation:  Patient will possibly benefit from OPOT for elbow if needed once WB restrictions lifted.  MD to order if appropriate.  Equipment: wide drop arm BSC and tub bench  Reasons for discharge: treatment goals met and discharge from hospital  Patient/family agrees with progress made and goals achieved: Yes  OT Discharge Precautions/Restrictions  Precautions Precautions: Fall Required Braces or Orthoses: Other Brace Other Brace: R CAM boot, L sling for comfort. Restrictions Weight Bearing Restrictions: Yes RUE Weight Bearing: Weight bearing as tolerated LUE Weight Bearing: Non weight bearing RLE Weight Bearing: Weight bearing as tolerated LLE Weight Bearing: Non weight bearing Other Position/Activity Restrictions: RLE WBAT transfers only Pain Pain Assessment Pain Score: 1  ADL ADL Eating: Independent Where Assessed-Eating: Edge of bed Grooming: Modified independent Where Assessed-Grooming: Sitting at sink Upper Body Bathing: Modified independent Where Assessed-Upper Body Bathing: Shower Lower Body Bathing: Modified independent Where Assessed-Lower Body Bathing: Shower Upper Body Dressing: Modified independent (Device) Where Assessed-Upper Body Dressing:  Wheelchair Lower Body Dressing: Supervision/safety Where Assessed-Lower Body Dressing: Sitting at sink,Standing at sink Toileting: Supervision/safety Where Assessed-Toileting: Glass blower/designer: Distant supervision Armed forces technical officer Method: Engineer, water: Bedside commode,Grab bars Social research officer, government: Distant supervision Social research officer, government Method: Education officer, environmental: Gaffer Baseline Vision/History: No visual deficits Patient Visual Report: No change from baseline Vision Assessment?: No apparent visual deficits Perception  Perception: Within Functional Limits Praxis Praxis: Intact Cognition Overall Cognitive Status: Within Functional Limits for tasks assessed Arousal/Alertness: Awake/alert Orientation Level: Oriented X4 Memory: Appears intact Awareness: Appears intact Problem Solving: Appears intact Safety/Judgment: Appears intact Sensation Sensation Light Touch: Impaired by gross assessment Proprioception: Impaired by gross assessment Additional Comments: pt reports pins and needles sensation along plantar surface of L foot Coordination Gross Motor Movements are Fluid and Coordinated: Yes Fine Motor Movements are Fluid and Coordinated: No Coordination and Movement Description: mild uncoordination due to LUE/LE NWB precautions and RLE WBAT for transfers only Finger Nose Finger Test: WFL on RUE decreased ROM on L UE but improvements since eval Motor  Motor Motor: Within Functional Limits Motor - Skilled Clinical Observations: mild uncoordination due to LUE/LE NWB precautions and RLE WBAT for transfers only Mobility  Bed Mobility Bed Mobility: Rolling Right;Rolling Left;Sit to Supine;Supine to Sit Rolling Right: Independent Rolling Left: Independent Supine to Sit: Independent Sit to Supine: Independent Transfers Sit to Stand: Supervision/Verbal cueing Stand to Sit: Supervision/Verbal cueing   Trunk/Postural Assessment  Cervical Assessment Cervical Assessment: Within Functional Limits Thoracic Assessment Thoracic Assessment: Within Functional Limits Lumbar Assessment Lumbar Assessment: Within Functional Limits Postural Control Postural Control: Deficits on evaluation  Balance Balance Balance Assessed: Yes Static Sitting Balance Static Sitting - Balance Support: Feet supported;Right upper extremity supported Static Sitting - Level of Assistance: 7: Independent Dynamic Sitting Balance Dynamic Sitting -  Balance Support: Feet supported;Right upper extremity supported Dynamic Sitting - Level of Assistance: 7: Independent Static Standing Balance Static Standing - Balance Support: Right upper extremity supported Static Standing - Level of Assistance: 5: Stand by assistance (supervision) Extremity/Trunk Assessment RUE Assessment RUE Assessment: Within Functional Limits LUE Assessment LUE Assessment: Exceptions to [facility] General Strength Comments: not tested, NWB, sling for comfort due to ulnar fracture   HOXIE, [facility] 05/10/2020, 10:56 AM

## 2020-05-10 NOTE — Plan of Care (Signed)
  Problem: Consults Goal: Skin Care Protocol Initiated - if Braden Score 18 or less Description: If consults are not indicated, leave blank or document N/A Outcome: Completed/Met   Problem: RH BOWEL ELIMINATION Goal: RH STG MANAGE BOWEL WITH ASSISTANCE Description: STG Manage Bowel with mod I Assistance. Outcome: Completed/Met   Problem: RH BLADDER ELIMINATION Goal: RH STG MANAGE BLADDER WITH ASSISTANCE Description: STG Manage Bladder With mod I Assistance Outcome: Completed/Met Goal: RH STG MANAGE BLADDER WITH MEDICATION WITH ASSISTANCE Description: STG Manage Bladder With Medication With mod I Assistance. Outcome: Completed/Met   Problem: RH SKIN INTEGRITY Goal: RH STG SKIN FREE OF INFECTION/BREAKDOWN Outcome: Completed/Met Goal: RH STG ABLE TO PERFORM INCISION/WOUND CARE W/ASSISTANCE Description: STG Able To Perform Incision/Wound Care With mod I Assistance. Outcome: Completed/Met   Problem: RH SAFETY Goal: RH STG ADHERE TO SAFETY PRECAUTIONS W/ASSISTANCE/DEVICE Description: STG Adhere to Safety Precautions With Assistance/Device. Outcome: Completed/Met   Problem: RH PAIN MANAGEMENT Goal: RH STG PAIN MANAGED AT OR BELOW PT'S PAIN GOAL Description: Pain scale <4/10 Outcome: Completed/Met   Problem: RH KNOWLEDGE DEFICIT GENERAL Goal: RH STG INCREASE KNOWLEDGE OF SELF CARE AFTER HOSPITALIZATION Description: Patient will acknowledge measures for safety at home prior to discharge Outcome: Completed/Met
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
## 2020-05-10 NOTE — Progress Notes (Signed)
Physical Therapy Discharge Summary  Patient Details  Name: Marissa Smith MRN: [id number] Date of Birth: 11/05/1985  Patient has met 7 of 7 long term goals due to improved activity tolerance, improved balance, improved postural control, increased strength, increased range of motion, decreased pain, improved awareness and improved coordination. Patient to discharge at a wheelchair level Modified Independent. Pt's family did not attend family education training as pt is at a Mod I level and demonstrates excellent safety awareness and understanding of WB precautions. Pt reports she will have 24/7 supervision/assist from one of her family members upon discharge.   All goals met  Recommendation:  Patient will benefit from ongoing skilled PT services in outpatient setting when able to continue to advance safe functional mobility, address ongoing impairments in transfers, generalized strengthening, dynamic standing balance/coordination, gait training, endurance, and to minimize fall risk.  Equipment: 16x16 manual WC and slideboard  Reasons for discharge: treatment goals met  Patient/family agrees with progress made and goals achieved: Yes  PT Discharge Precautions/Restrictions Precautions Precautions: Fall Required Braces or Orthoses: Other Brace Other Brace: R CAM boot, L sling for comfort. Restrictions Weight Bearing Restrictions: Yes RUE Weight Bearing: Weight bearing as tolerated LUE Weight Bearing: Non weight bearing RLE Weight Bearing: Weight bearing as tolerated LLE Weight Bearing: Non weight bearing Other Position/Activity Restrictions: RLE WBAT transfers only Cognition Overall Cognitive Status: Within Functional Limits for tasks assessed Arousal/Alertness: Awake/alert Orientation Level: Oriented X4 Memory: Appears intact Awareness: Appears intact Problem Solving: Appears intact Safety/Judgment: Appears intact Sensation Sensation Light Touch: Impaired by gross  assessment Proprioception: Impaired by gross assessment Additional Comments: pt reports pins and needles sensation along plantar surface of L foot Coordination Gross Motor Movements are Fluid and Coordinated: Yes Fine Motor Movements are Fluid and Coordinated: No Coordination and Movement Description: mild uncoordination due to LUE/LE NWB precautions and RLE WBAT for transfers only Finger Nose Finger Test: WFL on RUE decreased ROM on L UE but improvements since eval Motor  Motor Motor: Within Functional Limits Motor - Skilled Clinical Observations: mild uncoordination due to LUE/LE NWB precautions and RLE WBAT for transfers only  Mobility Bed Mobility Bed Mobility: Rolling Right;Rolling Left;Sit to Supine;Supine to Sit Rolling Right: Independent Rolling Left: Independent Supine to Sit: Independent Sit to Supine: Independent Transfers Transfers: Sit to Stand;Stand to Sit;Squat Pivot Transfers;Lateral/Scoot Transfers Sit to Stand: Supervision/Verbal cueing Stand to Sit: Supervision/Verbal cueing Squat Pivot Transfers: Independent with assistive device Lateral/Scoot Transfers: Independent with assistive device Transfer (Assistive device): None Locomotion  Gait Ambulation: No Gait Gait: No Stairs / Additional Locomotion Stairs: No Wheelchair Mobility Wheelchair Mobility: Yes Wheelchair Assistance: Independent with Camera operator: Right upper extremity;Right lower extremity Wheelchair Parts Management: Independent Distance: >394f  Trunk/Postural Assessment  Cervical Assessment Cervical Assessment: Within Functional Limits Thoracic Assessment Thoracic Assessment: Within Functional Limits Lumbar Assessment Lumbar Assessment: Within Functional Limits Postural Control Postural Control: Deficits on evaluation  Balance Balance Balance Assessed: Yes Static Sitting Balance Static Sitting - Balance Support: Feet supported;Right upper extremity  supported Static Sitting - Level of Assistance: 7: Independent Dynamic Sitting Balance Dynamic Sitting - Balance Support: Feet supported;Right upper extremity supported Dynamic Sitting - Level of Assistance: 7: Independent Static Standing Balance Static Standing - Balance Support: Right upper extremity supported Static Standing - Level of Assistance: 5: Stand by assistance (supervision) Extremity Assessment  RLE Assessment RLE Assessment: Ex[facility] to [facility]: grossly generalized to 4/5 LLE Assessment LLE Assessment: Exceptions to [facility]: grossly generalized to 4-/5 (  except hip abduction 3+/5)  Alfonse Alpers PT, DPT  05/10/2020, 7:36 AM

## 2020-05-13 ENCOUNTER — Telehealth (HOSPITAL_COMMUNITY): Payer: Self-pay | Admitting: Pharmacist

## 2020-05-13 NOTE — Telephone Encounter (Signed)
Transitions of Care Pharmacy  ° °Call attempted for a pharmacy transitions of care follow-up. HIPAA appropriate voicemail was left with call back information provided.  ° °Call attempt #1. Will follow-up in 2-3 days.  °  °
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
## 2020-05-21 ENCOUNTER — Ambulatory Visit
Admission: RE | Admit: 2020-05-21 | Discharge: 2020-05-21 | Disposition: A | Discharge status: Home / Self Care | Payer: Medicaid Other | Source: Ambulatory Visit | Attending: General Surgery | Admitting: General Surgery

## 2020-05-21 ENCOUNTER — Other Ambulatory Visit: Payer: Self-pay | Admitting: General Surgery

## 2020-05-21 DIAGNOSIS — S270XXA Traumatic pneumothorax, initial encounter: Secondary | ICD-10-CM

## 2020-06-06 ENCOUNTER — Ambulatory Visit: Payer: Medicaid Other | Admitting: Physical Therapy

## 2020-06-10 ENCOUNTER — Encounter: Payer: Self-pay | Admitting: Physical Medicine & Rehabilitation

## 2020-06-10 ENCOUNTER — Encounter: Payer: Medicaid Other | Attending: Physical Medicine & Rehabilitation | Admitting: Physical Medicine & Rehabilitation

## 2020-06-10 ENCOUNTER — Other Ambulatory Visit: Payer: Self-pay

## 2020-06-10 VITALS — BP 115/68 | HR 79 | Temp 98.6°F | Ht 65.0 in | Wt 180.0 lb

## 2020-06-10 DIAGNOSIS — T07XXXA Unspecified multiple injuries, initial encounter: Secondary | ICD-10-CM | POA: Insufficient documentation

## 2020-06-10 DIAGNOSIS — G8918 Other acute postprocedural pain: Secondary | ICD-10-CM | POA: Diagnosis present

## 2020-06-10 DIAGNOSIS — R269 Unspecified abnormalities of gait and mobility: Secondary | ICD-10-CM | POA: Diagnosis present

## 2020-06-10 DIAGNOSIS — M792 Neuralgia and neuritis, unspecified: Secondary | ICD-10-CM | POA: Insufficient documentation

## 2020-06-10 DIAGNOSIS — K5903 Drug induced constipation: Secondary | ICD-10-CM | POA: Insufficient documentation

## 2020-06-10 HISTORY — DX: Unspecified abnormalities of gait and mobility: R26.9

## 2020-06-10 MED ORDER — AMITRIPTYLINE HCL 10 MG PO TABS: 10.0000 mg | ORAL_TABLET | Freq: Every day | ORAL | 1 refills | Status: DC

## 2020-06-10 MED ORDER — PREGABALIN 150 MG PO CAPS: 150.0000 mg | ORAL_CAPSULE | Freq: Three times a day (TID) | ORAL | 1 refills | Status: DC
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
## 2020-06-18 ENCOUNTER — Telehealth: Payer: Self-pay
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
## 2020-06-18 NOTE — Telephone Encounter (Signed)
-  Marissa Smith called:   Someone has taken her Pregabalin 150 MG Capsules. She has only 22 capsules left. Next refill is due on 07/08/20.  Will you write a bridge Rx to cover there missing medications?  Call back ph [phone].

## 2020-06-18 NOTE — Telephone Encounter (Signed)
Patient advised.

## 2020-06-27 ENCOUNTER — Other Ambulatory Visit: Payer: Self-pay | Admitting: Student

## 2020-06-27 DIAGNOSIS — S32402D Unspecified fracture of left acetabulum, subsequent encounter for fracture with routine healing: Secondary | ICD-10-CM

## 2020-07-11 ENCOUNTER — Other Ambulatory Visit: Payer: Medicaid Other

## 2020-07-15 ENCOUNTER — Encounter: Payer: Medicaid Other | Attending: Physical Medicine & Rehabilitation | Admitting: Physical Medicine & Rehabilitation

## 2020-07-15 DIAGNOSIS — M792 Neuralgia and neuritis, unspecified: Secondary | ICD-10-CM | POA: Insufficient documentation

## 2020-07-15 DIAGNOSIS — T07XXXA Unspecified multiple injuries, initial encounter: Secondary | ICD-10-CM | POA: Insufficient documentation

## 2020-07-15 DIAGNOSIS — K5903 Drug induced constipation: Secondary | ICD-10-CM | POA: Insufficient documentation

## 2020-07-15 DIAGNOSIS — G8918 Other acute postprocedural pain: Secondary | ICD-10-CM | POA: Insufficient documentation

## 2020-07-15 DIAGNOSIS — R269 Unspecified abnormalities of gait and mobility: Secondary | ICD-10-CM | POA: Insufficient documentation

## 2020-07-17 ENCOUNTER — Other Ambulatory Visit: Payer: Medicaid Other

## 2020-07-25 ENCOUNTER — Ambulatory Visit
Admission: RE | Admit: 2020-07-25 | Discharge: 2020-07-25 | Disposition: A | Discharge status: Home / Self Care | Payer: Medicaid Other | Source: Ambulatory Visit | Attending: Student | Admitting: Student

## 2020-07-25 DIAGNOSIS — S32402D Unspecified fracture of left acetabulum, subsequent encounter for fracture with routine healing: Secondary | ICD-10-CM

## 2020-08-03 ENCOUNTER — Other Ambulatory Visit: Payer: Self-pay | Admitting: Physical Medicine & Rehabilitation

## 2020-09-05 ENCOUNTER — Other Ambulatory Visit: Payer: Self-pay | Admitting: Student

## 2020-09-05 DIAGNOSIS — S32402D Unspecified fracture of left acetabulum, subsequent encounter for fracture with routine healing: Secondary | ICD-10-CM

## 2020-09-30 ENCOUNTER — Other Ambulatory Visit: Payer: Self-pay | Admitting: Physical Medicine & Rehabilitation

## 2020-10-08 ENCOUNTER — Ambulatory Visit
Admission: RE | Admit: 2020-10-08 | Discharge: 2020-10-08 | Disposition: A | Discharge status: Home / Self Care | Payer: Medicaid Other | Source: Ambulatory Visit | Attending: Student | Admitting: Student

## 2020-10-08 DIAGNOSIS — S32402D Unspecified fracture of left acetabulum, subsequent encounter for fracture with routine healing: Secondary | ICD-10-CM

## 2021-01-16 NOTE — Progress Notes (Signed)
Sent message, via epic in basket, requesting orders in epic from surgeon.  

## 2021-01-20 NOTE — Progress Notes (Addendum)
COVID swab appointment: N/A  COVID Vaccine Completed:  Yes x3 Date COVID Vaccine completed: Has received booster: Yes x1 05-05-20 COVID vaccine manufacturer: Pfizer      Date of COVID positive in last 90 days:  N/A  PCP - [facility] Cardiologist - N/A  Chest x-ray - 05-21-20 Epic EKG - 02-13-20 Epic Stress Test -  N/A ECHO -  N/A Cardiac Cath -  N/A Pacemaker/ICD device last checked: Spinal Cord Stimulator:  Sleep Study -  N/A CPAP -   Fasting Blood Sugar -  N/A Checks Blood Sugar _____ times a day  Blood Thinner Instructions: N/A Aspirin Instructions: Last Dose:  Activity level:  Can go up a flight of stairs and perform activities of daily living without stopping and without symptoms of chest pain or shortness of breath.  Some limitations due to hip pain   Anesthesia review:  N/A  Patient denies shortness of breath, fever, cough and chest pain at PAT appointment   Patient verbalized understanding of instructions that were given to them at the PAT appointment. Patient was also instructed that they will need to review over the PAT instructions again at home before surgery.

## 2021-01-20 NOTE — Patient Instructions (Addendum)
DUE TO COVID-19 ONLY ONE VISITOR IS ALLOWED TO COME WITH YOU AND STAY IN THE WAITING ROOM ONLY DURING PRE OP AND PROCEDURE.   **NO VISITORS ARE ALLOWED IN THE SHORT STAY AREA OR RECOVERY ROOM!!**        Your procedure is scheduled on:  Thursday, 01-30-21   Report to [facility] Main  Entrance     Report to admitting at 7:45 AM   Call this number if you have problems the morning of surgery [phone]   Do not eat food :After Midnight.   [place] have liquids until 7:30 AM day of surgery  CLEAR LIQUID DIET  Foods Allowed                                                                     Foods Excluded  Water, Black Coffee (no milk/no creamer) and tea, regular and decaf                              liquids that you cannot  Plain Jell-O in any flavor  (No red)                         see through such as: Fruit ices (not with fruit pulp)                                 milk, soups, orange juice  Iced Popsicles (No red)                                    All solid food                             Apple juices Sports drinks like Gatorade (No red) Lightly seasoned clear broth or consume(fat free) Sugar   Complete one Ensure drink the morning of surgery at 7:30 AM  the day of surgery.       The day of surgery:  Drink ONE (1) Pre-Surgery Clear Ensure the morning of surgery. Drink in one sitting. Do not sip.  This drink was given to you during your hospital  pre-op appointment visit. Nothing else to drink after completing the Pre-Surgery Clear Ensure           If you have questions, please contact your surgeon's office.     Oral Hygiene is also important to reduce your risk of infection.                                    Remember - BRUSH YOUR TEETH THE MORNING OF SURGERY WITH YOUR REGULAR TOOTHPASTE   Do NOT smoke after Midnight  Take these medicines the morning of surgery with A SIP OF WATER:  Acetaminophen, Loratadine, Albuterol inhaler (bring with you day of  surgery)   Stop all vitamins and herbal supplements a week before surgery  You Maradiaga not have any metal on your body including hair pins, jewelry, and body piercing             Do not wear make-up, lotions, powders, perfumes or deodorant  Do not wear nail polish including gel and S&S, artificial/acrylic nails, or any other type of covering on natural nails including finger and toenails. If you have artificial nails, gel coating, etc. that needs to be removed by a nail salon please have this removed prior to surgery or surgery B[place] need to be canceled/ delayed if the surgeon/ anesthesia feels like they are unable to be safely monitored.   Do not shave  48 hours prior to surgery.           Do not bring valuables to the hospital. [facility] IS NOT RESPONSIBLE FOR VALUABLES.   Contacts, dentures or bridgework Yonts not be worn into surgery.   Patients discharged the day of surgery will not be allowed to drive home.  Please read over the following fact sheets you were given: IF YOU HAVE QUESTIONS ABOUT YOUR PRE OP INSTRUCTIONS PLEASE CALL [phone] [facility] - Preparing for Surgery Before surgery, you can play an important role.  Because skin is not sterile, your skin needs to be as free of germs as possible.  You can reduce the number of germs on your skin by washing with CHG (chlorahexidine gluconate) soap before surgery.  CHG is an antiseptic cleaner which kills germs and bonds with the skin to continue killing germs even after washing. Please DO NOT use if you have an allergy to CHG or antibacterial soaps.  If your skin becomes reddened/irritated stop using the CHG and inform your nurse when you arrive at Short Stay. Do not shave (including legs and underarms) for at least 48 hours prior to the first CHG shower.  You Chavarin shave your face/neck.  Please follow these instructions carefully:  1.  Shower with CHG Soap the night before surgery and the  morning of surgery.  2.   If you choose to wash your hair, wash your hair first as usual with your normal  shampoo.  3.  After you shampoo, rinse your hair and body thoroughly to remove the shampoo.                             4.  Use CHG as you would any other liquid soap.  You can apply chg directly to the skin and wash.  Gently with a scrungie or clean washcloth.  5.  Apply the CHG Soap to your body ONLY FROM THE NECK DOWN.   Do   not use on face/ open                           Wound or open sores. Avoid contact with eyes, ears mouth and   genitals (private parts).                       Wash face,  Genitals (private parts) with your normal soap.             6.  Wash thoroughly, paying special attention to the area where your    surgery  will be performed.  7.  Thoroughly rinse your body with warm water from the neck down.  8.  DO NOT shower/wash with your normal soap  after using and rinsing off the CHG Soap.                9.  Pat yourself dry with a clean towel.            10.  Wear clean pajamas.            11.  Place clean sheets on your bed the night of your first shower and do not  sleep with pets. Day of Surgery : Do not apply any lotions/deodorants the morning of surgery.  Please wear clean clothes to the hospital/surgery center.  FAILURE TO FOLLOW THESE INSTRUCTIONS [place] RESULT IN THE CANCELLATION OF YOUR SURGERY  PATIENT SIGNATURE_________________________________  NURSE SIGNATURE__________________________________  ________________________________________________________________________    Rogelia Mire  An incentive spirometer is a tool that can help keep your lungs clear and active. This tool measures how well you are filling your lungs with each breath. Taking long deep breaths Stokes help reverse or decrease the chance of developing breathing (pulmonary) problems (especially infection) following: A long period of time when you are unable to move or be active. BEFORE THE PROCEDURE  If the  spirometer includes an indicator to show your best effort, your nurse or respiratory therapist will set it to a desired goal. If possible, sit up straight or lean slightly forward. Try not to slouch. Hold the incentive spirometer in an upright position. INSTRUCTIONS FOR USE  Sit on the edge of your bed if possible, or sit up as far as you can in bed or on a chair. Hold the incentive spirometer in an upright position. Breathe out normally. Place the mouthpiece in your mouth and seal your lips tightly around it. Breathe in slowly and as deeply as possible, raising the piston or the ball toward the top of the column. Hold your breath for 3-5 seconds or for as long as possible. Allow the piston or ball to fall to the bottom of the column. Remove the mouthpiece from your mouth and breathe out normally. Rest for a few seconds and repeat Steps 1 through 7 at least 10 times every 1-2 hours when you are awake. Take your time and take a few normal breaths between deep breaths. The spirometer Wendel include an indicator to show your best effort. Use the indicator as a goal to work toward during each repetition. After each set of 10 deep breaths, practice coughing to be sure your lungs are clear. If you have an incision (the cut made at the time of surgery), support your incision when coughing by placing a pillow or rolled up towels firmly against it. Once you are able to get out of bed, walk around indoors and cough well. You [place]hall stop using the incentive spirometer when instructed by your caregiver.  RISKS AND COMPLICATIONS Take your time so you do not get dizzy or light-headed. If you are in pain, you Antillon need to take or ask for pain medication before doing incentive spirometry. It is harder to take a deep breath if you are having pain. AFTER USE Rest and breathe slowly and easily. It can be helpful to keep track of a log of your progress. Your caregiver can provide you with a simple table to help with  this. If you are using the spirometer at home, follow these instructions: SEEK MEDICAL CARE IF:  You are having difficultly using the spirometer. You have trouble using the spirometer as often as instructed. Your pain medication is not giving enough relief while using  the spirometer. You develop fever of 100.5 F (38.1 C) or higher. SEEK IMMEDIATE MEDICAL CARE IF:  You cough up bloody sputum that had not been present before. You develop fever of 102 F (38.9 C) or greater. You develop worsening pain at or near the incision site. MAKE SURE YOU:  Understand these instructions. Will watch your condition. Will get help right away if you are not doing well or get worse. Document Released: 08/10/2006 Document Revised: 06/22/2011 Document Reviewed: 10/11/2006 Exit[employer] Patient Information 2014 ExitCare, [place].   ________________________________________________________________________  WHAT IS A BLOOD TRANSFUSION? Blood Transfusion Information  A transfusion is the replacement of blood or some of its parts. Blood is made up of multiple cells which provide different functions. Red blood cells carry oxygen and are used for blood loss replacement. White blood cells fight against infection. Platelets control bleeding. Plasma helps clot blood. Other blood products are available for specialized needs, such as hemophilia or other clotting disorders. BEFORE THE TRANSFUSION  Who gives blood for transfusions?  Healthy volunteers who are fully evaluated to make sure their blood is safe. This is blood bank blood. Transfusion therapy is the safest it has ever been in the practice of medicine. Before blood is taken from a donor, a complete history is taken to make sure that person has no history of diseases nor engages in risky social behavior (examples are intravenous drug use or sexual activity with multiple partners). The donor's travel history is screened to minimize risk of transmitting infections,  such as malaria. The donated blood is tested for signs of infectious diseases, such as HIV and hepatitis. The blood is then tested to be sure it is compatible with you in order to minimize the chance of a transfusion reaction. If you or a relative donates blood, this is often done in anticipation of surgery and is not appropriate for emergency situations. It takes many days to process the donated blood. RISKS AND COMPLICATIONS Although transfusion therapy is very safe and saves many lives, the main dangers of transfusion include:  Getting an infectious disease. Developing a transfusion reaction. This is an allergic reaction to something in the blood you were given. Every precaution is taken to prevent this. The decision to have a blood transfusion has been considered carefully by your caregiver before blood is given. Blood is not given unless the benefits outweigh the risks. AFTER THE TRANSFUSION Right after receiving a blood transfusion, you will usually feel much better and more energetic. This is especially true if your red blood cells have gotten low (anemic). The transfusion raises the level of the red blood cells which carry oxygen, and this usually causes an energy increase. The nurse administering the transfusion will monitor you carefully for complications. HOME CARE INSTRUCTIONS  No special instructions are needed after a transfusion. You Stantz find your energy is better. Speak with your caregiver about any limitations on activity for underlying diseases you [place] have. SEEK MEDICAL CARE IF:  Your condition is not improving after your transfusion. You develop redness or irritation at the intravenous (IV) site. SEEK IMMEDIATE MEDICAL CARE IF:  Any of the following symptoms occur over the next 12 hours: Shaking chills. You have a temperature by mouth above 102 F (38.9 C), not controlled by medicine. Chest, back, or muscle pain. People around you feel you are not acting correctly or are  confused. Shortness of breath or difficulty breathing. Dizziness and fainting. You get a rash or develop hives. You have a decrease in urine  output. Your urine turns a dark color or changes to pink, red, or brown. Any of the following symptoms occur over the next 10 days: You have a temperature by mouth above 102 F (38.9 C), not controlled by medicine. Shortness of breath. Weakness after normal activity. The white part of the eye turns yellow (jaundice). You have a decrease in the amount of urine or are urinating less often. Your urine turns a dark color or changes to pink, red, or brown. Document Released: 03/27/2000 Document Revised: 06/22/2011 Document Reviewed: 11/14/2007 [facility] Patient Information 2014 [place], [place].  _______________________________________________________________________

## 2021-01-21 ENCOUNTER — Ambulatory Visit: Payer: Self-pay | Admitting: Student

## 2021-01-21 DIAGNOSIS — M1652 Unilateral post-traumatic osteoarthritis, left hip: Secondary | ICD-10-CM

## 2021-01-22 ENCOUNTER — Ambulatory Visit: Payer: Self-pay | Admitting: Student
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
## 2021-01-23 ENCOUNTER — Encounter (HOSPITAL_COMMUNITY)
Admission: RE | Admit: 2021-01-23 | Discharge: 2021-01-23 | Disposition: A | Discharge status: Home / Self Care | Payer: Medicaid Other | Source: Ambulatory Visit | Attending: Orthopedic Surgery | Admitting: Orthopedic Surgery

## 2021-01-23 ENCOUNTER — Encounter (HOSPITAL_COMMUNITY): Payer: Self-pay

## 2021-01-23 ENCOUNTER — Other Ambulatory Visit: Payer: Self-pay

## 2021-01-23 DIAGNOSIS — M1652 Unilateral post-traumatic osteoarthritis, left hip: Secondary | ICD-10-CM | POA: Diagnosis not present

## 2021-01-23 DIAGNOSIS — Z01812 Encounter for preprocedural laboratory examination: Secondary | ICD-10-CM | POA: Insufficient documentation

## 2021-01-23 HISTORY — DX: Pneumonia, unspecified organism: J18.9

## 2021-01-23 HISTORY — DX: Unspecified osteoarthritis, unspecified site: M19.90

## 2021-01-23 HISTORY — DX: Gastro-esophageal reflux disease without esophagitis: K21.9

## 2021-01-23 HISTORY — DX: Anxiety disorder, unspecified: F41.9

## 2021-01-23 LAB — CBC
HCT: 43.9 % (ref 36.0–46.0)
Hemoglobin: 14.3 g/dL (ref 12.0–15.0)
MCH: 30.5 pg (ref 26.0–34.0)
MCHC: 32.6 g/dL (ref 30.0–36.0)
MCV: 93.6 fL (ref 80.0–100.0)
Platelets: 307 10*3/uL (ref 150–400)
RBC: 4.69 MIL/uL (ref 3.87–5.11)
RDW: 13.6 % (ref 11.5–15.5)
WBC: 5.6 10*3/uL (ref 4.0–10.5)
nRBC: 0 % (ref 0.0–0.2)

## 2021-01-23 LAB — SURGICAL PCR SCREEN
MRSA, PCR: NEGATIVE
Staphylococcus aureus: NEGATIVE

## 2021-01-23 LAB — COMPREHENSIVE METABOLIC PANEL
ALT: 16 U/L (ref 0–44)
AST: 22 U/L (ref 15–41)
Albumin: 4 g/dL (ref 3.5–5.0)
Alkaline Phosphatase: 59 U/L (ref 38–126)
Anion gap: 8 (ref 5–15)
BUN: 11 mg/dL (ref 6–20)
CO2: 25 mmol/L (ref 22–32)
Calcium: 9.3 mg/dL (ref 8.9–10.3)
Chloride: 103 mmol/L (ref 98–111)
Creatinine, Ser: 0.5 mg/dL (ref 0.44–1.00)
GFR, Estimated: >60 mL/min (ref >60)
Glucose, Bld: 103 mg/dL — ABNORMAL HIGH (ref 70–99)
Potassium: 5.1 mmol/L (ref 3.5–5.1)
Sodium: 136 mmol/L (ref 135–145)
Total Bilirubin: 0.4 mg/dL (ref 0.3–1.2)
Total Protein: 7.3 g/dL (ref 6.5–8.1)

## 2021-01-23 LAB — PROTIME-INR
INR: 0.9 (ref 0.8–1.2)
Prothrombin Time: 11.9 seconds (ref 11.4–15.2)

## 2021-01-30 ENCOUNTER — Ambulatory Visit (HOSPITAL_COMMUNITY): Payer: Medicaid Other | Admitting: Anesthesiology

## 2021-01-30 ENCOUNTER — Ambulatory Visit (HOSPITAL_COMMUNITY)
Admission: RE | Admit: 2021-01-30 | Discharge: 2021-01-30 | Disposition: A | Discharge status: Home / Self Care | Payer: Medicaid Other | Source: Ambulatory Visit | Attending: Orthopedic Surgery | Admitting: Orthopedic Surgery

## 2021-01-30 ENCOUNTER — Encounter (HOSPITAL_COMMUNITY): Payer: Self-pay | Admitting: Orthopedic Surgery

## 2021-01-30 ENCOUNTER — Ambulatory Visit (HOSPITAL_COMMUNITY): Payer: Medicaid Other

## 2021-01-30 ENCOUNTER — Encounter (HOSPITAL_COMMUNITY)
Admission: RE | Disposition: A | Discharge status: Home / Self Care | Payer: Self-pay | Source: Ambulatory Visit | Attending: Orthopedic Surgery

## 2021-01-30 DIAGNOSIS — M1612 Unilateral primary osteoarthritis, left hip: Secondary | ICD-10-CM | POA: Diagnosis not present

## 2021-01-30 DIAGNOSIS — Z09 Encounter for follow-up examination after completed treatment for conditions other than malignant neoplasm: Secondary | ICD-10-CM

## 2021-01-30 DIAGNOSIS — S82122A Displaced fracture of lateral condyle of left tibia, initial encounter for closed fracture: Secondary | ICD-10-CM

## 2021-01-30 DIAGNOSIS — F17210 Nicotine dependence, cigarettes, uncomplicated: Secondary | ICD-10-CM | POA: Diagnosis not present

## 2021-01-30 DIAGNOSIS — M1652 Unilateral post-traumatic osteoarthritis, left hip: Secondary | ICD-10-CM

## 2021-01-30 HISTORY — PX: TOTAL HIP ARTHROPLASTY: SHX124

## 2021-01-30 LAB — TYPE AND SCREEN
ABO/RH(D): O POS
Antibody Screen: NEGATIVE

## 2021-01-30 LAB — PREGNANCY, URINE: Preg Test, Ur: NEGATIVE

## 2021-01-30 SURGERY — ARTHROPLASTY, HIP, TOTAL, ANTERIOR APPROACH
Anesthesia: Spinal | Site: Hip | Laterality: Left

## 2021-01-30 MED ORDER — GLYCOPYRROLATE PF 0.2 MG/ML IJ SOSY
PREFILLED_SYRINGE | INTRAMUSCULAR | Status: DC | PRN
  Administered 2021-01-30 (×2): .2 mg via INTRAVENOUS

## 2021-01-30 MED ORDER — OXYCODONE HCL 5 MG PO TABS: 5.0000 mg | ORAL_TABLET | Freq: Once | ORAL | Status: DC | PRN

## 2021-01-30 MED ORDER — IRRISEPT - 450ML BOTTLE WITH 0.05% CHG IN STERILE WATER, USP 99.95% OPTIME
TOPICAL | Status: DC | PRN
  Administered 2021-01-30: 450 mL

## 2021-01-30 MED ORDER — HYDROMORPHONE HCL 1 MG/ML IJ SOLN
1.0000 mg | INTRAMUSCULAR | Status: DC | PRN
Start: 2021-01-30 — End: 2021-01-30

## 2021-01-30 MED ORDER — LACTATED RINGERS IV SOLN: INTRAVENOUS | Status: DC

## 2021-01-30 MED ORDER — HYDROMORPHONE HCL 1 MG/ML IJ SOLN
0.2500 mg | INTRAMUSCULAR | Status: DC | PRN
  Administered 2021-01-30 (×4): 0.5 mg via INTRAVENOUS

## 2021-01-30 MED ORDER — POVIDONE-IODINE 10 % EX SWAB: 2.0000 "application " | Freq: Once | CUTANEOUS | Status: DC

## 2021-01-30 MED ORDER — MIDAZOLAM HCL 2 MG/2ML IJ SOLN
INTRAMUSCULAR | Status: AC
  Filled 2021-01-30: qty 2

## 2021-01-30 MED ORDER — HYDROCODONE-ACETAMINOPHEN 5-325 MG PO TABS
ORAL_TABLET | ORAL | Status: AC
  Filled 2021-01-30: qty 2

## 2021-01-30 MED ORDER — ONDANSETRON HCL 4 MG PO TABS: 4.0000 mg | ORAL_TABLET | Freq: Three times a day (TID) | ORAL | 0 refills | Status: DC | PRN

## 2021-01-30 MED ORDER — METHOCARBAMOL 500 MG IVPB - SIMPLE MED
500.0000 mg | Freq: Four times a day (QID) | INTRAVENOUS | Status: DC | PRN
  Administered 2021-01-30: 500 mg via INTRAVENOUS

## 2021-01-30 MED ORDER — LACTATED RINGERS IV BOLUS
500.0000 mL | Freq: Once | INTRAVENOUS | Status: AC
  Administered 2021-01-30: 500 mL via INTRAVENOUS

## 2021-01-30 MED ORDER — SODIUM CHLORIDE 0.9 % IV SOLN: INTRAVENOUS | Status: DC

## 2021-01-30 MED ORDER — ISOPROPYL ALCOHOL 70 % SOLN
Status: DC | PRN
  Administered 2021-01-30: 1 via TOPICAL

## 2021-01-30 MED ORDER — SENNA 8.6 MG PO TABS: 2.0000 | ORAL_TABLET | Freq: Every day | ORAL | 1 refills | Status: AC

## 2021-01-30 MED ORDER — SODIUM CHLORIDE (PF) 0.9 % IJ SOLN
INTRAMUSCULAR | Status: DC | PRN
  Administered 2021-01-30: 50 mL
  Administered 2021-01-30: 30 mL

## 2021-01-30 MED ORDER — PROPOFOL 500 MG/50ML IV EMUL
INTRAVENOUS | Status: DC | PRN
  Administered 2021-01-30: 100 ug/kg/min via INTRAVENOUS

## 2021-01-30 MED ORDER — KETOROLAC TROMETHAMINE 30 MG/ML IJ SOLN
INTRAMUSCULAR | Status: AC
  Filled 2021-01-30: qty 1

## 2021-01-30 MED ORDER — PROPOFOL 10 MG/ML IV BOLUS
INTRAVENOUS | Status: DC | PRN
Start: 2021-01-30 — End: 2021-01-30
  Administered 2021-01-30: 80 mg via INTRAVENOUS
  Administered 2021-01-30: 20 mg via INTRAVENOUS

## 2021-01-30 MED ORDER — HYDROMORPHONE HCL 2 MG PO TABS
ORAL_TABLET | ORAL | Status: AC
  Filled 2021-01-30: qty 1

## 2021-01-30 MED ORDER — CHLORHEXIDINE GLUCONATE 0.12 % MT SOLN
15.0000 mL | Freq: Once | OROMUCOSAL | Status: AC
  Administered 2021-01-30: 15 mL via OROMUCOSAL

## 2021-01-30 MED ORDER — ACETAMINOPHEN 10 MG/ML IV SOLN
1000.0000 mg | Freq: Once | INTRAVENOUS | Status: AC
  Administered 2021-01-30: 1000 mg via INTRAVENOUS
  Filled 2021-01-30: qty 100

## 2021-01-30 MED ORDER — MIDAZOLAM HCL 2 MG/2ML IJ SOLN
INTRAMUSCULAR | Status: DC | PRN
  Administered 2021-01-30: 2 mg via INTRAVENOUS

## 2021-01-30 MED ORDER — BUPIVACAINE-EPINEPHRINE 0.25% -1:200000 IJ SOLN
INTRAMUSCULAR | Status: DC | PRN
  Administered 2021-01-30: 30 mL

## 2021-01-30 MED ORDER — ONDANSETRON HCL 4 MG/2ML IJ SOLN
INTRAMUSCULAR | Status: AC
  Filled 2021-01-30: qty 2

## 2021-01-30 MED ORDER — BUPIVACAINE-EPINEPHRINE (PF) 0.25% -1:200000 IJ SOLN
INTRAMUSCULAR | Status: AC
  Filled 2021-01-30: qty 30

## 2021-01-30 MED ORDER — FENTANYL CITRATE (PF) 100 MCG/2ML IJ SOLN
INTRAMUSCULAR | Status: DC | PRN
  Administered 2021-01-30 (×2): 25 ug via INTRAVENOUS
  Administered 2021-01-30: 50 ug via INTRAVENOUS

## 2021-01-30 MED ORDER — SODIUM CHLORIDE (PF) 0.9 % IJ SOLN
INTRAMUSCULAR | Status: AC
  Filled 2021-01-30: qty 30

## 2021-01-30 MED ORDER — CEFAZOLIN SODIUM-DEXTROSE 2-4 GM/100ML-% IV SOLN
2.0000 g | Freq: Four times a day (QID) | INTRAVENOUS | Status: DC
Start: 2021-01-30 — End: 2021-01-30

## 2021-01-30 MED ORDER — KETOROLAC TROMETHAMINE 30 MG/ML IJ SOLN
INTRAMUSCULAR | Status: DC | PRN
  Administered 2021-01-30: 30 mg

## 2021-01-30 MED ORDER — HYDROMORPHONE HCL 2 MG PO TABS: 2.0000 mg | ORAL_TABLET | ORAL | 0 refills | Status: AC | PRN

## 2021-01-30 MED ORDER — PHENYLEPHRINE HCL-NACL 20-0.9 MG/250ML-% IV SOLN
INTRAVENOUS | Status: DC | PRN
  Administered 2021-01-30: 50 ug/min via INTRAVENOUS

## 2021-01-30 MED ORDER — CEFAZOLIN SODIUM-DEXTROSE 2-4 GM/100ML-% IV SOLN
2.0000 g | INTRAVENOUS | Status: AC
  Administered 2021-01-30: 2 g via INTRAVENOUS
  Filled 2021-01-30: qty 100

## 2021-01-30 MED ORDER — HYDROMORPHONE HCL 2 MG PO TABS
2.0000 mg | ORAL_TABLET | ORAL | Status: DC | PRN
  Administered 2021-01-30: 2 mg via ORAL

## 2021-01-30 MED ORDER — METHOCARBAMOL 500 MG PO TABS: 500.0000 mg | ORAL_TABLET | Freq: Four times a day (QID) | ORAL | Status: DC | PRN

## 2021-01-30 MED ORDER — STERILE WATER FOR IRRIGATION IR SOLN
Status: DC | PRN
  Administered 2021-01-30: 2000 mL

## 2021-01-30 MED ORDER — ORAL CARE MOUTH RINSE: 15.0000 mL | Freq: Once | OROMUCOSAL | Status: AC

## 2021-01-30 MED ORDER — DEXMEDETOMIDINE (PRECEDEX) IN NS 20 MCG/5ML (4 MCG/ML) IV SYRINGE
PREFILLED_SYRINGE | INTRAVENOUS | Status: DC | PRN
  Administered 2021-01-30: 12 ug via INTRAVENOUS
  Administered 2021-01-30: 8 ug via INTRAVENOUS

## 2021-01-30 MED ORDER — DOCUSATE SODIUM 100 MG PO CAPS: 100.0000 mg | ORAL_CAPSULE | Freq: Two times a day (BID) | ORAL | 1 refills | Status: AC

## 2021-01-30 MED ORDER — LACTATED RINGERS IV BOLUS
250.0000 mL | Freq: Once | INTRAVENOUS | Status: AC
  Administered 2021-01-30: 250 mL via INTRAVENOUS

## 2021-01-30 MED ORDER — HYDROMORPHONE HCL 2 MG PO TABS
ORAL_TABLET | ORAL | Status: AC
  Administered 2021-01-30: 2 mg via ORAL
  Filled 2021-01-30: qty 1

## 2021-01-30 MED ORDER — HYDROMORPHONE HCL 1 MG/ML IJ SOLN
INTRAMUSCULAR | Status: AC
  Filled 2021-01-30: qty 2

## 2021-01-30 MED ORDER — ASPIRIN 81 MG PO CHEW: 81.0000 mg | CHEWABLE_TABLET | Freq: Two times a day (BID) | ORAL | 0 refills | Status: AC

## 2021-01-30 MED ORDER — HYDROCODONE-ACETAMINOPHEN 5-325 MG PO TABS: 1.0000 | ORAL_TABLET | ORAL | Status: DC | PRN

## 2021-01-30 MED ORDER — ONDANSETRON HCL 4 MG/2ML IJ SOLN
INTRAMUSCULAR | Status: DC | PRN
  Administered 2021-01-30: 4 mg via INTRAVENOUS

## 2021-01-30 MED ORDER — DEXAMETHASONE SODIUM PHOSPHATE 10 MG/ML IJ SOLN
INTRAMUSCULAR | Status: AC
  Filled 2021-01-30: qty 1

## 2021-01-30 MED ORDER — TRANEXAMIC ACID-NACL 1000-0.7 MG/100ML-% IV SOLN: 1000.0000 mg | Freq: Once | INTRAVENOUS | Status: DC

## 2021-01-30 MED ORDER — SODIUM CHLORIDE 0.9 % IR SOLN
Status: DC | PRN
  Administered 2021-01-30: 1000 mL

## 2021-01-30 MED ORDER — OXYCODONE HCL 5 MG/5ML PO SOLN: 5.0000 mg | Freq: Once | ORAL | Status: DC | PRN

## 2021-01-30 MED ORDER — METHOCARBAMOL 500 MG IVPB - SIMPLE MED
INTRAVENOUS | Status: AC
  Filled 2021-01-30: qty 50

## 2021-01-30 MED ORDER — ONDANSETRON HCL 4 MG/2ML IJ SOLN: 4.0000 mg | Freq: Once | INTRAMUSCULAR | Status: DC | PRN

## 2021-01-30 MED ORDER — FENTANYL CITRATE (PF) 100 MCG/2ML IJ SOLN
INTRAMUSCULAR | Status: AC
  Filled 2021-01-30: qty 2

## 2021-01-30 MED ORDER — DEXAMETHASONE SODIUM PHOSPHATE 10 MG/ML IJ SOLN
INTRAMUSCULAR | Status: DC | PRN
  Administered 2021-01-30: 8 mg via INTRAVENOUS

## 2021-01-30 MED ORDER — PHENYLEPHRINE HCL (PRESSORS) 10 MG/ML IV SOLN
INTRAVENOUS | Status: AC
  Filled 2021-01-30: qty 2

## 2021-01-30 MED ORDER — POVIDONE-IODINE 10 % EX SWAB
2.0000 "application " | Freq: Once | CUTANEOUS | Status: AC
  Administered 2021-01-30: 2 via TOPICAL

## 2021-01-30 MED ORDER — 0.9 % SODIUM CHLORIDE (POUR BTL) OPTIME
TOPICAL | Status: DC | PRN
  Administered 2021-01-30: 1000 mL

## 2021-01-30 MED ORDER — TRANEXAMIC ACID-NACL 1000-0.7 MG/100ML-% IV SOLN
1000.0000 mg | INTRAVENOUS | Status: AC
  Administered 2021-01-30: 1000 mg via INTRAVENOUS
  Filled 2021-01-30: qty 100

## 2021-01-30 SURGICAL SUPPLY — 62 items
ADH SKN CLS APL DERMABOND .7 (GAUZE/BANDAGES/DRESSINGS) ×1
APL PRP STRL LF DISP 70% ISPRP (MISCELLANEOUS) ×1
BAG COUNTER SPONGE SURGICOUNT (BAG) IMPLANT
BAG DECANTER FOR FLEXI CONT (MISCELLANEOUS) IMPLANT
BAG SPEC THK2 15X12 ZIP CLS (MISCELLANEOUS)
BAG SPNG CNTER NS LX DISP (BAG)
BAG ZIPLOCK 12X15 (MISCELLANEOUS) IMPLANT
BALL HIP CERAMIC (Hips) IMPLANT
CHLORAPREP W/TINT 26 (MISCELLANEOUS) ×2 IMPLANT
COVER PERINEAL POST (MISCELLANEOUS) ×2 IMPLANT
COVER SURGICAL LIGHT HANDLE (MISCELLANEOUS) ×2 IMPLANT
DECANTER SPIKE VIAL GLASS SM (MISCELLANEOUS) ×2 IMPLANT
DERMABOND ADVANCED (GAUZE/BANDAGES/DRESSINGS) ×1
DERMABOND ADVANCED .7 DNX12 (GAUZE/BANDAGES/DRESSINGS) ×2 IMPLANT
DRAPE IMP U-DRAPE 54X76 (DRAPES) ×2 IMPLANT
DRAPE SHEET LG 3/4 BI-LAMINATE (DRAPES) ×6 IMPLANT
DRAPE STERI IOBAN 125X83 (DRAPES) ×2 IMPLANT
DRAPE U-SHAPE 47X51 STRL (DRAPES) ×4 IMPLANT
DRSG AQUACEL AG ADV 3.5X10 (GAUZE/BANDAGES/DRESSINGS) ×2 IMPLANT
ELECT REM PT RETURN 15FT ADLT (MISCELLANEOUS) ×2 IMPLANT
GAUZE SPONGE 4X4 12PLY STRL (GAUZE/BANDAGES/DRESSINGS) ×2 IMPLANT
GLOVE SRG 8 PF TXTR STRL LF DI (GLOVE) ×1 IMPLANT
GLOVE SURG ENC MOIS LTX SZ8.5 (GLOVE) ×4 IMPLANT
GLOVE SURG ENC TEXT LTX SZ7.5 (GLOVE) ×4 IMPLANT
GLOVE SURG UNDER POLY LF SZ8 (GLOVE) ×2
GLOVE SURG UNDER POLY LF SZ8.5 (GLOVE) ×2 IMPLANT
GOWN SPEC L3 XXLG W/TWL (GOWN DISPOSABLE) ×2 IMPLANT
GOWN STRL REUS W/TWL XL LVL3 (GOWN DISPOSABLE) ×2 IMPLANT
HANDPIECE INTERPULSE COAX TIP (DISPOSABLE) ×2
HIP BALL CERAMIC (Hips) ×2 IMPLANT
HOLDER FOLEY CATH W/STRAP (MISCELLANEOUS) ×2 IMPLANT
HOOD PEEL AWAY FLYTE STAYCOOL (MISCELLANEOUS) ×8 IMPLANT
JET LAVAGE IRRISEPT WOUND (IRRIGATION / IRRIGATOR) ×2
LAVAGE JET IRRISEPT WOUND (IRRIGATION / IRRIGATOR) ×1 IMPLANT
LINER PINN ACET NEUT 32X52 (Liner) ×1 IMPLANT
MANIFOLD NEPTUNE II (INSTRUMENTS) ×2 IMPLANT
MARKER SKIN DUAL TIP RULER LAB (MISCELLANEOUS) ×2 IMPLANT
NDL SAFETY ECLIPSE 18X1.5 (NEEDLE) ×1 IMPLANT
NDL SPNL 18GX3.5 QUINCKE PK (NEEDLE) ×1 IMPLANT
NEEDLE HYPO 18GX1.5 SHARP (NEEDLE) ×2
NEEDLE SPNL 18GX3.5 QUINCKE PK (NEEDLE) ×2 IMPLANT
PACK ANTERIOR HIP CUSTOM (KITS) ×2 IMPLANT
PENCIL SMOKE EVACUATOR (MISCELLANEOUS) IMPLANT
PIN SECTOR W/GRIP ACE CUP 52MM (Hips) ×1 IMPLANT
SAW OSC TIP CART 19.5X105X1.3 (SAW) ×2 IMPLANT
SEALER BIPOLAR AQUA 6.0 (INSTRUMENTS) ×2 IMPLANT
SET HNDPC FAN SPRY TIP SCT (DISPOSABLE) ×1 IMPLANT
STAPLER INSORB 30 2030 C-SECTI (MISCELLANEOUS) ×1 IMPLANT
STEM TRI LOC BPS SZ7 W GRIPTON (Hips) IMPLANT
SUT MNCRL AB 3-0 PS2 18 (SUTURE) ×3 IMPLANT
SUT MNCRL AB 4-0 PS2 18 (SUTURE) ×2 IMPLANT
SUT MON AB 2-0 CT1 36 (SUTURE) ×4 IMPLANT
SUT STRATAFIX PDO 1 14 VIOLET (SUTURE) ×2
SUT STRATFX PDO 1 14 VIOLET (SUTURE) ×1
SUT VIC AB 2-0 CT1 27 (SUTURE) ×2
SUT VIC AB 2-0 CT1 TAPERPNT 27 (SUTURE) ×1 IMPLANT
SUTURE STRATFX PDO 1 14 VIOLET (SUTURE) ×1 IMPLANT
SYR 3ML LL SCALE MARK (SYRINGE) ×2 IMPLANT
TRAY FOLEY MTR SLVR 16FR STAT (SET/KITS/TRAYS/PACK) IMPLANT
TRI LOC BPS SZ 7 W GRIPTON (Hips) ×2 IMPLANT
TUBE SUCTION HIGH CAP CLEAR NV (SUCTIONS) ×2 IMPLANT
WATER STERILE IRR 1000ML POUR (IV SOLUTION) ×2 IMPLANT
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
## 2021-01-30 NOTE — Transfer of Care (Signed)
Immediate Anesthesia Transfer of Care Note  Patient: Marissa Smith  Procedure(s) Performed: TOTAL HIP ARTHROPLASTY ANTERIOR APPROACH (Left: Hip)  Patient Location: PACU  Anesthesia Type:Spinal and MAC combined with regional for post-op pain  Level of Consciousness: awake, alert , oriented and patient cooperative  Airway & Oxygen Therapy: Patient Spontanous Breathing and Patient connected to face mask oxygen  Post-op Assessment: Report given to RN and Post -op Vital signs reviewed and stable  Post vital signs: Reviewed and stable  Last Vitals:  Vitals Value Taken Time  BP 111/60 01/30/21 1236  Temp    Pulse 71 01/30/21 1239  Resp 10 01/30/21 1239  SpO2 100 % 01/30/21 1239  Vitals shown include unvalidated device data.  Last Pain:  Vitals:   01/30/21 0854  TempSrc:   PainSc: 6          Complications: No notable events documented.

## 2021-01-30 NOTE — Interval H&P Note (Signed)
History and Physical Interval Note:  01/30/2021 9:27 AM  Marissa Smith  has presented today for surgery, with the diagnosis of left hip post traumatic osteoarthritis.  The various methods of treatment have been discussed with the patient and family. After consideration of risks, benefits and other options for treatment, the patient has consented to  Procedure(s): TOTAL HIP ARTHROPLASTY ANTERIOR APPROACH (Left) as a surgical intervention.  The patient's history has been reviewed, patient examined, no change in status, stable for surgery.  I have reviewed the patient's chart and labs.  Questions were answered to the patient's satisfaction.    The risks, benefits, and alternatives were discussed with the patient. There are risks associated with the surgery including, but not limited to, problems with anesthesia (death), infection, instability (giving out of the joint), dislocation, differences in leg length/angulation/rotation, fracture of bones, loosening or failure of implants, hematoma (blood accumulation) which Brazel require surgical drainage, blood clots, pulmonary embolism, nerve injury (foot drop and lateral thigh numbness), and blood vessel injury. The patient understands these risks and elects to proceed.    Iline Oven Ahmar Pickrell
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
## 2021-01-30 NOTE — Discharge Instructions (Signed)
? ?Dr. Zakyra Kukuk ?Joint Replacement Specialist ?[facility] ?[address] ?[place], [place] [address] ?[phone] ? ? ?TOTAL HIP REPLACEMENT POSTOPERATIVE DIRECTIONS ? ? ? ?Hip Rehabilitation, Guidelines Following Surgery  ? ?WEIGHT BEARING ?Weight bearing as tolerated with assist device (walker, cane, etc) as directed, use it as long as suggested by your surgeon or therapist, typically at least 4-6 weeks. ? ?The results of a hip operation are greatly improved after range of motion and muscle strengthening exercises. Follow all safety measures which are given to protect your hip. If any of these exercises cause increased pain or swelling in your joint, decrease the amount until you are comfortable again. Then slowly increase the exercises. Call your caregiver if you have problems or questions.  ? ?HOME CARE INSTRUCTIONS  ?Most of the following instructions are designed to prevent the dislocation of your new hip.  ?Remove items at home which could result in a fall. This includes throw rugs or furniture in walking pathways.  ?Continue medications as instructed at time of discharge. ?You [place] have some home medications which will be placed on hold until you complete the course of blood thinner medication. ?You [place] start showering once you are discharged home. Do not remove your dressing. ?Do not put on socks or shoes without following the instructions of your caregivers.   ?Sit on chairs with arms. Use the chair arms to help push yourself up when arising.  ?Arrange for the use of a toilet seat elevator so you are not sitting low.  ?Walk with walker as instructed.  ?You Talent resume a sexual relationship in one month or when given the OK by your caregiver.  ?Use walker as long as suggested by your caregivers.  ?You Flori put full weight on your legs and walk as much as is comfortable. ?Avoid periods of inactivity such as sitting longer than an hour when not asleep. This helps prevent blood  clots.  ?You [place] return to work once you are cleared by your surgeon.  ?Do not drive a car for 6 weeks or until released by your surgeon.  ?Do not drive while taking narcotics.  ?Wear elastic stockings for two weeks following surgery during the day but you Drotar remove then at night.  ?Make sure you keep all of your appointments after your operation with all of your doctors and caregivers. You should call the office at the above phone number and make an appointment for approximately two weeks after the date of your surgery. ?Please pick up a stool softener and laxative for home use as long as you are requiring pain medications. ?ICE to the affected hip every three hours for 30 minutes at a time and then as needed for pain and swelling. Continue to use ice on the hip for pain and swelling from surgery. You Pro notice swelling that will progress down to the foot and ankle.  This is normal after surgery.  Elevate the leg when you are not up walking on it.   ?It is important for you to complete the blood thinner medication as prescribed by your doctor. ?Continue to use the breathing machine which will help keep your temperature down.  It is common for your temperature to cycle up and down following surgery, especially at night when you are not up moving around and exerting yourself.  The breathing machine keeps your lungs expanded and your temperature down. ? ?RANGE OF MOTION AND STRENGTHENING EXERCISES  ?These exercises are designed to help you   keep full movement of your hip joint. Follow your caregiver's or physical therapist's instructions. Perform all exercises about fifteen times, three times per day or as directed. Exercise both hips, even if you have had only one joint replacement. These exercises can be done on a training (exercise) mat, on the floor, on a table or on a bed. Use whatever works the best and is most comfortable for you. Use music or television while you are exercising so that the exercises are a  pleasant break in your day. This will make your life better with the exercises acting as a break in routine you can look forward to.  ?Lying on your back, slowly slide your foot toward your buttocks, raising your knee up off the floor. Then slowly slide your foot back down until your leg is straight again.  ?Lying on your back spread your legs as far apart as you can without causing discomfort.  ?Lying on your side, raise your upper leg and foot straight up from the floor as far as is comfortable. Slowly lower the leg and repeat.  ?Lying on your back, tighten up the muscle in the front of your thigh (quadriceps muscles). You can do this by keeping your leg straight and trying to raise your heel off the floor. This helps strengthen the largest muscle supporting your knee.  ?Lying on your back, tighten up the muscles of your buttocks both with the legs straight and with the knee bent at a comfortable angle while keeping your heel on the floor.  ? ?SKILLED REHAB INSTRUCTIONS: ?If the patient is transferred to a skilled rehab facility following release from the hospital, a list of the current medications will be sent to the facility for the patient to continue.  When discharged from the skilled rehab facility, please have the facility set up the patient's Home Health Physical Therapy prior to being released. Also, the skilled facility will be responsible for providing the patient with their medications at time of release from the facility to include their pain medication and their blood thinner medication. If the patient is still at the rehab facility at time of the two week follow up appointment, the skilled rehab facility will also need to assist the patient in arranging follow up appointment in our office and any transportation needs. ? ?POST-OPERATIVE OPIOID TAPER INSTRUCTIONS: ?It is important to wean off of your opioid medication as soon as possible. If you do not need pain medication after your surgery it is ok  to stop day one. ?Opioids include: ?Codeine, Hydrocodone(Norco, Vicodin), Oxycodone(Percocet, oxycontin) and hydromorphone amongst others.  ?Long term and even short term use of opiods can cause: ?Increased pain response ?Dependence ?Constipation ?Depression ?Respiratory depression ?And more.  ?Withdrawal symptoms can include ?Flu like symptoms ?Nausea, vomiting ?And more ?Techniques to manage these symptoms ?Hydrate well ?Eat regular healthy meals ?Stay active ?Use relaxation techniques(deep breathing, meditating, yoga) ?Do Not substitute Alcohol to help with tapering ?If you have been on opioids for less than two weeks and do not have pain than it is ok to stop all together.  ?Plan to wean off of opioids ?This plan should start within one week post op of your joint replacement. ?Maintain the same interval or time between taking each dose and first decrease the dose.  ?Cut the total daily intake of opioids by one tablet each day ?Next start to increase the time between doses. ?The last dose that should be eliminated is the evening dose.  ? ? ?MAKE   SURE YOU:  ?Understand these instructions.  ?Will watch your condition.  ?Will get help right away if you are not doing well or get worse. ? ?Pick up stool softner and laxative for home use following surgery while on pain medications. ?Do not remove your dressing. ?The dressing is waterproof--it is OK to take showers. ?Continue to use ice for pain and swelling after surgery. ?Do not use any lotions or creams on the incision until instructed by your surgeon. ?Total Hip Protocol. ? ?

## 2021-01-30 NOTE — Anesthesia Postprocedure Evaluation (Signed)
Anesthesia Post Note  Patient: Marissa Smith  Procedure(s) Performed: TOTAL HIP ARTHROPLASTY ANTERIOR APPROACH (Left: Hip)     Patient location during evaluation: PACU Anesthesia Type: Spinal Level of consciousness: oriented and awake and alert Pain management: pain level controlled Vital Signs Assessment: post-procedure vital signs reviewed and stable Respiratory status: spontaneous breathing, respiratory function stable and patient connected to nasal cannula oxygen Cardiovascular status: blood pressure returned to baseline and stable Postop Assessment: no headache, no backache and no apparent nausea or vomiting Anesthetic complications: no   No notable events documented.  Last Vitals:  Vitals:   01/30/21 1300 01/30/21 1315  BP: (!) 100/53 (!) 94/59  Pulse: (!) 58 (!) 54  Resp: 15 17  Temp:    SpO2: 98% 100%    Last Pain:  Vitals:   01/30/21 1315  TempSrc:   PainSc: 6                  [name] S

## 2021-01-30 NOTE — Anesthesia Preprocedure Evaluation (Signed)
Anesthesia Evaluation  Patient identified by MRN, date of birth, ID band Patient awake    Reviewed: Allergy & Precautions, NPO status , Patient's Chart, lab work & pertinent test results  Airway Mallampati: II  TM Distance: >3 FB Neck ROM: Full    Dental no notable dental hx.    Pulmonary Current Smoker,    Pulmonary exam normal breath sounds clear to auscultation       Cardiovascular negative cardio ROS Normal cardiovascular exam Rhythm:Regular Rate:Normal     Neuro/Psych Anxiety Bipolar Disorder CRPS  Neuromuscular disease    GI/Hepatic Neg liver ROS, GERD  ,  Endo/Other  negative endocrine ROS  Renal/GU negative Renal ROS  negative genitourinary   Musculoskeletal  (+) Arthritis , Osteoarthritis,    Abdominal   Peds negative pediatric ROS (+)  Hematology negative hematology ROS (+)   Anesthesia Other Findings   Reproductive/Obstetrics negative OB ROS                             Anesthesia Physical Anesthesia Plan  ASA: 2  Anesthesia Plan: Spinal   Post-op Pain Management:    Induction: Intravenous  PONV Risk Score and Plan: 3 and Propofol infusion, Ondansetron and Dexamethasone  Airway Management Planned: Simple Face Mask  Additional Equipment:   Intra-op Plan:   Post-operative Plan:   Informed Consent: I have reviewed the patients History and Physical, chart, labs and discussed the procedure including the risks, benefits and alternatives for the proposed anesthesia with the patient or authorized representative who has indicated his/her understanding and acceptance.     Dental advisory given  Plan Discussed with: CRNA and Surgeon  Anesthesia Plan Comments:         Anesthesia Quick Evaluation

## 2021-02-03 ENCOUNTER — Ambulatory Visit: Payer: Self-pay

## 2021-02-03 NOTE — Telephone Encounter (Signed)
Mother of pt (pt is in attendance) calling to report "splotching" below the bandage. Pt's mother stated it looks bruised". Denies redness or fever.  Mother seeking advice on what it is and what to do . Pt's mother stated that she has called the office but unable to reach anyone.  Advised pt's mother that pt is under the care of Dr. Veda Canning and unable to offer advice unless he can see the affected area. Called [facility] and spoke with Velna Hatchet- call warm transferred to [place] at [place].  Reason for Disposition  [1] Caller requesting NON-URGENT health information AND [2] PCP's office is the best resource    Not PCP- Call Surgeon (Dr Veda Canning)  Protocols used: Information Only Call - No Triage-A-AH

## 2021-02-05 ENCOUNTER — Encounter (HOSPITAL_COMMUNITY): Payer: Self-pay | Admitting: Orthopedic Surgery

## 2021-05-26 ENCOUNTER — Other Ambulatory Visit: Payer: Self-pay

## 2021-05-26 ENCOUNTER — Ambulatory Visit (HOSPITAL_COMMUNITY)
Admission: EM | Admit: 2021-05-26 | Discharge: 2021-05-26 | Disposition: A | Discharge status: Home / Self Care | Payer: Medicaid Other | Attending: Emergency Medicine | Admitting: Emergency Medicine

## 2021-05-26 ENCOUNTER — Encounter (HOSPITAL_COMMUNITY): Payer: Self-pay | Admitting: Emergency Medicine

## 2021-05-26 DIAGNOSIS — J0100 Acute maxillary sinusitis, unspecified: Secondary | ICD-10-CM | POA: Diagnosis not present

## 2021-05-26 MED ORDER — ALBUTEROL SULFATE HFA 108 (90 BASE) MCG/ACT IN AERS: INHALATION_SPRAY | RESPIRATORY_TRACT | 0 refills | Status: DC

## 2021-05-26 MED ORDER — FLUTICASONE PROPIONATE 50 MCG/ACT NA SUSP: 1.0000 | Freq: Every day | NASAL | 2 refills | Status: DC

## 2021-05-26 MED ORDER — PROMETHAZINE-DM 6.25-15 MG/5ML PO SYRP: 5.0000 mL | ORAL_SOLUTION | Freq: Four times a day (QID) | ORAL | 0 refills | Status: DC | PRN

## 2021-05-26 MED ORDER — BENZONATATE 100 MG PO CAPS: 100.0000 mg | ORAL_CAPSULE | Freq: Three times a day (TID) | ORAL | 0 refills | Status: DC

## 2021-05-26 MED ORDER — AMOXICILLIN-POT CLAVULANATE 875-125 MG PO TABS: 1.0000 | ORAL_TABLET | Freq: Two times a day (BID) | ORAL | 0 refills | Status: DC

## 2021-05-26 NOTE — Discharge Instructions (Signed)
The Augmentin twice daily with food for 7 days for treatment of your sinusitis.  Perform sinus irrigation 2-3 times a day with a NeilMed sinus rinse kit and distilled water.  Do not use tap water.  You can use plain over-the-counter Mucinex every 6 hours to break up the stickiness of the mucus so your body can clear it.  Increase your oral fluid intake to thin out your mucus so that is also able for your body to clear more easily.   Use the flonase  nasal spray, as needed for nasal and sinus congestion.  Use the Tessalon Perles during the day as needed for cough.  Take 1 tablet every 8 hours with a small sip of water as needed for cough.  You [place] experience some numbness to the base of your tongue or metallic taste in her mouth, this is normal.  Use the Promethazine DM cough syrup at bedtime as needed for cough and congestion.  If you develop any new or worsening symptoms return for reevaluation or see your primary care provider.
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
## 2021-05-26 NOTE — ED Triage Notes (Signed)
Reports last week had congestion, cough, and sinus pressure. Reports now having ear pains, fullness and popping in left ear.

## 2021-06-24 ENCOUNTER — Other Ambulatory Visit: Payer: Self-pay

## 2021-06-24 ENCOUNTER — Encounter (HOSPITAL_COMMUNITY): Payer: Self-pay | Admitting: *Deleted

## 2021-06-24 ENCOUNTER — Ambulatory Visit (INDEPENDENT_AMBULATORY_CARE_PROVIDER_SITE_OTHER): Payer: Medicaid Other

## 2021-06-24 ENCOUNTER — Ambulatory Visit (HOSPITAL_COMMUNITY)
Admission: EM | Admit: 2021-06-24 | Discharge: 2021-06-24 | Disposition: A | Discharge status: Home / Self Care | Payer: Medicaid Other | Attending: Family Medicine | Admitting: Family Medicine

## 2021-06-24 DIAGNOSIS — M25571 Pain in right ankle and joints of right foot: Secondary | ICD-10-CM | POA: Diagnosis not present

## 2021-06-24 DIAGNOSIS — W19XXXA Unspecified fall, initial encounter: Secondary | ICD-10-CM

## 2021-06-24 DIAGNOSIS — G8929 Other chronic pain: Secondary | ICD-10-CM

## 2021-06-24 DIAGNOSIS — S99911A Unspecified injury of right ankle, initial encounter: Secondary | ICD-10-CM

## 2021-06-24 DIAGNOSIS — M25552 Pain in left hip: Secondary | ICD-10-CM | POA: Diagnosis not present
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
## 2021-06-24 NOTE — Discharge Instructions (Addendum)
Follow-up with Dr. Ethelene Hal pertaining to your hip and ankle. ?No acute injury seen on imaging only prior chronic healed injury present. ? ?

## 2021-10-02 ENCOUNTER — Encounter (HOSPITAL_COMMUNITY): Payer: Self-pay

## 2021-10-02 ENCOUNTER — Ambulatory Visit (INDEPENDENT_AMBULATORY_CARE_PROVIDER_SITE_OTHER): Payer: Medicaid Other

## 2021-10-02 ENCOUNTER — Ambulatory Visit (HOSPITAL_COMMUNITY)
Admission: EM | Admit: 2021-10-02 | Discharge: 2021-10-02 | Disposition: A | Discharge status: Home / Self Care | Payer: Medicaid Other | Attending: Urgent Care | Admitting: Urgent Care

## 2021-10-02 DIAGNOSIS — L089 Local infection of the skin and subcutaneous tissue, unspecified: Secondary | ICD-10-CM | POA: Diagnosis not present

## 2021-10-02 DIAGNOSIS — M79644 Pain in right finger(s): Secondary | ICD-10-CM | POA: Diagnosis not present

## 2021-10-02 DIAGNOSIS — S61001A Unspecified open wound of right thumb without damage to nail, initial encounter: Secondary | ICD-10-CM | POA: Diagnosis not present

## 2021-10-02 MED ORDER — BETAMETHASONE DIPROPIONATE 0.05 % EX OINT: TOPICAL_OINTMENT | Freq: Two times a day (BID) | CUTANEOUS | 0 refills | Status: DC

## 2021-10-02 MED ORDER — AMOXICILLIN-POT CLAVULANATE 875-125 MG PO TABS: 1.0000 | ORAL_TABLET | Freq: Two times a day (BID) | ORAL | 0 refills | Status: DC

## 2021-10-02 NOTE — ED Triage Notes (Signed)
Pt states has a cactus thorn stuck in rt thumb x2wks. States pain is worse.
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
## 2021-10-02 NOTE — Discharge Instructions (Addendum)
Please change your dressing 3-5 times daily. Do not apply any ointments or creams. Each time you change your dressing, make sure that you are pressing on the wound to get pus to come out.  Try your best to have a family member help you clean gently around the perimeter of the wound with gentle soap and warm water. Pat your wound dry and let it air out if possible to make sure it is dry before reapplying another dressing.   Start Augmentin for the infection. Use naproxen for the pain. Contact [facility] for a consultation to see if you need a procedure to open the wound.

## 2021-10-31 ENCOUNTER — Encounter (HOSPITAL_COMMUNITY): Payer: Self-pay

## 2021-10-31 ENCOUNTER — Ambulatory Visit (HOSPITAL_COMMUNITY)
Admission: EM | Admit: 2021-10-31 | Discharge: 2021-10-31 | Disposition: A | Discharge status: Home / Self Care | Payer: Medicaid Other | Attending: Nurse Practitioner | Admitting: Nurse Practitioner

## 2021-10-31 ENCOUNTER — Ambulatory Visit (INDEPENDENT_AMBULATORY_CARE_PROVIDER_SITE_OTHER): Payer: Medicaid Other

## 2021-10-31 DIAGNOSIS — S93601A Unspecified sprain of right foot, initial encounter: Secondary | ICD-10-CM | POA: Diagnosis not present

## 2021-10-31 DIAGNOSIS — M79671 Pain in right foot: Secondary | ICD-10-CM | POA: Diagnosis not present

## 2021-10-31 MED ORDER — OXYCODONE HCL 5 MG PO TABS: 5.0000 mg | ORAL_TABLET | Freq: Four times a day (QID) | ORAL | 0 refills | Status: DC | PRN

## 2021-10-31 NOTE — ED Triage Notes (Signed)
Pt reported hitting her left foot on a toy last night. She c/o left foot/ankle pain.

## 2021-10-31 NOTE — Discharge Instructions (Signed)
X-ray of your foot did not show any fractures Your injury is likely due to a sprain You have been placed in a boot and given crutches You Chianese bear weight to that right foot as tolerated Take medications as needed for pain Keep foot elevated and apply ice to reduce swelling Follow-up with orthopedics if your symptoms fail to improve over the next couple of weeks
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
## 2021-12-16 ENCOUNTER — Encounter (HOSPITAL_COMMUNITY): Payer: Self-pay | Admitting: Psychiatry

## 2021-12-16 ENCOUNTER — Other Ambulatory Visit: Payer: Self-pay

## 2021-12-16 ENCOUNTER — Emergency Department (HOSPITAL_BASED_OUTPATIENT_CLINIC_OR_DEPARTMENT_OTHER)
Admission: EM | Admit: 2021-12-16 | Discharge: 2021-12-16 | Disposition: A | Discharge status: Home / Self Care | Payer: Medicaid Other | Attending: Emergency Medicine | Admitting: Emergency Medicine

## 2021-12-16 ENCOUNTER — Ambulatory Visit (INDEPENDENT_AMBULATORY_CARE_PROVIDER_SITE_OTHER): Payer: Medicaid Other | Admitting: Psychiatry

## 2021-12-16 ENCOUNTER — Encounter (HOSPITAL_BASED_OUTPATIENT_CLINIC_OR_DEPARTMENT_OTHER): Payer: Self-pay | Admitting: Emergency Medicine

## 2021-12-16 DIAGNOSIS — F411 Generalized anxiety disorder: Secondary | ICD-10-CM

## 2021-12-16 DIAGNOSIS — F410 Panic disorder [episodic paroxysmal anxiety] without agoraphobia: Secondary | ICD-10-CM | POA: Diagnosis not present

## 2021-12-16 DIAGNOSIS — F419 Anxiety disorder, unspecified: Secondary | ICD-10-CM | POA: Diagnosis present

## 2021-12-16 DIAGNOSIS — F3181 Bipolar II disorder: Secondary | ICD-10-CM | POA: Diagnosis not present

## 2021-12-16 LAB — CBC WITH DIFFERENTIAL/PLATELET
Abs Immature Granulocytes: 0.02 10*3/uL (ref 0.00–0.07)
Basophils Absolute: 0 10*3/uL (ref 0.0–0.1)
Basophils Relative: 0 %
Eosinophils Absolute: 0.1 10*3/uL (ref 0.0–0.5)
Eosinophils Relative: 2 %
HCT: 40.5 % (ref 36.0–46.0)
Hemoglobin: 13.5 g/dL (ref 12.0–15.0)
Immature Granulocytes: 0 %
Lymphocytes Relative: 15 %
Lymphs Abs: 1 10*3/uL (ref 0.7–4.0)
MCH: 31 pg (ref 26.0–34.0)
MCHC: 33.3 g/dL (ref 30.0–36.0)
MCV: 92.9 fL (ref 80.0–100.0)
Monocytes Absolute: 0.6 10*3/uL (ref 0.1–1.0)
Monocytes Relative: 8 %
Neutro Abs: 5.2 10*3/uL (ref 1.7–7.7)
Neutrophils Relative %: 75 %
Platelets: 252 10*3/uL (ref 150–400)
RBC: 4.36 MIL/uL (ref 3.87–5.11)
RDW: 13.4 % (ref 11.5–15.5)
WBC: 7 10*3/uL (ref 4.0–10.5)
nRBC: 0 % (ref 0.0–0.2)

## 2021-12-16 LAB — TSH: TSH: 1.167 u[IU]/mL (ref 0.350–4.500)

## 2021-12-16 LAB — BASIC METABOLIC PANEL
Anion gap: 8 (ref 5–15)
BUN: 14 mg/dL (ref 6–20)
CO2: 23 mmol/L (ref 22–32)
Calcium: 8.6 mg/dL — ABNORMAL LOW (ref 8.9–10.3)
Chloride: 108 mmol/L (ref 98–111)
Creatinine, Ser: 0.66 mg/dL (ref 0.44–1.00)
GFR, Estimated: >60 mL/min (ref >60)
Glucose, Bld: 104 mg/dL — ABNORMAL HIGH (ref 70–99)
Potassium: 4.3 mmol/L (ref 3.5–5.1)
Sodium: 139 mmol/L (ref 135–145)

## 2021-12-16 MED ORDER — HYDROXYZINE HCL 25 MG PO TABS: 25.0000 mg | ORAL_TABLET | Freq: Four times a day (QID) | ORAL | 0 refills | Status: DC | PRN

## 2021-12-16 MED ORDER — LURASIDONE HCL 20 MG PO TABS: 20.0000 mg | ORAL_TABLET | Freq: Every evening | ORAL | 1 refills | Status: DC
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
## 2021-12-16 NOTE — ED Notes (Signed)
Pt stated that she felt better and had obtained a ride to [facility]

## 2021-12-16 NOTE — Discharge Instructions (Signed)
All your blood test today look normal.  Your thyroid studies just came back and they are also normal.  You can use the hydroxyzine as needed up to 4 times a day if you feel a panic attack going on.
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
## 2021-12-16 NOTE — Patient Instructions (Addendum)
Thank you for attending your appointment today.  -- START Latuda 20 mg daily once in the evening with food -- START hydoxyzine 25-50 mg up to twice a day as needed for panic attacks and at night as needed for sleep -- START melatonin 3 mg nightly for sleep  Please do not make any changes to medications without first discussing with your provider. If you are experiencing a psychiatric emergency, please call 911 or present to your nearest emergency department. Additional crisis, medication management, and therapy resources are included below.  [facility]  [address] [place], [place] [address] [phone] or [phone] [facility] 24/7 FOR ANYONE [address] [place], [place]  [phone] Fax: [phone] [email or web address] *Interpreters available *Accepts all insurance and uninsured for Urgent Care needs *Accepts Medicaid and uninsured for outpatient treatment (below)      ONLY FOR [facility]  Below:    Outpatient New Patient Assessment/Therapy Walk-ins:        Monday -Thursday 8am until slots are full.        Every Friday 1pm-4pm  (first come, first served)                   New Patient Psychiatry/Medication Management        Monday-Friday 8am-11am (first come, first served)               For all walk-ins we ask that you arrive by 7:15am, because patients will be seen in the order of arrival.

## 2021-12-16 NOTE — ED Triage Notes (Signed)
Pt arrived via EMS r/t anxiety. Pt states that she is "broken" and has been attempting to get into a BH facility for the past two weeks. Pt stated that she also woke this morning with some dizziness accompanied by more anxiety.

## 2021-12-24 ENCOUNTER — Other Ambulatory Visit: Payer: Self-pay

## 2021-12-24 ENCOUNTER — Encounter (HOSPITAL_COMMUNITY): Payer: Self-pay | Admitting: Emergency Medicine

## 2021-12-24 ENCOUNTER — Ambulatory Visit (HOSPITAL_COMMUNITY)
Admission: EM | Admit: 2021-12-24 | Discharge: 2021-12-24 | Disposition: A | Discharge status: Home / Self Care | Payer: Medicaid Other | Attending: Physician Assistant | Admitting: Physician Assistant

## 2021-12-24 ENCOUNTER — Ambulatory Visit (INDEPENDENT_AMBULATORY_CARE_PROVIDER_SITE_OTHER): Payer: Medicaid Other

## 2021-12-24 DIAGNOSIS — S62615A Displaced fracture of proximal phalanx of left ring finger, initial encounter for closed fracture: Secondary | ICD-10-CM

## 2021-12-24 NOTE — ED Triage Notes (Signed)
Patient was washing dogs.  Grabbed collar of large dog, he took off .  Pain in left ring finger.  Patient did pull finger and straightened finger.  Pain is in joint where finger meets hand.  Patient has swelling.  Reports a crunching sensation.  Patient has ice applied to wound

## 2021-12-24 NOTE — Discharge Instructions (Addendum)
  Follow up with [facility] tomorrow AM.
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
## 2021-12-24 NOTE — ED Notes (Signed)
Patient has ice on hand
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
## 2022-01-06 ENCOUNTER — Encounter (HOSPITAL_COMMUNITY): Payer: Medicaid Other | Admitting: Psychiatry

## 2022-01-30 ENCOUNTER — Other Ambulatory Visit: Payer: Self-pay

## 2022-01-30 ENCOUNTER — Emergency Department (HOSPITAL_COMMUNITY)
Admission: EM | Admit: 2022-01-30 | Discharge: 2022-01-30 | Discharge status: Against Medical Advice | Payer: Medicaid Other | Attending: Emergency Medicine | Admitting: Emergency Medicine

## 2022-01-30 ENCOUNTER — Encounter (HOSPITAL_BASED_OUTPATIENT_CLINIC_OR_DEPARTMENT_OTHER): Payer: Self-pay

## 2022-01-30 ENCOUNTER — Emergency Department (HOSPITAL_BASED_OUTPATIENT_CLINIC_OR_DEPARTMENT_OTHER): Payer: Medicaid Other | Admitting: Radiology

## 2022-01-30 DIAGNOSIS — F17200 Nicotine dependence, unspecified, uncomplicated: Secondary | ICD-10-CM | POA: Insufficient documentation

## 2022-01-30 DIAGNOSIS — J40 Bronchitis, not specified as acute or chronic: Secondary | ICD-10-CM | POA: Insufficient documentation

## 2022-01-30 DIAGNOSIS — Z5321 Procedure and treatment not carried out due to patient leaving prior to being seen by health care provider: Secondary | ICD-10-CM | POA: Diagnosis present

## 2022-01-30 LAB — BASIC METABOLIC PANEL
Anion gap: 12 (ref 5–15)
BUN: 15 mg/dL (ref 6–20)
CO2: 21 mmol/L — ABNORMAL LOW (ref 22–32)
Calcium: 9.7 mg/dL (ref 8.9–10.3)
Chloride: 104 mmol/L (ref 98–111)
Creatinine, Ser: 0.66 mg/dL (ref 0.44–1.00)
GFR, Estimated: >60 mL/min (ref >60)
Glucose, Bld: 100 mg/dL — ABNORMAL HIGH (ref 70–99)
Potassium: 3.5 mmol/L (ref 3.5–5.1)
Sodium: 137 mmol/L (ref 135–145)

## 2022-01-30 LAB — CBC
HCT: 42.2 % (ref 36.0–46.0)
Hemoglobin: 14.5 g/dL (ref 12.0–15.0)
MCH: 31.3 pg (ref 26.0–34.0)
MCHC: 34.4 g/dL (ref 30.0–36.0)
MCV: 91.1 fL (ref 80.0–100.0)
Platelets: 305 10*3/uL (ref 150–400)
RBC: 4.63 MIL/uL (ref 3.87–5.11)
RDW: 13.4 % (ref 11.5–15.5)
WBC: 7.3 10*3/uL (ref 4.0–10.5)
nRBC: 0 % (ref 0.0–0.2)

## 2022-01-30 LAB — TROPONIN I (HIGH SENSITIVITY): Troponin I (High Sensitivity): <2 ng/L (ref <18)

## 2022-01-30 LAB — PREGNANCY, URINE: Preg Test, Ur: NEGATIVE

## 2022-01-30 NOTE — ED Triage Notes (Signed)
Pt reports 3 days of L sided chest pain with radiation to L arm today.

## 2022-01-30 NOTE — ED Notes (Signed)
Pt left [facility] due to long wait times.  

## 2022-01-31 ENCOUNTER — Emergency Department (HOSPITAL_BASED_OUTPATIENT_CLINIC_OR_DEPARTMENT_OTHER)
Admission: EM | Admit: 2022-01-31 | Discharge: 2022-01-31 | Disposition: A | Discharge status: Home / Self Care | Payer: Medicaid Other | Source: Home / Self Care | Attending: Emergency Medicine | Admitting: Emergency Medicine

## 2022-01-31 DIAGNOSIS — J40 Bronchitis, not specified as acute or chronic: Secondary | ICD-10-CM

## 2022-01-31 DIAGNOSIS — R0789 Other chest pain: Secondary | ICD-10-CM

## 2022-01-31 MED ORDER — PREDNISONE 10 MG (21) PO TBPK: ORAL_TABLET | ORAL | 0 refills | Status: DC

## 2022-01-31 MED ORDER — PREDNISONE 50 MG PO TABS
60.0000 mg | ORAL_TABLET | Freq: Once | ORAL | Status: AC
  Administered 2022-01-31: 60 mg via ORAL
  Filled 2022-01-31: qty 1

## 2022-01-31 NOTE — ED Provider Notes (Signed)
[facility]  Provider Note  CSN: [id number] Arrival date & time: 01/30/22 2212  History Chief Complaint  Patient presents with   Chest Pain    Marissa Smith is a 36 y.o. female reports 3 days of intermittent L chest pain radiating into L axilla, has been constant since about 6pm the day of arrival. Associated with dry cough, no fever. Similar to when she had pneumonia in the past. She reports she's been coughing for about 2 weeks. She smokes and vapes. Denies any ACS, HTN or DM. Father had CAD at a young age.    Home Medications Prior to Admission medications   Medication Sig Start Date End Date Taking? Authorizing Provider  predniSONE (STERAPRED UNI-PAK 21 TAB) 10 MG (21) TBPK tablet 10mg  Tabs, 6 day taper. Use as directed 01/31/22  Yes Truddie Hidden, MD  acetaminophen (TYLENOL) 325 MG tablet Take 2 tablets (650 mg total) by mouth every 6 (six) hours as needed. 05/09/20   Angiulli, Lavon Paganini, PA-C  albuterol (VENTOLIN HFA) 108 (90 Base) MCG/ACT inhaler INHALE 1-2 PUFFS INTO THE LUNGS EVERY SIX HOURS AS NEEDED FOR WHEEZING OR SHORTNESS OF BREATH. 05/26/21 05/26/22  Hans Eden, NP  Ascorbic Acid (VITAMIN C PO) Take 1 tablet by mouth daily.    [provider]  betamethasone dipropionate (DIPROLENE) 0.05 % ointment Apply topically 2 (two) times daily. 10/02/21   Jaynee Eagles, PA-C  fluticasone (FLONASE) 50 MCG/ACT nasal spray Place 1 spray into both nostrils daily. 05/26/21   White, Leitha Schuller, NP  loratadine (CLARITIN) 10 MG tablet Take 1 tablet (10 mg total) by mouth daily. 01/17/19   Shelly Bombard, MD  lurasidone (LATUDA) 20 MG TABS tablet Take 1 tablet (20 mg total) by mouth every evening. Take with food. 12/16/21 02/14/22  Bahraini, Sarah A  ondansetron (ZOFRAN) 4 MG tablet Take 1 tablet (4 mg total) by mouth every 8 (eight) hours as needed for nausea or vomiting. 01/30/21   Swinteck, Aaron Edelman, MD  enoxaparin (LOVENOX) 30 MG/0.3ML injection Plan  is for Lovenox 30 mg every 12 hours through 05/16/2020 and stop 05/09/20 06/10/20  Angiulli, Lavon Paganini, PA-C     Allergies    Darvocet [propoxyphene n-acetaminophen], Hydromorphone, Miconazole nitrate, and Tramadol   Review of Systems   Review of Systems Please see HPI for pertinent positives and negatives  Physical Exam BP 120/78 (BP Location: Right Arm)   Pulse 79   Temp 98.1 F (36.7 C)   Resp 15   Ht 5\' 4"  (1.626 m)   Wt 79.8 kg   LMP 01/25/2022 (Approximate)   SpO2 99%   BMI 30.21 kg/m   Physical Exam Vitals and nursing note reviewed.  Constitutional:      Appearance: Normal appearance.  HENT:     Head: Normocephalic and atraumatic.     Nose: Nose normal.     Mouth/Throat:     Mouth: Mucous membranes are moist.  Eyes:     Extraocular Movements: Extraocular movements intact.     Conjunctiva/sclera: Conjunctivae normal.  Cardiovascular:     Rate and Rhythm: Normal rate.  Pulmonary:     Effort: Pulmonary effort is normal.     Breath sounds: Normal breath sounds.  Chest:     Chest wall: Tenderness (left lateral chest) present.  Abdominal:     General: Abdomen is flat.     Palpations: Abdomen is soft.     Tenderness: There is no abdominal tenderness.  Musculoskeletal:  General: No swelling. Normal range of motion.     Cervical back: Neck supple.  Skin:    General: Skin is warm and dry.  Neurological:     General: No focal deficit present.     Mental Status: She is alert.  Psychiatric:        Mood and Affect: Mood normal.     ED Results / Procedures / Treatments   EKG EKG Interpretation  Date/Time:  Friday January 30 2022 22:20:50 EDT Ventricular Rate:  65 PR Interval:  122 QRS Duration: 84 QT Interval:  420 QTC Calculation: 436 R Axis:   74 Text Interpretation: Normal sinus rhythm Normal ECG When compared with ECG of 12-Feb-2020 10:10, No significant change was found Confirmed by Susy Frizzle [phone]) on 01/31/2022 4:43:20  AM  Procedures Procedures  Medications Ordered in the ED Medications  predniSONE (DELTASONE) tablet 60 mg (has no administration in time range)    Initial Impression and Plan  Patient here with atypical, pleuritic and reproducible chest pain. Labs done in triage show normal CBC, BMP and Trop. Given duration of her symptoms a single trop is sufficient to rule out ACS. I personally viewed the images from radiology studies and agree with radiologist interpretation: CXR is clear. Will plan Rx for Prednisone to help with her cough, likely bronchitis and causing her chest to hurt. Recommend close outpatient follow up, RTED if she is worsening. Stop smoking/vaping.    ED Course       MDM Rules/Calculators/A&P Medical Decision Making Given presenting complaint, I considered that admission might be necessary. After review of results from ED lab and/or imaging studies, admission to the hospital is not indicated at this time.    Problems Addressed: Atypical chest pain: acute illness or injury Bronchitis: acute illness or injury  Amount and/or Complexity of Data Reviewed Labs: ordered. Decision-making details documented in ED Course. Radiology: ordered and independent interpretation performed. Decision-making details documented in ED Course. ECG/medicine tests: ordered and independent interpretation performed. Decision-making details documented in ED Course.  Risk Prescription drug management. Decision regarding hospitalization.    Final Clinical Impression(s) / ED Diagnoses Final diagnoses:  Atypical chest pain  Bronchitis    Rx / DC Orders ED Discharge Orders          Ordered    predniSONE (STERAPRED UNI-PAK 21 TAB) 10 MG (21) TBPK tablet        01/31/22 0501             Pollyann Savoy, MD 01/31/22 0502
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
## 2022-07-13 ENCOUNTER — Encounter: Payer: Medicaid Other | Admitting: Obstetrics

## 2022-10-02 ENCOUNTER — Ambulatory Visit (HOSPITAL_COMMUNITY)
Admission: EM | Admit: 2022-10-02 | Discharge: 2022-10-02 | Disposition: A | Discharge status: Home / Self Care | Payer: Managed Care, Other (non HMO) | Attending: Family Medicine | Admitting: Family Medicine

## 2022-10-02 ENCOUNTER — Encounter (HOSPITAL_COMMUNITY): Payer: Self-pay | Admitting: *Deleted

## 2022-10-02 ENCOUNTER — Ambulatory Visit (INDEPENDENT_AMBULATORY_CARE_PROVIDER_SITE_OTHER): Payer: Managed Care, Other (non HMO)

## 2022-10-02 ENCOUNTER — Other Ambulatory Visit (HOSPITAL_COMMUNITY): Payer: Managed Care, Other (non HMO)

## 2022-10-02 DIAGNOSIS — R079 Chest pain, unspecified: Secondary | ICD-10-CM

## 2022-10-02 DIAGNOSIS — J4520 Mild intermittent asthma, uncomplicated: Secondary | ICD-10-CM

## 2022-10-02 MED ORDER — METHYLPREDNISOLONE ACETATE 80 MG/ML IJ SUSP
80.0000 mg | Freq: Once | INTRAMUSCULAR | Status: AC
  Administered 2022-10-02: 80 mg via INTRAMUSCULAR

## 2022-10-02 MED ORDER — METHYLPREDNISOLONE ACETATE 80 MG/ML IJ SUSP
INTRAMUSCULAR | Status: AC
  Filled 2022-10-02: qty 1

## 2022-10-02 MED ORDER — ALBUTEROL SULFATE HFA 108 (90 BASE) MCG/ACT IN AERS: 2.0000 | INHALATION_SPRAY | RESPIRATORY_TRACT | 0 refills | Status: AC | PRN
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
## 2022-10-02 NOTE — Discharge Instructions (Signed)
Your chest x-ray was normal.  Your EKG was normal also  If this pain intensifies or you start feeling worse in any way, please consider going to the emergency room for further evaluation  I am glad you are going to establish with primary care for follow-up also.  Albuterol inhaler--do 2 puffs every 4 hours as needed for shortness of breath or wheezing We have given you a shot of methylprednisolone 80 mg here in the office
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
## 2022-10-27 ENCOUNTER — Ambulatory Visit: Payer: Managed Care, Other (non HMO) | Admitting: Family

## 2022-10-27 ENCOUNTER — Encounter: Payer: Self-pay | Admitting: Family

## 2022-10-27 VITALS — BP 124/68 | HR 65 | Temp 97.5°F | Ht 64.0 in | Wt 181.8 lb

## 2022-10-27 DIAGNOSIS — J3089 Other allergic rhinitis: Secondary | ICD-10-CM

## 2022-10-27 DIAGNOSIS — F17200 Nicotine dependence, unspecified, uncomplicated: Secondary | ICD-10-CM | POA: Insufficient documentation

## 2022-10-27 DIAGNOSIS — F3181 Bipolar II disorder: Secondary | ICD-10-CM

## 2022-10-27 DIAGNOSIS — J4520 Mild intermittent asthma, uncomplicated: Secondary | ICD-10-CM

## 2022-10-27 DIAGNOSIS — J45909 Unspecified asthma, uncomplicated: Secondary | ICD-10-CM | POA: Insufficient documentation

## 2022-10-27 MED ORDER — MONTELUKAST SODIUM 10 MG PO TABS: 10.0000 mg | ORAL_TABLET | Freq: Every day | ORAL | 3 refills | Status: DC

## 2022-10-27 MED ORDER — FLUTICASONE PROPIONATE 50 MCG/ACT NA SUSP: 1.0000 | Freq: Every day | NASAL | 2 refills | Status: AC
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
## 2022-10-27 NOTE — Patient Instructions (Addendum)
Welcome to [facility], It was a pleasure meeting you today!    As discussed, I have sent a Flonase refill & generic Singulair to your pharmacy to help with your sinus sx.  You can schedule a physical with fasting labs today at your convenience.    PLEASE NOTE: If you had any LAB tests please let us know if you have not heard back within a few days. You Whilden see your results on MyChart before we have a chance to review them but we will give you a call once they are reviewed by [place]. If we ordered any REFERRALS today, please let us know if you have not heard from their office within the next week.  Let us know through MyChart if you are needing REFILLS, or have your pharmacy send [place] the request. You can also use MyChart to communicate with me or any office staff.  Please try these tips to maintain a healthy lifestyle: It is important that you exercise regularly at least 30 minutes 5 times a week. Think about what you will eat, plan ahead. Choose whole foods, & think  "clean, green, fresh or frozen" over canned, processed or packaged foods which are more sugary, salty, and fatty. 70 to 75% of food eaten should be fresh vegetables and protein. 2-3  meals daily with healthy snacks between meals, but must be whole fruit, protein or vegetables. Aim to eat over a 10 hour period when you are active, for example, 7am to 5pm, and then STOP after your last meal of the day, drinking only water.  Shorter eating windows, 6-8 hours, are showing benefits in heart disease and blood sugar regulation. Drink water every day! Shoot for 64 ounces daily = 8 cups, no other drink is as healthy! Fruit juice is best enjoyed in a healthy way, by EATING the fruit.

## 2022-10-27 NOTE — Assessment & Plan Note (Signed)
chronic, unstable taking OTC Xyzal, Flonase (ran out) reports having sx all year round sending refill of Flonase sending Singulair 10mg  at bedtime f/u 6 mos or prn
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
## 2022-10-27 NOTE — Assessment & Plan Note (Signed)
chronic, unstable taking OTC Xyzal, Flonase (ran out) reports having sx all year round using Albuterol prn sending Singulair 10mg  at bedtime f/u 6 mos or prn
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
## 2022-12-08 ENCOUNTER — Encounter (HOSPITAL_COMMUNITY): Payer: Self-pay

## 2022-12-08 ENCOUNTER — Ambulatory Visit (HOSPITAL_COMMUNITY)
Admission: EM | Admit: 2022-12-08 | Discharge: 2022-12-08 | Disposition: A | Discharge status: Home / Self Care | Payer: Managed Care, Other (non HMO) | Attending: Physician Assistant | Admitting: Physician Assistant

## 2022-12-08 ENCOUNTER — Ambulatory Visit (INDEPENDENT_AMBULATORY_CARE_PROVIDER_SITE_OTHER): Payer: Managed Care, Other (non HMO)

## 2022-12-08 DIAGNOSIS — M25552 Pain in left hip: Secondary | ICD-10-CM

## 2022-12-08 DIAGNOSIS — M25562 Pain in left knee: Secondary | ICD-10-CM

## 2022-12-08 MED ORDER — KETOROLAC TROMETHAMINE 30 MG/ML IJ SOLN
30.0000 mg | Freq: Once | INTRAMUSCULAR | Status: AC
  Administered 2022-12-08: 30 mg via INTRAMUSCULAR

## 2022-12-08 MED ORDER — PREDNISONE 20 MG PO TABS: 40.0000 mg | ORAL_TABLET | Freq: Every day | ORAL | 0 refills | Status: AC

## 2022-12-08 MED ORDER — KETOROLAC TROMETHAMINE 30 MG/ML IJ SOLN
INTRAMUSCULAR | Status: AC
  Filled 2022-12-08: qty 1

## 2022-12-08 NOTE — ED Triage Notes (Addendum)
Pt reports she has been getting knee/leg cramps and swelling that is radiating up to her lower back x 1 week and has progressively gotten worse.

## 2022-12-08 NOTE — Discharge Instructions (Addendum)
Can continue with robaxin, tylenol, ibuprofen  Take prednisone as prescribed Follow up with orthopedics Can wear knee sleeve for comfort
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
## 2023-01-26 ENCOUNTER — Other Ambulatory Visit: Payer: Self-pay | Admitting: Family

## 2023-01-26 DIAGNOSIS — J3089 Other allergic rhinitis: Secondary | ICD-10-CM

## 2023-01-26 DIAGNOSIS — J4520 Mild intermittent asthma, uncomplicated: Secondary | ICD-10-CM

## 2023-07-31 ENCOUNTER — Other Ambulatory Visit: Payer: Self-pay | Admitting: Family

## 2023-07-31 DIAGNOSIS — J4520 Mild intermittent asthma, uncomplicated: Secondary | ICD-10-CM

## 2023-07-31 DIAGNOSIS — J3089 Other allergic rhinitis: Secondary | ICD-10-CM

## 2023-09-07 ENCOUNTER — Other Ambulatory Visit (HOSPITAL_COMMUNITY)
Admission: RE | Admit: 2023-09-07 | Discharge: 2023-09-07 | Disposition: A | Discharge status: Home / Self Care | Source: Ambulatory Visit | Attending: Obstetrics & Gynecology | Admitting: Obstetrics & Gynecology

## 2023-09-07 ENCOUNTER — Encounter: Payer: Self-pay | Admitting: Obstetrics & Gynecology

## 2023-09-07 ENCOUNTER — Ambulatory Visit: Admitting: Obstetrics & Gynecology

## 2023-09-07 VITALS — BP 113/73 | HR 73 | Ht 63.0 in | Wt 172.6 lb

## 2023-09-07 DIAGNOSIS — Z113 Encounter for screening for infections with a predominantly sexual mode of transmission: Secondary | ICD-10-CM

## 2023-09-07 DIAGNOSIS — Z01419 Encounter for gynecological examination (general) (routine) without abnormal findings: Secondary | ICD-10-CM | POA: Diagnosis present

## 2023-09-07 DIAGNOSIS — F3181 Bipolar II disorder: Secondary | ICD-10-CM
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
## 2023-09-08 LAB — CERVICOVAGINAL ANCILLARY ONLY
Bacterial Vaginitis (gardnerella): NEGATIVE
Candida Glabrata: NEGATIVE
Candida Vaginitis: NEGATIVE
Chlamydia: NEGATIVE
Comment: NEGATIVE
Comment: NEGATIVE
Comment: NEGATIVE
Comment: NEGATIVE
Comment: NEGATIVE
Comment: NORMAL
Neisseria Gonorrhea: NEGATIVE
Trichomonas: NEGATIVE

## 2023-09-10 LAB — HIV ANTIBODY (ROUTINE TESTING W REFLEX): HIV Screen 4th Generation wRfx: NONREACTIVE

## 2023-09-10 LAB — HEPATITIS C ANTIBODY: Hep C Virus Ab: NONREACTIVE

## 2023-09-10 LAB — HEPATITIS B SURFACE ANTIGEN: Hepatitis B Surface Ag: NEGATIVE

## 2023-09-10 LAB — RPR: RPR Ser Ql: NONREACTIVE

## 2023-09-13 LAB — CYTOLOGY - PAP
Comment: NEGATIVE
Diagnosis: UNDETERMINED — AB
High risk HPV: NEGATIVE

## 2024-02-12 ENCOUNTER — Other Ambulatory Visit: Payer: Self-pay

## 2024-02-12 ENCOUNTER — Emergency Department (HOSPITAL_BASED_OUTPATIENT_CLINIC_OR_DEPARTMENT_OTHER)
Admission: EM | Admit: 2024-02-12 | Discharge: 2024-02-12 | Disposition: A | Discharge status: Home / Self Care | Attending: Emergency Medicine | Admitting: Emergency Medicine

## 2024-02-12 ENCOUNTER — Encounter (HOSPITAL_BASED_OUTPATIENT_CLINIC_OR_DEPARTMENT_OTHER): Payer: Self-pay | Admitting: Emergency Medicine

## 2024-02-12 ENCOUNTER — Emergency Department (HOSPITAL_BASED_OUTPATIENT_CLINIC_OR_DEPARTMENT_OTHER)

## 2024-02-12 DIAGNOSIS — R634 Abnormal weight loss: Secondary | ICD-10-CM | POA: Diagnosis present

## 2024-02-12 DIAGNOSIS — R10A2 Flank pain, left side: Secondary | ICD-10-CM | POA: Diagnosis not present

## 2024-02-12 LAB — CBC
HCT: 35.4 % — ABNORMAL LOW (ref 36.0–46.0)
Hemoglobin: 12.2 g/dL (ref 12.0–15.0)
MCH: 31.4 pg (ref 26.0–34.0)
MCHC: 34.5 g/dL (ref 30.0–36.0)
MCV: 91 fL (ref 80.0–100.0)
Platelets: 241 K/uL (ref 150–400)
RBC: 3.89 MIL/uL (ref 3.87–5.11)
RDW: 13.5 % (ref 11.5–15.5)
WBC: 5.2 K/uL (ref 4.0–10.5)
nRBC: 0 % (ref 0.0–0.2)

## 2024-02-12 LAB — COMPREHENSIVE METABOLIC PANEL WITH GFR
ALT: 27 U/L (ref 0–44)
AST: 33 U/L (ref 15–41)
Albumin: 4.1 g/dL (ref 3.5–5.0)
Alkaline Phosphatase: 40 U/L (ref 38–126)
Anion gap: 10 (ref 5–15)
BUN: 13 mg/dL (ref 6–20)
CO2: 25 mmol/L (ref 22–32)
Calcium: 9.5 mg/dL (ref 8.9–10.3)
Chloride: 102 mmol/L (ref 98–111)
Creatinine, Ser: 0.65 mg/dL (ref 0.44–1.00)
GFR, Estimated: >60 mL/min (ref >60)
Glucose, Bld: 177 mg/dL — ABNORMAL HIGH (ref 70–99)
Potassium: 3.6 mmol/L (ref 3.5–5.1)
Sodium: 136 mmol/L (ref 135–145)
Total Bilirubin: 0.3 mg/dL (ref 0.0–1.2)
Total Protein: 6.6 g/dL (ref 6.5–8.1)

## 2024-02-12 LAB — HEMOGLOBIN A1C
Hgb A1c MFr Bld: 5.2 % (ref 4.8–5.6)
Mean Plasma Glucose: 102.54 mg/dL

## 2024-02-12 LAB — LIPASE, BLOOD: Lipase: 21 U/L (ref 11–51)

## 2024-02-12 LAB — TSH: TSH: 0.956 u[IU]/mL (ref 0.350–4.500)

## 2024-02-12 LAB — HCG, SERUM, QUALITATIVE: Preg, Serum: NEGATIVE

## 2024-02-12 MED ORDER — IOHEXOL 300 MG/ML  SOLN
100.0000 mL | Freq: Once | INTRAMUSCULAR | Status: AC | PRN
  Administered 2024-02-12: 100 mL via INTRAVENOUS

## 2024-02-12 NOTE — Discharge Instructions (Signed)
 Your blood work is reassuring overall although your sugar was little high.  The CT scan did show potentially a gallstone further down in the tract.  Follow-up with GI and primary care doctor.  Likely need an MRI and MRCP.

## 2024-02-12 NOTE — ED Triage Notes (Signed)
 REPORTS  weight loss since beginning of year Reports wt of 170s now weighing in at 130s Reports loss of app and texture issues  Some left side back pain x 2 months

## 2024-02-12 NOTE — ED Provider Notes (Signed)
  EMERGENCY DEPARTMENT AT [facility] Provider Note   CSN: [id number] Arrival date & time: 02/12/24  1138     Patient presents with: Weight Loss   Marissa Smith is a 38 y.o. female.   HPI  Patient presents with about 40 pounds of weight loss over the last 11 months.  Says she had stopped drinking alcohol  and has had decreased appetite over the last 6 months.  States food is just not taste good to her.  However she is worried about more pathologic cause of the weight loss.  States she has had more left flank pain.  Pain not associate with eating.  Has not had her thyroid  looked at previously.  Denies drug use but states she does vape.  Denies smoking.    Prior to Admission medications   Medication Sig Start Date End Date Taking? Authorizing Provider  albuterol  (VENTOLIN  HFA) 108 (90 Base) MCG/ACT inhaler Inhale 2 puffs into the lungs every 4 (four) hours as needed for wheezing or shortness of breath. Patient not taking: Reported on 09/07/2023 10/02/22   Vonna Sharlet POUR, MD  fluticasone  (FLONASE ) 50 MCG/ACT nasal spray Place 1 spray into both nostrils daily. Patient not taking: Reported on 09/07/2023 10/27/22   Lucius Krabbe, NP  loratadine  (CLARITIN ) 10 MG tablet Take 1 tablet (10 mg total) by mouth daily. 01/17/19   Rudy Carlin LABOR, MD  montelukast  (SINGULAIR ) 10 MG tablet TAKE 1 TABLET BY MOUTH EVERYDAY AT BEDTIME Patient not taking: Reported on 09/07/2023 08/02/23   Lucius Krabbe, NP  enoxaparin  (LOVENOX ) 30 MG/0.3ML injection Plan is for Lovenox  30 mg every 12 hours through 05/16/2020 and stop 05/09/20 06/10/20  Angiulli, Daniel J, PA-C    Allergies: Darvocet [propoxyphene n-acetaminophen ], Hydromorphone , Vagisil [benzo-resorcinol (perivaginal)], Miconazole nitrate, and Tramadol     Review of Systems  Updated Vital Signs BP 108/70   Pulse 72   Temp 98.4 F (36.9 C)   Resp 15   LMP 02/05/2024   SpO2 100%   Physical Exam Vitals and nursing note  reviewed.  HENT:     Head: Atraumatic.  Cardiovascular:     Rate and Rhythm: Regular rhythm.  Abdominal:     Tenderness: There is no abdominal tenderness.  Genitourinary:    Comments: Does have some left-sided CVA tenderness. Skin:    Capillary Refill: Capillary refill takes less than 2 seconds.  Neurological:     Mental Status: She is alert and oriented to person, place, and time.     (all labs ordered are listed, but only abnormal results are displayed) Labs Reviewed  COMPREHENSIVE METABOLIC PANEL WITH GFR - Abnormal; Notable for the following components:      Result Value   Glucose, Bld 177 (*)    All other components within normal limits  CBC - Abnormal; Notable for the following components:   HCT 35.4 (*)    All other components within normal limits  LIPASE, BLOOD  TSH  HCG, SERUM, QUALITATIVE  HEMOGLOBIN A1C    EKG: None  Radiology: CT CHEST ABDOMEN PELVIS W CONTRAST Result Date: 02/12/2024 CLINICAL DATA:  Unintentional 40 lb weight loss in past 10 months. EXAM: CT CHEST, ABDOMEN, AND PELVIS WITH CONTRAST TECHNIQUE: Multidetector CT imaging of the chest, abdomen and pelvis was performed following the standard protocol during bolus administration of intravenous contrast. RADIATION DOSE REDUCTION: This exam was performed according to the departmental dose-optimization program which includes automated exposure control, adjustment of the mA and/or kV according to patient size and/or use  of iterative reconstruction technique. CONTRAST:  OMNIPAQUE  IOHEXOL  300 MG/ML  SOLN COMPARISON:  04/13/2020 FINDINGS: CT CHEST FINDINGS Cardiovascular: No acute findings. Mediastinum/Lymph Nodes: 8 mm right thyroid  lobe nodule remains stable since previous study. No pathologically enlarged lymph nodes identified. Lungs/Pleura: No suspicious pulmonary nodules or masses identified. No evidence of infiltrate or pleural effusion. Musculoskeletal:  No suspicious bone lesions identified. CT  ABDOMEN AND PELVIS FINDINGS Hepatobiliary: No masses identified. Prior cholecystectomy. Mild diffuse biliary ductal dilatation is again seen. Increased density is seen in the area of the distal common bile duct within the pancreatic head (e.g. Image 77/2). This is suspicious for choledocholithiasis, although differential diagnosis also includes small pancreatic head mass. Pancreas: Focal lesion in the pancreatic head and distal common bile duct measuring 1.8 by 1.6 cm on image 77/2. Small pancreatic mass cannot be excluded. No evidence of pancreatitis. Spleen:  Within normal limits in size and appearance. Adrenals/Urinary tract: No suspicious masses or hydronephrosis. Stomach/Bowel: No evidence of obstruction, inflammatory process, or abnormal fluid collections. Vascular/Lymphatic: No pathologically enlarged lymph nodes identified. No acute vascular findings. Reproductive: Limited visualization noted due to severe artifact from left hip prostheses. No mass or other significant abnormality identified. Other:  Tiny fat-containing umbilical hernia. Musculoskeletal: No suspicious bone lesions identified. Left hip prosthesis and acetabular fixation screws noted. IMPRESSION: Prior cholecystectomy. Mild diffuse biliary ductal dilatation, with abnormal density in the area of the distal common bile duct and pancreatic head. This is suspicious for choledocholithiasis, although small pancreatic mass cannot be excluded. Recommend abdomen MRI and MRCP without and with contrast for further evaluation. Tiny fat-containing umbilical hernia. Electronically Signed   By: Norleen DELENA Kil M.D.   On: 02/12/2024 13:40     Procedures   Medications Ordered in the ED  iohexol  (OMNIPAQUE ) 300 MG/ML solution 100 mL (100 mLs Intravenous Contrast Given 02/12/24 1301)                                    Medical Decision Making Amount and/or Complexity of Data Reviewed Labs: ordered. Radiology: ordered.  Risk Prescription drug  management.   Patient with abdominal pain and weight loss.  Has been down 40 pounds.  Potentially could be from some lifestyle changes she had.  However I think malignancy is also in the differential.  Particular with this abdominal pain.  Will get CT scan chest abdomen pelvis to evaluate along with some blood work.  Blood work reassuring.  However glucose mildly elevated at 170.  Will add on hemoglobin A1c that can be followed by PCP.  CT scan done and does show some potential biliary dilatation.  Lab work reassuring however.  Has had previous cholecystectomy potentially could be a choledocholithiasis or mass.  Discussed with patient and will follow-up with either PCP or [facility].  Will discharge.     Final diagnoses:  Weight loss    ED Discharge Orders     None          Patsey Lot, MD 02/12/24 1451
